# Patient Record
Sex: Female | Born: 1955
Health system: Southern US, Community
[De-identification: ages and names within clinical notes are randomized; demographics above are authoritative.]

## PROBLEM LIST (undated history)

## (undated) DIAGNOSIS — IMO0001 Reserved for inherently not codable concepts without codable children: Secondary | ICD-10-CM

## (undated) DIAGNOSIS — C801 Malignant (primary) neoplasm, unspecified: Secondary | ICD-10-CM

## (undated) DIAGNOSIS — G4700 Insomnia, unspecified: Secondary | ICD-10-CM

## (undated) DIAGNOSIS — E785 Hyperlipidemia, unspecified: Secondary | ICD-10-CM

## (undated) DIAGNOSIS — E039 Hypothyroidism, unspecified: Secondary | ICD-10-CM

## (undated) DIAGNOSIS — I10 Essential (primary) hypertension: Secondary | ICD-10-CM

## (undated) DIAGNOSIS — Z860100 Personal history of colon polyps, unspecified: Secondary | ICD-10-CM

## (undated) DIAGNOSIS — Z923 Personal history of irradiation: Secondary | ICD-10-CM

## (undated) DIAGNOSIS — F329 Major depressive disorder, single episode, unspecified: Secondary | ICD-10-CM

## (undated) DIAGNOSIS — Z531 Procedure and treatment not carried out because of patient's decision for reasons of belief and group pressure: Secondary | ICD-10-CM

## (undated) DIAGNOSIS — M1990 Unspecified osteoarthritis, unspecified site: Secondary | ICD-10-CM

## (undated) DIAGNOSIS — F32A Depression, unspecified: Secondary | ICD-10-CM

## (undated) DIAGNOSIS — F419 Anxiety disorder, unspecified: Secondary | ICD-10-CM

## (undated) HISTORY — PX: PLANTAR FASCIA SURGERY: SHX746

## (undated) HISTORY — PX: TENDON RELEASE: SHX230

## (undated) HISTORY — PX: COLONOSCOPY: SHX174

## (undated) HISTORY — PX: KNEE ARTHROSCOPY: SHX127

## (undated) HISTORY — DX: Hypomagnesemia: E83.42

## (undated) HISTORY — PX: DILATION AND CURETTAGE OF UTERUS: SHX78

## (undated) HISTORY — PX: CHOLECYSTECTOMY: SHX55

---

## 2002-02-02 ENCOUNTER — Other Ambulatory Visit: Admission: RE | Admit: 2002-02-02 | Discharge: 2002-02-02 | Payer: Self-pay | Admitting: Family Medicine

## 2002-02-24 ENCOUNTER — Encounter: Admission: RE | Admit: 2002-02-24 | Discharge: 2002-02-24 | Payer: Self-pay | Admitting: Family Medicine

## 2002-02-24 ENCOUNTER — Encounter: Payer: Self-pay | Admitting: Family Medicine

## 2002-04-27 ENCOUNTER — Encounter (INDEPENDENT_AMBULATORY_CARE_PROVIDER_SITE_OTHER): Payer: Self-pay | Admitting: Specialist

## 2002-04-27 ENCOUNTER — Ambulatory Visit (HOSPITAL_COMMUNITY): Admission: RE | Admit: 2002-04-27 | Discharge: 2002-04-27 | Payer: Self-pay | Admitting: Obstetrics and Gynecology

## 2003-12-08 ENCOUNTER — Ambulatory Visit: Payer: Self-pay | Admitting: Family Medicine

## 2003-12-13 ENCOUNTER — Ambulatory Visit: Payer: Self-pay | Admitting: Family Medicine

## 2003-12-13 ENCOUNTER — Other Ambulatory Visit: Admission: RE | Admit: 2003-12-13 | Discharge: 2003-12-13 | Payer: Self-pay | Admitting: Family Medicine

## 2004-01-21 ENCOUNTER — Ambulatory Visit: Payer: Self-pay | Admitting: Family Medicine

## 2004-06-20 ENCOUNTER — Emergency Department (HOSPITAL_COMMUNITY): Admission: EM | Admit: 2004-06-20 | Discharge: 2004-06-21 | Payer: Self-pay | Admitting: Emergency Medicine

## 2005-09-15 ENCOUNTER — Emergency Department (HOSPITAL_COMMUNITY): Admission: EM | Admit: 2005-09-15 | Discharge: 2005-09-15 | Payer: Self-pay | Admitting: Emergency Medicine

## 2005-09-24 ENCOUNTER — Encounter (INDEPENDENT_AMBULATORY_CARE_PROVIDER_SITE_OTHER): Payer: Self-pay | Admitting: *Deleted

## 2005-09-24 ENCOUNTER — Ambulatory Visit (HOSPITAL_COMMUNITY): Admission: RE | Admit: 2005-09-24 | Discharge: 2005-09-25 | Payer: Self-pay | Admitting: Surgery

## 2005-10-26 ENCOUNTER — Emergency Department (HOSPITAL_COMMUNITY): Admission: EM | Admit: 2005-10-26 | Discharge: 2005-10-26 | Payer: Self-pay | Admitting: Emergency Medicine

## 2006-03-05 ENCOUNTER — Ambulatory Visit: Payer: Self-pay | Admitting: Internal Medicine

## 2006-03-18 ENCOUNTER — Ambulatory Visit: Payer: Self-pay | Admitting: Internal Medicine

## 2006-03-20 ENCOUNTER — Encounter: Admission: RE | Admit: 2006-03-20 | Discharge: 2006-03-20 | Payer: Self-pay | Admitting: Family Medicine

## 2007-11-23 ENCOUNTER — Emergency Department (HOSPITAL_BASED_OUTPATIENT_CLINIC_OR_DEPARTMENT_OTHER): Admission: EM | Admit: 2007-11-23 | Discharge: 2007-11-23 | Payer: Self-pay | Admitting: Emergency Medicine

## 2008-01-12 ENCOUNTER — Emergency Department (HOSPITAL_BASED_OUTPATIENT_CLINIC_OR_DEPARTMENT_OTHER): Admission: EM | Admit: 2008-01-12 | Discharge: 2008-01-12 | Payer: Self-pay | Admitting: Emergency Medicine

## 2008-01-12 ENCOUNTER — Ambulatory Visit: Payer: Self-pay | Admitting: Diagnostic Radiology

## 2008-01-27 ENCOUNTER — Encounter: Admission: RE | Admit: 2008-01-27 | Discharge: 2008-01-27 | Payer: Self-pay | Admitting: Family Medicine

## 2008-08-04 ENCOUNTER — Encounter (INDEPENDENT_AMBULATORY_CARE_PROVIDER_SITE_OTHER): Payer: Self-pay | Admitting: Obstetrics and Gynecology

## 2008-08-04 ENCOUNTER — Ambulatory Visit (HOSPITAL_COMMUNITY): Admission: RE | Admit: 2008-08-04 | Discharge: 2008-08-04 | Payer: Self-pay | Admitting: Obstetrics and Gynecology

## 2009-02-09 ENCOUNTER — Encounter: Admission: RE | Admit: 2009-02-09 | Discharge: 2009-02-09 | Payer: Self-pay | Admitting: Obstetrics and Gynecology

## 2009-08-16 ENCOUNTER — Ambulatory Visit (HOSPITAL_BASED_OUTPATIENT_CLINIC_OR_DEPARTMENT_OTHER): Admission: RE | Admit: 2009-08-16 | Discharge: 2009-08-16 | Payer: Self-pay | Admitting: Orthopedic Surgery

## 2010-04-08 LAB — CBC
MCHC: 34.1 g/dL (ref 30.0–36.0)
Platelets: 195 10*3/uL (ref 150–400)
RBC: 3.73 MIL/uL — ABNORMAL LOW (ref 3.87–5.11)
RDW: 16.1 % — ABNORMAL HIGH (ref 11.5–15.5)

## 2010-04-17 LAB — BASIC METABOLIC PANEL
GFR calc non Af Amer: 47 mL/min — ABNORMAL LOW (ref >60)
Glucose, Bld: 91 mg/dL (ref 70–99)
Potassium: 3.8 mEq/L (ref 3.5–5.1)
Sodium: 136 mEq/L (ref 135–145)

## 2010-04-17 LAB — DIFFERENTIAL
Lymphocytes Relative: 24 % (ref 12–46)
Lymphs Abs: 1.6 10*3/uL (ref 0.7–4.0)
Monocytes Absolute: 0.6 10*3/uL (ref 0.1–1.0)
Monocytes Relative: 8 % (ref 3–12)
Neutro Abs: 4.4 10*3/uL (ref 1.7–7.7)
Neutrophils Relative %: 66 % (ref 43–77)

## 2010-04-17 LAB — CBC
Hemoglobin: 13.2 g/dL (ref 12.0–15.0)
RBC: 4.37 MIL/uL (ref 3.87–5.11)
WBC: 6.7 10*3/uL (ref 4.0–10.5)

## 2010-05-16 NOTE — Op Note (Signed)
NAMESRESHTA, CRESSLER                ACCOUNT NO.:  [id number]   MEDICAL RECORD NO.:  [id number]          PATIENT TYPE:  AMB   LOCATION:  SDC                           FACILITY:  [facility]   PHYSICIAN:  Carrington Clamp, M.D. DATE OF BIRTH:  [birth year]   DATE OF PROCEDURE:  08/04/2008  DATE OF DISCHARGE:                               OPERATIVE REPORT   PREOPERATIVE DIAGNOSIS:  Postmenopausal bleeding.   POSTOPERATIVE DIAGNOSIS:  Postmenopausal bleeding.   PROCEDURES:  Dilation and curettage with hysteroscopy.   SURGEON:  Carrington Clamp, MD   ASSISTANTS:  None.   ANESTHESIA:  General LMA.   FINDINGS:  Intrauterine polyp.   SPECIMENS:  Uterine curettings.   DISPOSITION:  To Pathology.   BLOOD LOSS:  Minimal.   IV FLUIDS:  1500 mL.   URINE OUTPUT:  Not measured.   HYSTEROSCOPY DEFICIT:  Glycine 25 mL.   COMPLICATIONS:  None.   MEDICATIONS:  None.   COUNTS:  Correct x3.   TECHNIQUE:  After adequate LMA anesthesia was achieved, the patient was  prepped and draped in usual sterile fashion in dorsal lithotomy  position.  Bladder was emptied with a red rubber catheter.  Speculum  placed in the vagina.  Single-tooth tenaculum was placed on the cervix,  and the cervix was found to be somewhat stenotic.  Careful entry into  the cervix and dilation was achieved with both soft plastic dilator and  Pratt dilators.  Once the os was opened up to the hysteroscope to pass  through, the hysteroscopy was performed.  The ostia were seen  bilaterally and the above findings noted.   Alternating hysteroscope with sharp with the middle curettes was  performed until the polyp was deemed to be removed.  All instruments  were withdrawn from the vagina, and the patient tolerated the procedure  well and was returned to recovery in stable condition.      Carrington Clamp, M.D.  Electronically Signed     MH/MEDQ  D:  08/04/2008  T:  08/04/2008  Job:  [id number]

## 2010-05-19 NOTE — Op Note (Signed)
NAMESUTTON, PLAKE                ACCOUNT NO.:  [id number]   MEDICAL RECORD NO.:  [id number]          PATIENT TYPE:  OIB   LOCATION:  5705                         FACILITY:  [facility]   PHYSICIAN:  Thornton Park. Daphine Deutscher, MD  DATE OF BIRTH:  1955-12-30   DATE OF PROCEDURE:  09/24/2005  DATE OF DISCHARGE:  09/25/2005                                 OPERATIVE REPORT   PREOPERATIVE DIAGNOSIS:  Cholecystitis.   POSTOPERATIVE DIAGNOSIS:  Cholecystitis.   PROCEDURE:  Laparoscopic cholecystectomy and interoperative cholangiogram.   SURGEON:  Thornton Park. Daphine Deutscher, M.D.   ASSISTANT:  Alfonse Ras, M.D.   ANESTHESIA:  General endotracheal anesthesia.   DRAINS:  None.   FINDINGS:  Greenish chronic cholecystitis with multiple gallstones, normal  interoperative cholangiogram, free intra-abdominal body.   DESCRIPTION OF PROCEDURE:  Ms. Fines was taken to room 16 on Monday evening,  September 24, 2005, and given general anesthesia.  The abdomen was prepped  with chlorhexidine and draped sterilely.  A transverse incision was made  within the umbilicus in an old laparoscopic incision and a longitudinal  incision was made in the fascia which was subsequently closed with a figure-  of-eight at the end of the case but this enabled [place] to get in with the  Hass[place].  The abdomen was inflated. Three trocars were placed in the upper  abdomen.  The gallbladder was grasped and elevated.  It was noted to be  greenish and had a lot of fatty infiltration around it.  I dissected Calot's  triangle free, I took down a lot of fat, I eventually skeletonized the  cystic duct.  I put a clip on the gallbladder, incised the cystic duct, and  did a dynamic cholangiogram which showed intrahepatic filling with free flow  into the duodenum, no evidence of stones, and a reasonably normal anatomy.  The cystic duct was then triply clipped and divided and with that was the  main blood supply to the gallbladder, probably on the  back side.  The  gallbladder was then removed without entering it using hook electrocautery  and just teasing it away.  No bile leaks or bleeding were noted.  The  gallbladder was placed in a bag and brought out with some difficulty through  the umbilicus.  I basically morcellated the gallbladder and got it out with  a bag and without spillage.  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  We went  back and looked at the gallbladder bed and no bleeding or bile leaks were  noted.  I then repaired the umbilical defect with figure-of-eight 0 Vicryl  and then  deflated.  The wounds were closed with 4-0 Vicryl, Benzoin, and Steri-Strips  and were injected with Marcaine.  The patient  seemed to tolerate the  procedure well and was taken to the recovery room in satisfactory addition.  She will be given Tylox for pain and will be followed up in the office in 2-  3 weeks.      Thornton Park Daphine Deutscher, MD  Electronically Signed     MBM/MEDQ  D:  09/24/2005  T:  09/26/2005  Job:  [id number]

## 2010-05-19 NOTE — Op Note (Signed)
   NAME:  Tiffany Yu, Tiffany Yu                          ACCOUNT NO.:  [id number]   MEDICAL RECORD NO.:  [id number]                   PATIENT TYPE:  AMB   LOCATION:  SDC                                  FACILITY:  [facility]   PHYSICIAN:  Carrington Clamp, M.D.              DATE OF BIRTH:  11/23/1955   DATE OF PROCEDURE:  04/27/2002  DATE OF DISCHARGE:                                 OPERATIVE REPORT   PREOPERATIVE DIAGNOSES:  Menorrhagia with anemia.   POSTOPERATIVE DIAGNOSES:  Menorrhagia with anemia.   PROCEDURE:  1. Hysteroscopy.  2. Dilatation and curettage.  3. Cryoablation of endometrium.   ATTENDING:  Carrington Clamp, M.D.   ANESTHESIA:  LMA.   ESTIMATED BLOOD LOSS:  Minimal.   IV FLUIDS:  1200 mL.   URINE OUTPUT:  Not measured.   COMPLICATIONS:  None.   FINDINGS:  Uterus sounded to 12 cm, regular shape.  There were no polyps,  fibroids, or other lesions noted inside the uterus.   MEDICATIONS:  Paracervical block with lidocaine.   PATHOLOGY:  Uterine curettings.   TECHNIQUE:  After adequate LMA anesthesia was achieved patient was prepped  and draped in usual sterile fashion dorsal lithotomy position.  Speculum was  placed in the vaginal vault after the bladder had been emptied and the  cervix grasped with the single tooth tenaculum.  The uterine cervix was  dilated up to allow passage of the hysteroscope which noted the above  findings.  There was minimal fluid deficit during this procedure.  Sharp  curettage was then undertaken with the sharp curette and curettings were  sent to pathology.  The [place] cryo ablation was then proceeded with at  six minutes cryo ablation cycle in each cornu and additional four minute  cryo  ablation procedure performed at 6 cm.  Each cycle was completed and then  heated to allow movement of the probe to the next site.  All instruments  were then withdrawn from the vagina and the patient tolerated the procedure  well.  Returned to  recovery room in stable condition.                                               Carrington Clamp, M.D.    MH/MEDQ  D:  04/27/2002  T:  04/27/2002  Job:  [id number]

## 2010-11-07 ENCOUNTER — Other Ambulatory Visit: Payer: Self-pay | Admitting: Obstetrics and Gynecology

## 2011-10-26 ENCOUNTER — Ambulatory Visit: Payer: Self-pay | Admitting: Licensed Clinical Social Worker

## 2011-10-29 ENCOUNTER — Ambulatory Visit (INDEPENDENT_AMBULATORY_CARE_PROVIDER_SITE_OTHER): Payer: PRIVATE HEALTH INSURANCE | Admitting: Licensed Clinical Social Worker

## 2011-10-29 DIAGNOSIS — F321 Major depressive disorder, single episode, moderate: Secondary | ICD-10-CM

## 2011-11-05 ENCOUNTER — Ambulatory Visit (INDEPENDENT_AMBULATORY_CARE_PROVIDER_SITE_OTHER): Payer: PRIVATE HEALTH INSURANCE | Admitting: Licensed Clinical Social Worker

## 2011-11-05 DIAGNOSIS — F321 Major depressive disorder, single episode, moderate: Secondary | ICD-10-CM

## 2011-11-14 ENCOUNTER — Ambulatory Visit: Payer: Self-pay | Admitting: Licensed Clinical Social Worker

## 2011-11-15 ENCOUNTER — Ambulatory Visit (INDEPENDENT_AMBULATORY_CARE_PROVIDER_SITE_OTHER): Payer: PRIVATE HEALTH INSURANCE | Admitting: Licensed Clinical Social Worker

## 2011-11-15 DIAGNOSIS — F321 Major depressive disorder, single episode, moderate: Secondary | ICD-10-CM

## 2011-11-22 ENCOUNTER — Ambulatory Visit: Payer: PRIVATE HEALTH INSURANCE | Admitting: Licensed Clinical Social Worker

## 2011-11-26 ENCOUNTER — Ambulatory Visit (INDEPENDENT_AMBULATORY_CARE_PROVIDER_SITE_OTHER): Payer: PRIVATE HEALTH INSURANCE | Admitting: Licensed Clinical Social Worker

## 2011-11-26 DIAGNOSIS — F321 Major depressive disorder, single episode, moderate: Secondary | ICD-10-CM

## 2011-12-04 ENCOUNTER — Other Ambulatory Visit: Payer: Self-pay | Admitting: Orthopedic Surgery

## 2011-12-07 ENCOUNTER — Encounter (HOSPITAL_COMMUNITY): Payer: Self-pay | Admitting: Pharmacy Technician

## 2011-12-10 ENCOUNTER — Encounter (HOSPITAL_COMMUNITY)
Admission: RE | Admit: 2011-12-10 | Discharge: 2011-12-10 | Payer: 59 | Source: Ambulatory Visit | Attending: Orthopedic Surgery | Admitting: Orthopedic Surgery

## 2011-12-10 NOTE — Pre-Procedure Instructions (Signed)
20 Tiffany Yu  12/10/2011   Your procedure is scheduled on:  12-14-2011  Report to [facility] at 5:30 AM.  Call this number if you have problems the morning of surgery: [phone]   Remember:   Do not eat food or drink:After Midnight.      Take these medicines the morning of surgery with A SIP OF WATER: cymbalta,lexapro,estradiol,levothyroxine,bystolic      Do not wear jewelry, make-up or nail polish.  Do not wear lotions, powders, or perfumes.  Do not shave 48 hours prior to surgery. .  Do not bring valuables to the hospital.  Contacts, dentures or bridgework may not be worn into surgery.  Leave suitcase in the car. After surgery it may be brought to your room.   For patients admitted to the hospital, checkout time is 11:00 AM the day of discharge.   Patients discharged the day of surgery will not be allowed to drive home.   Special Instructions: Shower using CHG 2 nights before surgery and the night before surgery.  If you shower the day of surgery use CHG.  Use special wash - you have one bottle of CHG for all showers.  You should use approximately 1/3 of the bottle for each shower.   Please read over the following fact sheets that you were given: Pain Booklet, Coughing and Deep Breathing, MRSA Information and Surgical Site Infection Prevention

## 2011-12-11 ENCOUNTER — Ambulatory Visit: Payer: Self-pay | Admitting: Licensed Clinical Social Worker

## 2011-12-13 ENCOUNTER — Encounter (HOSPITAL_COMMUNITY)
Admission: RE | Admit: 2011-12-13 | Discharge: 2011-12-13 | Disposition: A | Discharge status: Home / Self Care | Payer: 59 | Source: Ambulatory Visit | Attending: Orthopedic Surgery | Admitting: Orthopedic Surgery

## 2011-12-13 ENCOUNTER — Encounter (HOSPITAL_COMMUNITY): Payer: Self-pay

## 2011-12-13 HISTORY — DX: Hypothyroidism, unspecified: E03.9

## 2011-12-13 HISTORY — DX: Unspecified osteoarthritis, unspecified site: M19.90

## 2011-12-13 HISTORY — DX: Essential (primary) hypertension: I10

## 2011-12-13 HISTORY — DX: Major depressive disorder, single episode, unspecified: F32.9

## 2011-12-13 HISTORY — DX: Depression, unspecified: F32.A

## 2011-12-13 HISTORY — DX: Anxiety disorder, unspecified: F41.9

## 2011-12-13 LAB — SURGICAL PCR SCREEN
MRSA, PCR: NEGATIVE
Staphylococcus aureus: NEGATIVE

## 2011-12-13 MED ORDER — CEFAZOLIN SODIUM-DEXTROSE 2-3 GM-% IV SOLR
2.0000 g | INTRAVENOUS | Status: AC
  Administered 2011-12-14: 2 g via INTRAVENOUS
  Filled 2011-12-13: qty 50

## 2011-12-13 NOTE — Progress Notes (Signed)
12/13/11 1318  OBSTRUCTIVE SLEEP APNEA  Have you ever been diagnosed with sleep apnea through a sleep study? No  Do you snore loudly (loud enough to be heard through closed doors)?  1  Do you often feel tired, fatigued, or sleepy during the daytime? 1  Has anyone observed you stop breathing during your sleep? 0  Do you have, or are you being treated for high blood pressure? 1  BMI more than 35 kg/m2? 0  Age over 58 years old? 1  Neck circumference greater than 40 cm/18 inches? 0  Gender: 0  Obstructive Sleep Apnea Score 4

## 2011-12-13 NOTE — Progress Notes (Signed)
Pt had labwork at PCP Dr. Everlene Other last week and per pt Darl Pikes at Dr. Luiz Blare' office said everything was done that needed to be done for surgery. Requested labs, last OV and EKG from PCP.

## 2011-12-14 ENCOUNTER — Encounter (HOSPITAL_COMMUNITY): Payer: Self-pay | Admitting: General Practice

## 2011-12-14 ENCOUNTER — Inpatient Hospital Stay (HOSPITAL_COMMUNITY)
Admission: RE | Admit: 2011-12-14 | Discharge: 2011-12-16 | DRG: 470 | Disposition: A | Discharge status: Home / Self Care | Payer: 59 | Source: Ambulatory Visit | Attending: Orthopedic Surgery | Admitting: Orthopedic Surgery

## 2011-12-14 ENCOUNTER — Encounter (HOSPITAL_COMMUNITY): Payer: Self-pay | Admitting: Anesthesiology

## 2011-12-14 ENCOUNTER — Encounter (HOSPITAL_COMMUNITY)
Admission: RE | Disposition: A | Discharge status: Home / Self Care | Payer: Self-pay | Source: Ambulatory Visit | Attending: Orthopedic Surgery

## 2011-12-14 ENCOUNTER — Encounter (HOSPITAL_COMMUNITY): Payer: Self-pay | Admitting: *Deleted

## 2011-12-14 ENCOUNTER — Ambulatory Visit (HOSPITAL_COMMUNITY): Payer: 59 | Admitting: Anesthesiology

## 2011-12-14 DIAGNOSIS — F411 Generalized anxiety disorder: Secondary | ICD-10-CM | POA: Diagnosis present

## 2011-12-14 DIAGNOSIS — M1710 Unilateral primary osteoarthritis, unspecified knee: Principal | ICD-10-CM | POA: Diagnosis present

## 2011-12-14 DIAGNOSIS — F3289 Other specified depressive episodes: Secondary | ICD-10-CM | POA: Diagnosis present

## 2011-12-14 DIAGNOSIS — E039 Hypothyroidism, unspecified: Secondary | ICD-10-CM | POA: Diagnosis present

## 2011-12-14 DIAGNOSIS — I10 Essential (primary) hypertension: Secondary | ICD-10-CM | POA: Diagnosis present

## 2011-12-14 DIAGNOSIS — F329 Major depressive disorder, single episode, unspecified: Secondary | ICD-10-CM | POA: Diagnosis present

## 2011-12-14 DIAGNOSIS — M1711 Unilateral primary osteoarthritis, right knee: Secondary | ICD-10-CM | POA: Diagnosis present

## 2011-12-14 HISTORY — DX: Procedure and treatment not carried out because of patient's decision for reasons of belief and group pressure: Z53.1

## 2011-12-14 HISTORY — PX: TOTAL KNEE ARTHROPLASTY: SHX125

## 2011-12-14 HISTORY — DX: Reserved for inherently not codable concepts without codable children: IMO0001

## 2011-12-14 HISTORY — PX: KNEE ARTHROPLASTY: SHX992

## 2011-12-14 LAB — PROTIME-INR: INR: 1.15 (ref 0.00–1.49)

## 2011-12-14 SURGERY — ARTHROPLASTY, KNEE, TOTAL, USING IMAGELESS COMPUTER-ASSISTED NAVIGATION
Anesthesia: Regional | Site: Knee | Laterality: Right | Wound class: Clean

## 2011-12-14 MED ORDER — MIDAZOLAM HCL 2 MG/2ML IJ SOLN
INTRAMUSCULAR | Status: AC
  Administered 2011-12-14: 2 mg via INTRAVENOUS
  Filled 2011-12-14: qty 2

## 2011-12-14 MED ORDER — FENTANYL CITRATE 0.05 MG/ML IJ SOLN
INTRAMUSCULAR | Status: AC
  Filled 2011-12-14: qty 2

## 2011-12-14 MED ORDER — PHENYLEPHRINE HCL 10 MG/ML IJ SOLN
INTRAMUSCULAR | Status: DC | PRN
  Administered 2011-12-14 (×3): 80 ug via INTRAVENOUS

## 2011-12-14 MED ORDER — POVIDONE-IODINE 7.5 % EX SOLN: Freq: Once | CUTANEOUS | Status: DC

## 2011-12-14 MED ORDER — PROMETHAZINE HCL 25 MG/ML IJ SOLN: 6.2500 mg | INTRAMUSCULAR | Status: DC | PRN

## 2011-12-14 MED ORDER — LACTATED RINGERS IV SOLN
INTRAVENOUS | Status: DC | PRN
  Administered 2011-12-14 (×2): via INTRAVENOUS

## 2011-12-14 MED ORDER — ONDANSETRON HCL 4 MG/2ML IJ SOLN
INTRAMUSCULAR | Status: DC | PRN
  Administered 2011-12-14: 4 mg via INTRAVENOUS

## 2011-12-14 MED ORDER — DOCUSATE SODIUM 100 MG PO CAPS
100.0000 mg | ORAL_CAPSULE | Freq: Two times a day (BID) | ORAL | Status: DC
  Administered 2011-12-14 – 2011-12-16 (×4): 100 mg via ORAL
  Filled 2011-12-14 (×4): qty 1

## 2011-12-14 MED ORDER — NEBIVOLOL HCL 5 MG PO TABS
5.0000 mg | ORAL_TABLET | Freq: Every day | ORAL | Status: DC
  Administered 2011-12-15 – 2011-12-16 (×2): 5 mg via ORAL
  Filled 2011-12-14 (×2): qty 1

## 2011-12-14 MED ORDER — BUPIVACAINE-EPINEPHRINE PF 0.5-1:200000 % IJ SOLN
INTRAMUSCULAR | Status: DC | PRN
  Administered 2011-12-14: 150 mg

## 2011-12-14 MED ORDER — OXYCODONE HCL 5 MG PO TABS
5.0000 mg | ORAL_TABLET | Freq: Once | ORAL | Status: AC | PRN
  Administered 2011-12-14: 5 mg via ORAL

## 2011-12-14 MED ORDER — CEFAZOLIN SODIUM-DEXTROSE 2-3 GM-% IV SOLR
2.0000 g | Freq: Four times a day (QID) | INTRAVENOUS | Status: AC
  Administered 2011-12-14 (×2): 2 g via INTRAVENOUS
  Filled 2011-12-14 (×2): qty 50

## 2011-12-14 MED ORDER — FERROUS SULFATE 325 (65 FE) MG PO TABS
325.0000 mg | ORAL_TABLET | Freq: Two times a day (BID) | ORAL | Status: DC
  Administered 2011-12-14 – 2011-12-16 (×4): 325 mg via ORAL
  Filled 2011-12-14 (×7): qty 1

## 2011-12-14 MED ORDER — ONDANSETRON HCL 4 MG PO TABS: 4.0000 mg | ORAL_TABLET | Freq: Four times a day (QID) | ORAL | Status: DC | PRN

## 2011-12-14 MED ORDER — HYDROMORPHONE HCL PF 1 MG/ML IJ SOLN
INTRAMUSCULAR | Status: AC
  Filled 2011-12-14: qty 1

## 2011-12-14 MED ORDER — ACETAMINOPHEN 10 MG/ML IV SOLN
INTRAVENOUS | Status: AC
  Filled 2011-12-14: qty 100

## 2011-12-14 MED ORDER — LEVOTHYROXINE SODIUM 50 MCG PO TABS
50.0000 ug | ORAL_TABLET | Freq: Every day | ORAL | Status: DC
  Administered 2011-12-15 – 2011-12-16 (×2): 50 ug via ORAL
  Filled 2011-12-14 (×3): qty 1

## 2011-12-14 MED ORDER — OXYCODONE HCL 5 MG PO TABS
5.0000 mg | ORAL_TABLET | ORAL | Status: DC | PRN
  Administered 2011-12-15 – 2011-12-16 (×9): 10 mg via ORAL
  Filled 2011-12-14 (×9): qty 2

## 2011-12-14 MED ORDER — DEXAMETHASONE SODIUM PHOSPHATE 4 MG/ML IJ SOLN
INTRAMUSCULAR | Status: DC | PRN
  Administered 2011-12-14: 8 mg via INTRAVENOUS
  Administered 2011-12-14: 10 mg

## 2011-12-14 MED ORDER — ESTRADIOL 1 MG PO TABS
1.0000 mg | ORAL_TABLET | Freq: Every day | ORAL | Status: DC
  Administered 2011-12-15 – 2011-12-16 (×2): 1 mg via ORAL
  Filled 2011-12-14 (×2): qty 1

## 2011-12-14 MED ORDER — DEXTROSE-NACL 5-0.45 % IV SOLN
INTRAVENOUS | Status: DC
  Administered 2011-12-14 – 2011-12-15 (×2): via INTRAVENOUS

## 2011-12-14 MED ORDER — MIDAZOLAM HCL 2 MG/2ML IJ SOLN: 0.5000 mg | Freq: Once | INTRAMUSCULAR | Status: DC | PRN

## 2011-12-14 MED ORDER — OXYCODONE HCL 5 MG PO TABS
ORAL_TABLET | ORAL | Status: AC
  Filled 2011-12-14: qty 1

## 2011-12-14 MED ORDER — FENTANYL CITRATE 0.05 MG/ML IJ SOLN
INTRAMUSCULAR | Status: DC | PRN
  Administered 2011-12-14 (×2): 100 ug via INTRAVENOUS
  Administered 2011-12-14: 50 ug via INTRAVENOUS

## 2011-12-14 MED ORDER — ACETAMINOPHEN 10 MG/ML IV SOLN
1000.0000 mg | Freq: Once | INTRAVENOUS | Status: AC
  Administered 2011-12-14: 1000 mg via INTRAVENOUS

## 2011-12-14 MED ORDER — DULOXETINE HCL 60 MG PO CPEP
60.0000 mg | ORAL_CAPSULE | Freq: Every day | ORAL | Status: DC
  Administered 2011-12-15 – 2011-12-16 (×2): 60 mg via ORAL
  Filled 2011-12-14 (×2): qty 1

## 2011-12-14 MED ORDER — ZOLPIDEM TARTRATE 5 MG PO TABS
5.0000 mg | ORAL_TABLET | Freq: Every evening | ORAL | Status: DC | PRN
  Administered 2011-12-15: 5 mg via ORAL
  Filled 2011-12-14: qty 1

## 2011-12-14 MED ORDER — WARFARIN - PHARMACIST DOSING INPATIENT: Freq: Every day | Status: DC

## 2011-12-14 MED ORDER — METOCLOPRAMIDE HCL 10 MG PO TABS
5.0000 mg | ORAL_TABLET | Freq: Three times a day (TID) | ORAL | Status: DC | PRN
  Filled 2011-12-14: qty 1

## 2011-12-14 MED ORDER — ONDANSETRON HCL 4 MG/2ML IJ SOLN: 4.0000 mg | Freq: Four times a day (QID) | INTRAMUSCULAR | Status: DC | PRN

## 2011-12-14 MED ORDER — PROPOFOL 10 MG/ML IV BOLUS
INTRAVENOUS | Status: DC | PRN
  Administered 2011-12-14: 120 mg via INTRAVENOUS

## 2011-12-14 MED ORDER — ESCITALOPRAM OXALATE 10 MG PO TABS
10.0000 mg | ORAL_TABLET | Freq: Every day | ORAL | Status: DC
Start: 2011-12-15 — End: 2011-12-16
  Administered 2011-12-15 – 2011-12-16 (×2): 10 mg via ORAL
  Filled 2011-12-14 (×2): qty 1

## 2011-12-14 MED ORDER — WARFARIN SODIUM 7.5 MG PO TABS
7.5000 mg | ORAL_TABLET | Freq: Once | ORAL | Status: AC
  Administered 2011-12-14: 7.5 mg via ORAL
  Filled 2011-12-14: qty 1

## 2011-12-14 MED ORDER — METHOCARBAMOL 100 MG/ML IJ SOLN
500.0000 mg | Freq: Four times a day (QID) | INTRAMUSCULAR | Status: DC | PRN
  Filled 2011-12-14: qty 5

## 2011-12-14 MED ORDER — ACETAMINOPHEN 10 MG/ML IV SOLN
1000.0000 mg | Freq: Four times a day (QID) | INTRAVENOUS | Status: AC
  Administered 2011-12-14 – 2011-12-15 (×4): 1000 mg via INTRAVENOUS
  Filled 2011-12-14 (×4): qty 100

## 2011-12-14 MED ORDER — DIPHENHYDRAMINE HCL 12.5 MG/5ML PO ELIX: 12.5000 mg | ORAL_SOLUTION | ORAL | Status: DC | PRN

## 2011-12-14 MED ORDER — METHOCARBAMOL 500 MG PO TABS
500.0000 mg | ORAL_TABLET | Freq: Four times a day (QID) | ORAL | Status: DC | PRN
  Administered 2011-12-15 – 2011-12-16 (×4): 500 mg via ORAL
  Filled 2011-12-14 (×4): qty 1

## 2011-12-14 MED ORDER — ROCURONIUM BROMIDE 100 MG/10ML IV SOLN
INTRAVENOUS | Status: DC | PRN
  Administered 2011-12-14: 50 mg via INTRAVENOUS

## 2011-12-14 MED ORDER — WARFARIN VIDEO
Freq: Once | Status: AC
  Administered 2011-12-15: 10:00:00

## 2011-12-14 MED ORDER — GLYCOPYRROLATE 0.2 MG/ML IJ SOLN
INTRAMUSCULAR | Status: DC | PRN
  Administered 2011-12-14: 0.4 mg via INTRAVENOUS

## 2011-12-14 MED ORDER — ZOLPIDEM TARTRATE 5 MG PO TABS: 10.0000 mg | ORAL_TABLET | Freq: Every evening | ORAL | Status: DC | PRN

## 2011-12-14 MED ORDER — SODIUM CHLORIDE 0.9 % IR SOLN
Status: DC | PRN
  Administered 2011-12-14: 3000 mL
  Administered 2011-12-14: 1000 mL

## 2011-12-14 MED ORDER — NEOSTIGMINE METHYLSULFATE 1 MG/ML IJ SOLN
INTRAMUSCULAR | Status: DC | PRN
  Administered 2011-12-14: 3 mg via INTRAVENOUS

## 2011-12-14 MED ORDER — OXYCODONE HCL 5 MG/5ML PO SOLN: 5.0000 mg | Freq: Once | ORAL | Status: AC | PRN

## 2011-12-14 MED ORDER — ALUM & MAG HYDROXIDE-SIMETH 200-200-20 MG/5ML PO SUSP: 30.0000 mL | ORAL | Status: DC | PRN

## 2011-12-14 MED ORDER — MEPERIDINE HCL 25 MG/ML IJ SOLN: 6.2500 mg | INTRAMUSCULAR | Status: DC | PRN

## 2011-12-14 MED ORDER — HYDROMORPHONE HCL PF 1 MG/ML IJ SOLN
1.0000 mg | INTRAMUSCULAR | Status: DC | PRN
  Administered 2011-12-14 – 2011-12-15 (×3): 1 mg via INTRAVENOUS
  Filled 2011-12-14 (×2): qty 1

## 2011-12-14 MED ORDER — HYDROMORPHONE HCL PF 1 MG/ML IJ SOLN
0.2500 mg | INTRAMUSCULAR | Status: DC | PRN
  Administered 2011-12-14 (×4): 0.5 mg via INTRAVENOUS

## 2011-12-14 MED ORDER — LIDOCAINE HCL (CARDIAC) 20 MG/ML IV SOLN
INTRAVENOUS | Status: DC | PRN
  Administered 2011-12-14: 30 mg via INTRAVENOUS

## 2011-12-14 MED ORDER — METOCLOPRAMIDE HCL 5 MG/ML IJ SOLN: 5.0000 mg | Freq: Three times a day (TID) | INTRAMUSCULAR | Status: DC | PRN

## 2011-12-14 MED ORDER — CEFUROXIME SODIUM 1.5 G IJ SOLR
INTRAMUSCULAR | Status: DC | PRN
  Administered 2011-12-14: 1.5 g

## 2011-12-14 MED ORDER — CEFUROXIME SODIUM 1.5 G IJ SOLR
INTRAMUSCULAR | Status: AC
  Filled 2011-12-14: qty 1.5

## 2011-12-14 MED ORDER — PATIENT'S GUIDE TO USING COUMADIN BOOK
Freq: Once | Status: AC
  Administered 2011-12-14: 15:00:00
  Filled 2011-12-14: qty 1

## 2011-12-14 SURGICAL SUPPLY — 72 items
BANDAGE ESMARK 6X9 LF (GAUZE/BANDAGES/DRESSINGS) ×1 IMPLANT
BENZOIN TINCTURE PRP APPL 2/3 (GAUZE/BANDAGES/DRESSINGS) IMPLANT
BLADE SAGITTAL 25.0X1.19X90 (BLADE) ×2 IMPLANT
BLADE SAW SAG 90X13X1.27 (BLADE) ×2 IMPLANT
BNDG ESMARK 6X9 LF (GAUZE/BANDAGES/DRESSINGS) ×2
BOWL SMART MIX CTS (DISPOSABLE) ×2 IMPLANT
CEMENT HV SMART SET (Cement) ×4 IMPLANT
CLOTH BEACON ORANGE TIMEOUT ST (SAFETY) ×2 IMPLANT
COVER BACK TABLE 24X17X13 BIG (DRAPES) IMPLANT
COVER SURGICAL LIGHT HANDLE (MISCELLANEOUS) ×2 IMPLANT
CUFF TOURNIQUET SINGLE 34IN LL (TOURNIQUET CUFF) ×2 IMPLANT
CUFF TOURNIQUET SINGLE 44IN (TOURNIQUET CUFF) IMPLANT
DRAPE EXTREMITY T 121X128X90 (DRAPE) ×2 IMPLANT
DRAPE U-SHAPE 47X51 STRL (DRAPES) ×2 IMPLANT
DRSG PAD ABDOMINAL 8X10 ST (GAUZE/BANDAGES/DRESSINGS) ×2 IMPLANT
DURAPREP 26ML APPLICATOR (WOUND CARE) ×2 IMPLANT
ELECT REM PT RETURN 9FT ADLT (ELECTROSURGICAL) ×2
ELECTRODE REM PT RTRN 9FT ADLT (ELECTROSURGICAL) ×1 IMPLANT
EVACUATOR 1/8 PVC DRAIN (DRAIN) ×2 IMPLANT
FACESHIELD LNG OPTICON STERILE (SAFETY) ×4 IMPLANT
GAUZE XEROFORM 5X9 LF (GAUZE/BANDAGES/DRESSINGS) ×2 IMPLANT
GLOVE BIO SURGEON STRL SZ7.5 (GLOVE) ×4 IMPLANT
GLOVE BIO SURGEON STRL SZ8.5 (GLOVE) ×2 IMPLANT
GLOVE BIOGEL PI IND STRL 8 (GLOVE) ×2 IMPLANT
GLOVE BIOGEL PI INDICATOR 8 (GLOVE) ×2
GLOVE BIOGEL PI ORTHO PRO 7.5 (GLOVE) ×1
GLOVE ECLIPSE 7.5 STRL STRAW (GLOVE) ×4 IMPLANT
GLOVE PI ORTHO PRO STRL 7.5 (GLOVE) ×1 IMPLANT
GLOVE SURG SS PI 8.5 STRL IVOR (GLOVE) ×1
GLOVE SURG SS PI 8.5 STRL STRW (GLOVE) ×1 IMPLANT
GOWN PREVENTION PLUS XLARGE (GOWN DISPOSABLE) ×2 IMPLANT
GOWN SRG XL XLNG 56XLVL 4 (GOWN DISPOSABLE) ×2 IMPLANT
GOWN STRL NON-REIN LRG LVL3 (GOWN DISPOSABLE) IMPLANT
GOWN STRL NON-REIN XL XLG LVL4 (GOWN DISPOSABLE) ×2
GOWN STRL REIN 2XL XLG LVL4 (GOWN DISPOSABLE) ×2 IMPLANT
HANDPIECE INTERPULSE COAX TIP (DISPOSABLE) ×1
HOOD PEEL AWAY FACE SHEILD DIS (HOOD) ×4 IMPLANT
IMMOBILIZER KNEE 20 (SOFTGOODS)
IMMOBILIZER KNEE 20 THIGH 36 (SOFTGOODS) IMPLANT
IMMOBILIZER KNEE 22 UNIV (SOFTGOODS) ×2 IMPLANT
IMMOBILIZER KNEE 24 THIGH 36 (MISCELLANEOUS) IMPLANT
IMMOBILIZER KNEE 24 UNIV (MISCELLANEOUS)
KIT BASIN OR (CUSTOM PROCEDURE TRAY) ×2 IMPLANT
KIT ROOM TURNOVER OR (KITS) ×2 IMPLANT
MANIFOLD NEPTUNE II (INSTRUMENTS) ×2 IMPLANT
MARKER SPHERE PSV REFLC THRD 5 (MARKER) ×6 IMPLANT
NEEDLE HYPO 25GX1X1/2 BEV (NEEDLE) IMPLANT
NS IRRIG 1000ML POUR BTL (IV SOLUTION) ×2 IMPLANT
PACK TOTAL JOINT (CUSTOM PROCEDURE TRAY) ×2 IMPLANT
PAD ARMBOARD 7.5X6 YLW CONV (MISCELLANEOUS) ×2 IMPLANT
PAD CAST 4YDX4 CTTN HI CHSV (CAST SUPPLIES) ×1 IMPLANT
PADDING CAST COTTON 4X4 STRL (CAST SUPPLIES) ×1
PADDING CAST COTTON 6X4 STRL (CAST SUPPLIES) ×2 IMPLANT
PIN SCHANZ 4MM 130MM (PIN) ×8 IMPLANT
SET HNDPC FAN SPRY TIP SCT (DISPOSABLE) ×1 IMPLANT
SPONGE GAUZE 4X4 12PLY (GAUZE/BANDAGES/DRESSINGS) ×2 IMPLANT
STAPLER VISISTAT 35W (STAPLE) ×2 IMPLANT
STRIP CLOSURE SKIN 1/2X4 (GAUZE/BANDAGES/DRESSINGS) ×2 IMPLANT
SUCTION FRAZIER TIP 10 FR DISP (SUCTIONS) ×2 IMPLANT
SUT BONE WAX W31G (SUTURE) ×2 IMPLANT
SUT MNCRL AB 3-0 PS2 18 (SUTURE) ×2 IMPLANT
SUT MON AB 3-0 SH 27 (SUTURE)
SUT MON AB 3-0 SH27 (SUTURE) IMPLANT
SUT VIC AB 0 CTB1 27 (SUTURE) ×4 IMPLANT
SUT VIC AB 1 CT1 27 (SUTURE) ×3
SUT VIC AB 1 CT1 27XBRD ANBCTR (SUTURE) ×3 IMPLANT
SUT VIC AB 2-0 CTB1 (SUTURE) ×4 IMPLANT
SYR CONTROL 10ML LL (SYRINGE) IMPLANT
TOWEL OR 17X24 6PK STRL BLUE (TOWEL DISPOSABLE) ×2 IMPLANT
TOWEL OR 17X26 10 PK STRL BLUE (TOWEL DISPOSABLE) ×2 IMPLANT
TRAY FOLEY CATH 14FR (SET/KITS/TRAYS/PACK) ×2 IMPLANT
WATER STERILE IRR 1000ML POUR (IV SOLUTION) ×2 IMPLANT

## 2011-12-14 NOTE — H&P (Signed)
TOTAL KNEE ADMISSION H&P  Patient is being admitted for right total knee arthroplasty.  Subjective:  Chief Complaint:right knee pain.  HPI: Tiffany Yu, 56 y.o. female, has a history of pain and functional disability in the right knee due to arthritis and has failed non-surgical conservative treatments for greater than 12 weeks to includeNSAID's and/or analgesics, corticosteriod injections, viscosupplementation injections and activity modification.  Onset of symptoms was gradual, starting 5 years ago with gradually worsening course since that time. The patient noted prior procedures on the knee to include  arthroscopy and menisectomy on the right knee(s).  Patient currently rates pain in the right knee(s) at 8 out of 10 with activity. Patient has night pain, worsening of pain with activity and weight bearing, pain that interferes with activities of daily living and crepitus.  Patient has evidence of subchondral sclerosis and joint space narrowing by imaging studies. This patient has had conservative care. There is no active infection.  There are no active problems to display for this patient.  Past Medical History  Diagnosis Date  . Hypertension   . Hypothyroidism   . Anxiety   . Depression   . Arthritis     Past Surgical History  Procedure Date  . Cholecystectomy   . Tendon release     right wrist  . Knee arthroscopy     Right  . Plantar fascia surgery     right foot  . Dilation and curettage of uterus     Prescriptions prior to admission  Medication Sig Dispense Refill  . DULoxetine (CYMBALTA) 30 MG capsule Take 60 mg by mouth daily.      Marland Kitchen escitalopram (LEXAPRO) 10 MG tablet Take 10 mg by mouth daily.      Marland Kitchen estradiol (ESTRACE) 1 MG tablet Take 1 mg by mouth daily.      Marland Kitchen levothyroxine (SYNTHROID, LEVOTHROID) 50 MCG tablet Take 50 mcg by mouth daily.      . nebivolol (BYSTOLIC) 5 MG tablet Take 5 mg by mouth daily.      Marland Kitchen zolpidem (AMBIEN) 10 MG tablet Take 10 mg by mouth  at bedtime as needed. For sleep.       No Known Allergies  History  Substance Use Topics  . Smoking status: Never Smoker   . Smokeless tobacco: Never Used  . Alcohol Use: Yes     Comment: very rarely    History reviewed. No pertinent family history.   ROS  Objective:  Physical Exam  Vital signs in last 24 hours: Temp:  [98 F (36.7 C)-98.7 F (37.1 C)] 98 F (36.7 C) (12/13 0618) Pulse Rate:  [74] 74  (12/13 0618) Resp:  [16-20] 16  (12/13 0618) BP: (148-185)/(80-85) 148/85 mmHg (12/13 0618) SpO2:  [97 %-100 %] 100 % (12/13 0618) Weight:  [102.1 kg (225 lb 1.4 oz)] 102.1 kg (225 lb 1.4 oz) (12/12 1305)  Labs:   There is no height or weight on file to calculate BMI.   Imaging Review Plain radiographs demonstrate severe degenerative joint disease of the right knee(s). The overall alignment ismild varus. The bone quality appears to be good for age and reported activity level.  Assessment/Plan:  End stage arthritis, right knee   The patient history, physical examination, clinical judgment of the provider and imaging studies are consistent with end stage degenerative joint disease of the right knee(s) and total knee arthroplasty is deemed medically necessary. The treatment options including medical management, injection therapy arthroscopy and arthroplasty were discussed at  length. The risks and benefits of total knee arthroplasty were presented and reviewed. The risks due to aseptic loosening, infection, stiffness, patella tracking problems, thromboembolic complications and other imponderables were discussed. The patient acknowledged the explanation, agreed to proceed with the plan and consent was signed. Patient is being admitted for inpatient treatment for surgery, pain control, PT, OT, prophylactic antibiotics, VTE prophylaxis, progressive ambulation and ADL's and discharge planning. The patient is planning to be discharged home with home health services

## 2011-12-14 NOTE — Anesthesia Preprocedure Evaluation (Addendum)
Anesthesia Evaluation  Patient identified by MRN, date of birth, ID band Patient awake    Reviewed: Allergy & Precautions, H&P , NPO status , Patient's Chart, lab work & pertinent test results, reviewed documented beta blocker date and time   History of Anesthesia Complications Negative for: history of anesthetic complications  Airway Mallampati: I TM Distance: >3 FB Neck ROM: Full    Dental  (+) Edentulous Upper and Edentulous Lower   Pulmonary  cxr - NAD breath sounds clear to auscultation  Pulmonary exam normal       Cardiovascular hypertension, Pt. on medications and Pt. on home beta blockers Rhythm:Regular Rate:Normal  EKG- NSR , denies any cardio pulmonary symptoms   Neuro/Psych PSYCHIATRIC DISORDERS Anxiety Depression negative neurological ROS     GI/Hepatic negative GI ROS, Neg liver ROS,   Endo/Other  Hypothyroidism Morbid obesity  Renal/GU negative Renal ROS     Musculoskeletal  (+) Arthritis -, Osteoarthritis,    Abdominal (+) + obese,   Peds  Hematology  (+) REFUSES BLOOD PRODUCTS, JEHOVAH'S WITNESS  Anesthesia Other Findings   Reproductive/Obstetrics                         Anesthesia Physical Anesthesia Plan  ASA: III  Anesthesia Plan: General and Regional   Post-op Pain Management: MAC Combined w/ Regional for Post-op pain   Induction: Intravenous  Airway Management Planned: Oral ETT  Additional Equipment:   Intra-op Plan:   Post-operative Plan: Extubation in OR  Informed Consent: I have reviewed the patients History and Physical, chart, labs and discussed the procedure including the risks, benefits and alternatives for the proposed anesthesia with the patient or authorized representative who has indicated his/her understanding and acceptance.     Plan Discussed with: CRNA, Anesthesiologist and Surgeon  Anesthesia Plan Comments: (Pt jehovah's witness no blood  products under any circumstance, will accept albumin Plan routine monitors, GETA with femoral nerve block for post op analgesia)       Anesthesia Quick Evaluation

## 2011-12-14 NOTE — Progress Notes (Signed)
Labs drawn on 12/04/11, EKG are inside the chart....patient refuses blood....refusal in chart....da

## 2011-12-14 NOTE — Evaluation (Signed)
Physical Therapy Evaluation Patient Details Name: Tiffany Yu MRN: [id number] DOB: February 11, 1955 Today's Date: 12/14/2011 Time: 4098-1191 PT Time Calculation (min): 26 min  PT Assessment / Plan / Recommendation Clinical Impression  Pt s/p right TKA. Pt moving well, still limited secondary to decreased sensation and RLE strength. Pt will benefit from skilled PT in the acute care setting in order to maximize functional mobility and strength prior to d/c home    PT Assessment  Patient needs continued PT services    Follow Up Recommendations  Home health PT;Supervision/Assistance - 24 hour    Does the patient have the potential to tolerate intense rehabilitation      Barriers to Discharge        Equipment Recommendations  None recommended by PT    Recommendations for Other Services     Frequency 7X/week    Precautions / Restrictions Precautions Precautions: Knee Required Braces or Orthoses: Knee Immobilizer - Right Knee Immobilizer - Right: On when out of bed or walking Restrictions Weight Bearing Restrictions: Yes RLE Weight Bearing: Weight bearing as tolerated   Pertinent Vitals/Pain Pain 4/10      Mobility  Bed Mobility Bed Mobility: Supine to Sit;Sitting - Scoot to Edge of Bed Supine to Sit: 4: Min assist Sitting - Scoot to [place] of Bed: 4: Min assist Details for Bed Mobility Assistance: Min assist with RLE. Pt using rails to assist with transfer. Cues for sequencing Transfers Transfers: Stand to Sit;Sit to Stand;Stand Pivot Transfers Sit to Stand: 4: Min assist;With upper extremity assist;From bed Stand to Sit: 4: Min assist;With upper extremity assist;To chair/3-in-1 Stand Pivot Transfers: 4: Min assist Details for Transfer Assistance: Min assist for stability and control of RLE. Pt able to take minimal steps from bed to chair. Cues for proper hand placement and sequencing as well as assist of RLE in standing and sitting Ambulation/Gait Ambulation/Gait  Assistance: Not tested (comment)    Shoulder Instructions     Exercises Total Joint Exercises Ankle Circles/Pumps: AROM;Strengthening;Both;10 reps;Supine Quad Sets: AROM;Strengthening;Right;10 reps;Supine   PT Diagnosis: Difficulty walking;Acute pain  PT Problem List: Decreased strength;Decreased range of motion;Decreased activity tolerance;Decreased mobility;Decreased knowledge of use of DME;Decreased safety awareness;Decreased knowledge of precautions;Pain PT Treatment Interventions: DME instruction;Gait training;Stair training;Functional mobility training;Therapeutic activities;Therapeutic exercise;Patient/family education   PT Goals Acute Rehab PT Goals PT Goal Formulation: With patient Time For Goal Achievement: 12/21/11 Potential to Achieve Goals: Good Pt will go Supine/Side to Sit: with modified independence PT Goal: Supine/Side to Sit - Progress: Goal set today Pt will go Sit to Supine/Side: with modified independence PT Goal: Sit to Supine/Side - Progress: Goal set today Pt will go Sit to Stand: with modified independence PT Goal: Sit to Stand - Progress: Goal set today Pt will go Stand to Sit: with modified independence PT Goal: Stand to Sit - Progress: Goal set today Pt will Transfer Bed to Chair/Chair to Bed: with supervision PT Transfer Goal: Bed to Chair/Chair to Bed - Progress: Goal set today Pt will Ambulate: >150 feet;with supervision;with least restrictive assistive device PT Goal: Ambulate - Progress: Goal set today Pt will Go Up / Down Stairs: Flight;with supervision;with rail(s) PT Goal: Up/Down Stairs - Progress: Goal set today Pt will Perform Home Exercise Program: Independently PT Goal: Perform Home Exercise Program - Progress: Goal set today  Visit Information  Last PT Received On: 12/14/11 Assistance Needed: +1    Subjective Data  Patient Stated Goal: to go home   Prior Functioning  Home Living Lives With: Spouse  Available Help at Discharge:  Family;Available 24 hours/day Type of Home: Apartment Home Access: Stairs to enter [employer] of Steps: 18 Entrance Stairs-Rails: Right;Left;Can reach both Home Layout: One level Bathroom Shower/Tub: Forensic scientist: Standard Bathroom Accessibility: Yes How Accessible: Accessible via walker Home Adaptive Equipment: Straight cane;Walker - rolling Prior Function Level of Independence: Independent Able to Take Stairs?: Yes Driving: Yes Vocation: Full time employment Comments: Emergency planning/management officer Communication: No difficulties Dominant Hand: Right    Cognition  Overall Cognitive Status: Appears within functional limits for tasks assessed/performed Arousal/Alertness: Awake/alert Orientation Level: Appears intact for tasks assessed Behavior During Session: [facility] for tasks performed    Extremity/Trunk Assessment Right Lower Extremity Assessment RLE ROM/Strength/Tone: Unable to fully assess;Due to pain;Deficits RLE ROM/Strength/Tone Deficits: Pt able to complete quad set. Hip and Ankle WFL RLE Sensation: Deficits RLE Sensation Deficits: decreased sensation in entire RLE Left Lower Extremity Assessment LLE ROM/Strength/Tone: Within functional levels LLE Sensation: WFL - Light Touch   Balance    End of Session PT - End of Session Equipment Utilized During Treatment: Gait belt;Right knee immobilizer Activity Tolerance: Patient tolerated treatment well Patient left: in chair;with call bell/phone within reach;with family/visitor present Nurse Communication: Mobility status CPM Right Knee CPM Right Knee: Off  GP     Milana Kidney 12/14/2011, 5:53 PM  12/14/2011 Milana Kidney DPT PAGER: [phone] OFFICE: [phone]

## 2011-12-14 NOTE — Brief Op Note (Signed)
12/14/2011  11:39 AM  PATIENT:  Tiffany Yu  56 y.o. female  PRE-OPERATIVE DIAGNOSIS:  DEGENERATIVE JOINT DISEASE, right knee  POST-OPERATIVE DIAGNOSIS:  DEGENERATIVE JOINT DISEASE, right knee  PROCEDURE:  Procedure(s) (LRB) with comments: COMPUTER ASSISTED TOTAL KNEE ARTHROPLASTY (Right)  SURGEON:  Surgeon(s) and Role:    * Harvie Junior, MD - Primary  PHYSICIAN ASSISTANT:   ASSISTANTS: bethune   ANESTHESIA:   general  EBL:  Total I/O In: 1400 [I.V.:1400] Out: 850 [Urine:750; Blood:100]  BLOOD ADMINISTERED:none  DRAINS: (1) Hemovact drain(s) in the r knee with  Suction Open   LOCAL MEDICATIONS USED:  MARCAINE     SPECIMEN:  No Specimen  DISPOSITION OF SPECIMEN:  N/A  COUNTS:  YES  TOURNIQUET:   Total Tourniquet Time Documented: Thigh (Right) - 71 minutes  DICTATION: .Other Dictation: Dictation Number [id number]  PLAN OF CARE: Admit to inpatient   PATIENT DISPOSITION:  PACU - hemodynamically stable.   Delay start of Pharmacological VTE agent (>24hrs) due to surgical blood loss or risk of bleeding: no

## 2011-12-14 NOTE — Anesthesia Procedure Notes (Signed)
Anesthesia Regional Block:  Femoral nerve block  Pre-Anesthetic Checklist: ,, timeout performed, Correct Patient, Correct Site, Correct Laterality, Correct Procedure, Correct Position, site marked, Risks and benefits discussed,  Surgical consent,  Pre-op evaluation,  At surgeon's request and post-op pain management  Laterality: Right  Prep: chloraprep       Needles:  Injection technique: Single-shot  Needle Type: Echogenic Stimulator Needle     Needle Length: 5cm 5 cm Needle Gauge: 22 and 22 G    Additional Needles:  Procedures: ultrasound guided (picture in chart) and nerve stimulator Femoral nerve block  Nerve Stimulator or Paresthesia:  Response: quadraceps contraction, 0.45 mA,   Additional Responses:   Narrative:  Start time: 12/14/2011 7:00 AM End time: 12/14/2011 7:10 AM Injection made incrementally with aspirations every 5 mL.  Performed by: Personally  Anesthesiologist: Halford Decamp, MD  Additional Notes: Functioning IV was confirmed and monitors were applied.  A 50mm 22ga Arrow echogenic stimulator needle was used. Sterile prep and drape,hand hygiene and sterile gloves were used. Ultrasound guidance: relevant anatomy identified, needle position confirmed, local anesthetic spread visualized around nerve(s)., vascular puncture avoided.  Image printed for medical record. Negative aspiration and negative test dose prior to incremental administration of local anesthetic. The patient tolerated the procedure well.    Femoral nerve block

## 2011-12-14 NOTE — Anesthesia Postprocedure Evaluation (Signed)
  Anesthesia Post-op Note  Patient: Tiffany Yu  Procedure(s) Performed: Procedure(s) (LRB) with comments: COMPUTER ASSISTED TOTAL KNEE ARTHROPLASTY (Right)  Patient Location: PACU  Anesthesia Type:GA combined with regional for post-op pain  Level of Consciousness: awake, alert , oriented and patient cooperative  Airway and Oxygen Therapy: Patient Spontanous Breathing and Patient connected to nasal cannula oxygen  Post-op Pain: none  Post-op Assessment: Post-op Vital signs reviewed, Patient's Cardiovascular Status Stable, Respiratory Function Stable, Patent Airway, No signs of Nausea or vomiting and Pain level controlled  Post-op Vital Signs: Reviewed and stable  Complications: No apparent anesthesia complications

## 2011-12-14 NOTE — Progress Notes (Signed)
ANTICOAGULATION CONSULT NOTE - Initial Consult  Pharmacy Consult for coumadin Indication: VTE prophylaxis  No Known Allergies  Patient Measurements:    Vital Signs: Temp: 98 F (36.7 C) (12/13 1445) Temp src: Oral (12/13 0618) BP: 115/71 mmHg (12/13 1445) Pulse Rate: 81  (12/13 1445)  Labs: No results found for this basename: HGB:2,HCT:3,PLT:3,APTT:3,LABPROT:3,INR:3,HEPARINUNFRC:3,CREATININE:3,CKTOTAL:3,CKMB:3,TROPONINI:3 in the last 72 hours  CrCl is unknown because there is no height on file for the current visit.   Medical History: Past Medical History  Diagnosis Date  . Hypertension   . Hypothyroidism   . Anxiety   . Depression   . Arthritis     Medications:  Scheduled:    . [COMPLETED] acetaminophen  1,000 mg Intravenous Once  . acetaminophen  1,000 mg Intravenous Q6H  . [COMPLETED]  ceFAZolin (ANCEF) IV  2 g Intravenous 60 min Pre-Op  .  ceFAZolin (ANCEF) IV  2 g Intravenous Q6H  . docusate sodium  100 mg Oral BID  . DULoxetine  60 mg Oral Daily  . escitalopram  10 mg Oral Daily  . estradiol  1 mg Oral Daily  . ferrous sulfate  325 mg Oral BID WC  . HYDROmorphone      . HYDROmorphone      . HYDROmorphone      . levothyroxine  50 mcg Oral QAC breakfast  . [COMPLETED] midazolam      . nebivolol  5 mg Oral Daily  . oxyCODONE      . [DISCONTINUED] povidone-iodine   Topical Once    Assessment: 56 yo who is s/p TKA. Coumadin ordered for DVT px. MD wants to shoot for INR around 2. No baseline INR so will get one.   Goal of Therapy:  INR ~2 Monitor platelets by anticoagulation protocol: Yes   Plan:  Coumadin 7.5mg  PO x1 Baseline INR then daily Coumadin teaching book/video  Ulyses Southward [place] 12/14/2011,3:07 PM

## 2011-12-14 NOTE — Transfer of Care (Signed)
Immediate Anesthesia Transfer of Care Note  Patient: Tiffany Yu  Procedure(s) Performed: Procedure(s) (LRB) with comments: COMPUTER ASSISTED TOTAL KNEE ARTHROPLASTY (Right)  Patient Location: PACU  Anesthesia Type:GA combined with regional for post-op pain  Level of Consciousness: awake, alert , oriented and patient cooperative  Airway & Oxygen Therapy: Patient Spontanous Breathing and Patient connected to nasal cannula oxygen  Post-op Assessment: Report given to PACU RN, Post -op Vital signs reviewed and stable and Patient moving all extremities X 4  Post vital signs: Reviewed and stable  Complications: No apparent anesthesia complications

## 2011-12-14 NOTE — Op Note (Signed)
Tiffany Yu, Tiffany Yu                ACCOUNT NO.:  [id number]  MEDICAL RECORD NO.:  [id number]  LOCATION:  5N28C                        FACILITY:  [facility]  PHYSICIAN:  Harvie Junior, M.D.   DATE OF BIRTH:  04-May-1955  DATE OF PROCEDURE:  12/14/2011 DATE OF DISCHARGE:                              OPERATIVE REPORT   PREOPERATIVE DIAGNOSIS:  End-stage degenerative joint disease, right knee.  POSTOPERATIVE DIAGNOSIS:  End-stage degenerative joint disease, right knee.  PRINCIPAL PROCEDURE: 1. Total knee replacement with a Sigma system, size 3 femur, size 3     tibia, 10-mm bridging bearing and a 38-mm all polyethylene patella. 2. Computer-assisted right total knee replacement.  SURGEON:  Harvie Junior, M.D.  ASSISTANT:  Marshia Ly, PA  ANESTHESIA:  General.  BRIEF HISTORY:  Ms. Lasure is a 56 year old female with long history of having significant complaints of right knee pain.  She had been treated conservatively for prolonged period of time.  She has had arthroscopy, activity modification, injection therapy, failed all of these.  Because of complaints of pain, and x-ray showing bone-on-bone changes, the patient was taken to the operating room for right total knee replacement.  Because of her young age and need for perfect neutral long alignment, computer assistance was chose to be using preoperatively, and the patient was taken to the operating room for this procedure.  PROCEDURE:  The patient was taken to the operating room and after adequate level of anesthesia was obtained with general anesthetic, the patient was placed supine on the operating table.  The attention was then turned to the right leg, which was prepped and draped in usual sterile fashion.  Following exsanguination of the extremity, the blood pressure tourniquet was inflated to 350 mmHg.  Following this, the leg was prepped and draped in usual sterile fashion and exsanguinated.  A midline incision was made to  subcutaneous tissue down to level of the extensor mechanism and a medial parapatellar arthrotomy was undertaken. Attention was then turned to the legs where the anterior and posterior cruciates were removed, retropatellar fat pad, medial and lateral meniscus and synovium in the anterior aspect of the femur.  Following this, attention was turned to the computer model, two pins were placed in the tibia and two pins in the femur, and the arrays were placed.  The registration process was undertaken, this adds 30 minutes of surgical procedure.  Once this was completed, attention was turned back to the knee where the tibia was cut perpendicular to its long axis.  The femur was cut perpendicular to the anatomic axis and the spacer block put in place.  Once this was done, we achieved perfect neutral and gap balance. At this point, attention was then turned to the femur, was sized to a 3. Anterior and posterior cuts were made, chamfer cuts and box, and the attention was then turned to the tibia, which had been cut perpendicular to the long axis and the tibia was now drilled and keeled.  Once this was done, trials were put in place, size 3 femur, size 3 tibia, 10-mm bridging bearing.  Attention was turned to the patella, cut down to a level of 13  and 38 paddle was chosen and lugs were drilled for the patella and then lugs were drilled on the femur.  Following this, the knee was put through a range of motion, excellent stability, range of motion and the computer assistance was checked at this point, showing perfect neutral, long alignment, and gap balance.  Attention was then turned towards removal of all trial components.  The knee was then copiously and thoroughly lavaged and suctioned dry.  The final components were then cemented into place, size 3 femur, size 3 tibia, a 10-mm bridging bearing, and a 38-mm all poly patella.  The patella was held with a clamp.  All excess bone cement was removed.   Attention was again turned towards computer modules, which showed perfect neutral and long alignment.  At this point, the computer was removed.  The medium Hemovac drain was placed.  The tourniquet was let down.  Once the cement was allowed to harden, all excess bone cement had been removed and the bleeders were controlled with electrocautery at this point.  The knee was copiously and thoroughly lavaged.  The final poly was put in place and knee reduced, put through a range of motion.  Excellent stability was in mid-flexion and also in the deep flexion.  At this point, the arthrotomy was closed with 1 Vicryl running, skin with 0 and 2-0 Vicryl and 3-0 Monocryl subcuticular.  Benzoin and Steri-Strips were applied. Sterile compressive dressing was applied, and the patient was taken to the recovery room and she was noted to be in satisfactory condition. Estimated blood loss for the procedure was less than 50 mL.     Harvie Junior, M.D.     Ranae Plumber  D:  12/14/2011  T:  12/14/2011  Job:  [id number]

## 2011-12-14 NOTE — Plan of Care (Signed)
Problem: Consults Goal: Diagnosis- Total Joint Replacement Primary Total Knee Right     

## 2011-12-14 NOTE — Progress Notes (Signed)
Orthopedic Tech Progress Note Patient Details:  Tiffany Yu [facility] 1955/07/08 [id number] CPM applied to Right knee with appropriate settings. OHF applied to bed. CPM Right Knee CPM Right Knee: On Right Knee Flexion (Degrees): 60  Right Knee Extension (Degrees): 0    Asia R Thompson 12/14/2011, 11:34 AM

## 2011-12-15 LAB — CBC
Hemoglobin: 9.9 g/dL — ABNORMAL LOW (ref 12.0–15.0)
MCH: 28.9 pg (ref 26.0–34.0)
MCHC: 35.7 g/dL (ref 30.0–36.0)
MCV: 81 fL (ref 78.0–100.0)

## 2011-12-15 LAB — BASIC METABOLIC PANEL
Calcium: 8.4 mg/dL (ref 8.4–10.5)
Creatinine, Ser: 1.32 mg/dL — ABNORMAL HIGH (ref 0.50–1.10)
GFR calc non Af Amer: 44 mL/min — ABNORMAL LOW (ref >90)
Glucose, Bld: 129 mg/dL — ABNORMAL HIGH (ref 70–99)
Sodium: 132 mEq/L — ABNORMAL LOW (ref 135–145)

## 2011-12-15 LAB — PROTIME-INR: Prothrombin Time: 15.2 seconds (ref 11.6–15.2)

## 2011-12-15 MED ORDER — WARFARIN SODIUM 7.5 MG PO TABS
7.5000 mg | ORAL_TABLET | Freq: Once | ORAL | Status: AC
  Administered 2011-12-15: 7.5 mg via ORAL
  Filled 2011-12-15: qty 1

## 2011-12-15 NOTE — Progress Notes (Signed)
Physical Therapy Treatment Patient Details Name: CERRIA RANDHAWA MRN: [id number] DOB: [birth year] Today's Date: 12/15/2011 Time: 9147-8295 PT Time Calculation (min): 27 min  PT Assessment / Plan / Recommendation Comments on Treatment Session  Pt able to significantly increase ambulation distance today.      Follow Up Recommendations  Home health PT;Supervision/Assistance - 24 hour     Does the patient have the potential to tolerate intense rehabilitation     Barriers to Discharge        Equipment Recommendations  None recommended by PT    Recommendations for Other Services    Frequency 7X/week   Plan Discharge plan remains appropriate;Frequency remains appropriate    Precautions / Restrictions Precautions Precautions: Knee Required Braces or Orthoses: Knee Immobilizer - Right Knee Immobilizer - Right: On when out of bed or walking Restrictions Weight Bearing Restrictions: Yes RLE Weight Bearing: Weight bearing as tolerated   Pertinent Vitals/Pain Pt premedicated prior to treatment.  Only c/o soreness.  Ice provided after therapy.     Mobility  Bed Mobility Bed Mobility: Not assessed Transfers Transfers: Sit to Stand;Stand to Sit Sit to Stand: 4: Min guard;With upper extremity assist;From bed Stand to Sit: 4: Min guard;With upper extremity assist;With armrests;To chair/3-in-1 Details for Transfer Assistance: Cues for techniqe Ambulation/Gait Ambulation/Gait Assistance: 5: Supervision Ambulation Distance (Feet): 175 Feet Assistive device: Rolling walker Ambulation/Gait Assistance Details: Cues initially to sequence.   Gait Pattern: Step-to pattern Stairs: No Wheelchair Mobility Wheelchair Mobility: No    Exercises Total Joint Exercises Ankle Circles/Pumps: AROM;Strengthening;Both;10 reps;Supine Quad Sets: AROM;Strengthening;Right;10 reps;Supine Hip ABduction/ADduction: AAROM;Right;10 reps;Supine Long Arc Quad: AAROM;Right;10 reps;Seated Knee Flexion:  AAROM;Right;10 reps;Seated      PT Goals Acute Rehab PT Goals PT Goal: Sit to Stand - Progress: Progressing toward goal PT Goal: Stand to Sit - Progress: Progressing toward goal PT Goal: Ambulate - Progress: Progressing toward goal PT Goal: Perform Home Exercise Program - Progress: Progressing toward goal  Visit Information  Last PT Received On: 12/15/11 Assistance Needed: +1          Cognition  Overall Cognitive Status: Appears within functional limits for tasks assessed/performed Arousal/Alertness: Awake/alert Orientation Level: Appears intact for tasks assessed Behavior During Session: [facility] for tasks performed         End of Session PT - End of Session Equipment Utilized During Treatment: Gait belt;Right knee immobilizer Activity Tolerance: Patient tolerated treatment well Patient left: in chair;with call bell/phone within reach Nurse Communication: Mobility status    Newell Coral 12/15/2011, 11:27 AM  Newell Coral, PTA Acute Rehab [phone] (office)

## 2011-12-15 NOTE — Progress Notes (Signed)
ANTICOAGULATION CONSULT NOTE - Initial Consult  Pharmacy Consult for coumadin Indication: VTE prophylaxis  No Known Allergies  Patient Measurements: Height: 5\' 8"  (172.7 cm) Weight: 225 lb (102.059 kg) IBW/kg (Calculated) : 63.9   Vital Signs: Temp: 98.1 F (36.7 C) (12/14 0600) Temp src: Oral (12/14 0600) BP: 116/69 mmHg (12/14 0600) Pulse Rate: 77  (12/14 0600)  Labs:  Basename 12/15/11 0605 12/14/11 1636  HGB 9.9* --  HCT 27.7* --  PLT 211 --  APTT -- --  LABPROT 15.2 14.5  INR 1.22 1.15  HEPARINUNFRC -- --  CREATININE 1.32* --  CKTOTAL -- --  CKMB -- --  TROPONINI -- --    Estimated Creatinine Clearance: 58.1 ml/min (by C-G formula based on Cr of 1.32).   Medical History: Past Medical History  Diagnosis Date  . Hypertension   . Hypothyroidism   . Anxiety   . Depression   . Arthritis   . Refusal of blood transfusions as patient is Jehovah's Witness     Medications:  Scheduled:     . [COMPLETED] acetaminophen  1,000 mg Intravenous Q6H  . [COMPLETED]  ceFAZolin (ANCEF) IV  2 g Intravenous Q6H  . docusate sodium  100 mg Oral BID  . DULoxetine  60 mg Oral Daily  . escitalopram  10 mg Oral Daily  . estradiol  1 mg Oral Daily  . ferrous sulfate  325 mg Oral BID WC  . [EXPIRED] HYDROmorphone      . [EXPIRED] HYDROmorphone      . [EXPIRED] HYDROmorphone      . levothyroxine  50 mcg Oral QAC breakfast  . nebivolol  5 mg Oral Daily  . [EXPIRED] oxyCODONE      . [COMPLETED] patient's guide to using coumadin book   Does not apply Once  . [COMPLETED] warfarin  7.5 mg Oral ONCE-1800  . [COMPLETED] warfarin   Does not apply Once  . Warfarin - Pharmacist Dosing Inpatient   Does not apply q1800  . [DISCONTINUED] povidone-iodine   Topical Once    Assessment: 56 yo who is s/p TKA. Coumadin ordered for DVT px. MD wants to shoot for INR around 2. Baseline was 1.15 now at 1.22 after one dose of 7.5mg .  Goal of Therapy:  INR ~2 Monitor platelets by  anticoagulation protocol: Yes   Plan:  Coumadin 7.5mg  PO x1 Daily INR  Vania Rea. Darin Engels.D. Clinical Pharmacist Pager [phone] Phone [phone] 12/15/2011 1:07 PM

## 2011-12-15 NOTE — Progress Notes (Signed)
Subjective: 1 Day Post-Op Procedure(s) (LRB): COMPUTER ASSISTED TOTAL KNEE ARTHROPLASTY (Right) Patient reports pain as mild. Tolerating PO.  Had a good day overall, up working with PT, ambulating outside room, considering fitness for d/c tomorrow vs. Monday  Objective: Vital signs in last 24 hours: Temp:  [98.1 F (36.7 C)-98.9 F (37.2 C)] 98.9 F (37.2 C) (12/14 1430) Pulse Rate:  [76-77] 76  (12/14 1430) Resp:  [18] 18  (12/14 1430) BP: (108-116)/(54-69) 108/54 mmHg (12/14 1430) SpO2:  [97 %-100 %] 97 % (12/14 1430)  Intake/Output from previous day: 12/13 0701 - 12/14 0700 In: 2315 [P.O.:240; I.V.:1400] Out: 2950 [Urine:2700; Drains:150; Blood:100] Intake/Output this shift: Total I/O In: 1040 [P.O.:1040] Out: 960 [Urine:950; Drains:10]   Basename 12/15/11 0605  HGB 9.9*    Basename 12/15/11 0605  WBC 11.4*  RBC 3.42*  HCT 27.7*  PLT 211    Basename 12/15/11 0605  NA 132*  K 3.8  CL 99  CO2 28  BUN 13  CREATININE 1.32*  GLUCOSE 129*  CALCIUM 8.4    Basename 12/15/11 0605 12/14/11 1636  LABPT -- --  INR 1.22 1.15    Neurovascular intact dressing clean/dry on surface, drain intact  Assessment/Plan: 1 Day Post-Op Procedure(s) (LRB): COMPUTER ASSISTED TOTAL KNEE ARTHROPLASTY (Right) Up with therapy; heplock IVFs Continue PT VTE-coumadin  Joniya Boberg A. 12/15/2011, 6:38 PM

## 2011-12-16 LAB — CBC
HCT: 25.8 % — ABNORMAL LOW (ref 36.0–46.0)
Hemoglobin: 9.3 g/dL — ABNORMAL LOW (ref 12.0–15.0)
MCV: 81.4 fL (ref 78.0–100.0)
Platelets: 201 10*3/uL (ref 150–400)
RBC: 3.17 MIL/uL — ABNORMAL LOW (ref 3.87–5.11)
WBC: 10.8 10*3/uL — ABNORMAL HIGH (ref 4.0–10.5)

## 2011-12-16 LAB — PROTIME-INR: INR: 1.77 — ABNORMAL HIGH (ref 0.00–1.49)

## 2011-12-16 MED ORDER — DSS 100 MG PO CAPS: 100.0000 mg | ORAL_CAPSULE | Freq: Every day | ORAL | Status: DC

## 2011-12-16 MED ORDER — OXYCODONE HCL 5 MG PO TABS: 5.0000 mg | ORAL_TABLET | ORAL | Status: DC | PRN

## 2011-12-16 MED ORDER — WARFARIN - PHARMACIST DOSING INPATIENT: Status: DC

## 2011-12-16 NOTE — Discharge Summary (Signed)
Physician Discharge Summary  Patient ID: Tiffany Yu MRN: [id number] DOB/AGE: 1955-12-11 56 y.o.  Admit date: 12/14/2011 Discharge date: 12/16/2011  Admission Diagnoses:  Osteoarthritis of right knee  Discharge Diagnoses:  Principal Problem:  *Osteoarthritis of right knee   Past Medical History  Diagnosis Date  . Hypertension   . Hypothyroidism   . Anxiety   . Depression   . Arthritis   . Refusal of blood transfusions as patient is Jehovah's Witness     Surgeries: Procedure(s): COMPUTER ASSISTED TOTAL KNEE ARTHROPLASTY on 12/14/2011   Consultants (if any):    Discharged Condition: Improved  Hospital Course: Tiffany Yu is an 56 y.o. female who was admitted 12/14/2011 with a diagnosis of Osteoarthritis of right knee and went to the operating room on 12/14/2011 and underwent the above named procedures.    She was given perioperative antibiotics:  Anti-infectives     Start     Dose/Rate Route Frequency Ordered Stop   12/14/11 1600   ceFAZolin (ANCEF) IVPB 2 g/50 mL premix        2 g 100 mL/hr over 30 Minutes Intravenous Every 6 hours 12/14/11 1456 12/14/11 2249   12/14/11 0830   cefUROXime (ZINACEF) injection  Status:  Discontinued          As needed 12/14/11 0832 12/14/11 1002   12/13/11 1501   ceFAZolin (ANCEF) IVPB 2 g/50 mL premix        2 g 100 mL/hr over 30 Minutes Intravenous 60 min pre-op 12/13/11 1501 12/14/11 0755        .  She was given sequential compression devices, early ambulation, and Lovenox/Coumadin for DVT prophylaxis.  She benefited maximally from the hospital stay and there were no complications.    Recent vital signs:  Filed Vitals:   12/16/11 0634  BP: 129/64  Pulse: 86  Temp: 98.1 F (36.7 C)  Resp: 16    Recent laboratory studies:  Lab Results  Component Value Date   HGB 9.3* 12/16/2011   HGB 9.9* 12/15/2011   HGB 12.8 08/16/2009   Lab Results  Component Value Date   WBC 10.8* 12/16/2011   PLT 201 12/16/2011    Lab Results  Component Value Date   INR 1.77* 12/16/2011   Lab Results  Component Value Date   NA 132* 12/15/2011   K 3.8 12/15/2011   CL 99 12/15/2011   CO2 28 12/15/2011   BUN 13 12/15/2011   CREATININE 1.32* 12/15/2011   GLUCOSE 129* 12/15/2011    Discharge Medications:     Medication List     As of 12/16/2011 11:08 AM    TAKE these medications         DSS 100 MG Caps   Take 100 mg by mouth daily.      DULoxetine 30 MG capsule   Commonly known as: CYMBALTA   Take 60 mg by mouth daily.      escitalopram 10 MG tablet   Commonly known as: LEXAPRO   Take 10 mg by mouth daily.      estradiol 1 MG tablet   Commonly known as: ESTRACE   Take 1 mg by mouth daily.      levothyroxine 50 MCG tablet   Commonly known as: SYNTHROID, LEVOTHROID   Take 50 mcg by mouth daily.      nebivolol 5 MG tablet   Commonly known as: BYSTOLIC   Take 5 mg by mouth daily.      oxyCODONE 5 MG immediate  release tablet   Commonly known as: Oxy IR/ROXICODONE   Take 1-2 tablets (5-10 mg total) by mouth every 4 (four) hours as needed for pain.      Warfarin - Pharmacist Dosing Inpatient Misc   Take as directed, Goal INR 2.0      zolpidem 10 MG tablet   Commonly known as: AMBIEN   Take 10 mg by mouth at bedtime as needed. For sleep.        Diagnostic Studies: Dg Chest 2 View  12/13/2011  *RADIOLOGY REPORT*  Clinical Data: Patient for knee replacement.  CHEST - 2 VIEW  Comparison: Plain film and CT chest 01/12/2008.  Findings: Lungs are clear.  Heart size is upper normal.  No pneumothorax or pleural fluid.  IMPRESSION: No acute disease.   Original Report Authenticated By: Holley Dexter, M.D.     Disposition:         Follow-up Information    Follow up with GRAVES,JOHN L, MD. In 2 weeks.   Contact information:   [address] [place] [address] [phone]           Signed: Janee Morn, Ruven Corradi A. 12/16/2011, 11:08 AM

## 2011-12-16 NOTE — Progress Notes (Signed)
Subjective: Did well overnight, eager to be D/C today.   Objective: Vital signs in last 24 hours: Temp:  [98.1 F (36.7 C)-98.9 F (37.2 C)] 98.1 F (36.7 C) (12/15 0634) Pulse Rate:  [76-86] 86  (12/15 0634) Resp:  [16-18] 16  (12/15 0634) BP: (108-130)/(54-76) 129/64 mmHg (12/15 0634) SpO2:  [96 %-97 %] 96 % (12/15 0634)  Intake/Output from previous day: 12/14 0701 - 12/15 0700 In: 1835 [P.O.:1760] Out: 960 [Urine:950; Drains:10] Intake/Output this shift:     Basename 12/16/11 0452 12/15/11 0605  HGB 9.3* 9.9*    Basename 12/16/11 0452 12/15/11 0605  WBC 10.8* 11.4*  RBC 3.17* 3.42*  HCT 25.8* 27.7*  PLT 201 211    Basename 12/15/11 0605  NA 132*  K 3.8  CL 99  CO2 28  BUN 13  CREATININE 1.32*  GLUCOSE 129*  CALCIUM 8.4    Basename 12/16/11 0452 12/15/11 0605  LABPT -- --  INR 1.77* 1.22    Sensation intact distally Intact pulses distally Dorsiflexion/Plantar flexion intact Incision: benigh  Assessment/Plan: Postop, doing well D/C today, f/u Dr. Luiz Blare 2 weeks VTE-Coumadin thru [facility]   Segundo Makela A. 12/16/2011, 10:29 AM

## 2011-12-16 NOTE — Progress Notes (Signed)
   CARE MANAGEMENT NOTE 12/16/2011  Patient:  CALDONIA, LEAP   Account Number:  [id number]  Date Initiated:  12/16/2011  Documentation initiated by:  [facility]  Subjective/Objective Assessment:     Action/Plan:   Anticipated DC Date:  12/16/2011   Anticipated DC Plan:  HOME W HOME HEALTH SERVICES      DC Planning Services  CM consult      [facility] Choice  HOME HEALTH   Choice offered to / List presented to:  C-1 Patient        HH arranged  HH-1 RN  HH-2 PT      [facility] agency  [facility]   Status of service:  Completed, signed off Medicare Important Message given?   (If response is "NO", the following Medicare IM given date fields will be blank) Date Medicare IM given:   Date Additional Medicare IM given:    Discharge Disposition:  HOME W HOME HEALTH SERVICES  Per UR Regulation:    If discussed at Long Length of Stay Meetings, dates discussed:    Comments:  12/16/2011 1230 NCM spoke to pt and she is requesting [facility] for [facility]. Added [facility] to d/c instructions. Contacted [facility] to make aware of lab draw for INR and HH for scheduled d/c home today. States she has DME at home. Isidoro Donning RN CCM Case Mgmt phone [phone]

## 2011-12-17 ENCOUNTER — Encounter (HOSPITAL_COMMUNITY): Payer: Self-pay | Admitting: Orthopedic Surgery

## 2012-01-08 ENCOUNTER — Ambulatory Visit: Payer: Self-pay | Admitting: Licensed Clinical Social Worker

## 2012-02-02 ENCOUNTER — Emergency Department (HOSPITAL_COMMUNITY)
Admission: EM | Admit: 2012-02-02 | Discharge: 2012-02-03 | Disposition: A | Discharge status: Home / Self Care | Payer: 59 | Attending: Emergency Medicine | Admitting: Emergency Medicine

## 2012-02-02 ENCOUNTER — Other Ambulatory Visit: Payer: Self-pay

## 2012-02-02 DIAGNOSIS — R071 Chest pain on breathing: Secondary | ICD-10-CM | POA: Insufficient documentation

## 2012-02-02 DIAGNOSIS — M79661 Pain in right lower leg: Secondary | ICD-10-CM

## 2012-02-02 DIAGNOSIS — F411 Generalized anxiety disorder: Secondary | ICD-10-CM | POA: Insufficient documentation

## 2012-02-02 DIAGNOSIS — E039 Hypothyroidism, unspecified: Secondary | ICD-10-CM | POA: Insufficient documentation

## 2012-02-02 DIAGNOSIS — M549 Dorsalgia, unspecified: Secondary | ICD-10-CM | POA: Insufficient documentation

## 2012-02-02 DIAGNOSIS — G8918 Other acute postprocedural pain: Secondary | ICD-10-CM | POA: Insufficient documentation

## 2012-02-02 DIAGNOSIS — M25579 Pain in unspecified ankle and joints of unspecified foot: Secondary | ICD-10-CM | POA: Insufficient documentation

## 2012-02-02 DIAGNOSIS — Z96659 Presence of unspecified artificial knee joint: Secondary | ICD-10-CM | POA: Insufficient documentation

## 2012-02-02 DIAGNOSIS — Z8739 Personal history of other diseases of the musculoskeletal system and connective tissue: Secondary | ICD-10-CM | POA: Insufficient documentation

## 2012-02-02 DIAGNOSIS — F3289 Other specified depressive episodes: Secondary | ICD-10-CM | POA: Insufficient documentation

## 2012-02-02 DIAGNOSIS — Z79899 Other long term (current) drug therapy: Secondary | ICD-10-CM | POA: Insufficient documentation

## 2012-02-02 DIAGNOSIS — F329 Major depressive disorder, single episode, unspecified: Secondary | ICD-10-CM | POA: Insufficient documentation

## 2012-02-02 DIAGNOSIS — M25569 Pain in unspecified knee: Secondary | ICD-10-CM | POA: Insufficient documentation

## 2012-02-02 DIAGNOSIS — R0789 Other chest pain: Secondary | ICD-10-CM

## 2012-02-02 DIAGNOSIS — I10 Essential (primary) hypertension: Secondary | ICD-10-CM | POA: Insufficient documentation

## 2012-02-02 NOTE — ED Notes (Signed)
Patient from home, Per EMS, patient complaining of chest pain that radiates to her back and SOB.  Paramedic administered 3 Nitro in route.  Patient reports taking 325 of aspirin and dilaudid prior to EMS getting to her house.

## 2012-02-03 ENCOUNTER — Emergency Department (HOSPITAL_COMMUNITY): Payer: 59

## 2012-02-03 ENCOUNTER — Ambulatory Visit (HOSPITAL_COMMUNITY)
Admission: RE | Admit: 2012-02-03 | Discharge: 2012-02-03 | Disposition: A | Discharge status: Home / Self Care | Payer: 59 | Source: Ambulatory Visit | Attending: Emergency Medicine | Admitting: Emergency Medicine

## 2012-02-03 ENCOUNTER — Encounter (HOSPITAL_COMMUNITY): Payer: Self-pay | Admitting: Emergency Medicine

## 2012-02-03 DIAGNOSIS — M7989 Other specified soft tissue disorders: Secondary | ICD-10-CM

## 2012-02-03 DIAGNOSIS — M79609 Pain in unspecified limb: Secondary | ICD-10-CM

## 2012-02-03 LAB — BASIC METABOLIC PANEL
BUN: 11 mg/dL (ref 6–23)
Chloride: 101 mEq/L (ref 96–112)
Creatinine, Ser: 1.26 mg/dL — ABNORMAL HIGH (ref 0.50–1.10)
GFR calc non Af Amer: 46 mL/min — ABNORMAL LOW (ref >90)
Glucose, Bld: 118 mg/dL — ABNORMAL HIGH (ref 70–99)
Potassium: 3.4 mEq/L — ABNORMAL LOW (ref 3.5–5.1)

## 2012-02-03 LAB — CBC
HCT: 29.9 % — ABNORMAL LOW (ref 36.0–46.0)
Hemoglobin: 10.6 g/dL — ABNORMAL LOW (ref 12.0–15.0)
MCHC: 35.5 g/dL (ref 30.0–36.0)
MCV: 79.9 fL (ref 78.0–100.0)

## 2012-02-03 LAB — POCT I-STAT TROPONIN I: Troponin i, poc: 0 ng/mL (ref 0.00–0.08)

## 2012-02-03 MED ORDER — ENOXAPARIN SODIUM 100 MG/ML ~~LOC~~ SOLN
100.0000 mg | Freq: Once | SUBCUTANEOUS | Status: AC
  Administered 2012-02-03: 100 mg via SUBCUTANEOUS
  Filled 2012-02-03: qty 1

## 2012-02-03 MED ORDER — IOHEXOL 350 MG/ML SOLN
100.0000 mL | Freq: Once | INTRAVENOUS | Status: AC | PRN
  Administered 2012-02-03: 100 mL via INTRAVENOUS

## 2012-02-03 MED ORDER — FENTANYL CITRATE 0.05 MG/ML IJ SOLN
100.0000 ug | Freq: Once | INTRAMUSCULAR | Status: AC
  Administered 2012-02-03: 100 ug via INTRAVENOUS
  Filled 2012-02-03: qty 2

## 2012-02-03 NOTE — Progress Notes (Signed)
VASCULAR LAB PRELIMINARY  PRELIMINARY  PRELIMINARY  PRELIMINARY  Carotid Dopplers completed.    Preliminary report:  There is no DVT or SVT noted in the bilateral lower extremities.  Kalub Morillo, RVT 02/03/2012, 5:24 PM    -

## 2012-02-03 NOTE — ED Notes (Signed)
Patient back from XRAY

## 2012-02-03 NOTE — ED Provider Notes (Signed)
57yo F, c/o "sharp" upper chest pain that began approx 5 hours PTA.  Pt took her own dilaudid without relief.  Pt also c/o persistent "swelling" and "pain" to her right knee s/p TKR in 12/2011 (2 months ago).  States she was told by her Ortho MD last week that "it was ok."  VSS, resps easy, TTP chest wall, right knee edema with TTP right popliteal fossa and proximal calf, no erythema/ecchymosis, NMS intact right foot. Troponin x2 negative, EKG without acute STTW changes; doubt ACS as cause for symptoms given approx 9-10 hours of constant pain.  CT-A chest negative for PE, pneumonia, dissection.  Likely msk pain. Concern re: right knee pain with elevated d-dimer, will dose SQ lovenox and check Vasc US r/o DVT.  Pt does not want to stay in the ED and wait for Tech to come in this morning, would rather go home and come back.  Will d/c home with f/u outpt Vasc US today.   Results for orders placed during the hospital encounter of 02/02/12  CBC      Component Value Range   WBC 7.4  4.0 - 10.5 K/uL   RBC 3.74 (*) 3.87 - 5.11 MIL/uL   Hemoglobin 10.6 (*) 12.0 - 15.0 g/dL   HCT 40.9 (*) 81.1 - 91.4 %   MCV 79.9  78.0 - 100.0 fL   MCH 28.3  26.0 - 34.0 pg   MCHC 35.5  30.0 - 36.0 g/dL   RDW 78.2  95.6 - 21.3 %   Platelets 279  150 - 400 K/uL  BASIC METABOLIC PANEL      Component Value Range   Sodium 137  135 - 145 mEq/L   Potassium 3.4 (*) 3.5 - 5.1 mEq/L   Chloride 101  96 - 112 mEq/L   CO2 29  19 - 32 mEq/L   Glucose, Bld 118 (*) 70 - 99 mg/dL   BUN 11  6 - 23 mg/dL   Creatinine, Ser 0.86 (*) 0.50 - 1.10 mg/dL   Calcium 8.8  8.4 - 57.8 mg/dL   GFR calc non Af Amer 46 (*) >90 mL/min   GFR calc Af Amer 53 (*) >90 mL/min  PROTIME-INR      Component Value Range   Prothrombin Time 14.2  11.6 - 15.2 seconds   INR 1.11  0.00 - 1.49  POCT I-STAT TROPONIN I      Component Value Range   Troponin i, poc 0.00  0.00 - 0.08 ng/mL   Comment 3           D-DIMER, QUANTITATIVE      Component Value Range   D-Dimer, Quant 2.68 (*) 0.00 - 0.48 ug/mL-FEU  TROPONIN I      Component Value Range   Troponin I <0.30  <0.30 ng/mL   Dg Chest 2 View 02/03/2012  *RADIOLOGY REPORT*  Clinical Data: Chest pain.  Near syncope.  Shortness of breath.  CHEST - 2 VIEW  Comparison: 12/13/2011  Findings: The heart size and pulmonary vascularity are normal. The lungs appear clear and expanded without focal air space disease or consolidation. No blunting of the costophrenic angles.  Scattered calcified granulomas in the lungs.  No pneumothorax.  Mediastinal contours appear intact.  Mild degenerative changes in the spine. No significant change since previous study.  IMPRESSION: No evidence of active pulmonary disease.   Original Report Authenticated By: Burman Nieves, M.D.    Ct Angio Chest W/cm &/or Wo Cm 02/03/2012  *RADIOLOGY REPORT*  Clinical Data: Central chest pain radiating to the back.  Knee replacement on 12/16/2011.  Swelling of the left knee.  CT ANGIOGRAPHY CHEST  Technique:  Multidetector CT imaging of the chest using the standard protocol during bolus administration of intravenous contrast. Multiplanar reconstructed images including MIPs were obtained and reviewed to evaluate the vascular anatomy.  Contrast: OMNIPAQUE IOHEXOL 350 MG/ML SOLN  Comparison: 01/12/2008  Findings: Technically adequate study with moderately good opacification of the central and proximal segmental pulmonary arteries.  No central filling defects are demonstrated, suggesting no significant central pulmonary emboli.  The more peripheral pulmonary arteries are not well opacified and smaller peripheral emboli could be obscured.  Mild cardiac enlargement.  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  No evidence of dissection.  No significant lymphadenopathy in the chest.  The esophagus is decompressed.  Surgical absence of the gallbladder.  Visualized portions of the upper abdominal organs are otherwise unremarkable. No  pleural effusions.  Patchy opacities in the lungs are likely due to respiratory motion artifact.  No definite evidence of focal consolidation, edema, or interstitial disease.  Airways appear patent.  No pneumothorax.  Mild degenerative changes in the spine.  IMPRESSION: No evidence of significant pulmonary embolus in the visualized central pulmonary arteries.  Peripheral vessels are not well opacified for evaluation.   Original Report Authenticated By: Burman Nieves, M.D.       Laray Anger, DO 02/03/12 [phone]

## 2012-02-03 NOTE — ED Notes (Signed)
Patient currently sitting up in bed; no respiratory or acute distress noted.  Patient updated on plan of care; informed patient that we are currently waiting on Lovenox to be delivered from pharmacy; denies any needs at this time.  Will continue to monitor.

## 2012-02-03 NOTE — ED Notes (Signed)
Received bedside report from [place], [place].  Patient currently in CT Angio scan; will continue to monitor.

## 2012-02-03 NOTE — ED Provider Notes (Signed)
History     CSN: [id number]  Arrival date & time 02/02/12  2329   First MD Initiated Contact with Patient 02/02/12 2357      Chief Complaint  Patient presents with  . Chest Pain    (Consider location/radiation/quality/duration/timing/severity/associated sxs/prior treatment) Patient is a 57 y.o. female presenting with chest pain. The history is provided by the patient.  Chest Pain The chest pain began 3 - 5 hours ago. Chest pain occurs constantly. The quality of the pain is described as sharp. Pertinent negatives for primary symptoms include no fever, no shortness of breath, no cough, no abdominal pain and no nausea.  Pertinent negatives for associated symptoms include no numbness. Associated symptoms comments: Onset of chest pain while at rest earlier tonight that affects bilateral upper chest, radiating to bilateral upper back. Worse with movement. No dyspnea or painful respirations. NO N, V, fever or cough. She denies any known injury. No history of heart disease. She recently had knee replacement surgery (12/16/11) and complains of persistent pain in the right knee since that time. No calf pain. She does, however, complain of pain in the right ankle with "veins that are swelling" and tender..     Past Medical History  Diagnosis Date  . Hypertension   . Hypothyroidism   . Anxiety   . Depression   . Arthritis   . Refusal of blood transfusions as patient is Jehovah's Witness     Past Surgical History  Procedure Date  . Cholecystectomy   . Tendon release     right wrist  . Knee arthroscopy     Right  . Plantar fascia surgery     right foot  . Dilation and curettage of uterus   . Total knee arthroplasty 12/14/2011    right knee  . Knee arthroplasty 12/14/2011    Procedure: COMPUTER ASSISTED TOTAL KNEE ARTHROPLASTY;  Surgeon: Harvie Junior, MD;  Location: [facility] OR;  Service: Orthopedics;  Laterality: Right;    No family history on file.  History  Substance Use Topics  .  Smoking status: Never Smoker   . Smokeless tobacco: Never Used  . Alcohol Use: Yes     Comment: very rarely    OB History    Grav Para Term Preterm Abortions TAB SAB Ect Mult Living                  Review of Systems  Constitutional: Negative for fever and chills.  Respiratory: Negative.  Negative for cough and shortness of breath.   Cardiovascular: Positive for chest pain and leg swelling.  Gastrointestinal: Negative.  Negative for nausea and abdominal pain.  Musculoskeletal: Positive for back pain.       See HPI.  Skin: Negative.  Negative for color change.  Neurological: Negative.  Negative for numbness.  Hematological: Does not bruise/bleed easily.  Psychiatric/Behavioral: Negative for confusion.    Allergies  Review of patient's allergies indicates no known allergies.  Home Medications   Current Outpatient Rx  Name  Route  Sig  Dispense  Refill  . ESCITALOPRAM OXALATE 10 MG PO TABS   Oral   Take 10 mg by mouth daily.         Marland Kitchen ESTRADIOL 1 MG PO TABS   Oral   Take 1 mg by mouth daily.         Marland Kitchen HYDROMORPHONE HCL 2 MG PO TABS   Oral   Take 2 mg by mouth every 8 (eight) hours as needed. For  pain         . LEVOTHYROXINE SODIUM 50 MCG PO TABS   Oral   Take 50 mcg by mouth daily.         Marland Kitchen METHOCARBAMOL 500 MG PO TABS   Oral   Take 500 mg by mouth 2 (two) times daily as needed. For knee pain         . NEBIVOLOL HCL 5 MG PO TABS   Oral   Take 5 mg by mouth daily.         Marland Kitchen ZOLPIDEM TARTRATE 10 MG PO TABS   Oral   Take 10 mg by mouth at bedtime as needed. For sleep.           BP 117/70  Pulse 71  Temp 98.7 F (37.1 C) (Oral)  Resp 18  SpO2 98%  Physical Exam  Constitutional: She is oriented to person, place, and time. She appears well-developed and well-nourished.  HENT:  Head: Normocephalic.  Neck: Normal range of motion. Neck supple.  Cardiovascular: Normal rate, regular rhythm and intact distal pulses.   No murmur  heard. Pulmonary/Chest: Effort normal and breath sounds normal. She has no wheezes. She has no rales.       Chest wall tender to palpation, right greater than left. No swelling or discoloration.   Abdominal: Soft. Bowel sounds are normal. There is no tenderness. There is no rebound and no guarding.  Musculoskeletal: Normal range of motion.       Right knee moderately swelling with healing midline surgical scar. No redness or wound complications. There is incisional scar tissue growth c/w keloids. No calf tenderness. Ankle tenderness on the anteromedial surface with varicosities present. No redness.   Neurological: She is alert and oriented to person, place, and time.  Skin: Skin is warm and dry. No rash noted.  Psychiatric: She has a normal mood and affect.    ED Course  Procedures (including critical care time)  Labs Reviewed  CBC - Abnormal; Notable for the following:    RBC 3.74 (*)     Hemoglobin 10.6 (*)     HCT 29.9 (*)     All other components within normal limits  BASIC METABOLIC PANEL - Abnormal; Notable for the following:    Potassium 3.4 (*)     Glucose, Bld 118 (*)     Creatinine, Ser 1.26 (*)     GFR calc non Af Amer 46 (*)     GFR calc Af Amer 53 (*)     All other components within normal limits  PROTIME-INR  POCT I-STAT TROPONIN I   Dg Chest 2 View  02/03/2012  *RADIOLOGY REPORT*  Clinical Data: Chest pain.  Near syncope.  Shortness of breath.  CHEST - 2 VIEW  Comparison: 12/13/2011  Findings: The heart size and pulmonary vascularity are normal. The lungs appear clear and expanded without focal air space disease or consolidation. No blunting of the costophrenic angles.  Scattered calcified granulomas in the lungs.  No pneumothorax.  Mediastinal contours appear intact.  Mild degenerative changes in the spine. No significant change since previous study.  IMPRESSION: No evidence of active pulmonary disease.   Original Report Authenticated By: Burman Nieves, M.D.      Date: 02/03/2012  Rate: 74  Rhythm: normal sinus rhythm  QRS Axis: normal  Intervals: normal  ST/T Wave abnormalities: nonspecific T wave changes  Conduction Disutrbances:none  Narrative Interpretation:   Old EKG Reviewed: unchanged    No diagnosis found.  MDM  Given the patient's complaint of chest pain in the setting of taking estrogen daily, will do d-dimer to rule out DVT/PE. Suspect musculoskeletal chest wall pain. Patient care transferred to Dr. Clarene Duke for review and disposition.        Arnoldo Hooker, PA-C 02/03/12 [phone]

## 2012-02-03 NOTE — ED Notes (Signed)
Patient given copy of discharge paperwork; went over discharge instructions with patient.  Patient instructed to follow up with [facility] ED registration in the morning (0800) for doppler study, to follow up with her PCP within two days, and to return to the ED for new, worsening, or concerning symptoms.

## 2012-02-03 NOTE — ED Notes (Signed)
Patient currently resting quietly in bed; no respiratory or acute distress noted.  Patient updated on plan of care; informed patient that she is currently pending for venous doppler.  Patient denies any needs at this time; will continue to monitor.

## 2012-02-06 NOTE — ED Provider Notes (Signed)
Medical screening examination/treatment/procedure(s) were conducted as a shared visit with non-physician practitioner(s) and myself.  I personally evaluated the patient during the encounter.  Please see my previous note.   Laray Anger, DO 02/06/12 0128

## 2012-02-16 ENCOUNTER — Other Ambulatory Visit: Payer: Self-pay

## 2012-02-28 ENCOUNTER — Encounter (HOSPITAL_COMMUNITY): Admission: EM | Disposition: A | Discharge status: Home Health | Payer: Self-pay | Source: Home / Self Care

## 2012-02-28 ENCOUNTER — Inpatient Hospital Stay (HOSPITAL_COMMUNITY)
Admission: EM | Admit: 2012-02-28 | Discharge: 2012-03-01 | DRG: 482 | Disposition: A | Discharge status: Home Health | Payer: 59 | Attending: Emergency Medicine | Admitting: Emergency Medicine

## 2012-02-28 ENCOUNTER — Inpatient Hospital Stay (HOSPITAL_COMMUNITY): Payer: 59 | Admitting: Anesthesiology

## 2012-02-28 ENCOUNTER — Encounter (HOSPITAL_COMMUNITY): Payer: Self-pay | Admitting: Anesthesiology

## 2012-02-28 ENCOUNTER — Encounter (HOSPITAL_COMMUNITY): Payer: Self-pay | Admitting: Family Medicine

## 2012-02-28 ENCOUNTER — Emergency Department (HOSPITAL_COMMUNITY): Payer: 59

## 2012-02-28 ENCOUNTER — Inpatient Hospital Stay (HOSPITAL_COMMUNITY): Payer: 59

## 2012-02-28 ENCOUNTER — Other Ambulatory Visit: Payer: Self-pay | Admitting: Orthopedic Surgery

## 2012-02-28 DIAGNOSIS — S72401A Unspecified fracture of lower end of right femur, initial encounter for closed fracture: Secondary | ICD-10-CM

## 2012-02-28 DIAGNOSIS — F411 Generalized anxiety disorder: Secondary | ICD-10-CM | POA: Diagnosis present

## 2012-02-28 DIAGNOSIS — Z6833 Body mass index (BMI) 33.0-33.9, adult: Secondary | ICD-10-CM

## 2012-02-28 DIAGNOSIS — E039 Hypothyroidism, unspecified: Secondary | ICD-10-CM | POA: Diagnosis present

## 2012-02-28 DIAGNOSIS — F3289 Other specified depressive episodes: Secondary | ICD-10-CM | POA: Diagnosis present

## 2012-02-28 DIAGNOSIS — Z531 Procedure and treatment not carried out because of patient's decision for reasons of belief and group pressure: Secondary | ICD-10-CM

## 2012-02-28 DIAGNOSIS — S72453A Displaced supracondylar fracture without intracondylar extension of lower end of unspecified femur, initial encounter for closed fracture: Principal | ICD-10-CM | POA: Diagnosis present

## 2012-02-28 DIAGNOSIS — Z9089 Acquired absence of other organs: Secondary | ICD-10-CM

## 2012-02-28 DIAGNOSIS — F329 Major depressive disorder, single episode, unspecified: Secondary | ICD-10-CM | POA: Diagnosis present

## 2012-02-28 DIAGNOSIS — M129 Arthropathy, unspecified: Secondary | ICD-10-CM | POA: Diagnosis present

## 2012-02-28 DIAGNOSIS — M1990 Unspecified osteoarthritis, unspecified site: Secondary | ICD-10-CM | POA: Diagnosis present

## 2012-02-28 DIAGNOSIS — S7291XA Unspecified fracture of right femur, initial encounter for closed fracture: Secondary | ICD-10-CM

## 2012-02-28 DIAGNOSIS — I10 Essential (primary) hypertension: Secondary | ICD-10-CM | POA: Diagnosis present

## 2012-02-28 DIAGNOSIS — Y929 Unspecified place or not applicable: Secondary | ICD-10-CM

## 2012-02-28 DIAGNOSIS — D573 Sickle-cell trait: Secondary | ICD-10-CM | POA: Diagnosis present

## 2012-02-28 DIAGNOSIS — Z96659 Presence of unspecified artificial knee joint: Secondary | ICD-10-CM

## 2012-02-28 DIAGNOSIS — W108XXA Fall (on) (from) other stairs and steps, initial encounter: Secondary | ICD-10-CM | POA: Diagnosis present

## 2012-02-28 HISTORY — PX: FEMUR IM NAIL: SHX1597

## 2012-02-28 LAB — BASIC METABOLIC PANEL
BUN: 14 mg/dL (ref 6–23)
CO2: 28 mEq/L (ref 19–32)
Calcium: 9.2 mg/dL (ref 8.4–10.5)
Glucose, Bld: 98 mg/dL (ref 70–99)
Sodium: 135 mEq/L (ref 135–145)

## 2012-02-28 LAB — CBC
HCT: 32.9 % — ABNORMAL LOW (ref 36.0–46.0)
Hemoglobin: 12 g/dL (ref 12.0–15.0)
MCH: 27.9 pg (ref 26.0–34.0)
MCV: 76.5 fL — ABNORMAL LOW (ref 78.0–100.0)
RBC: 4.3 MIL/uL (ref 3.87–5.11)

## 2012-02-28 SURGERY — INSERTION, INTRAMEDULLARY ROD, FEMUR, RETROGRADE
Anesthesia: General | Site: Knee | Laterality: Right | Wound class: Clean

## 2012-02-28 MED ORDER — MIDAZOLAM HCL 5 MG/5ML IJ SOLN
INTRAMUSCULAR | Status: DC | PRN
  Administered 2012-02-28: 2 mg via INTRAVENOUS

## 2012-02-28 MED ORDER — ROCURONIUM BROMIDE 100 MG/10ML IV SOLN
INTRAVENOUS | Status: DC | PRN
  Administered 2012-02-28: 10 mg via INTRAVENOUS
  Administered 2012-02-28: 50 mg via INTRAVENOUS

## 2012-02-28 MED ORDER — METHOCARBAMOL 500 MG PO TABS
500.0000 mg | ORAL_TABLET | Freq: Two times a day (BID) | ORAL | Status: DC | PRN
  Administered 2012-02-28 (×2): 500 mg via ORAL
  Filled 2012-02-28 (×2): qty 1

## 2012-02-28 MED ORDER — GLYCOPYRROLATE 0.2 MG/ML IJ SOLN
INTRAMUSCULAR | Status: DC | PRN
  Administered 2012-02-28: .7 mg via INTRAVENOUS

## 2012-02-28 MED ORDER — 0.9 % SODIUM CHLORIDE (POUR BTL) OPTIME
TOPICAL | Status: DC | PRN
  Administered 2012-02-28: 1000 mL

## 2012-02-28 MED ORDER — ASPIRIN EC 81 MG PO TBEC: 81.0000 mg | DELAYED_RELEASE_TABLET | Freq: Every day | ORAL | Status: DC

## 2012-02-28 MED ORDER — ESCITALOPRAM OXALATE 10 MG PO TABS
10.0000 mg | ORAL_TABLET | Freq: Every day | ORAL | Status: DC
  Administered 2012-02-28 – 2012-03-01 (×3): 10 mg via ORAL
  Filled 2012-02-28 (×3): qty 1

## 2012-02-28 MED ORDER — ACETAMINOPHEN 10 MG/ML IV SOLN
INTRAVENOUS | Status: DC | PRN
  Administered 2012-02-28: 1000 mg via INTRAVENOUS

## 2012-02-28 MED ORDER — ESTRADIOL 1 MG PO TABS
1.0000 mg | ORAL_TABLET | Freq: Every day | ORAL | Status: DC
  Administered 2012-02-28 – 2012-03-01 (×3): 1 mg via ORAL
  Filled 2012-02-28 (×4): qty 1

## 2012-02-28 MED ORDER — NEOSTIGMINE METHYLSULFATE 1 MG/ML IJ SOLN
INTRAMUSCULAR | Status: DC | PRN
  Administered 2012-02-28: 4 mg via INTRAVENOUS

## 2012-02-28 MED ORDER — ONDANSETRON HCL 4 MG/2ML IJ SOLN
4.0000 mg | Freq: Once | INTRAMUSCULAR | Status: AC
  Administered 2012-02-28: 4 mg via INTRAVENOUS
  Filled 2012-02-28: qty 2

## 2012-02-28 MED ORDER — OXYCODONE HCL 5 MG PO TABS
5.0000 mg | ORAL_TABLET | ORAL | Status: DC | PRN
  Administered 2012-02-28 (×2): 5 mg via ORAL
  Filled 2012-02-28 (×2): qty 1

## 2012-02-28 MED ORDER — PROMETHAZINE HCL 25 MG/ML IJ SOLN: 6.2500 mg | INTRAMUSCULAR | Status: DC | PRN

## 2012-02-28 MED ORDER — ENOXAPARIN SODIUM 30 MG/0.3ML ~~LOC~~ SOLN
30.0000 mg | Freq: Two times a day (BID) | SUBCUTANEOUS | Status: DC
  Filled 2012-02-28 (×2): qty 0.3

## 2012-02-28 MED ORDER — LEVOTHYROXINE SODIUM 50 MCG PO TABS
50.0000 ug | ORAL_TABLET | Freq: Every day | ORAL | Status: DC
  Filled 2012-02-28 (×2): qty 1

## 2012-02-28 MED ORDER — NEBIVOLOL HCL 5 MG PO TABS
5.0000 mg | ORAL_TABLET | Freq: Every day | ORAL | Status: DC
  Administered 2012-02-28 – 2012-03-01 (×3): 5 mg via ORAL
  Filled 2012-02-28 (×4): qty 1

## 2012-02-28 MED ORDER — BUPIVACAINE-EPINEPHRINE 0.5% -1:200000 IJ SOLN
INTRAMUSCULAR | Status: DC | PRN
  Administered 2012-02-28: 20 mL

## 2012-02-28 MED ORDER — FLEET ENEMA 7-19 GM/118ML RE ENEM: 1.0000 | ENEMA | Freq: Once | RECTAL | Status: AC | PRN

## 2012-02-28 MED ORDER — ONDANSETRON HCL 4 MG/2ML IJ SOLN: 4.0000 mg | Freq: Four times a day (QID) | INTRAMUSCULAR | Status: DC | PRN

## 2012-02-28 MED ORDER — LACTATED RINGERS IV SOLN
INTRAVENOUS | Status: DC
  Administered 2012-02-28 (×3): via INTRAVENOUS

## 2012-02-28 MED ORDER — ACETAMINOPHEN 10 MG/ML IV SOLN
1000.0000 mg | Freq: Once | INTRAVENOUS | Status: AC
  Administered 2012-02-29: 1000 mg via INTRAVENOUS
  Filled 2012-02-28: qty 100

## 2012-02-28 MED ORDER — DOCUSATE SODIUM 100 MG PO CAPS
100.0000 mg | ORAL_CAPSULE | Freq: Two times a day (BID) | ORAL | Status: DC
  Administered 2012-02-29 – 2012-03-01 (×4): 100 mg via ORAL
  Filled 2012-02-28 (×6): qty 1

## 2012-02-28 MED ORDER — OXYCODONE HCL 5 MG/5ML PO SOLN: 5.0000 mg | Freq: Once | ORAL | Status: AC | PRN

## 2012-02-28 MED ORDER — ONDANSETRON HCL 4 MG PO TABS: 4.0000 mg | ORAL_TABLET | Freq: Four times a day (QID) | ORAL | Status: DC | PRN

## 2012-02-28 MED ORDER — METHOCARBAMOL 100 MG/ML IJ SOLN: 500.0000 mg | Freq: Four times a day (QID) | INTRAVENOUS | Status: DC | PRN

## 2012-02-28 MED ORDER — LACTATED RINGERS IV SOLN
INTRAVENOUS | Status: DC
  Administered 2012-02-28: 18:00:00 via INTRAVENOUS

## 2012-02-28 MED ORDER — HYDROMORPHONE HCL PF 1 MG/ML IJ SOLN: 1.0000 mg | INTRAMUSCULAR | Status: DC | PRN

## 2012-02-28 MED ORDER — FENTANYL CITRATE 0.05 MG/ML IJ SOLN
100.0000 ug | Freq: Once | INTRAMUSCULAR | Status: AC
  Administered 2012-02-28: 100 ug via INTRAVENOUS

## 2012-02-28 MED ORDER — ZOLPIDEM TARTRATE 5 MG PO TABS: 10.0000 mg | ORAL_TABLET | Freq: Every evening | ORAL | Status: DC | PRN

## 2012-02-28 MED ORDER — SODIUM CHLORIDE 0.9 % IV SOLN
INTRAVENOUS | Status: DC
  Administered 2012-02-28: 06:00:00 via INTRAVENOUS

## 2012-02-28 MED ORDER — HYDROMORPHONE HCL PF 1 MG/ML IJ SOLN: 0.2500 mg | INTRAMUSCULAR | Status: DC | PRN

## 2012-02-28 MED ORDER — LEVOTHYROXINE SODIUM 50 MCG PO TABS
50.0000 ug | ORAL_TABLET | Freq: Every day | ORAL | Status: DC
Start: 2012-02-28 — End: 2012-02-28
  Administered 2012-02-28: 50 ug via ORAL
  Filled 2012-02-28 (×2): qty 1

## 2012-02-28 MED ORDER — ONDANSETRON HCL 4 MG/2ML IJ SOLN
INTRAMUSCULAR | Status: DC | PRN
  Administered 2012-02-28: 4 mg via INTRAVENOUS

## 2012-02-28 MED ORDER — METHOCARBAMOL 500 MG PO TABS
500.0000 mg | ORAL_TABLET | Freq: Four times a day (QID) | ORAL | Status: DC | PRN
  Administered 2012-02-29 – 2012-03-01 (×4): 500 mg via ORAL
  Filled 2012-02-28 (×4): qty 1

## 2012-02-28 MED ORDER — MIDAZOLAM HCL 2 MG/2ML IJ SOLN: 0.5000 mg | Freq: Once | INTRAMUSCULAR | Status: AC | PRN

## 2012-02-28 MED ORDER — SODIUM CHLORIDE 0.9 % IV SOLN: INTRAVENOUS | Status: DC

## 2012-02-28 MED ORDER — HYDROMORPHONE HCL PF 1 MG/ML IJ SOLN
1.0000 mg | Freq: Once | INTRAMUSCULAR | Status: AC
  Administered 2012-02-28: 1 mg via INTRAVENOUS
  Filled 2012-02-28: qty 1

## 2012-02-28 MED ORDER — HYDROMORPHONE HCL PF 1 MG/ML IJ SOLN
1.0000 mg | INTRAMUSCULAR | Status: DC | PRN
  Administered 2012-02-29: 2 mg via INTRAVENOUS
  Filled 2012-02-28: qty 2

## 2012-02-28 MED ORDER — DEXTROSE 5 % IV SOLN
INTRAVENOUS | Status: DC | PRN
  Administered 2012-02-28: 20:00:00 via INTRAVENOUS

## 2012-02-28 MED ORDER — CEFAZOLIN SODIUM-DEXTROSE 2-3 GM-% IV SOLR
2.0000 g | Freq: Four times a day (QID) | INTRAVENOUS | Status: AC
  Administered 2012-02-29 (×3): 2 g via INTRAVENOUS
  Filled 2012-02-28 (×3): qty 50

## 2012-02-28 MED ORDER — MEPERIDINE HCL 25 MG/ML IJ SOLN: 6.2500 mg | INTRAMUSCULAR | Status: DC | PRN

## 2012-02-28 MED ORDER — OXYCODONE-ACETAMINOPHEN 5-325 MG PO TABS
1.0000 | ORAL_TABLET | ORAL | Status: DC | PRN
  Administered 2012-02-29 – 2012-03-01 (×6): 2 via ORAL
  Filled 2012-02-28 (×6): qty 2

## 2012-02-28 MED ORDER — LIDOCAINE HCL 1 % IJ SOLN
INTRAMUSCULAR | Status: DC | PRN
  Administered 2012-02-28: 20 mg via INTRADERMAL

## 2012-02-28 MED ORDER — POLYETHYLENE GLYCOL 3350 17 G PO PACK
17.0000 g | PACK | Freq: Every day | ORAL | Status: DC | PRN
  Filled 2012-02-28: qty 1

## 2012-02-28 MED ORDER — SODIUM CHLORIDE 0.9 % IV SOLN
INTRAVENOUS | Status: DC
  Administered 2012-02-29: 01:00:00 via INTRAVENOUS

## 2012-02-28 MED ORDER — DEXAMETHASONE SODIUM PHOSPHATE 4 MG/ML IJ SOLN
INTRAMUSCULAR | Status: DC | PRN
  Administered 2012-02-28: 4 mg via INTRAVENOUS

## 2012-02-28 MED ORDER — OXYCODONE HCL 5 MG PO TABS: 5.0000 mg | ORAL_TABLET | Freq: Once | ORAL | Status: AC | PRN

## 2012-02-28 MED ORDER — MORPHINE SULFATE 2 MG/ML IJ SOLN
2.0000 mg | INTRAMUSCULAR | Status: DC | PRN
  Administered 2012-02-28 (×2): 2 mg via INTRAVENOUS
  Filled 2012-02-28 (×2): qty 1

## 2012-02-28 MED ORDER — PROPOFOL 10 MG/ML IV BOLUS
INTRAVENOUS | Status: DC | PRN
  Administered 2012-02-28: 150 mg via INTRAVENOUS

## 2012-02-28 MED ORDER — FENTANYL CITRATE 0.05 MG/ML IJ SOLN
INTRAMUSCULAR | Status: DC | PRN
  Administered 2012-02-28 (×2): 50 ug via INTRAVENOUS
  Administered 2012-02-28: 200 ug via INTRAVENOUS
  Administered 2012-02-28: 50 ug via INTRAVENOUS

## 2012-02-28 MED ORDER — BISACODYL 5 MG PO TBEC
5.0000 mg | DELAYED_RELEASE_TABLET | Freq: Every day | ORAL | Status: DC | PRN
  Filled 2012-02-28: qty 1

## 2012-02-28 MED ORDER — LIDOCAINE HCL 4 % MT SOLN
OROMUCOSAL | Status: DC | PRN
  Administered 2012-02-28: 4 mL via TOPICAL

## 2012-02-28 MED ORDER — ARTIFICIAL TEARS OP OINT
TOPICAL_OINTMENT | OPHTHALMIC | Status: DC | PRN
  Administered 2012-02-28: 1 via OPHTHALMIC

## 2012-02-28 MED ORDER — DEXTROSE 5 % IV SOLN
3.0000 g | INTRAVENOUS | Status: DC | PRN
  Administered 2012-02-28: 3 g via INTRAVENOUS

## 2012-02-28 SURGICAL SUPPLY — 67 items
3.2MM X 460MM COCR THD TIP WIRE ×2 IMPLANT
BANDAGE ELASTIC 4 VELCRO ST LF (GAUZE/BANDAGES/DRESSINGS) IMPLANT
BANDAGE ELASTIC 6 VELCRO ST LF (GAUZE/BANDAGES/DRESSINGS) IMPLANT
BANDAGE ESMARK 6X9 LF (GAUZE/BANDAGES/DRESSINGS) IMPLANT
BANDAGE GAUZE ELAST BULKY 4 IN (GAUZE/BANDAGES/DRESSINGS) IMPLANT
BIT DRILL CALIBRATED 4.3MMX365 (DRILL) ×1 IMPLANT
BIT DRILL CROWE PNT TWST 4.5MM (DRILL) ×1 IMPLANT
BLADE SURG 15 STRL LF DISP TIS (BLADE) ×1 IMPLANT
BLADE SURG 15 STRL SS (BLADE) ×1
BLADE SURG ROTATE 9660 (MISCELLANEOUS) IMPLANT
BNDG COHESIVE 6X5 TAN STRL LF (GAUZE/BANDAGES/DRESSINGS) IMPLANT
BNDG ESMARK 6X9 LF (GAUZE/BANDAGES/DRESSINGS)
CLOTH BEACON ORANGE TIMEOUT ST (SAFETY) ×2 IMPLANT
CUFF TOURNIQUET SINGLE 34IN LL (TOURNIQUET CUFF) IMPLANT
CUFF TOURNIQUET SINGLE 44IN (TOURNIQUET CUFF) IMPLANT
DRAPE C-ARM 42X72 X-RAY (DRAPES) ×2 IMPLANT
DRAPE ORTHO SPLIT 77X108 STRL (DRAPES) ×3
DRAPE PROXIMA HALF (DRAPES) ×6 IMPLANT
DRAPE SURG ORHT 6 SPLT 77X108 (DRAPES) ×3 IMPLANT
DRAPE U-SHAPE 47X51 STRL (DRAPES) ×2 IMPLANT
DRILL CALIBRATED 4.3MMX365 (DRILL) ×2
DRILL CROWE POINT TWIST 4.5MM (DRILL) ×2
DRSG ADAPTIC 3X8 NADH LF (GAUZE/BANDAGES/DRESSINGS) ×2 IMPLANT
DRSG MEPILEX BORDER 4X4 (GAUZE/BANDAGES/DRESSINGS) ×2 IMPLANT
DRSG PAD ABDOMINAL 8X10 ST (GAUZE/BANDAGES/DRESSINGS) ×4 IMPLANT
DURAPREP 26ML APPLICATOR (WOUND CARE) ×4 IMPLANT
ELECT REM PT RETURN 9FT ADLT (ELECTROSURGICAL) ×2
ELECTRODE REM PT RTRN 9FT ADLT (ELECTROSURGICAL) ×1 IMPLANT
GAUZE XEROFORM 1X8 LF (GAUZE/BANDAGES/DRESSINGS) IMPLANT
GLOVE BIOGEL PI IND STRL 8 (GLOVE) ×2 IMPLANT
GLOVE BIOGEL PI INDICATOR 8 (GLOVE) ×2
GLOVE ECLIPSE 7.5 STRL STRAW (GLOVE) ×4 IMPLANT
GOWN PREVENTION PLUS XLARGE (GOWN DISPOSABLE) ×2 IMPLANT
GOWN SRG XL XLNG 56XLVL 4 (GOWN DISPOSABLE) ×1 IMPLANT
GOWN STRL NON-REIN LRG LVL3 (GOWN DISPOSABLE) ×4 IMPLANT
GOWN STRL NON-REIN XL XLG LVL4 (GOWN DISPOSABLE) ×1
GUIDEWIRE BEAD TIP (WIRE) ×2 IMPLANT
IMMOBILIZER KNEE 24 THIGH 36 (MISCELLANEOUS) ×1 IMPLANT
IMMOBILIZER KNEE 24 UNIV (MISCELLANEOUS) ×2
KIT BASIN OR (CUSTOM PROCEDURE TRAY) ×2 IMPLANT
KIT ROOM TURNOVER OR (KITS) ×2 IMPLANT
MANIFOLD NEPTUNE II (INSTRUMENTS) ×2 IMPLANT
NAIL FEM RETRO 10.5X360 (Nail) ×2 IMPLANT
NEEDLE 22X1 1/2 (OR ONLY) (NEEDLE) IMPLANT
NS IRRIG 1000ML POUR BTL (IV SOLUTION) ×2 IMPLANT
PACK GENERAL/GYN (CUSTOM PROCEDURE TRAY) ×2 IMPLANT
PAD ARMBOARD 7.5X6 YLW CONV (MISCELLANEOUS) ×4 IMPLANT
PAD CAST 4YDX4 CTTN HI CHSV (CAST SUPPLIES) ×1 IMPLANT
PADDING CAST COTTON 4X4 STRL (CAST SUPPLIES) ×1
PADDING CAST COTTON 6X4 STRL (CAST SUPPLIES) ×2 IMPLANT
SCREW CORT TI DBL LEAD 5X36 (Screw) ×4 IMPLANT
SCREW CORT TI DBL LEAD 5X65 (Screw) ×2 IMPLANT
SCREW CORT TI DBL LEAD 5X70 (Screw) ×2 IMPLANT
SCREW CORT TI DBL LEAD 5X80 (Screw) ×4 IMPLANT
SPONGE GAUZE 4X4 12PLY (GAUZE/BANDAGES/DRESSINGS) ×2 IMPLANT
STAPLER VISISTAT 35W (STAPLE) IMPLANT
STOCKINETTE IMPERVIOUS LG (DRAPES) ×2 IMPLANT
SUT ETHIBOND CT1 BRD #0 30IN (SUTURE) ×2 IMPLANT
SUT ETHILON 3 0 PS 1 (SUTURE) ×2 IMPLANT
SUT VIC AB 0 CT1 27 (SUTURE) ×1
SUT VIC AB 0 CT1 27XBRD ANBCTR (SUTURE) ×1 IMPLANT
SUT VIC AB 0 CTB1 27 (SUTURE) IMPLANT
SUT VIC AB 2-0 CTB1 (SUTURE) IMPLANT
SYR CONTROL 10ML LL (SYRINGE) IMPLANT
TOWEL OR 17X24 6PK STRL BLUE (TOWEL DISPOSABLE) ×2 IMPLANT
TOWEL OR 17X26 10 PK STRL BLUE (TOWEL DISPOSABLE) ×2 IMPLANT
WATER STERILE IRR 1000ML POUR (IV SOLUTION) IMPLANT

## 2012-02-28 NOTE — Progress Notes (Signed)
Subjective: Continued severe pain with femur fracture above tkr   Objective: Vital signs in last 24 hours: Temp:  [97.5 F (36.4 C)-97.8 F (36.6 C)] 97.5 F (36.4 C) (02/27 1430) Pulse Rate:  [77-85] 84 (02/27 1822) Resp:  [18] 18 (02/27 1822) BP: (127-147)/(64-75) 134/75 mmHg (02/27 1430) SpO2:  [96 %-100 %] 100 % (02/27 1822) Weight:  [99.791 kg (220 lb)] 99.791 kg (220 lb) (02/27 0900)  Intake/Output from previous day:   Intake/Output this shift:     Recent Labs  02/28/12 0708  HGB 12.0    Recent Labs  02/28/12 0708  WBC 11.4*  RBC 4.30  HCT 32.9*  PLT 240    Recent Labs  02/28/12 0708  NA 135  K 3.6  CL 97  CO2 28  BUN 14  CREATININE 1.10  GLUCOSE 98  CALCIUM 9.2   No results found for this basename: LABPT, INR,  in the last 72 hours  exam Well-developed well-nourished patient in no acute distress. Alert and oriented x3 HEENT:within normal limits Cardiac: Regular rate and rhythm Pulmonary: Lungs clear to auscultation Abdomen: Soft and nontender.  Normal active bowel sounds  Musculoskeletal: (r knee swollen and painful.  Painful ROMw) Assessment/Plan: femaale S/P tkr in December who had a hard fall on r. Knee last night suffering supracondylar femur fracture.// needs orif when room available.  This procedure has been fully reviewed with the patient and written informed consent has been obtained.  She understands risks and benefits of surgery and wishes to proceed.  Lynnox Girten L 02/28/2012, 8:12 PM

## 2012-02-28 NOTE — ED Notes (Signed)
Per EMs pt fell approx 1 hrs ago off of the bottom step of a flight of stairs. After falling pt fell a "pop" in the right knee, pt had right knee replacement in December 2014. Pt also c/o right hip pain, per EMS no external/internal rotation, EMS unable to determine if shortening is present. Per EMS pt walked up a flight of stairs back to her apartment after the fall to call EMS. NKA, pt in obvious pain at this time.

## 2012-02-28 NOTE — Transfer of Care (Signed)
Immediate Anesthesia Transfer of Care Note  Patient: Tiffany Yu  Procedure(s) Performed: Procedure(s): OPEN REDUCTION INTERNAL FIXATION (ORIF) RIGHT KNEE (Right)  Patient Location: PACU  Anesthesia Type:General  Level of Consciousness: awake, alert , oriented and patient cooperative  Airway & Oxygen Therapy: Patient Spontanous Breathing and Patient connected to nasal cannula oxygen  Post-op Assessment: Report given to PACU RN and Post -op Vital signs reviewed and stable  Post vital signs: Reviewed and stable  Complications: No apparent anesthesia complications

## 2012-02-28 NOTE — ED Notes (Signed)
Pt provided ice pack, talking on phone with family, resting.

## 2012-02-28 NOTE — ED Provider Notes (Signed)
History     CSN: [id number]  Arrival date & time 02/28/12  0402   First MD Initiated Contact with Patient 02/28/12 0422      Chief Complaint  Patient presents with  . Fall    (Consider location/radiation/quality/duration/timing/severity/associated sxs/prior treatment) HPI Hx per PT - R knee pain after fall today.  Walking down stairs and missed the last step landing R knee and hip now with severe R knee pain.  She had total knee replacement in Dec 2013 by Dr Luiz Blare.  Some inc swelling.  No numbness/ tingling. Hurts to move, no weakness. No other pain or trauma. No head or neck injury.  Past Medical History  Diagnosis Date  . Hypertension   . Hypothyroidism   . Anxiety   . Depression   . Arthritis   . Refusal of blood transfusions as patient is Jehovah's Witness     Past Surgical History  Procedure Laterality Date  . Cholecystectomy    . Tendon release      right wrist  . Knee arthroscopy      Right  . Plantar fascia surgery      right foot  . Dilation and curettage of uterus    . Total knee arthroplasty  12/14/2011    right knee  . Knee arthroplasty  12/14/2011    Procedure: COMPUTER ASSISTED TOTAL KNEE ARTHROPLASTY;  Surgeon: Harvie Junior, MD;  Location: [facility] OR;  Service: Orthopedics;  Laterality: Right;    History reviewed. No pertinent family history.  History  Substance Use Topics  . Smoking status: Never Smoker   . Smokeless tobacco: Never Used  . Alcohol Use: Yes     Comment: very rarely    OB History   Grav Para Term Preterm Abortions TAB SAB Ect Mult Living                  Review of Systems  Constitutional: Negative for fever and chills.  HENT: Negative for neck pain and neck stiffness.   Eyes: Negative for pain.  Respiratory: Negative for shortness of breath.   Cardiovascular: Negative for chest pain.  Gastrointestinal: Negative for abdominal pain.  Genitourinary: Negative for flank pain.  Musculoskeletal: Negative for back pain.  Skin:  Negative for rash.  Neurological: Negative for headaches.  All other systems reviewed and are negative.    Allergies  Review of patient's allergies indicates no known allergies.  Home Medications   Current Outpatient Rx  Name  Route  Sig  Dispense  Refill  . escitalopram (LEXAPRO) 10 MG tablet   Oral   Take 10 mg by mouth daily.         Marland Kitchen estradiol (ESTRACE) 1 MG tablet   Oral   Take 1 mg by mouth daily.         Marland Kitchen HYDROmorphone (DILAUDID) 2 MG tablet   Oral   Take 2 mg by mouth every 8 (eight) hours as needed. For pain         . levothyroxine (SYNTHROID, LEVOTHROID) 50 MCG tablet   Oral   Take 50 mcg by mouth daily.         . methocarbamol (ROBAXIN) 500 MG tablet   Oral   Take 500 mg by mouth 2 (two) times daily as needed. For knee pain         . nebivolol (BYSTOLIC) 5 MG tablet   Oral   Take 5 mg by mouth daily.         Marland Kitchen  zolpidem (AMBIEN) 10 MG tablet   Oral   Take 10 mg by mouth at bedtime as needed. For sleep.           BP 147/64  Pulse 77  Temp(Src) 97.8 F (36.6 C) (Oral)  Resp 18  SpO2 98%  Physical Exam  Constitutional: She is oriented to person, place, and time. She appears well-developed and well-nourished.  HENT:  Head: Normocephalic and atraumatic.  Eyes: EOM are normal. Pupils are equal, round, and reactive to light.  Neck: Neck supple.  No c spine tenderness  Cardiovascular: Normal rate, regular rhythm and intact distal pulses.   Pulmonary/Chest: Effort normal and breath sounds normal. No respiratory distress.  Musculoskeletal:  RLE: TTP over patella with healing surgical scar, swelling present. Mild TTP over greater trochanter, dec ROM 2/2 pain. Distal N/V intact  Neurological: She is alert and oriented to person, place, and time.  Skin: Skin is warm and dry.    ED Course  Procedures (including critical care time)    Dg Hip Complete Right  02/28/2012  *RADIOLOGY REPORT*  Clinical Data: Pain in the right hip and  difficulty straightening the leg after fall.  RIGHT HIP - COMPLETE 2+ VIEW  Comparison: None.  Findings: Degenerative changes in the right hip with narrowing of the joint space and superior acetabular sclerosis.  Hypertrophic changes on the femoral and acetabular sides.  No displaced fractures are demonstrated.  Degenerative changes in the left hip. The pelvis appears intact.  SI joints, symphysis pubis, and pelvic rim are not displaced.  IMPRESSION: Degenerative changes in the right hip.  No displaced fractures identified.   Original Report Authenticated By: Burman Nieves, M.D.     Dg Knee Complete 4 Views Right  02/28/2012  *RADIOLOGY REPORT*  Clinical Data: Right knee pain after fall.  Decreased range of motion.  RIGHT KNEE - COMPLETE 4+ VIEW  Comparison: None.  Findings: There is a right total knee arthroplasty with patellofemoral component.  There is an acute fracture of the right femoral metaphysis superior to the arthroplasty with posterior displacement and angulation with overriding of the distal fracture fragment.  There is a right knee effusion.  The tibial component of the arthroplasty appears well seated.  Patella is unremarkable.  IMPRESSION: Right distal femoral metaphyseal fracture at the proximal aspect of the arthroplasty component with posterior displacement, overriding, and angulation of the distal fracture fragment.   Original Report Authenticated By: Burman Nieves, M.D.     IV Dilaudid. IVfs. IV zofran.   7:09 AM d/w Dr Ave Filter, ortho, will see in the ED, recs pillow under knee for now and pain control.   MDM  R femur fracture  Xrays, labs  IV narcotics. IVfs. NPO  Ortho dispo       Sunnie Nielsen, MD 02/28/12 [phone]

## 2012-02-28 NOTE — ED Notes (Signed)
Attempt to call report x 1  

## 2012-02-28 NOTE — Preoperative (Signed)
Beta Blockers   Reason not to administer Beta Blockers:bystolic 02/28/12 7829

## 2012-02-28 NOTE — H&P (Signed)
Tiffany Yu is an 57 y.o. female.   Chief Complaint: right knee pain after fall HPI: 57 year old female s/p right total knee arthroplasty by Dr. Luiz Blare Dec 14, 2011. Post operative course without complications. Fell on right knee while walking down steps at 1am this morning. Had immediate pain and swelling. Taken to ED. Now, complains of right knee pain and soreness, more controlled with pain medication and is comfortable. Worse with movement, better with rest in flexed position. No other complaints or concerns of injury.  Past Medical History  Diagnosis Date  . Hypertension   . Hypothyroidism   . Anxiety   . Depression   . Arthritis   . Refusal of blood transfusions as patient is Jehovah's Witness     Past Surgical History  Procedure Laterality Date  . Cholecystectomy    . Tendon release      right wrist  . Knee arthroscopy      Right  . Plantar fascia surgery      right foot  . Dilation and curettage of uterus    . Total knee arthroplasty  12/14/2011    right knee  . Knee arthroplasty  12/14/2011    Procedure: COMPUTER ASSISTED TOTAL KNEE ARTHROPLASTY;  Surgeon: Harvie Junior, MD;  Location: [facility] OR;  Service: Orthopedics;  Laterality: Right;    History reviewed. No pertinent family history. Social History:  reports that she has never smoked. She has never used smokeless tobacco. She reports that  drinks alcohol. She reports that she does not use illicit drugs.  Allergies: No Known Allergies   (Not in a hospital admission)  No results found for this or any previous visit (from the past 48 hour(s)). Dg Hip Complete Right  02/28/2012  *RADIOLOGY REPORT*  Clinical Data: Pain in the right hip and difficulty straightening the leg after fall.  RIGHT HIP - COMPLETE 2+ VIEW  Comparison: None.  Findings: Degenerative changes in the right hip with narrowing of the joint space and superior acetabular sclerosis.  Hypertrophic changes on the femoral and acetabular sides.  No displaced  fractures are demonstrated.  Degenerative changes in the left hip. The pelvis appears intact.  SI joints, symphysis pubis, and pelvic rim are not displaced.  IMPRESSION: Degenerative changes in the right hip.  No displaced fractures identified.   Original Report Authenticated By: Burman Nieves, M.D.    Dg Knee Complete 4 Views Right  02/28/2012  *RADIOLOGY REPORT*  Clinical Data: Right knee pain after fall.  Decreased range of motion.  RIGHT KNEE - COMPLETE 4+ VIEW  Comparison: None.  Findings: There is a right total knee arthroplasty with patellofemoral component.  There is an acute fracture of the right femoral metaphysis superior to the arthroplasty with posterior displacement and angulation with overriding of the distal fracture fragment.  There is a right knee effusion.  The tibial component of the arthroplasty appears well seated.  Patella is unremarkable.  IMPRESSION: Right distal femoral metaphyseal fracture at the proximal aspect of the arthroplasty component with posterior displacement, overriding, and angulation of the distal fracture fragment.   Original Report Authenticated By: Burman Nieves, M.D.     Review of Systems  Musculoskeletal:       Right knee pain, stiffness, tightness  All other systems reviewed and are negative.    Blood pressure 147/64, pulse 77, temperature 97.8 F (36.6 C), temperature source Oral, resp. rate 18, SpO2 98.00%. Physical Exam  Constitutional: She is oriented to person, place, and time.  She appears well-developed and well-nourished.  HENT:  Head: Normocephalic and atraumatic.  Eyes: EOM are normal. Pupils are equal, round, and reactive to light.  Respiratory: Effort normal and breath sounds normal.  Musculoskeletal:  Right knee with swelling, tenderness to palpation medial and lateral joint line. Small abrasions to scar. Prominence of surgical scar but intact and no evidence of infection. Per patient scar is unchanged from todays incident. Wiggles  toes, intact sensation to light touch, palpable DP PT pulses, cap refill <2 secs  Neurological: She is alert and oriented to person, place, and time.  Skin: Skin is warm and dry.  Psychiatric: She has a normal mood and affect. Her behavior is normal. Judgment and thought content normal.     Assessment/Plan Right distal femoral metaphyseal fracture at the proximal aspect of hardware with posterior displacement, overriding and angulation of the distal fracture fragment Will admit to floor today  Dr. Luiz Blare will assume care today Will keep NPO for anticipation of surgery Continue pillow under flexed knee for comfort   Tania Perrott 02/28/2012, 7:26 AM

## 2012-02-28 NOTE — Anesthesia Preprocedure Evaluation (Addendum)
Anesthesia Evaluation  Patient identified by MRN, date of birth, ID band Patient awake    Reviewed: Allergy & Precautions, H&P , NPO status , Patient's Chart, lab work & pertinent test results, reviewed documented beta blocker date and time   Airway Mallampati: II TM Distance: >3 FB Neck ROM: Full    Dental  (+) Edentulous Upper, Edentulous Lower and Dental Advisory Given   Pulmonary neg pulmonary ROS,  breath sounds clear to auscultation  Pulmonary exam normal       Cardiovascular hypertension, Pt. on medications and Pt. on home beta blockers Rhythm:Regular Rate:Normal     Neuro/Psych PSYCHIATRIC DISORDERS Anxiety Depression negative neurological ROS     GI/Hepatic negative GI ROS, Neg liver ROS,   Endo/Other  Hypothyroidism Morbid obesity  Renal/GU negative Renal ROS     Musculoskeletal  (+) Arthritis -, Osteoarthritis,    Abdominal (+) + obese,   Peds  Hematology  (+) Blood dyscrasia, Sickle cell trait , JEHOVAH'S WITNESS (no products, ok with cellsaver, accepts albumin)  Anesthesia Other Findings   Reproductive/Obstetrics negative OB ROS                          Anesthesia Physical Anesthesia Plan  ASA: II  Anesthesia Plan: General   Post-op Pain Management:    Induction: Intravenous  Airway Management Planned: Oral ETT  Additional Equipment:   Intra-op Plan:   Post-operative Plan: Extubation in OR  Informed Consent: I have reviewed the patients History and Physical, chart, labs and discussed the procedure including the risks, benefits and alternatives for the proposed anesthesia with the patient or authorized representative who has indicated his/her understanding and acceptance.     Plan Discussed with: CRNA and Surgeon  Anesthesia Plan Comments: (Plan routine monitors, GETA)        Anesthesia Quick Evaluation

## 2012-02-28 NOTE — Brief Op Note (Signed)
02/28/2012  10:25 PM  PATIENT:  Delecia J Wilcher  57 y.o. female  PRE-OPERATIVE DIAGNOSIS:  Right femur fracture  POST-OPERATIVE DIAGNOSIS:  Right femur fracture  PROCEDURE:  Procedure(s): OPEN REDUCTION INTERNAL FIXATION (ORIF) RIGHT KNEE (Right)  SURGEON:  Surgeon(s) and Role:    * Harvie Junior, MD - Primary  PHYSICIAN ASSISTANT:   ASSISTANTS: bethune   ANESTHESIA:   general  EBL:  Total I/O In: 1350 [I.V.:1350] Out: 1150 [Urine:900; Blood:250]  BLOOD ADMINISTERED:none  DRAINS: none   LOCAL MEDICATIONS USED:  NONE  SPECIMEN:  No Specimen  DISPOSITION OF SPECIMEN:  N/A  COUNTS:  YES  TOURNIQUET:  * No tourniquets in log *  DICTATION: .Other Dictation: Dictation Number [id number]  PLAN OF CARE: Admit to inpatient   PATIENT DISPOSITION:  PACU - hemodynamically stable.   Delay start of Pharmacological VTE agent (>24hrs) due to surgical blood loss or risk of bleeding: no

## 2012-02-28 NOTE — Anesthesia Postprocedure Evaluation (Signed)
  Anesthesia Post-op Note  Patient: Tiffany Yu  Procedure(s) Performed: Procedure(s): OPEN REDUCTION INTERNAL FIXATION (ORIF) RIGHT KNEE (Right)  Patient Location: PACU  Anesthesia Type:General  Level of Consciousness: awake, alert , oriented and patient cooperative  Airway and Oxygen Therapy: Patient Spontanous Breathing and Patient connected to nasal cannula oxygen  Post-op Pain: mild  Post-op Assessment: Post-op Vital signs reviewed, Patient's Cardiovascular Status Stable, Respiratory Function Stable, Patent Airway, No signs of Nausea or vomiting and Pain level controlled  Post-op Vital Signs: Reviewed and stable  Complications: No apparent anesthesia complications

## 2012-02-28 NOTE — ED Notes (Signed)
MD at bedside. 

## 2012-02-28 NOTE — H&P (Signed)
Discussed with Dr. Luiz Blare who will take over care of this injury today.

## 2012-02-28 NOTE — Anesthesia Procedure Notes (Signed)
Procedure Name: Intubation Date/Time: 02/28/2012 8:23 PM Performed by: Julianne Rice Z Pre-anesthesia Checklist: Patient identified, Timeout performed, Emergency Drugs available, Suction available and Patient being monitored Patient Re-evaluated:Patient Re-evaluated prior to inductionOxygen Delivery Method: Circle system utilized Preoxygenation: Pre-oxygenation with 100% oxygen Intubation Type: IV induction Ventilation: Mask ventilation without difficulty Laryngoscope Size: Mac and 3 Grade View: Grade I Tube size: 7.5 mm Number of attempts: 1 Airway Equipment and Method: Stylet and LTA kit utilized Placement Confirmation: ETT inserted through vocal cords under direct vision,  breath sounds checked- equal and bilateral and positive ETCO2 Secured at: 2 cm Tube secured with: Tape Dental Injury: Teeth and Oropharynx as per pre-operative assessment

## 2012-02-29 DIAGNOSIS — S72401A Unspecified fracture of lower end of right femur, initial encounter for closed fracture: Secondary | ICD-10-CM

## 2012-02-29 LAB — COMPREHENSIVE METABOLIC PANEL
Alkaline Phosphatase: 105 U/L (ref 39–117)
BUN: 9 mg/dL (ref 6–23)
CO2: 28 mEq/L (ref 19–32)
GFR calc Af Amer: 74 mL/min — ABNORMAL LOW (ref >90)
GFR calc non Af Amer: 64 mL/min — ABNORMAL LOW (ref >90)
Glucose, Bld: 130 mg/dL — ABNORMAL HIGH (ref 70–99)
Potassium: 4.2 mEq/L (ref 3.5–5.1)
Total Bilirubin: 0.4 mg/dL (ref 0.3–1.2)
Total Protein: 7 g/dL (ref 6.0–8.3)

## 2012-02-29 MED ORDER — WARFARIN VIDEO
Freq: Once | Status: AC
  Administered 2012-02-29: 14:00:00

## 2012-02-29 MED ORDER — OXYCODONE-ACETAMINOPHEN 5-325 MG PO TABS: 1.0000 | ORAL_TABLET | ORAL | Status: DC | PRN

## 2012-02-29 MED ORDER — COUMADIN BOOK
Freq: Once | Status: AC
  Administered 2012-02-29: 06:00:00
  Filled 2012-02-29: qty 1

## 2012-02-29 MED ORDER — METHOCARBAMOL 500 MG PO TABS: 500.0000 mg | ORAL_TABLET | Freq: Four times a day (QID) | ORAL | Status: DC | PRN

## 2012-02-29 MED ORDER — WARFARIN SODIUM 5 MG PO TABS: ORAL_TABLET | ORAL | Status: DC

## 2012-02-29 MED ORDER — WARFARIN SODIUM 7.5 MG PO TABS
7.5000 mg | ORAL_TABLET | Freq: Once | ORAL | Status: AC
  Administered 2012-02-29: 7.5 mg via ORAL
  Filled 2012-02-29: qty 1

## 2012-02-29 MED ORDER — WARFARIN - PHARMACIST DOSING INPATIENT: Freq: Every day | Status: DC

## 2012-02-29 NOTE — Progress Notes (Signed)
Utilization review completed. Rabiah Goeser, RN, BSN. 

## 2012-02-29 NOTE — Progress Notes (Signed)
ANTICOAGULATION CONSULT NOTE - Initial Consult  Pharmacy Consult for Coumadin Indication: VTE prophylaxis  No Known Allergies  Patient Measurements: Height: 5\' 8"  (172.7 cm) Weight: 220 lb (99.791 kg) IBW/kg (Calculated) : 63.9  Vital Signs: Temp: 98.4 F (36.9 C) (02/28 0616) Temp src: Oral (02/28 0616) BP: 121/67 mmHg (02/28 0616) Pulse Rate: 82 (02/28 0616)  Labs:  Recent Labs  02/28/12 0708 02/29/12 0510  HGB 12.0  --   HCT 32.9*  --   PLT 240  --   LABPROT  --  14.7  INR  --  1.17  CREATININE 1.10 0.96    Estimated Creatinine Clearance: 79 ml/min (by C-G formula based on Cr of 0.96).   Medical History: Past Medical History  Diagnosis Date  . Hypertension   . Hypothyroidism   . Anxiety   . Depression   . Arthritis   . Refusal of blood transfusions as patient is Jehovah's Witness     Medications:  Prescriptions prior to admission  Medication Sig Dispense Refill  . escitalopram (LEXAPRO) 10 MG tablet Take 10 mg by mouth daily.      Marland Kitchen estradiol (ESTRACE) 1 MG tablet Take 1 mg by mouth daily.      Marland Kitchen levothyroxine (SYNTHROID, LEVOTHROID) 50 MCG tablet Take 50 mcg by mouth daily.      . nebivolol (BYSTOLIC) 5 MG tablet Take 5 mg by mouth daily.      Marland Kitchen zolpidem (AMBIEN) 10 MG tablet Take 10 mg by mouth at bedtime as needed. For sleep.      . [DISCONTINUED] HYDROmorphone (DILAUDID) 2 MG tablet Take 2 mg by mouth every 8 (eight) hours as needed. For pain      . [DISCONTINUED] methocarbamol (ROBAXIN) 500 MG tablet Take 500 mg by mouth 2 (two) times daily as needed. For knee pain       Scheduled:  . [COMPLETED] acetaminophen  1,000 mg Intravenous Once  .  ceFAZolin (ANCEF) IV  2 g Intravenous Q6H  . [COMPLETED] coumadin book   Does not apply Once  . docusate sodium  100 mg Oral BID  . escitalopram  10 mg Oral Daily  . estradiol  1 mg Oral Daily  . [COMPLETED] fentaNYL  100 mcg Intravenous Once  . nebivolol  5 mg Oral Daily  . warfarin  7.5 mg Oral ONCE-1800   . warfarin   Does not apply Once  . Warfarin - Pharmacist Dosing Inpatient   Does not apply q1800  . [DISCONTINUED] aspirin EC  81 mg Oral Daily  . [DISCONTINUED] enoxaparin (LOVENOX) injection  30 mg Subcutaneous Q12H  . [DISCONTINUED] levothyroxine  50 mcg Oral Daily  . [DISCONTINUED] levothyroxine  50 mcg Oral QAC breakfast    Assessment: 57yo female s/p TKA of right knee in Dec 2013 now c/o fall landing on right knee, found with distal femoral metaphyseal fracture, now s/p ORIF, to begin Coumadin for VTE Px. Baseline INR 1.17. Patient was on coumadin in Dec 2013 and received 7.5mg  x 2 doses before being discharged (INR at that time was 1.77).   Goal of Therapy:  INR 1.5-2 per MD   Plan:  Coumadin 7.5mg  PO x 1 dose today at 1800  F/u daily PT/INR F/u resumption of home meds (synthroid)  Thank you,  Brett Fairy, PharmD, BCPS 02/29/2012 10:12 AM

## 2012-02-29 NOTE — Progress Notes (Signed)
ANTICOAGULATION CONSULT NOTE - Initial Consult  Pharmacy Consult for Coumadin Indication: VTE prophylaxis  No Known Allergies  Patient Measurements: Height: 5\' 8"  (172.7 cm) Weight: 220 lb (99.791 kg) IBW/kg (Calculated) : 63.9  Vital Signs: Temp: 98 F (36.7 C) (02/27 2315) BP: 121/59 mmHg (02/27 2315) Pulse Rate: 95 (02/27 2315)  Labs:  Recent Labs  02/28/12 0708  HGB 12.0  HCT 32.9*  PLT 240  CREATININE 1.10    Estimated Creatinine Clearance: 68.9 ml/min (by C-G formula based on Cr of 1.1).   Medical History: Past Medical History  Diagnosis Date  . Hypertension   . Hypothyroidism   . Anxiety   . Depression   . Arthritis   . Refusal of blood transfusions as patient is Jehovah's Witness     Medications:  Prescriptions prior to admission  Medication Sig Dispense Refill  . escitalopram (LEXAPRO) 10 MG tablet Take 10 mg by mouth daily.      Marland Kitchen estradiol (ESTRACE) 1 MG tablet Take 1 mg by mouth daily.      Marland Kitchen HYDROmorphone (DILAUDID) 2 MG tablet Take 2 mg by mouth every 8 (eight) hours as needed. For pain      . levothyroxine (SYNTHROID, LEVOTHROID) 50 MCG tablet Take 50 mcg by mouth daily.      . methocarbamol (ROBAXIN) 500 MG tablet Take 500 mg by mouth 2 (two) times daily as needed. For knee pain      . nebivolol (BYSTOLIC) 5 MG tablet Take 5 mg by mouth daily.      Marland Kitchen zolpidem (AMBIEN) 10 MG tablet Take 10 mg by mouth at bedtime as needed. For sleep.       Scheduled:  . acetaminophen  1,000 mg Intravenous Once  .  ceFAZolin (ANCEF) IV  2 g Intravenous Q6H  . docusate sodium  100 mg Oral BID  . escitalopram  10 mg Oral Daily  . estradiol  1 mg Oral Daily  . [COMPLETED] fentaNYL  100 mcg Intravenous Once  . [COMPLETED]  HYDROmorphone (DILAUDID) injection  1 mg Intravenous Once  . [COMPLETED]  HYDROmorphone (DILAUDID) injection  1 mg Intravenous Once  . nebivolol  5 mg Oral Daily  . [COMPLETED] ondansetron  4 mg Intravenous Once  . [DISCONTINUED] aspirin EC   81 mg Oral Daily  . [DISCONTINUED] enoxaparin (LOVENOX) injection  30 mg Subcutaneous Q12H  . [DISCONTINUED] levothyroxine  50 mcg Oral Daily  . [DISCONTINUED] levothyroxine  50 mcg Oral QAC breakfast    Assessment: 57yo female s/p TKA of right knee in Dec 2013 now c/o fall landing on right knee, found with distal femoral metaphyseal fracture, now s/p ORIF, to begin Coumadin for VTE Px.  Goal of Therapy:  INR 1.5-2   Plan:  Will obtain baseline INR with am labs and begin dosing this pm.  Begin Coumadin re-education.  Colleen Can PharmD BCPS 02/29/2012,12:06 AM

## 2012-02-29 NOTE — Op Note (Signed)
Tiffany Yu, Tiffany Yu                ACCOUNT NO.:  [id number]  MEDICAL RECORD NO.:  [id number]  LOCATION:  [id number]                        FACILITY:  [facility]  PHYSICIAN:  Harvie Junior, M.D.   DATE OF BIRTH:  1955-10-01  DATE OF PROCEDURE:  02/28/2012 DATE OF DISCHARGE:                              OPERATIVE REPORT   PREOPERATIVE DIAGNOSIS:  Supracondylar femur fracture above a total knee replacement.  POSTOPERATIVE DIAGNOSIS:  Supracondylar femur fracture above a total knee replacement.  PROCEDURE: 1. Open reduction and internal fixation of distal femurfracture.  2. Interpretation of multiple fluoroscopic images.   SURGEON:  Harvie Junior, M.D.  ASSISTANT:  Marshia Ly, PA.  ANESTHESIA:  General.  BRIEF HISTORY:  Tiffany Yu is a 57 year old female with a history of having had a total knee replacement done about 9 weeks ago.  She unfortunately was aroused in the middle of the night and got up and tried to walk down the stairs.  She got to the bottom stair and then suffered a fall.  At that point, she fell right on her knee and she had sudden onset of severe pain right at the level of the implant.  She was brought to the emergency room via ambulance, and at that point, we were consulted for treatment.  Dr. Ave Filter admitted her and we took her to the operating room for open reduction and internal fixation of her distal femur fracture.  DESCRIPTION OF PROCEDURE:  The patient was taken to the operating room and after adequate anesthesia was obtained with general anesthetic, the patient was placed supine on the operating table.  The right leg was then prepped and draped in the usual sterile fashion.  She was placed on a flat Jackson bed after she was taken to the operating room, and put to sleep.  After routine prep and drape, we used fluoro, did a manipulative closed reduction, essentially got an anatomic reduction on both AP and lateral.  At that point, I felt like I might be  able to do a limited exposure, held her on the triangle, made a small incision over the distal area, dissected down to the patellar tendon, sort of felt medial and lateral of the tendon, __________ it looked like we needed to be dead center, so I incised the tendon, and at that point, I could feel the post, I could feel the plastic, we pulled out little plastic tab and then put a guidewire straight up the fracture which was anatomically reduced.  We reamed it to 12 and once that was done, we took a 10-1/2 mm rod and advanced it up under fluoroscopic vision making sure that we could see the 4th hole come by the flat plate of the box, and once we had that, then we felt like we could really get good fixation.  At that point, we stopped the rod.  We looked at it on AP and lateral fluoro, knew that we were not proud of the box and locked the rod in place distally with 4 screws and then put a screw and locked to capture the screws distally, then went up proximally and freehand locked the screws up top, got  excellent purchase here, put it through a range of motion, had a range of motion of 0-115 degrees, irrigated the knee really copiously and thoroughly because of the concerns for elements that have been brought down to the knee.  Once that irrigation had been completed, we then closed the patellar tendon with 1 Ethibond, closed the skin with 0 and 2-0 Vicryl and then 4-0 nylon for the skin.  The little poke holes that had been made for each of the end locks were closed with 4-0 nylon.  The sterile and compressive dressing was applied and a knee immobilizer.  The patient was taken to recovery room and was noted to be in satisfactory condition.  Estimated blood loss for the procedure was probably 150 mL.     Harvie Junior, M.D.     Ranae Plumber  D:  02/28/2012  T:  02/29/2012  Job:  [id number]

## 2012-02-29 NOTE — Evaluation (Signed)
Physical Therapy Evaluation Patient Details Name: Tiffany Yu MRN: [id number] DOB: [birth year] Today's Date: 02/29/2012 Time: 7829-5621 PT Time Calculation (min): 36 min  PT Assessment / Plan / Recommendation Clinical Impression  Pt is a 57 y/o female s/p IM nail to R femur secondary to fall. Pt doing well with mobility and should be appropriate to d/c to home when cleared by MD.      PT Assessment  Patient needs continued PT services    Follow Up Recommendations  Home health PT    Does the patient have the potential to tolerate intense rehabilitation      Barriers to Discharge None      Equipment Recommendations  None recommended by PT    Recommendations for Other Services     Frequency Min 5X/week    Precautions / Restrictions Precautions Precautions: None Restrictions Weight Bearing Restrictions: Yes RLE Weight Bearing: Touchdown weight bearing    Pertinent Vitals/Pain Pt reporting 3-5/10 pain in knee.  Pt was premedicated prior to surgery.  Notified RN of pt's pain.        Mobility  Bed Mobility Bed Mobility: Supine to Sit;Sit to Supine Supine to Sit: 4: Min assist;HOB flat Sit to Supine: 4: Min assist;HOB flat Details for Bed Mobility Assistance: Assist for R LE secondary to pain and weakness.   VCs for technique.  Transfers Transfers: Sit to Stand;Stand to Sit Sit to Stand: 5: Supervision Stand to Sit: 5: Supervision Details for Transfer Assistance: VCs for hand placement and TDWB on RLE.   Ambulation/Gait Ambulation/Gait Assistance: 4: Min guard Ambulation Distance (Feet): 30 Feet Assistive device: Rolling walker Ambulation/Gait Assistance Details: VCs for gait sequencing and TDWB on RLE.   Gait Pattern: Step-to pattern Stairs: No Wheelchair Mobility Wheelchair Mobility: No    Exercises     PT Diagnosis: Difficulty walking;Generalized weakness;Acute pain  PT Problem List: Decreased strength;Decreased range of motion;Decreased activity  tolerance;Decreased mobility;Decreased knowledge of use of DME PT Treatment Interventions: Gait training;Stair training;DME instruction;Therapeutic activities;Therapeutic exercise   PT Goals Acute Rehab PT Goals PT Goal Formulation: With patient Time For Goal Achievement: 03/07/12 Potential to Achieve Goals: Good Pt will go Supine/Side to Sit: with modified independence PT Goal: Supine/Side to Sit - Progress: Goal set today Pt will go Sit to Supine/Side: with modified independence PT Goal: Sit to Supine/Side - Progress: Goal set today Pt will Transfer Bed to Chair/Chair to Bed: with modified independence PT Transfer Goal: Bed to Chair/Chair to Bed - Progress: Goal set today Pt will Ambulate: 16 - 50 feet;with modified independence;with rolling walker PT Goal: Ambulate - Progress: Goal set today  Visit Information  Last PT Received On: 02/29/12 Assistance Needed: +1    Subjective Data  Subjective: agree to PT eval   Prior Functioning  Home Living Lives With: Spouse Available Help at Discharge: Family;Available 24 hours/day Type of Home: Apartment Home Access: Stairs to enter Entrance Stairs-Number of Steps: 12 Entrance Stairs-Rails: Right;Left;Can reach both Home Layout: One level Bathroom Shower/Tub: Forensic scientist: Standard Home Adaptive Equipment: Walker - rolling;Straight cane;Bedside commode/3-in-1 Prior Function Level of Independence: Independent Able to Take Stairs?: Yes Driving: No Vocation: Unemployed Communication Communication: No difficulties    Cognition  Cognition Overall Cognitive Status: Appears within functional limits for tasks assessed/performed Arousal/Alertness: Awake/alert Orientation Level: Appears intact for tasks assessed    Extremity/Trunk Assessment Right Upper Extremity Assessment RUE ROM/Strength/Tone: Within functional levels Left Upper Extremity Assessment LUE ROM/Strength/Tone: Within functional levels Right  Lower Extremity Assessment RLE ROM/Strength/Tone:  Unable to fully assess;Due to pain;Due to precautions Left Lower Extremity Assessment LLE ROM/Strength/Tone: Within functional levels   Balance    End of Session PT - End of Session Equipment Utilized During Treatment: Gait belt Activity Tolerance: Patient tolerated treatment well Patient left: in chair;with call bell/phone within reach Nurse Communication: Mobility status  GP     Buckley Bradly 02/29/2012, 11:40 AM Theron Arista L. Malillany Kazlauskas DPT [phone]

## 2012-03-01 LAB — COMPREHENSIVE METABOLIC PANEL
Albumin: 2.8 g/dL — ABNORMAL LOW (ref 3.5–5.2)
Alkaline Phosphatase: 90 U/L (ref 39–117)
BUN: 12 mg/dL (ref 6–23)
Chloride: 102 mEq/L (ref 96–112)
GFR calc Af Amer: 54 mL/min — ABNORMAL LOW (ref >90)
Glucose, Bld: 110 mg/dL — ABNORMAL HIGH (ref 70–99)
Potassium: 3.5 mEq/L (ref 3.5–5.1)
Total Bilirubin: 0.2 mg/dL — ABNORMAL LOW (ref 0.3–1.2)

## 2012-03-01 NOTE — Progress Notes (Signed)
PATIENT ID: FRANCELIA MCLAREN  MRN: [id number]  DOB/AGE:  09-05-1955 / 57 y.o.  2 Days Post-Op Procedure(s) (LRB): OPEN REDUCTION INTERNAL FIXATION (ORIF) RIGHT KNEE (Right)    PROGRESS NOTE Subjective: Patient is alert, oriented, no Nausea, no Vomiting, yes passing gas, no Bowel Movement. Taking PO well. Denies SOB, Chest or Calf Pain. Using Incentive Spirometer, PAS in place. Ambulate well with PT (TDWB, immobilizer) Patient reports pain as moderate  .    Objective: Vital signs in last 24 hours: Filed Vitals:   02/29/12 0616 02/29/12 1430 02/29/12 2130 03/01/12 0600  BP: 121/67 100/44 104/56 102/51  Pulse: 82 86 84 76  Temp: 98.4 F (36.9 C) 98.1 F (36.7 C) 98.1 F (36.7 C) 97.9 F (36.6 C)  TempSrc: Oral     Resp: 18 18 18 18   Height:      Weight:      SpO2: 97% 100% 97% 100%      Intake/Output from previous day: I/O last 3 completed shifts: In: 2490 [P.O.:640; I.V.:1850] Out: 3725 [Urine:3450; Blood:275]   Intake/Output this shift:     LABORATORY DATA:  Recent Labs  02/28/12 0708 02/29/12 0510  WBC 11.4*  --   HGB 12.0  --   HCT 32.9*  --   PLT 240  --   NA 135 135  K 3.6 4.2  CL 97 101  CO2 28 28  BUN 14 9  CREATININE 1.10 0.96  GLUCOSE 98 130*  INR  --  1.17  CALCIUM 9.2 8.8    Examination: Neurologically intact ABD soft Neurovascular intact Sensation intact distally Intact pulses distally Dorsiflexion/Plantar flexion intact Incision: dressing C/D/I}  Assessment:   2 Days Post-Op Procedure(s) (LRB): OPEN REDUCTION INTERNAL FIXATION (ORIF) RIGHT KNEE (Right) ADDITIONAL DIAGNOSIS:  none  Plan: PT/OT WBAT, CPM 5/hrs day until ROM 0-90 degrees, then D/C CPM DVT Prophylaxis:  SCDx72hrs, ASA 325 mg BID x 2 weeks DISCHARGE PLAN: Home today DISCHARGE NEEDS: HHPT, HHRN, Walker and 3-in-1 comode seat     Johnathyn Viscomi M. 03/01/2012, 8:22 AM

## 2012-03-01 NOTE — Progress Notes (Signed)
NCM spoke to pt and states she had [facility] in the past. Requesting [facility] for [facility]. Contact info added to dc instructions. States no DME is needed at home. Faxed referral to [facility] for [facility] for scheduled dc home today. Isidoro Donning RN CCM Case Mgmt phone [phone]

## 2012-03-01 NOTE — Progress Notes (Signed)
Physical Therapy Treatment Patient Details Name: Tiffany Yu MRN: [id number] DOB: 1955-04-23 Today's Date: 03/01/2012 Time: 4098-1191 PT Time Calculation (min): 25 min  PT Assessment / Plan / Recommendation Comments on Treatment Session  Pt making great progress toward goals.    Follow Up Recommendations  Home health PT           Equipment Recommendations  None recommended by PT       Frequency Min 5X/week   Plan Discharge plan remains appropriate;Frequency remains appropriate    Precautions / Restrictions Precautions Precautions: Fall Required Braces or Orthoses: Knee Immobilizer - Right Knee Immobilizer - Right: On at all times Restrictions Weight Bearing Restrictions: Yes RLE Weight Bearing: Touchdown weight bearing       Mobility  Bed Mobility Supine to Sit: 6: Modified independent (Device/Increase time);HOB flat Sit to Supine: 6: Modified independent (Device/Increase time);HOB flat Details for Bed Mobility Assistance: bed flat and no rails used. No cues or assitance needed. Transfers Sit to Stand: 6: Modified independent (Device/Increase time);From bed;With upper extremity assist Stand to Sit: 6: Modified independent (Device/Increase time);To chair/3-in-1;With upper extremity assist;With armrests Details for Transfer Assistance: no cues or assistance needed with transfers Ambulation/Gait Ambulation/Gait Assistance: 5: Supervision Ambulation Distance (Feet): 30 Feet Assistive device: Rolling walker Ambulation/Gait Assistance Details: vc's for posture and gait sequence Gait Pattern: Step-to pattern;Antalgic    Exercises Total Joint Exercises Ankle Circles/Pumps: AROM;Both;10 reps;Supine Quad Sets: AROM;Strengthening;Right;10 reps;Supine Straight Leg Raises: AAROM;Strengthening;Right;10 reps;Supine    PT Goals Acute Rehab PT Goals PT Goal: Supine/Side to Sit - Progress: Met PT Transfer Goal: Bed to Chair/Chair to Bed - Progress: Met PT Goal: Ambulate -  Progress: Progressing toward goal  Visit Information  Last PT Received On: 03/01/12 Assistance Needed: +1    Subjective Data  Subjective: No new complaints, agreeable to therapy today.   Cognition  Cognition Overall Cognitive Status: Appears within functional limits for tasks assessed/performed Arousal/Alertness: Awake/alert Orientation Level: Appears intact for tasks assessed Behavior During Session: [facility] for tasks performed       End of Session PT - End of Session Equipment Utilized During Treatment: Gait belt Activity Tolerance: Patient tolerated treatment well Patient left: in chair;with call bell/phone within reach;with nursing in room Nurse Communication: Mobility status;Weight bearing status;Patient requests pain meds   GP     Sallyanne Kuster 03/01/2012, 12:51 PM  Sallyanne Kuster, PTA Office- [phone]

## 2012-03-01 NOTE — Progress Notes (Signed)
   CARE MANAGEMENT NOTE 03/01/2012  Patient:  Tiffany Yu, Tiffany Yu   Account Number:  [id number]  Date Initiated:  03/01/2012  Documentation initiated by:  [facility]  Subjective/Objective Assessment:   Fracture of femur, distal, right, closed     Action/Plan:   Anticipated DC Date:  03/01/2012   Anticipated DC Plan:  HOME W HOME HEALTH SERVICES      DC Planning Services  CM consult      [facility] Choice  HOME HEALTH   Choice offered to / List presented to:  C-1 Patient   DME arranged  CPM      DME agency  [employer]        [facility] agency  [facility]   Status of service:  Completed, signed off Medicare Important Message given?   (If response is "NO", the following Medicare IM given date fields will be blank) Date Medicare IM given:   Date Additional Medicare IM given:    Discharge Disposition:  HOME W HOME HEALTH SERVICES  Per UR Regulation:    If discussed at Long Length of Stay Meetings, dates discussed:    Comments:   03/01/2012  1500 NCM contacted [facility] for soc date 24-48 hours post dc. Pt is post op surgery. [employer] will deliver CPM this evening. Isidoro Donning RN CCM Case Mgmt phone [phone]   03/01/2012 2:49 PM  NCM spoke to pt and states she had [facility] in the past. Requesting [facility] for [facility]. Contact info added to dc instructions. States no DME is needed at home. Faxed referral to [facility] for [facility] for scheduled dc home today. Isidoro Donning RN CCM Case Mgmt phone [phone]

## 2012-03-01 NOTE — Discharge Summary (Signed)
Patient ID: Tiffany Yu MRN: [id number] DOB/AGE: 1955/01/30 57 y.o.  Admit date: 02/28/2012 Discharge date: 03/01/2012  Admission Diagnoses:  Principal Problem:   Fracture of femur, distal, right, closed   Discharge Diagnoses:  Same  Past Medical History  Diagnosis Date  . Hypertension   . Hypothyroidism   . Anxiety   . Depression   . Arthritis   . Refusal of blood transfusions as patient is Jehovah's Witness     Surgeries: Procedure(s): OPEN REDUCTION INTERNAL FIXATION (ORIF) RIGHT KNEE on 02/28/2012   Consultants:    Discharged Condition: Improved  Hospital Course: Tiffany Yu is an 57 y.o. female who was admitted 02/28/2012 for operative treatment ofFracture of femur, distal, right, closed. Patient has severe unremitting pain that affects sleep, daily activities, and work/hobbies. After pre-op clearance the patient was taken to the operating room on 02/28/2012 and underwent  Procedure(s): OPEN REDUCTION INTERNAL FIXATION (ORIF) RIGHT KNEE.    Patient was given perioperative antibiotics: Anti-infectives   Start     Dose/Rate Route Frequency Ordered Stop   02/29/12 0200  ceFAZolin (ANCEF) IVPB 2 g/50 mL premix     2 g 100 mL/hr over 30 Minutes Intravenous Every 6 hours 02/28/12 2352 02/29/12 1522       Patient was given sequential compression devices, early ambulation, and chemoprophylaxis to prevent DVT.  Patient benefited maximally from hospital stay and there were no complications.    Recent vital signs: Patient Vitals for the past 24 hrs:  BP Temp Pulse Resp SpO2  03/01/12 0600 102/51 mmHg 97.9 F (36.6 C) 76 18 100 %  02/29/12 2130 104/56 mmHg 98.1 F (36.7 C) 84 18 97 %  02/29/12 1430 100/44 mmHg 98.1 F (36.7 C) 86 18 100 %     Recent laboratory studies:  Recent Labs  02/28/12 0708 02/29/12 0510  WBC 11.4*  --   HGB 12.0  --   HCT 32.9*  --   PLT 240  --   NA 135 135  K 3.6 4.2  CL 97 101  CO2 28 28  BUN 14 9  CREATININE 1.10 0.96   GLUCOSE 98 130*  INR  --  1.17  CALCIUM 9.2 8.8     Discharge Medications:     Medication List    STOP taking these medications       HYDROmorphone 2 MG tablet  Commonly known as:  DILAUDID      TAKE these medications       escitalopram 10 MG tablet  Commonly known as:  LEXAPRO  Take 10 mg by mouth daily.     estradiol 1 MG tablet  Commonly known as:  ESTRACE  Take 1 mg by mouth daily.     levothyroxine 50 MCG tablet  Commonly known as:  SYNTHROID, LEVOTHROID  Take 50 mcg by mouth daily.     methocarbamol 500 MG tablet  Commonly known as:  ROBAXIN  Take 1 tablet (500 mg total) by mouth every 6 (six) hours as needed (spasm.).     nebivolol 5 MG tablet  Commonly known as:  BYSTOLIC  Take 5 mg by mouth daily.     oxyCODONE-acetaminophen 5-325 MG per tablet  Commonly known as:  PERCOCET/ROXICET  Take 1-2 tablets by mouth every 4 (four) hours as needed.     warfarin 5 MG tablet  Commonly known as:  COUMADIN  One daily unless otherwise directed. [facility] for INR of 1.5-2.0.  Treat x 1 month post op.  zolpidem 10 MG tablet  Commonly known as:  AMBIEN  Take 10 mg by mouth at bedtime as needed. For sleep.        Diagnostic Studies: Dg Chest 2 View  02/03/2012  *RADIOLOGY REPORT*  Clinical Data: Chest pain.  Near syncope.  Shortness of breath.  CHEST - 2 VIEW  Comparison: 12/13/2011  Findings: The heart size and pulmonary vascularity are normal. The lungs appear clear and expanded without focal air space disease or consolidation. No blunting of the costophrenic angles.  Scattered calcified granulomas in the lungs.  No pneumothorax.  Mediastinal contours appear intact.  Mild degenerative changes in the spine. No significant change since previous study.  IMPRESSION: No evidence of active pulmonary disease.   Original Report Authenticated By: Burman Nieves, M.D.    Dg Hip Complete Right  02/28/2012  *RADIOLOGY REPORT*  Clinical Data: Pain in the right hip and difficulty  straightening the leg after fall.  RIGHT HIP - COMPLETE 2+ VIEW  Comparison: None.  Findings: Degenerative changes in the right hip with narrowing of the joint space and superior acetabular sclerosis.  Hypertrophic changes on the femoral and acetabular sides.  No displaced fractures are demonstrated.  Degenerative changes in the left hip. The pelvis appears intact.  SI joints, symphysis pubis, and pelvic rim are not displaced.  IMPRESSION: Degenerative changes in the right hip.  No displaced fractures identified.   Original Report Authenticated By: Burman Nieves, M.D.    Dg Femur Right  02/28/2012  *RADIOLOGY REPORT*  Clinical Data: ORIF right knee  RIGHT FEMUR - 2 VIEW,DG C-ARM 61-120 MIN  Comparison: 02/28/2012 at 0553 hours  Findings: Intraoperative spot fluoroscopic images of the right knee are obtained for surgical control purposes.  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  There is interval placement of intramedullary rod with proximal and distal locking screws and anterior screw fixation across a transverse oblique fracture of the distal femoral metaphysis.  There is near anatomic alignment position of the fracture fragments postreduction. Hardware components appear well seated.  IMPRESSION: Intraoperative fluoroscopic images of the right knee are obtained for surgical control purposes, demonstrating internal reduction and intramedullary rod and screw fixation of a fracture of the distal right femoral metaphysis.   Original Report Authenticated By: Burman Nieves, M.D.    Ct Angio Chest W/cm &/or Wo Cm  02/03/2012  *RADIOLOGY REPORT*  Clinical Data: Central chest pain radiating to the back.  Knee replacement on 12/16/2011.  Swelling of the left knee.  CT ANGIOGRAPHY CHEST  Technique:  Multidetector CT imaging of the chest using the standard protocol during bolus administration of intravenous contrast. Multiplanar reconstructed images including MIPs were obtained and reviewed to  evaluate the vascular anatomy.  Contrast: OMNIPAQUE IOHEXOL 350 MG/ML SOLN  Comparison: 01/12/2008  Findings: Technically adequate study with moderately good opacification of the central and proximal segmental pulmonary arteries.  No central filling defects are demonstrated, suggesting no significant central pulmonary emboli.  The more peripheral pulmonary arteries are not well opacified and smaller peripheral emboli could be obscured.  Mild cardiac enlargement.  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  No evidence of dissection.  No significant lymphadenopathy in the chest.  The esophagus is decompressed.  Surgical absence of the gallbladder.  Visualized portions of the upper abdominal organs are otherwise unremarkable. No pleural effusions.  Patchy opacities in the lungs are likely due to respiratory motion artifact.  No definite evidence of focal consolidation, edema, or interstitial  disease.  Airways appear patent.  No pneumothorax.  Mild degenerative changes in the spine.  IMPRESSION: No evidence of significant pulmonary embolus in the visualized central pulmonary arteries.  Peripheral vessels are not well opacified for evaluation.   Original Report Authenticated By: Burman Nieves, M.D.    Dg Knee Complete 4 Views Right  02/28/2012  *RADIOLOGY REPORT*  Clinical Data: Right knee pain after fall.  Decreased range of motion.  RIGHT KNEE - COMPLETE 4+ VIEW  Comparison: None.  Findings: There is a right total knee arthroplasty with patellofemoral component.  There is an acute fracture of the right femoral metaphysis superior to the arthroplasty with posterior displacement and angulation with overriding of the distal fracture fragment.  There is a right knee effusion.  The tibial component of the arthroplasty appears well seated.  Patella is unremarkable.  IMPRESSION: Right distal femoral metaphyseal fracture at the proximal aspect of the arthroplasty component with posterior  displacement, overriding, and angulation of the distal fracture fragment.   Original Report Authenticated By: Burman Nieves, M.D.    Dg C-arm [phone] Min  02/28/2012  *RADIOLOGY REPORT*  Clinical Data: ORIF right knee  RIGHT FEMUR - 2 VIEW,DG C-ARM 61-120 MIN  Comparison: 02/28/2012 at 0553 hours  Findings: Intraoperative spot fluoroscopic images of the right knee are obtained for surgical control purposes.  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  There is interval placement of intramedullary rod with proximal and distal locking screws and anterior screw fixation across a transverse oblique fracture of the distal femoral metaphysis.  There is near anatomic alignment position of the fracture fragments postreduction. Hardware components appear well seated.  IMPRESSION: Intraoperative fluoroscopic images of the right knee are obtained for surgical control purposes, demonstrating internal reduction and intramedullary rod and screw fixation of a fracture of the distal right femoral metaphysis.   Original Report Authenticated By: Burman Nieves, M.D.     Disposition: 01-Home or Self Care      Discharge Orders   Future Orders Complete By Expires     CPM  As directed     Scheduling Instructions:      0-45 degrees . Use 6-8 hrs per day.    Call MD for:  redness, tenderness, or signs of infection (pain, swelling, redness, odor or green/yellow discharge around incision site)  As directed     Call MD for:  severe uncontrolled pain  As directed     Call MD for:  temperature >100.4  As directed     Change dressing (specify)  As directed     Comments:      Dressing change as needed.    Discharge instructions  As directed     Comments:      F/U with Dr. Luiz Blare POD #14.  Touch-down weightbearing with immobilizer.    Driving Restrictions  As directed     Comments:      No driving for 2 weeks.    Increase activity slowly  As directed     May shower / Bathe  As directed     Walker    As directed        Follow-up Information   Follow up with GRAVES,JOHN L, MD. Schedule an appointment as soon as possible for a visit in 2 weeks.   Contact information:   [address] [place] [address] [phone]        Signed: Hazle Nordmann. 03/01/2012, 8:25 AM

## 2012-03-02 ENCOUNTER — Encounter (HOSPITAL_COMMUNITY): Payer: Self-pay | Admitting: Orthopedic Surgery

## 2012-04-25 ENCOUNTER — Other Ambulatory Visit: Payer: Self-pay | Admitting: Obstetrics and Gynecology

## 2012-11-06 ENCOUNTER — Other Ambulatory Visit: Payer: Self-pay

## 2012-11-18 ENCOUNTER — Emergency Department (HOSPITAL_BASED_OUTPATIENT_CLINIC_OR_DEPARTMENT_OTHER)
Admission: EM | Admit: 2012-11-18 | Discharge: 2012-11-18 | Disposition: A | Discharge status: Home / Self Care | Payer: 59 | Attending: Emergency Medicine | Admitting: Emergency Medicine

## 2012-11-18 ENCOUNTER — Encounter (HOSPITAL_BASED_OUTPATIENT_CLINIC_OR_DEPARTMENT_OTHER): Payer: Self-pay | Admitting: Emergency Medicine

## 2012-11-18 DIAGNOSIS — F411 Generalized anxiety disorder: Secondary | ICD-10-CM | POA: Insufficient documentation

## 2012-11-18 DIAGNOSIS — M25519 Pain in unspecified shoulder: Secondary | ICD-10-CM | POA: Insufficient documentation

## 2012-11-18 DIAGNOSIS — I10 Essential (primary) hypertension: Secondary | ICD-10-CM | POA: Insufficient documentation

## 2012-11-18 DIAGNOSIS — M129 Arthropathy, unspecified: Secondary | ICD-10-CM | POA: Insufficient documentation

## 2012-11-18 DIAGNOSIS — E039 Hypothyroidism, unspecified: Secondary | ICD-10-CM | POA: Insufficient documentation

## 2012-11-18 DIAGNOSIS — F3289 Other specified depressive episodes: Secondary | ICD-10-CM | POA: Insufficient documentation

## 2012-11-18 DIAGNOSIS — M62838 Other muscle spasm: Secondary | ICD-10-CM | POA: Insufficient documentation

## 2012-11-18 DIAGNOSIS — F329 Major depressive disorder, single episode, unspecified: Secondary | ICD-10-CM | POA: Insufficient documentation

## 2012-11-18 DIAGNOSIS — Z79899 Other long term (current) drug therapy: Secondary | ICD-10-CM | POA: Insufficient documentation

## 2012-11-18 DIAGNOSIS — Z7901 Long term (current) use of anticoagulants: Secondary | ICD-10-CM | POA: Insufficient documentation

## 2012-11-18 DIAGNOSIS — G44209 Tension-type headache, unspecified, not intractable: Secondary | ICD-10-CM | POA: Insufficient documentation

## 2012-11-18 MED ORDER — SODIUM CHLORIDE 0.9 % IV BOLUS (SEPSIS)
1000.0000 mL | Freq: Once | INTRAVENOUS | Status: AC
  Administered 2012-11-18: 1000 mL via INTRAVENOUS

## 2012-11-18 MED ORDER — KETOROLAC TROMETHAMINE 30 MG/ML IJ SOLN
30.0000 mg | Freq: Once | INTRAMUSCULAR | Status: AC
  Administered 2012-11-18: 30 mg via INTRAVENOUS
  Filled 2012-11-18: qty 1

## 2012-11-18 MED ORDER — METOCLOPRAMIDE HCL 5 MG/ML IJ SOLN
10.0000 mg | Freq: Once | INTRAMUSCULAR | Status: AC
  Administered 2012-11-18: 10 mg via INTRAVENOUS
  Filled 2012-11-18: qty 2

## 2012-11-18 NOTE — ED Notes (Signed)
Pt amb to room 12 with quick steady gait in nad. Pt reports neck pain "I think I slept on it wrong..." pain radiates from neck to right shoulder and arm. Pt reports headache, "around my eyes" x 2 weeks also. Pt denies any change in sx today, states "I'm just tired of dealing with this!"

## 2012-11-18 NOTE — ED Provider Notes (Signed)
CSN: [id number]     Arrival date & time 11/18/12  8295 History   First MD Initiated Contact with Patient 11/18/12 1008     Chief Complaint  Patient presents with  . Shoulder Pain  . Headache   (Consider location/radiation/quality/duration/timing/severity/associated sxs/prior Treatment) Patient is a 57 y.o. female presenting with shoulder pain and headaches.  Shoulder Pain Associated symptoms include headaches.  Headache  Pt reports about 2-3 months of intermittent throbbing right sided headache, worse with bright lights. Worse in the last several days. She also reports R sided neck/shoulder soreness for 2 weeks, she attributes to sleeping on it wrong. Pain does not radiating down her arm. No numbness or weakness. No blurry vision, facial droop or slurred speech. She states symptoms have been worse since starting a new blood pressure medication a few months ago. Has discussed this with PCP who gave her some pain medications which have not helped much although she doesn't take them often.   Past Medical History  Diagnosis Date  . Hypertension   . Hypothyroidism   . Anxiety   . Depression   . Arthritis   . Refusal of blood transfusions as patient is Jehovah's Witness    Past Surgical History  Procedure Laterality Date  . Cholecystectomy    . Tendon release      right wrist  . Knee arthroscopy      Right  . Plantar fascia surgery      right foot  . Dilation and curettage of uterus    . Total knee arthroplasty  12/14/2011    right knee  . Knee arthroplasty  12/14/2011    Procedure: COMPUTER ASSISTED TOTAL KNEE ARTHROPLASTY;  Surgeon: Harvie Junior, MD;  Location: [facility] OR;  Service: Orthopedics;  Laterality: Right;  . Femur im nail Right 02/28/2012    Procedure: OPEN REDUCTION INTERNAL FIXATION (ORIF) RIGHT KNEE;  Surgeon: Harvie Junior, MD;  Location: [facility] OR;  Service: Orthopedics;  Laterality: Right;   History reviewed. No pertinent family history. History  Substance Use Topics   . Smoking status: Never Smoker   . Smokeless tobacco: Never Used  . Alcohol Use: Yes     Comment: very rarely   OB History   Grav Para Term Preterm Abortions TAB SAB Ect Mult Living                 Review of Systems  Neurological: Positive for headaches.   All other systems reviewed and are negative except as noted in HPI.   Allergies  Review of patient's allergies indicates no known allergies.  Home Medications   Current Outpatient Rx  Name  Route  Sig  Dispense  Refill  . ALPRAZolam (XANAX) 0.5 MG tablet   Oral   Take 0.5 mg by mouth at bedtime as needed for anxiety.         Marland Kitchen buPROPion (ZYBAN) 150 MG 12 hr tablet   Oral   Take 150 mg by mouth daily.         . cyclobenzaprine (FLEXERIL) 10 MG tablet   Oral   Take 10 mg by mouth 3 (three) times daily as needed for muscle spasms.         Marland Kitchen HYDROcodone-acetaminophen (NORCO/VICODIN) 5-325 MG per tablet   Oral   Take 1 tablet by mouth every 6 (six) hours as needed for moderate pain.         . hydrOXYzine (ATARAX/VISTARIL) 25 MG tablet   Oral   Take 25 mg  by mouth 3 (three) times daily as needed.         Marland Kitchen losartan (COZAAR) 25 MG tablet   Oral   Take 25 mg by mouth daily.         . temazepam (RESTORIL) 15 MG capsule   Oral   Take 15 mg by mouth at bedtime as needed for sleep.         Marland Kitchen escitalopram (LEXAPRO) 10 MG tablet   Oral   Take 10 mg by mouth daily.         Marland Kitchen estradiol (ESTRACE) 1 MG tablet   Oral   Take 1 mg by mouth daily.         Marland Kitchen levothyroxine (SYNTHROID, LEVOTHROID) 50 MCG tablet   Oral   Take 50 mcg by mouth daily.         . methocarbamol (ROBAXIN) 500 MG tablet   Oral   Take 1 tablet (500 mg total) by mouth every 6 (six) hours as needed (spasm.).   60 tablet   0   . nebivolol (BYSTOLIC) 5 MG tablet   Oral   Take 5 mg by mouth daily.         Marland Kitchen oxyCODONE-acetaminophen (PERCOCET/ROXICET) 5-325 MG per tablet   Oral   Take 1-2 tablets by mouth every 4 (four) hours  as needed.   60 tablet   0   . warfarin (COUMADIN) 5 MG tablet      One daily unless otherwise directed. [facility]oot for INR of 1.5-2.0.  Treat x 1 month post op.   30 tablet   0   . zolpidem (AMBIEN) 10 MG tablet   Oral   Take 10 mg by mouth at bedtime as needed. For sleep.          BP 163/91  Pulse 78  Temp(Src) 98.2 F (36.8 C) (Oral)  Resp 18  Ht 5\' 8"  (1.727 m)  Wt 208 lb (94.348 kg)  BMI 31.63 kg/m2  SpO2 99% Physical Exam  Nursing note and vitals reviewed. Constitutional: She is oriented to person, place, and time. She appears well-developed and well-nourished.  HENT:  Head: Normocephalic and atraumatic.  Eyes: EOM are normal. Pupils are equal, round, and reactive to light.  Neck: Normal range of motion. Neck supple.  Cardiovascular: Normal rate, normal heart sounds and intact distal pulses.   Pulmonary/Chest: Effort normal and breath sounds normal.  Abdominal: Bowel sounds are normal. She exhibits no distension. There is no tenderness.  Musculoskeletal: Normal range of motion. She exhibits tenderness (R cervical parespinal and trapezius muscle soreness). She exhibits no edema.  Neurological: She is alert and oriented to person, place, and time. She has normal strength. No cranial nerve deficit or sensory deficit.  Skin: Skin is warm and dry. No rash noted.  Psychiatric: She has a normal mood and affect.    ED Course  Procedures (including critical care time) Labs Review Labs Reviewed - No data to display Imaging Review No results found.  EKG Interpretation   None       MDM   1. Tension headache   2. Muscle spasm     Pt feeling much better after meds and ready to go home. Has hydrocodone and flexeril at home. Advised to take these as needed and follow up with PCP.     Charles B. Bernette Mayers, MD 11/18/12 1147

## 2012-11-18 NOTE — ED Notes (Signed)
Helped Pt ambulate  to the restroom

## 2012-12-09 ENCOUNTER — Ambulatory Visit (INDEPENDENT_AMBULATORY_CARE_PROVIDER_SITE_OTHER): Payer: PRIVATE HEALTH INSURANCE | Admitting: Licensed Clinical Social Worker

## 2012-12-09 DIAGNOSIS — F1920 Other psychoactive substance dependence, uncomplicated: Secondary | ICD-10-CM

## 2012-12-09 DIAGNOSIS — F331 Major depressive disorder, recurrent, moderate: Secondary | ICD-10-CM

## 2013-02-17 ENCOUNTER — Ambulatory Visit (HOSPITAL_COMMUNITY): Payer: Self-pay | Admitting: Psychiatry

## 2013-02-27 ENCOUNTER — Encounter: Payer: Self-pay | Admitting: Neurology

## 2013-03-02 ENCOUNTER — Institutional Professional Consult (permissible substitution): Payer: Self-pay | Admitting: Neurology

## 2013-03-30 ENCOUNTER — Institutional Professional Consult (permissible substitution): Payer: Self-pay | Admitting: Neurology

## 2013-04-16 ENCOUNTER — Ambulatory Visit (HOSPITAL_COMMUNITY): Payer: Self-pay | Admitting: Psychiatry

## 2013-04-23 ENCOUNTER — Ambulatory Visit (HOSPITAL_COMMUNITY): Payer: Self-pay | Admitting: Psychiatry

## 2013-05-06 ENCOUNTER — Ambulatory Visit (INDEPENDENT_AMBULATORY_CARE_PROVIDER_SITE_OTHER): Payer: 59 | Admitting: Neurology

## 2013-05-06 ENCOUNTER — Encounter: Payer: Self-pay | Admitting: Neurology

## 2013-05-06 VITALS — BP 122/90 | HR 84 | Resp 18 | Ht 69.0 in | Wt 232.0 lb

## 2013-05-06 DIAGNOSIS — G4730 Sleep apnea, unspecified: Secondary | ICD-10-CM

## 2013-05-06 DIAGNOSIS — R0683 Snoring: Secondary | ICD-10-CM | POA: Insufficient documentation

## 2013-05-06 DIAGNOSIS — J309 Allergic rhinitis, unspecified: Secondary | ICD-10-CM

## 2013-05-06 DIAGNOSIS — R0609 Other forms of dyspnea: Secondary | ICD-10-CM

## 2013-05-06 DIAGNOSIS — R0989 Other specified symptoms and signs involving the circulatory and respiratory systems: Secondary | ICD-10-CM

## 2013-05-06 DIAGNOSIS — G4710 Hypersomnia, unspecified: Secondary | ICD-10-CM

## 2013-05-06 NOTE — Progress Notes (Signed)
[facility]  Provider:  Larey Seat, M D  Referring Provider: Phineas Inches, MD Primary Care Physician:  Phineas Inches, MD  Chief Complaint  Patient presents with  . New Evaluation    Room 10  . Sleep consult    HPI:  Tiffany Yu is a 58 y.o.  afro american right handed , married  female . She is seen here as a referral  from Dr. Coletta Memos and Eldridge Abrahams , NP for a sleep consultation. Tiffany Yu reported he is tired and sleepy throughout the day but she does not feel that she can fall asleep at all the mean problem seems to be a high level of fatigue. She also gained some significant weight over the last couple of years, about 35 pounds in 2 years . In addition, her  chronic rhinitis forces her breathing through her mouth. She has been witnessed to snort loudly at night in her husband's observations have cost her to seek another opinion in regards to possible sleep apnea being present. Her husband seems to r think she has pauses in her breathing.  She also suffers from insomnia. She states and she has been using Xanax at times but didn't initiate sleep after taking Ambien for along time . The degree of fatigue impacts her social activities. She also acknowledges a level of depression and marital stressors, some anxieties.  Recently , she had a long meeting about wellness and dietary changes and to implement some exercise, currently she is not exercising. 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  Her bedtime is on average at 11 Pm , sometimes it's later . She needs now a sleep aid daily  to fall asleep - xanax works within 15 minutes , she still wakes up 2-3 times each night, having trouble to go back to sleep. She just wakes up- no nocturia, sometimes with headaches not from headaches. Her husband of 18 years left the bedroom, being bothered by her snoring. She reports  vivid dreams, not nightmares, waking her up 2-3 times at night. She has been acting out, fighting  and talking in her sleep. Her husband confirmed this , being kicked. She has been sleep eating under Ambien.  Her bedroom is cool, quiet and dark, she watches no TV in the bedroom. She wakes up at 7 AM and often she is already awake by 5 AM. She only has an alarm as a back up.   She tosses and turns when laying awake in early morning. Never feeling refreshed- she cannot recall when she last felt restored and ready to go.  She takes breakfast at home, cereal, and drinks no coffee but Soda in AM. She drives  12 miles to work and feels tired. She has a window at her workplace but not close to her desk . Her work is computer bound and she feels its exhausting without physically challenging her . She drifts off - daydreaming. She feels it helps to leave at lunchtime and walk outside , but since last years fracture and fall she has been not able to walk.   She was not a sleep walker and has no history of night terrors.   Family history of snoring in all living generations.   Review of Systems: Out of a complete 14 system review, the patient complains of only the following symptoms, and all other  reviewed systems are negative.  Tiffany Yu endorsed the Epworth Sleepiness Scale at 3 points, but the FSS at 50  Points. She endorsed snoring, fatigue and restless legs,  depression and desinterest.   History   Social History  . Marital Status: Married    Spouse Name: Ebony Hail    Number of Children: 2  . Years of Education: College   Occupational History  . CUSTOMER SERVICE REP  [employer]   Social History Main Topics  . Smoking status: Never Smoker   . Smokeless tobacco: Never Used  . Alcohol Use: Yes     Comment: very rarely  . Drug Use: No  . Sexual Activity:    Other Topics Concern  . Not on file   Social History Narrative   Patient is married Ebony Hail) and lives at home with her husband.    Patient has a college education.   Patient drinks two sodas per day.   Patient has two children.   Patient is right-handed.          Family History  Problem Relation Age of Onset  . Anemia Mother   . Hypertension Mother   . Heart attack Father     Past Medical History  Diagnosis Date  . Hypertension   . Hypothyroidism   . Anxiety   . Depression   . Arthritis   . Refusal of blood transfusions as patient is Jehovah's Witness   . Unspecified vitamin D deficiency   . Snoring 05/06/2013  . Chronic allergic rhinitis 05/06/2013    Past Surgical History  Procedure Laterality Date  . Cholecystectomy    . Tendon release      right wrist  . Knee arthroscopy      Right  . Plantar fascia surgery      right foot  . Dilation and curettage of uterus    . Total knee arthroplasty  12/14/2011    right knee  . Knee arthroplasty  12/14/2011    Procedure: COMPUTER ASSISTED TOTAL KNEE ARTHROPLASTY;  Surgeon: Alta Corning, MD;  Location: [place];  Service: Orthopedics;  Laterality: Right;  . Femur im nail Right 02/28/2012    Procedure: OPEN REDUCTION INTERNAL FIXATION (ORIF) RIGHT KNEE;  Surgeon: Alta Corning, MD;  Location: [place];  Service: Orthopedics;  Laterality: Right;    Current Outpatient Prescriptions  Medication Sig Dispense Refill  . ALPRAZolam (XANAX) 0.5 MG tablet Take 0.5 mg by mouth at bedtime as needed for anxiety.      Marland Kitchen amitriptyline (ELAVIL) 100 MG tablet Take 100 mg by mouth at bedtime.      Marland Kitchen buPROPion (ZYBAN) 150 MG 12 hr tablet Take 150 mg by mouth daily.      . cyclobenzaprine (FLEXERIL) 10 MG tablet Take 10 mg by mouth 3 (three) times daily as needed for muscle spasms.      Marland Kitchen escitalopram (LEXAPRO) 10 MG tablet Take 10 mg by mouth daily.      Marland Kitchen estradiol (ESTRACE) 1 MG tablet Take 1 mg by mouth daily.      . hydrOXYzine (ATARAX/VISTARIL) 25 MG tablet Take 25 mg by mouth 3 (three) times daily as needed.      Marland Kitchen levothyroxine (SYNTHROID, LEVOTHROID) 50 MCG tablet Take  50 mcg by mouth daily.      Marland Kitchen losartan (COZAAR) 25 MG tablet Take 25 mg by mouth daily.      . meclizine (ANTIVERT) 25 MG tablet Take 25 mg by mouth 3 (three) times  daily as needed for dizziness.      . nebivolol (BYSTOLIC) 5 MG tablet Take 5 mg by mouth daily.      . sertraline (ZOLOFT) 50 MG tablet Take 50 mg by mouth daily.      . temazepam (RESTORIL) 15 MG capsule Take 15 mg by mouth at bedtime as needed for sleep.      Marland Kitchen zolpidem (AMBIEN) 10 MG tablet Take 10 mg by mouth at bedtime as needed. For sleep.       No current facility-administered medications for this visit.    Allergies as of 05/06/2013  . (No Known Allergies)    Vitals: BP 122/90  Pulse 84  Resp 18  Ht 5\' 9"  (1.753 m)  Wt 232 lb (105.235 kg)  BMI 34.24 kg/m2 Last Weight:  Wt Readings from Last 1 Encounters:  05/06/13 232 lb (105.235 kg)   Last Height:   Ht Readings from Last 1 Encounters:  05/06/13 5\' 9"  (1.753 m)    Physical exam:  General: The patient is awake, alert and appears not in acute distress. The patient is well groomed. Head: Normocephalic, atraumatic. Neck is supple. Mallampati 4 , large tongue - the uvula is not visible. , neck circumference: 16.5, full set of dentures , no TMJ but prominent jaw.  very flat bridge of the nose and nasal speech, restricted nasal airflow.  Cardiovascular:  Regular rate and rhythm  without  murmurs or carotid bruit, and without distended neck veins. Respiratory: Lungs are clear to auscultation. Skin:  Without evidence of edema, or rash Trunk: BMI is elevated and patient  has normal posture.  Neurologic exam : The patient is awake and alert, oriented to place and time.  Memory subjective  described as intact.  There is a normal attention span & concentration ability. Speech is fluent without  dysarthria, dysphonia or aphasia. Mood and affect are depressed .  Cranial nerves: Pupils are equal and briskly reactive to light. Funduscopic exam without  evidence of  pallor or edema. Extraocular movements  in vertical and horizontal planes intact and without nystagmus. Visual fields by finger perimetry are intact. Hearing to finger rub intact.  Facial sensation intact to fine touch. Facial motor strength is symmetric and tongue and uvula move midline.  Motor exam:  Normal tone and normal muscle bulk and symmetric normal strength in all extremities.  Sensory:  Fine touch, pinprick and vibration were tested in all extremities. Proprioception is  normal.  Coordination: Rapid alternating movements in the fingers/hands is tested and normal. Finger-to-nose maneuver  without evidence of ataxia, dysmetria or tremor.  Gait and station: Patient walks without assistive device .  Deep tendon reflexes: in the  upper and lower extremities are symmetric and intact. Babinski maneuver response is  Downgoing.     Assessment:  After physical and neurologic examination, review of laboratory studies, imaging, neurophysiology testing and pre-existing records, assessment is  1) High risk for OSA based on BMI , very narrow upper airway and nasal  Airflow restriction. I recommend to use a nasal spray at night ,  Her dental implants are not dentures in the classic sense, she has no history of bruxism. Daily Claritin, nightly benadryl.  Her sleep eating, talking and acting out was noted while under the influence of Ambien.   Plan:  Treatment plan and additional workup : Split study ,  4% score and split at AHI 20.[employer]. Co2 .

## 2013-05-06 NOTE — Patient Instructions (Signed)
Polysomnography (Sleep Studies) Polysomnography (PSG) is a series of tests used for detecting (diagnosing) obstructive sleep apnea and other sleep disorders. The tests measure how some parts of your body are working while you are sleeping. The tests are extensive and expensive. They are done in a sleep lab or hospital, and vary from center to center. Your caregiver may perform other more simple sleep studies and questionnaires before doing more complete and involved testing. Testing may not be covered by insurance. Some of these tests are:  An EEG (Electroencephalogram). This tests your brain waves and stages of sleep.  An EOG (Electrooculogram). This measures the movements of your eyes. It detects periods of REM (rapid eye movement) sleep, which is your dream sleep.  An EKG (Electrocardiogram). This measures your heart rhythm.  EMG (Electromyography). This is a measurement of how the muscles are working in your upper airway and your legs while sleeping.  An oximetry measurement. It measures how much oxygen (air) you are getting while sleeping.  Breathing efforts may be measured. The same test can be interpreted (understood) differently by different caregivers and centers that study sleep.  Studies may be given an apnea/hypopnea index (AHI). This is a number which is found by counting the times of no breathing or under breathing during the night, and relating those numbers to the amount of time spent in bed. When the AHI is greater than 15, the patient is likely to complain of daytime sleepiness. When the AHI is greater than 30, the patient is at increased risk for heart problems and must be followed more closely. Following the AHI also allows you to know how treatment is working. Simple oximetry (tracking the amount of oxygen that is taken in) can be used for screening patients who:  Do not have symptoms (problems) of OSA.  Have a normal Epworth Sleepiness Scale Score.  Have a low pre-test  probability of having OSA.  Have none of the upper airway problems likely to cause apnea.  Oximetry is also used to determine if treatment is effective in patients who showed significant desaturations (not getting enough oxygen) on their home sleep study. One extra measure of safety is to perform additional studies for the person who only snores. This is because no one can predict with absolute certainty who will have OSA. Those who show significant desaturations (not getting enough oxygen) are recommended to have a more detailed sleep study. Document Released: 06/24/2002 Document Revised: 03/12/2011 Document Reviewed: 12/18/2004 [employer] Patient Information 2014 [employer].  

## 2013-06-12 ENCOUNTER — Ambulatory Visit: Payer: 59 | Admitting: Neurology

## 2013-06-12 DIAGNOSIS — G4733 Obstructive sleep apnea (adult) (pediatric): Secondary | ICD-10-CM

## 2013-06-25 ENCOUNTER — Other Ambulatory Visit: Payer: Self-pay | Admitting: *Deleted

## 2013-06-25 ENCOUNTER — Telehealth: Payer: Self-pay | Admitting: Neurology

## 2013-06-25 DIAGNOSIS — J309 Allergic rhinitis, unspecified: Secondary | ICD-10-CM

## 2013-06-25 DIAGNOSIS — G4730 Sleep apnea, unspecified: Secondary | ICD-10-CM

## 2013-06-25 DIAGNOSIS — G4710 Hypersomnia, unspecified: Secondary | ICD-10-CM

## 2013-06-25 DIAGNOSIS — G4733 Obstructive sleep apnea (adult) (pediatric): Secondary | ICD-10-CM

## 2013-06-25 DIAGNOSIS — R0683 Snoring: Secondary | ICD-10-CM

## 2013-06-25 NOTE — Telephone Encounter (Signed)
I called and left a message for the patient about her recent sleep study results. I informed the patient that the study revealed the diagnosis of obstructive sleep apnea and that Dr. Brett Fairy recommend treatment in the form of CPAP. This will require repeat sleep study for the proper titration and mask fitting. I will send a copy of the report to Eldridge Abrahams, NP office and mail a copy to the patient.

## 2013-06-26 ENCOUNTER — Encounter: Payer: Self-pay | Admitting: *Deleted

## 2013-07-01 ENCOUNTER — Other Ambulatory Visit: Payer: Self-pay | Admitting: Obstetrics and Gynecology

## 2013-07-02 LAB — CYTOLOGY - PAP

## 2013-09-30 ENCOUNTER — Ambulatory Visit (INDEPENDENT_AMBULATORY_CARE_PROVIDER_SITE_OTHER): Payer: 59 | Admitting: Neurology

## 2013-09-30 VITALS — BP 144/82

## 2013-09-30 DIAGNOSIS — G4761 Periodic limb movement disorder: Secondary | ICD-10-CM

## 2013-09-30 DIAGNOSIS — G4733 Obstructive sleep apnea (adult) (pediatric): Secondary | ICD-10-CM

## 2013-10-07 ENCOUNTER — Other Ambulatory Visit: Payer: Self-pay | Admitting: Obstetrics and Gynecology

## 2013-10-12 ENCOUNTER — Encounter: Payer: Self-pay | Admitting: *Deleted

## 2013-10-12 ENCOUNTER — Other Ambulatory Visit: Payer: Self-pay | Admitting: Neurology

## 2013-10-12 ENCOUNTER — Telehealth: Payer: Self-pay | Admitting: *Deleted

## 2013-10-12 DIAGNOSIS — G4733 Obstructive sleep apnea (adult) (pediatric): Secondary | ICD-10-CM

## 2013-10-12 NOTE — Telephone Encounter (Signed)
Patient returned my phone call and was provided the results to her most recent CPAP titration study.  Patient informed that CPAP was effective in treating her sleep apnea and that Dr. Brett Fairy had prescribed CPAP therapy for home use.  Patient was in agreement and the patient was referred to [facility].  Patient was mailed a copy of her report and a copy was faxed to Dr. Bernerd Limbo.  Patient informed to call and schedule follow up visit 6-8 weeks after start of CPAP therapy.

## 2013-11-17 ENCOUNTER — Encounter: Payer: Self-pay | Admitting: Neurology

## 2014-01-14 ENCOUNTER — Ambulatory Visit (INDEPENDENT_AMBULATORY_CARE_PROVIDER_SITE_OTHER): Payer: 59 | Admitting: Neurology

## 2014-01-14 ENCOUNTER — Encounter: Payer: Self-pay | Admitting: Neurology

## 2014-01-14 VITALS — BP 131/83 | HR 104 | Resp 20 | Ht 69.0 in | Wt 245.5 lb

## 2014-01-14 DIAGNOSIS — G4733 Obstructive sleep apnea (adult) (pediatric): Secondary | ICD-10-CM | POA: Insufficient documentation

## 2014-01-14 DIAGNOSIS — F409 Phobic anxiety disorder, unspecified: Secondary | ICD-10-CM

## 2014-01-14 DIAGNOSIS — Z9989 Dependence on other enabling machines and devices: Secondary | ICD-10-CM

## 2014-01-14 DIAGNOSIS — F5105 Insomnia due to other mental disorder: Secondary | ICD-10-CM

## 2014-01-14 MED ORDER — SUVOREXANT 10 MG PO TABS: 10.0000 mg | ORAL_TABLET | Freq: Every evening | ORAL | Status: DC

## 2014-01-14 NOTE — Progress Notes (Signed)
[facility]  Provider:  Larey Yu, M D  Referring Provider: Phineas Inches, MD Primary Care Physician:  Tiffany Inches, MD  Chief Complaint  Patient presents with  . RV sleep cpap    Rm 10, alone    HPI:  Tiffany Yu is a 59 y.o.  afro american right handed , married  female . She is seen here as a referral  from Dr. Coletta Yu and Tiffany Yu , NP for a sleep consultation. Tiffany Yu reported he is tired and sleepy throughout the day but she does not feel that she can fall asleep at all the mean problem seems to be a high level of fatigue. She also gained some significant weight over the last couple of years, about 35 pounds in 2 years . In addition, her  chronic rhinitis forces her breathing through her mouth. She has been witnessed to snort loudly at night in her husband's observations have cost her to seek another opinion in regards to possible sleep apnea being present. Her husband seems to r think she has pauses in her breathing.  She also suffers from insomnia. She states and she has been using Xanax at times but didn't initiate sleep after taking Ambien for along time . The degree of fatigue impacts her social activities. She also acknowledges a level of depression and marital stressors, some anxieties.  Recently , she had a long meeting about wellness and dietary changes and to implement some exercise, currently she is not exercising. 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  Her bed time is on average at 11 PM , sometimes it's later. She needs now a sleep aid daily to fall asleep - xanax works within 15 minutes , she still wakes up 2-3 times each night, having trouble to go back to sleep. She just wakes up- no nocturia, sometimes with headaches not from headaches. Her husband of 18 years left the bedroom, being bothered by her snoring. She reports vivid dreams, not  nightmares, waking her up 2-3 times at night. She has been acting out, fighting  and talking in her sleep. Her husband confirmed this , being kicked. She has been sleep eating under Ambien.  Her bedroom is cool, quiet and dark, she watches no TV in the bedroom. She wakes up at 7 AM and often she is already awake by 5 AM. She only has an alarm as a back up.   She tosses and turns when laying awake in early morning. Never feeling refreshed- she cannot recall when she last felt restored and ready to go.  She takes breakfast at home,and drinks not coffee but Soda in AM. She drives 12 miles to work and feels tired. She has a window at her workplace but not close to her desk .  Her work is computer bound and she feels its exhausting without physically challenging her . She drifts off - daydreaming. She feels it helps to leave at lunchtime and walk outside , but since last years fracture and fall she has been not able to walk. She was not a sleep walker and has no history of night terrors.    Family history of snoring in all living generations.   01-15-14 Interval history the patient reports that with her New Year's resolutions she has not taken caffeine for while she is trying to reduce the intake of  sweets and carbs and is trying to implement a exercise regimen. She had further gained weight since her last visit. Since the she had been diagnosed with obstructive sleep apnea she has been using CPAP and I was able to interrogate her machine today. A download for 30 days dated 01-13-14 shows 100% compliance she has used the machine every day of 30 days but only 23 days longer than 4 hours. However are average daily usage is still 4 hours and 58 minutes the set pressure is 7 cm water was 1 cm EPR her residual AHI is 4.8.  The patient has been using a nasal mask or nasal pillow for the last 3 months and she states that she feels the head gear has been not as height as it needs to be. Her Epworth sleepiness score was  endorsed at 8 points and her fatigue severity score at 36 points which is borderline. Her sleep habits have not changed since she started on CPAP she still goes to bed between 10 and 11 PM. She has to arise at the same time she used to last year. Her overnight sleep time is about 5-6 hours. She reports fragmented sleep and spontaneously waking up is unsure why. It is not bathroom breaks that wake her. Her husband stated she kicks and talks in her sleep. She takes Ambien every night and still wakes up.   Review of Systems: Out of a complete 14 system review, the patient complains of only the following symptoms, and all other reviewed systems are negative.  In 2015 this patient endorsed the Epworth Sleepiness Scale at 3 points, but the FSS at 50  Points. She endorsed snoring, fatigue and restless legs,  depression and desinterest.  This year Epworth 8 and FSS 36,   History   Social History  . Marital Status: Married    Spouse Name: Tiffany Yu    Number of Children: 2  . Years of Education: College   Occupational History  . CUSTOMER SERVICE REP  [employer]   Social History Main Topics  . Smoking status: Never Smoker   . Smokeless tobacco: Never Used  . Alcohol Use: Yes     Comment: very rarely  . Drug Use: No  . Sexual Activity: Not on file   Other Topics Concern  . Not on file   Social History Narrative   Patient is married Tiffany Yu) and lives at home with her husband.   Patient has a college education.   Patient drinks two sodas per day.   Patient has two children.   Patient is right-handed.          Family History  Problem Relation Age of Onset  . Anemia Mother   . Hypertension Mother   . Heart attack Father     Past Medical History  Diagnosis Date  . Hypertension   . Hypothyroidism   . Anxiety   . Depression   . Arthritis   . Refusal of blood transfusions as patient is Jehovah's Witness   . Unspecified vitamin D deficiency   . Snoring 05/06/2013  . Chronic allergic  rhinitis 05/06/2013    Past Surgical History  Procedure Laterality Date  . Cholecystectomy    . Tendon release      right wrist  . Knee arthroscopy      Right  . Plantar fascia surgery      right foot  . Dilation and curettage of uterus    . Total knee arthroplasty  12/14/2011  right knee  . Knee arthroplasty  12/14/2011    Procedure: COMPUTER ASSISTED TOTAL KNEE ARTHROPLASTY;  Surgeon: Alta Corning, MD;  Location: [facility];  Service: Orthopedics;  Laterality: Right;  . Femur im nail Right 02/28/2012    Procedure: OPEN REDUCTION INTERNAL FIXATION (ORIF) RIGHT KNEE;  Surgeon: Alta Corning, MD;  Location: [facility];  Service: Orthopedics;  Laterality: Right;    Current Outpatient Prescriptions  Medication Sig Dispense Refill  . ALPRAZolam (XANAX) 0.5 MG tablet Take 0.5 mg by mouth at bedtime as needed for anxiety.    Marland Kitchen amitriptyline (ELAVIL) 50 MG tablet     . buPROPion (ZYBAN) 150 MG 12 hr tablet Take 150 mg by mouth daily.    . DULoxetine (CYMBALTA) 30 MG capsule Take 30 mg by mouth.    . hydrochlorothiazide (MICROZIDE) 12.5 MG capsule Take 12.5 mg by mouth.    . levothyroxine (SYNTHROID, LEVOTHROID) 75 MCG tablet Take by mouth.    . losartan (COZAAR) 25 MG tablet Take 25 mg by mouth daily.    . Vitamin D, Ergocalciferol, (DRISDOL) 50000 UNITS CAPS capsule TAKE ONE CAPSULE BY MOUTH ONCE A WEEK    . zolpidem (AMBIEN) 10 MG tablet Take 10 mg by mouth at bedtime as needed. For sleep.    Marland Kitchen JINTELI 1-5 MG-MCG TABS See admin instructions.  4   No current facility-administered medications for this visit.    Allergies as of 01/14/2014  . (No Known Allergies)    Vitals: BP 131/83 mmHg  Pulse 104  Resp 20  Ht 5\' 9"  (1.753 m)  Wt 245 lb 8 oz (111.358 kg)  BMI 36.24 kg/m2 Last Weight:  Wt Readings from Last 1 Encounters:  01/14/14 245 lb 8 oz (111.358 kg)   Last Height:   Ht Readings from Last 1 Encounters:  01/14/14 5\' 9"  (1.753 m)    Physical exam:  General: The patient is  awake, alert and appears not in acute distress. The patient is well groomed. Head: Normocephalic, atraumatic. Neck is supple. Mallampati 4 , large tongue - the uvula is not visible.,  neck circumference: 16.5, full set of dentures , no TMJ but prominent jaw.  very flat bridge of the nose and nasal speech, restricted nasal airflow.  Cardiovascular:  Regular rate and rhythm without carotid bruit, and without distended neck veins. Respiratory: Lungs are clear .Marland Kitchen Skin:  Without evidence of edema, or rash. Trunk: BMI is elevated .  Neurologic exam : The patient is awake and alert, oriented to place and time.  Memory subjective  described as intact.  There is a normal attention span & concentration ability. Speech is fluent without  dysarthria, dysphonia or aphasia. Mood and affect are depressed .  Cranial nerves: Pupils are equal and briskly reactive to light. Funduscopic exam without  evidence of pallor or edema.  Extraocular movements  in vertical and horizontal planes intact and without nystagmus. Visual fields by finger perimetry are intact. Hearing to finger rub intact.  Facial sensation intact to fine touch.  Facial motor strength is symmetric and tongue and uvula move midline. Shoulder shrug is equal. tongue protrusion is equal.   Motor exam:  Normal tone and normal muscle bulk and symmetricstrength in all extremities.  She had a knee replacement in the right leg, she has pain when its cold- pain is localized in front and at the side above the knee cap.  Sensory:  Fine touch, pinprick and vibration  Normal. Right hand tingles, sometimes "locks up".  Tingling affects the thumb, index and part of the middle finger .   Coordination: Finger-to-nose maneuver without evidence of ataxia, dysmetria or tremor.  Gait and station: Patient walks without assistive device . Stand is wider based.   Deep tendon reflexes: in the  upper and lower extremities are symmetric and intact. Babinski maneuver  response is down..     Assessment:  After physical and neurologic examination, review of laboratory studies, imaging, neurophysiology testing and pre-existing records, assessment is  1)  OSA  Controlled on CPAP. Review sleep study for details.  Risk based on retrognathia and based on BMI , very narrow upper airway and nasal  Airflow restriction.  I recommend to use a nasal spray at night ,  Her dental implants are not dentures in the classic sense, she has no history of bruxism. Daily Claritin, nightly benadryl.  2) Her sleep eating, talking and acting out was noted while under the influence of Ambien. I will offer Belsomra .  Plan:  Treatment plan and additional workup : Rv in 6 month  with NP.   Review sleep diary, implement insomnia rules. Keep bedroom cool , quiet and dark.  belsomra 10 mg  as samples given , documented in medication list.

## 2014-01-14 NOTE — Addendum Note (Signed)
Addended by: Larey Seat on: 01/14/2014 03:38 PM   Modules accepted: Orders

## 2014-02-11 ENCOUNTER — Emergency Department (HOSPITAL_BASED_OUTPATIENT_CLINIC_OR_DEPARTMENT_OTHER)
Admission: EM | Admit: 2014-02-11 | Discharge: 2014-02-11 | Disposition: A | Discharge status: Home / Self Care | Payer: 59 | Attending: Emergency Medicine | Admitting: Emergency Medicine

## 2014-02-11 ENCOUNTER — Encounter (HOSPITAL_BASED_OUTPATIENT_CLINIC_OR_DEPARTMENT_OTHER): Payer: Self-pay | Admitting: *Deleted

## 2014-02-11 ENCOUNTER — Emergency Department (HOSPITAL_BASED_OUTPATIENT_CLINIC_OR_DEPARTMENT_OTHER): Payer: 59

## 2014-02-11 DIAGNOSIS — E039 Hypothyroidism, unspecified: Secondary | ICD-10-CM | POA: Insufficient documentation

## 2014-02-11 DIAGNOSIS — F329 Major depressive disorder, single episode, unspecified: Secondary | ICD-10-CM | POA: Insufficient documentation

## 2014-02-11 DIAGNOSIS — F419 Anxiety disorder, unspecified: Secondary | ICD-10-CM | POA: Insufficient documentation

## 2014-02-11 DIAGNOSIS — E559 Vitamin D deficiency, unspecified: Secondary | ICD-10-CM | POA: Diagnosis not present

## 2014-02-11 DIAGNOSIS — M25511 Pain in right shoulder: Secondary | ICD-10-CM | POA: Insufficient documentation

## 2014-02-11 DIAGNOSIS — M542 Cervicalgia: Secondary | ICD-10-CM | POA: Insufficient documentation

## 2014-02-11 DIAGNOSIS — Z79899 Other long term (current) drug therapy: Secondary | ICD-10-CM | POA: Insufficient documentation

## 2014-02-11 DIAGNOSIS — R000 Tachycardia, unspecified: Secondary | ICD-10-CM | POA: Diagnosis not present

## 2014-02-11 DIAGNOSIS — I10 Essential (primary) hypertension: Secondary | ICD-10-CM | POA: Insufficient documentation

## 2014-02-11 DIAGNOSIS — R0602 Shortness of breath: Secondary | ICD-10-CM | POA: Diagnosis not present

## 2014-02-11 DIAGNOSIS — R002 Palpitations: Secondary | ICD-10-CM

## 2014-02-11 LAB — CBC WITH DIFFERENTIAL/PLATELET
BASOS ABS: 0 10*3/uL (ref 0.0–0.1)
BASOS PCT: 1 % (ref 0–1)
EOS ABS: 0.3 10*3/uL (ref 0.0–0.7)
Eosinophils Relative: 4 % (ref 0–5)
HEMATOCRIT: 35.7 % — AB (ref 36.0–46.0)
HEMOGLOBIN: 12.7 g/dL (ref 12.0–15.0)
Lymphocytes Relative: 30 % (ref 12–46)
Lymphs Abs: 2.4 10*3/uL (ref 0.7–4.0)
MCH: 28.4 pg (ref 26.0–34.0)
MCHC: 35.6 g/dL (ref 30.0–36.0)
MCV: 79.9 fL (ref 78.0–100.0)
Monocytes Absolute: 0.6 10*3/uL (ref 0.1–1.0)
Monocytes Relative: 8 % (ref 3–12)
NEUTROS PCT: 57 % (ref 43–77)
Neutro Abs: 4.7 10*3/uL (ref 1.7–7.7)
Platelets: 293 10*3/uL (ref 150–400)
RBC: 4.47 MIL/uL (ref 3.87–5.11)
RDW: 15 % (ref 11.5–15.5)
WBC: 8.1 10*3/uL (ref 4.0–10.5)

## 2014-02-11 LAB — TROPONIN I: Troponin I: <0.03 ng/mL (ref <0.031)

## 2014-02-11 LAB — COMPREHENSIVE METABOLIC PANEL
ALK PHOS: 105 U/L (ref 39–117)
ALT: 13 U/L (ref 0–35)
AST: 24 U/L (ref 0–37)
Albumin: 3.9 g/dL (ref 3.5–5.2)
Anion gap: 7 (ref 5–15)
BUN: 20 mg/dL (ref 6–23)
CO2: 24 mmol/L (ref 19–32)
Calcium: 8.9 mg/dL (ref 8.4–10.5)
Chloride: 106 mmol/L (ref 96–112)
Creatinine, Ser: 1.43 mg/dL — ABNORMAL HIGH (ref 0.50–1.10)
GFR, EST AFRICAN AMERICAN: 45 mL/min — AB (ref >90)
GFR, EST NON AFRICAN AMERICAN: 39 mL/min — AB (ref >90)
Glucose, Bld: 99 mg/dL (ref 70–99)
POTASSIUM: 3.6 mmol/L (ref 3.5–5.1)
SODIUM: 137 mmol/L (ref 135–145)
Total Bilirubin: 0.4 mg/dL (ref 0.3–1.2)
Total Protein: 8.2 g/dL (ref 6.0–8.3)

## 2014-02-11 LAB — D-DIMER, QUANTITATIVE: D-Dimer, Quant: 0.89 ug/mL-FEU — ABNORMAL HIGH (ref 0.00–0.48)

## 2014-02-11 MED ORDER — SODIUM CHLORIDE 0.9 % IV BOLUS (SEPSIS)
500.0000 mL | Freq: Once | INTRAVENOUS | Status: AC
  Administered 2014-02-11: 500 mL via INTRAVENOUS

## 2014-02-11 MED ORDER — IOHEXOL 350 MG/ML SOLN
80.0000 mL | Freq: Once | INTRAVENOUS | Status: AC | PRN
  Administered 2014-02-11: 80 mL via INTRAVENOUS

## 2014-02-11 NOTE — ED Notes (Signed)
Pt placed on cardiac monitor 

## 2014-02-11 NOTE — ED Notes (Signed)
Tachycardia since January. Her MD did an EKG and started her on Losartan and plans to refer her to a cardiologist. Today she just didn't have energy, right shoulder pain and sob.

## 2014-02-11 NOTE — ED Provider Notes (Signed)
CSN: [id number]     Arrival date & time 02/11/14  1106 History   First MD Initiated Contact with Patient 02/11/14 1134     Chief Complaint  Patient presents with  . Tachycardia     (Consider location/radiation/quality/duration/timing/severity/associated sxs/prior Treatment) Patient is a 59 y.o. female presenting with shortness of breath. The history is provided by the patient.  Shortness of Breath Severity:  Mild Onset quality:  Gradual Duration:  2 days Timing:  Constant Progression:  Unchanged Chronicity:  New Context comment:  At rest Relieved by:  Nothing Worsened by:  Nothing tried Ineffective treatments:  None tried Associated symptoms: neck pain   Associated symptoms: no abdominal pain, no chest pain, no cough, no fever, no headaches and no vomiting     Past Medical History  Diagnosis Date  . Hypertension   . Hypothyroidism   . Anxiety   . Depression   . Arthritis   . Refusal of blood transfusions as patient is Jehovah's Witness   . Unspecified vitamin D deficiency   . Snoring 05/06/2013  . Chronic allergic rhinitis 05/06/2013   Past Surgical History  Procedure Laterality Date  . Cholecystectomy    . Tendon release      right wrist  . Knee arthroscopy      Right  . Plantar fascia surgery      right foot  . Dilation and curettage of uterus    . Total knee arthroplasty  12/14/2011    right knee  . Knee arthroplasty  12/14/2011    Procedure: COMPUTER ASSISTED TOTAL KNEE ARTHROPLASTY;  Surgeon: Alta Corning, MD;  Location: [facility];  Service: Orthopedics;  Laterality: Right;  . Femur im nail Right 02/28/2012    Procedure: OPEN REDUCTION INTERNAL FIXATION (ORIF) RIGHT KNEE;  Surgeon: Alta Corning, MD;  Location: [facility];  Service: Orthopedics;  Laterality: Right;   Family History  Problem Relation Age of Onset  . Anemia Mother   . Hypertension Mother   . Heart attack Father    History  Substance Use Topics  . Smoking status: Never Smoker   . Smokeless  tobacco: Never Used  . Alcohol Use: Yes     Comment: very rarely   OB History    No data available     Review of Systems  Constitutional: Negative for fever and fatigue.  HENT: Negative for congestion and drooling.   Eyes: Negative for pain.  Respiratory: Positive for shortness of breath. Negative for cough.   Cardiovascular: Negative for chest pain.  Gastrointestinal: Negative for nausea, vomiting, abdominal pain and diarrhea.  Genitourinary: Negative for dysuria and hematuria.  Musculoskeletal: Positive for neck pain. Negative for back pain and gait problem.  Skin: Negative for color change.  Neurological: Negative for dizziness and headaches.  Hematological: Negative for adenopathy.  Psychiatric/Behavioral: Negative for behavioral problems.  All other systems reviewed and are negative.     Allergies  Review of patient's allergies indicates no known allergies.  Home Medications   Prior to Admission medications   Medication Sig Start Date End Date Taking? Authorizing Provider  METOPROLOL TARTRATE PO Take by mouth.   Yes Historical Provider, MD  ALPRAZolam Duanne Moron) 0.5 MG tablet Take 0.5 mg by mouth at bedtime as needed for anxiety.    Historical Provider, MD  amitriptyline (ELAVIL) 50 MG tablet  01/13/14   Historical Provider, MD  buPROPion (ZYBAN) 150 MG 12 hr tablet Take 150 mg by mouth daily.    Historical Provider, MD  DULoxetine (CYMBALTA) 30 MG capsule Take 30 mg by mouth. 12/02/13   Historical Provider, MD  hydrochlorothiazide (MICROZIDE) 12.5 MG capsule Take 12.5 mg by mouth. 12/02/13 12/02/14  Historical Provider, MD  JINTELI 1-5 MG-MCG TABS See admin instructions. 11/29/13   Historical Provider, MD  levothyroxine (SYNTHROID, LEVOTHROID) 75 MCG tablet Take by mouth. 11/11/13   Historical Provider, MD  losartan (COZAAR) 25 MG tablet Take 25 mg by mouth daily.    Historical Provider, MD  Suvorexant (BELSOMRA) 10 MG TABS Take 10 mg by mouth Nightly. 01/14/14   Asencion Partridge  Dohmeier, MD  Vitamin D, Ergocalciferol, (DRISDOL) 50000 UNITS CAPS capsule TAKE ONE CAPSULE BY MOUTH ONCE A WEEK 10/14/13   Historical Provider, MD   BP 117/71 mmHg  Pulse 112  Temp(Src) 98.5 F (36.9 C) (Oral)  Resp 18  Ht 5\' 9"  (1.753 m)  Wt 248 lb (112.492 kg)  BMI 36.61 kg/m2  SpO2 100% Physical Exam  Constitutional: She is oriented to person, place, and time. She appears well-developed and well-nourished.  HENT:  Head: Normocephalic.  Mouth/Throat: Oropharynx is clear and moist. No oropharyngeal exudate.  Eyes: Conjunctivae and EOM are normal. Pupils are equal, round, and reactive to light.  Neck: Normal range of motion. Neck supple.  Cardiovascular: Regular rhythm, normal heart sounds and intact distal pulses.  Exam reveals no gallop and no friction rub.   No murmur heard. HR 108  Pulmonary/Chest: Effort normal and breath sounds normal. No respiratory distress. She has no wheezes.  Abdominal: Soft. Bowel sounds are normal. There is no tenderness. There is no rebound and no guarding.  Musculoskeletal: Normal range of motion. She exhibits no edema or tenderness.  Neurological: She is alert and oriented to person, place, and time.  Skin: Skin is warm and dry.  Psychiatric: She has a normal mood and affect. Her behavior is normal.  Nursing note and vitals reviewed.   ED Course  Procedures (including critical care time) Labs Review Labs Reviewed  CBC WITH DIFFERENTIAL/PLATELET - Abnormal; Notable for the following:    HCT 35.7 (*)    All other components within normal limits  COMPREHENSIVE METABOLIC PANEL - Abnormal; Notable for the following:    Creatinine, Ser 1.43 (*)    GFR calc non Af Amer 39 (*)    GFR calc Af Amer 45 (*)    All other components within normal limits  D-DIMER, QUANTITATIVE - Abnormal; Notable for the following:    D-Dimer, Quant 0.89 (*)    All other components within normal limits  TROPONIN I  TROPONIN I    Imaging Review Dg Chest 2  View  02/11/2014   CLINICAL DATA:  59 year old female with tachycardia and shortness of Breath. Initial encounter.  EXAM: CHEST  2 VIEW  COMPARISON:  Chest CTA 02/03/2012 and earlier.  FINDINGS: Lower lung volumes today. Stable cardiac size and mediastinal contours. Visualized tracheal air column is within normal limits. No pneumothorax, pulmonary edema, pleural effusion or confluent pulmonary opacity. Stable cholecystectomy clips. No acute osseous abnormality identified.  IMPRESSION: Lower lung volumes.  No acute cardiopulmonary abnormality.   Electronically Signed   By: Genevie Ann M.D.   On: 02/11/2014 12:48   Ct Angio Chest Pe W/cm &/or Wo Cm  02/11/2014   CLINICAL DATA:  One month history of chest pain and shortness of breath  EXAM: CT ANGIOGRAPHY CHEST WITH CONTRAST  TECHNIQUE: Multidetector CT imaging of the chest was performed using the standard protocol during bolus administration of intravenous contrast. Multiplanar  CT image reconstructions and MIPs were obtained to evaluate the vascular anatomy.  CONTRAST:  80mL OMNIPAQUE IOHEXOL 350 MG/ML SOLN  COMPARISON:  Chest radiograph February 11, 2014 and chest CT February 03, 2012  FINDINGS: There is no demonstrable pulmonary embolus. There is slight prominence of the ascending aorta with a maximum transverse diameter of 3.7 cm. 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  There is a stable 4 mm nodular opacity in the lateral segment of the left lower lobe. There is slight dependent atelectasis in both lung bases. There is no airspace consolidation or edema.  There is no appreciable thoracic adenopathy. The pericardium is not thickened. Thyroid appears normal. There is left ventricular hypertrophy.  There is stable elevation of the right hemidiaphragm.  In the visualized upper abdomen, gallbladder is absent.  There are no blastic or lytic bone lesions.  Review of the MIP images confirms the above findings.  IMPRESSION: No demonstrable pulmonary  embolus. No edema or consolidation. Small nodular opacity in the left base is stable consistent with benign etiology. There is left ventricular hypertrophy.  Mild prominence in the ascending thoracic aorta with a maximum diameter of 3.7 cm. Recommend annual imaging followup by CTA or MRA. This recommendation follows 2010 ACCF/AHA/AATS/ACR/ASA/SCA/SCAI/SIR/STS/SVM Guidelines for the Diagnosis and Management of Patients with Thoracic Aortic Disease. Circulation. 2010; 121: M754-G920   Electronically Signed   By: Lowella Grip III M.D.   On: 02/11/2014 13:56     EKG Interpretation   Date/Time:  Thursday February 11 2014 11:11:20 EST Ventricular Rate:  124 PR Interval:  170 QRS Duration: 96 QT Interval:  304 QTC Calculation: 436 R Axis:   69 Text Interpretation:  Sinus tachycardia Low voltage QRS Borderline ECG  Otherwise no significant change Confirmed by Meylin Stenzel  MD, Hamsa Laurich ([id number])  on 02/11/2014 11:25:26 AM      MDM   Final diagnoses:  SOB (shortness of breath)  Palpitations    11:57 AM 59 y.o. female w hx of HTN, hypothyroidism who presents with right shoulder and right neck pain as well as palpitations. She states that she has had palpitations  For the last month and was started on metoprolol by her PCP. She notes that she had about 30 minutes a right shoulder and right neck cramping this morning around 8 AM which resolved. She also notes new onset mild shortness of breath which began yesterday and has been persistent. She denies any chest pain. She is afebrile and mildly tachycardic here. Vital signs otherwise unremarkable.we'll get screening labs including d-dimer.  3:16 PM: I interpreted/reviewed the labs and/or imaging which were non-contributory.  Delta trop neg. Pt continues to appear well on exam. HR dec w/ IVF. Pt advised about CT findings and rec for yearly CT.   I have discussed the diagnosis/risks/treatment options with the patient and believe the pt to be eligible for  discharge home to follow-up with cardiology. We also discussed returning to the ED immediately if new or worsening sx occur. We discussed the sx which are most concerning (e.g., worsening pain/sob, fever) that necessitate immediate return. Medications administered to the patient during their visit and any new prescriptions provided to the patient are listed below.  Medications given during this visit Medications  sodium chloride 0.9 % bolus 500 mL (0 mLs Intravenous Stopped 02/11/14 1302)  sodium chloride 0.9 % bolus 500 mL (500 mLs Intravenous New Bag/Given 02/11/14 1303)  iohexol (OMNIPAQUE) 350 MG/ML injection 80 mL (80 mLs Intravenous Contrast Given 02/11/14 1327)  New Prescriptions   No medications on file      Pamella Pert, MD 02/11/14 [phone]

## 2014-03-05 ENCOUNTER — Encounter: Payer: Self-pay | Admitting: Cardiovascular Disease

## 2014-09-03 ENCOUNTER — Telehealth: Payer: Self-pay | Admitting: Neurology

## 2014-09-03 NOTE — Telephone Encounter (Signed)
Tiffany Yu with [facility] inquiring if note for CPAP supplies could be signed, dated and faxed back. The fax number is [phone]. She is aware Dr Brett Fairy is out of office until Tuesday.

## 2014-09-09 NOTE — Telephone Encounter (Signed)
I faxed referral order for cpap supplies.  [phone] ([phone]).

## 2014-09-13 ENCOUNTER — Telehealth: Payer: Self-pay

## 2014-09-13 NOTE — Telephone Encounter (Signed)
Pt is trying to get cpap supplies. Received notice from [facility] that her supplies will not be covered unless she comes in for an office visit to prove cpap compliance (pt has not been compliant recently). I called and advised pt and made with her an appt for 10/13 at 8:30. Pt verbalized understanding to arrive 15 minutes early and bring cpap machine.

## 2014-10-14 ENCOUNTER — Telehealth: Payer: Self-pay

## 2014-10-14 ENCOUNTER — Ambulatory Visit: Payer: Self-pay | Admitting: Neurology

## 2014-10-14 NOTE — Telephone Encounter (Signed)
Pt did not show for their appt with Dr. Dohmeier today.  

## 2014-10-15 ENCOUNTER — Encounter: Payer: Self-pay | Admitting: Neurology

## 2015-01-17 ENCOUNTER — Ambulatory Visit: Payer: 59 | Admitting: Adult Health

## 2015-01-26 ENCOUNTER — Ambulatory Visit: Payer: 59 | Admitting: Adult Health

## 2015-02-17 ENCOUNTER — Emergency Department (INDEPENDENT_AMBULATORY_CARE_PROVIDER_SITE_OTHER): Payer: 59

## 2015-02-17 ENCOUNTER — Emergency Department (INDEPENDENT_AMBULATORY_CARE_PROVIDER_SITE_OTHER)
Admission: EM | Admit: 2015-02-17 | Discharge: 2015-02-17 | Disposition: A | Discharge status: Home / Self Care | Payer: 59 | Source: Home / Self Care | Attending: Family Medicine | Admitting: Family Medicine

## 2015-02-17 ENCOUNTER — Encounter: Payer: Self-pay | Admitting: *Deleted

## 2015-02-17 DIAGNOSIS — W1839XA Other fall on same level, initial encounter: Secondary | ICD-10-CM | POA: Diagnosis not present

## 2015-02-17 DIAGNOSIS — M549 Dorsalgia, unspecified: Secondary | ICD-10-CM

## 2015-02-17 DIAGNOSIS — S2232XA Fracture of one rib, left side, initial encounter for closed fracture: Secondary | ICD-10-CM | POA: Diagnosis not present

## 2015-02-17 DIAGNOSIS — X58XXXA Exposure to other specified factors, initial encounter: Secondary | ICD-10-CM | POA: Diagnosis not present

## 2015-02-17 DIAGNOSIS — W010XXA Fall on same level from slipping, tripping and stumbling without subsequent striking against object, initial encounter: Secondary | ICD-10-CM

## 2015-02-17 MED ORDER — CYCLOBENZAPRINE HCL 10 MG PO TABS: 10.0000 mg | ORAL_TABLET | Freq: Two times a day (BID) | ORAL | Status: DC | PRN

## 2015-02-17 MED ORDER — TRAMADOL HCL 50 MG PO TABS: 50.0000 mg | ORAL_TABLET | Freq: Four times a day (QID) | ORAL | Status: DC | PRN

## 2015-02-17 NOTE — ED Provider Notes (Signed)
CSN: [id number]     Arrival date & time 02/17/15  0941 History   First MD Initiated Contact with Patient 02/17/15 0945     Chief Complaint  Patient presents with  . Back Pain   (Consider location/radiation/quality/duration/timing/severity/associated sxs/prior Treatment) HPI The pt is a 60yo female presenting to [facility] with c/o Left sided mid back and flank pain that started yesterday after she slipped and fell in the shower, hitting a towel rack and something else. She does not believe she hit her head during the fall.  Denies headache, nausea, vomiting or change in vision.  Denies LOC.  Back pain is aching and sharp, worse with certain movements, [id number], difficult to get into a comfortable position. Pt notes her back muscles seem to be getting more stiff, worsening the pain. She took a picture of a red mark on the Left side of her back from where she hit the towel rack yesterday. She took Aleve yesterday but no relief. No pain medication today PTA. Denies hx of back pain or surgeries. No other injuries.   Past Medical History  Diagnosis Date  . Hypertension   . Hypothyroidism   . Anxiety   . Depression   . Arthritis   . Refusal of blood transfusions as patient is Jehovah's Witness   . Unspecified vitamin D deficiency   . Snoring 05/06/2013  . Chronic allergic rhinitis 05/06/2013   Past Surgical History  Procedure Laterality Date  . Cholecystectomy    . Tendon release      right wrist  . Knee arthroscopy      Right  . Plantar fascia surgery      right foot  . Dilation and curettage of uterus    . Total knee arthroplasty  12/14/2011    right knee  . Knee arthroplasty  12/14/2011    Procedure: COMPUTER ASSISTED TOTAL KNEE ARTHROPLASTY;  Surgeon: Alta Corning, MD;  Location: [place];  Service: Orthopedics;  Laterality: Right;  . Femur im nail Right 02/28/2012    Procedure: OPEN REDUCTION INTERNAL FIXATION (ORIF) RIGHT KNEE;  Surgeon: Alta Corning, MD;  Location: [place];  Service:  Orthopedics;  Laterality: Right;   Family History  Problem Relation Age of Onset  . Anemia Mother   . Hypertension Mother   . Heart attack Father    Social History  Substance Use Topics  . Smoking status: Never Smoker   . Smokeless tobacco: Never Used  . Alcohol Use: Yes     Comment: very rarely   OB History    No data available     Review of Systems  Respiratory: Negative for cough and shortness of breath.   Cardiovascular: Positive for chest pain (Left side). Negative for palpitations and leg swelling.  Gastrointestinal: Negative for nausea, vomiting, abdominal pain and diarrhea.  Genitourinary: Positive for flank pain (Left). Negative for dysuria, frequency and hematuria.  Musculoskeletal: Positive for myalgias and back pain (Left mid back). Negative for joint swelling, arthralgias, neck pain and neck stiffness.  Skin: Positive for color change. Negative for wound.  Neurological: Negative for dizziness, syncope, light-headedness and headaches.    Allergies  Review of patient's allergies indicates no known allergies.  Home Medications   Prior to Admission medications   Medication Sig Start Date End Date Taking? Authorizing Provider  atorvastatin (LIPITOR) 10 MG tablet Take 10 mg by mouth daily.   Yes Historical Provider, MD  buPROPion (ZYBAN) 150 MG 12 hr tablet Take 150 mg by mouth daily.  Yes Historical Provider, MD  hydrochlorothiazide (MICROZIDE) 12.5 MG capsule Take 12.5 mg by mouth. 12/02/13 02/17/15 Yes Historical Provider, MD  levothyroxine (SYNTHROID, LEVOTHROID) 75 MCG tablet Take by mouth. 11/11/13  Yes Historical Provider, MD  METOPROLOL TARTRATE PO Take by mouth.   Yes Historical Provider, MD  zolpidem (AMBIEN) 10 MG tablet Take 10 mg by mouth at bedtime as needed for sleep.   Yes Historical Provider, MD  ALPRAZolam Duanne Moron) 0.5 MG tablet Take 0.5 mg by mouth at bedtime as needed for anxiety.    Historical Provider, MD  cyclobenzaprine (FLEXERIL) 10 MG tablet  Take 1 tablet (10 mg total) by mouth 2 (two) times daily as needed for muscle spasms. 02/17/15   Noland Fordyce, PA-C  JINTELI 1-5 MG-MCG TABS See admin instructions. 11/29/13   Historical Provider, MD  Suvorexant (BELSOMRA) 10 MG TABS Take 10 mg by mouth Nightly. 01/14/14   Asencion Partridge Dohmeier, MD  traMADol (ULTRAM) 50 MG tablet Take 1 tablet (50 mg total) by mouth every 6 (six) hours as needed. 02/17/15   Noland Fordyce, PA-C   Meds Ordered and Administered this Visit  Medications - No data to display  BP 125/78 mmHg  Pulse 93  Resp 16  Ht 5\' 8"  (1.727 m)  Wt 242 lb (109.77 kg)  BMI 36.80 kg/m2  SpO2 98% No data found.   Physical Exam  Constitutional: She is oriented to person, place, and time. She appears well-developed and well-nourished. No distress.  HENT:  Head: Normocephalic and atraumatic.  Eyes: Conjunctivae are normal. No scleral icterus.  Neck: Normal range of motion.  Cardiovascular: Normal rate, regular rhythm and normal heart sounds.   Pulmonary/Chest: Effort normal and breath sounds normal. No respiratory distress. She has no wheezes. She has no rales.   She exhibits tenderness.  Left posterior chest wall: skin in tact, erythema and mild ecchymosis, mild swelling with tenderness over posterior ribs. No crepitus.  Lungs: CTAB no respiratory distress.  Abdominal: Soft. She exhibits no distension. There is no tenderness.  Musculoskeletal: Normal range of motion.  Neurological: She is alert and oriented to person, place, and time. No cranial nerve deficit.  Skin: Skin is warm and dry. She is not diaphoretic.  Nursing note and vitals reviewed.   ED Course  Procedures (including critical care time)  Labs Review Labs Reviewed - No data to display  Imaging Review Dg Ribs Unilateral W/chest Left  02/17/2015  CLINICAL DATA:  Slipped and fell in the shower yesterday, pain and bruising left flank EXAM: LEFT RIBS AND CHEST - 3+ VIEW COMPARISON:  None. FINDINGS: Five views left  ribs submitted. No infiltrate or pulmonary edema. No pneumothorax. There is minimal displaced fracture of the left tenth rib. IMPRESSION: Minimal displaced fracture of the left tenth rib. No pneumothorax. No acute infiltrate or pulmonary edema. Electronically Signed   By: Lahoma Crocker M.D.   On: 02/17/2015 10:40      MDM   1. Left rib fracture, closed, initial encounter   2. Mid back pain on left side   3. Fall from slip, trip, or stumble, initial encounter    Pt c/o Left mid back pain after slip and fall in shower yesterday.  Mild erythema with ecchymosis and edema on Left mid-back/posterior ribs. No respiratory distress.  O2 Sat 98% on RA  Plain films: minimal displaced fracture of Left tenth rib. No pneumothorax  Rx: Flexeril and Tramadol Pt does have active prescriptions for bupropion, Xanax and Ambien.   Discussed risks of  drowsiness and respiratory depression if medications taken at the same time or more often than prescribed. Advised not to drink alcohol, drive or operate heavy machinery while taking medications.  Encouraged acetaminophen and ibuprofen for pain as well as using the Rib Belt provided and ice packs. Pt given incentive spirometer with home care instructions. F/u with PCP in 1-2 weeks if not improving. Sooner if worsening. Discussed symptoms that warrant emergent care in the ED. Patient verbalized understanding and agreement with treatment plan.     Noland Fordyce, PA-C 02/17/15 1117

## 2015-02-17 NOTE — ED Notes (Signed)
Pt reports falling getting out of the shower yesterday, hit her back on the rack and "something else". Taken Aleve yesterday, declined any medication while in office.

## 2015-02-17 NOTE — Discharge Instructions (Signed)
Tramadol is strong pain medication. While taking, do not drink alcohol, drive, or perform any other activities that requires focus while taking these medications.   Flexeril is a muscle relaxer and may cause drowsiness. Do not drink alcohol, drive, or operate heavy machinery while taking.  You may also alternate acetaminophen (Tylenol) 500mg  every 4-6 hours and ibuprofen 400-600mg  every 6-8 hours for pain.

## 2015-11-10 ENCOUNTER — Other Ambulatory Visit: Payer: Self-pay | Admitting: Obstetrics and Gynecology

## 2015-11-10 DIAGNOSIS — Z1231 Encounter for screening mammogram for malignant neoplasm of breast: Secondary | ICD-10-CM

## 2015-12-12 ENCOUNTER — Ambulatory Visit: Payer: Self-pay

## 2016-01-03 ENCOUNTER — Other Ambulatory Visit: Payer: Self-pay | Admitting: Physician Assistant

## 2016-01-03 DIAGNOSIS — K148 Other diseases of tongue: Secondary | ICD-10-CM

## 2016-01-05 ENCOUNTER — Ambulatory Visit
Admission: RE | Admit: 2016-01-05 | Discharge: 2016-01-05 | Disposition: A | Discharge status: Home / Self Care | Payer: 59 | Source: Ambulatory Visit | Attending: Physician Assistant | Admitting: Physician Assistant

## 2016-01-05 DIAGNOSIS — K148 Other diseases of tongue: Secondary | ICD-10-CM

## 2016-01-05 MED ORDER — IOPAMIDOL (ISOVUE-300) INJECTION 61%
75.0000 mL | Freq: Once | INTRAVENOUS | Status: AC | PRN
Start: 2016-01-05 — End: 2016-01-05
  Administered 2016-01-05: 75 mL via INTRAVENOUS

## 2016-02-06 NOTE — Progress Notes (Signed)
Left at message for Dr.Shoemaker that orders are needed for PAT appointment in the morning.

## 2016-02-06 NOTE — Pre-Procedure Instructions (Signed)
Leveta J Furry  02/06/2016      [id number] [address], [place] [address] [place] [address] Phone: [phone] Fax: [phone]    Your procedure is scheduled on Wed, Feb 7 @ 12:50 PM  Report to [facility] Admitting at 10:50 AM  Call this number if you have problems the morning of surgery:  [phone]   Remember:  Do not eat food or drink liquids after midnight.  Take these medicines the morning of surgery with A SIP OF WATER Bupropion(Zyban),Synthyroid(Levothroxine),and Tramadol(Ultram-if needed)             Stop taking Naproxen along with any Vitamins or Herbal Medications. No Goody's,BC's,Advil,Motrin,Ibuprofen,or Fish Oil.    Do not wear jewelry, make-up or nail polish.  Do not wear lotions, powders,perfumes, or deoderant.  Do not shave 48 hours prior to surgery.    Do not bring valuables to the hospital.  [facility] is not responsible for any belongings or valuables.  Contacts, dentures or bridgework may not be worn into surgery.  Leave your suitcase in the car.  After surgery it may be brought to your room.  For patients admitted to the hospital, discharge time will be determined by your treatment team.  Patients discharged the day of surgery will not be allowed to drive home.    [facility]  Before surgery, you can play an important role.  Because skin is not sterile, your skin needs to be as free of germs as possible.  You can reduce the number of germs on you skin by washing with CHG (chlorahexidine gluconate) soap before surgery.  CHG is an antiseptic cleaner which kills germs and bonds with the skin to continue killing germs even after washing.  Please DO NOT use if you have an allergy to CHG or antibacterial soaps.  If your skin becomes reddened/irritated stop using the CHG and inform your nurse when you arrive at Short Stay.  Do not shave (including legs and  underarms) for at least 48 hours prior to the first CHG shower.  You may shave your face.  Please follow these instructions carefully:   1.  Shower with CHG Soap the night before surgery and the                                morning of Surgery.  2.  If you choose to wash your hair, wash your hair first as usual with your       normal shampoo.  3.  After you shampoo, rinse your hair and body thoroughly to remove the                      Shampoo.  4.  Use CHG as you would any other liquid soap.  You can apply chg directly       to the skin and wash gently with scrungie or a clean washcloth.  5.  Apply the CHG Soap to your body ONLY FROM THE NECK DOWN.        Do not use on open wounds or open sores.  Avoid contact with your eyes,       ears, mouth and genitals (private parts).  Wash genitals (private parts)       with your normal soap.  6.  Wash thoroughly, paying special attention to the area where your  surgery        will be performed.  7.  Thoroughly rinse your body with warm water from the neck down.  8.  DO NOT shower/wash with your normal soap after using and rinsing off       the CHG Soap.  9.  Pat yourself dry with a clean towel.            10.  Wear clean pajamas.            11.  Place clean sheets on your bed the night of your first shower and do not        sleep with pets.  Day of Surgery  Do not apply any lotions/deoderants the morning of surgery.  Please wear clean clothes to the hospital/surgery center.   Please read over the following fact sheets that you were given. Pain Booklet, Coughing and Deep Breathing and Surgical Site Infection Prevention

## 2016-02-07 ENCOUNTER — Encounter (HOSPITAL_COMMUNITY): Payer: Self-pay

## 2016-02-07 ENCOUNTER — Encounter (HOSPITAL_COMMUNITY)
Admission: RE | Admit: 2016-02-07 | Discharge: 2016-02-07 | Disposition: A | Discharge status: Home / Self Care | Payer: 59 | Source: Ambulatory Visit | Attending: Otolaryngology | Admitting: Otolaryngology

## 2016-02-07 DIAGNOSIS — C029 Malignant neoplasm of tongue, unspecified: Secondary | ICD-10-CM | POA: Diagnosis not present

## 2016-02-07 DIAGNOSIS — Z01812 Encounter for preprocedural laboratory examination: Secondary | ICD-10-CM | POA: Diagnosis not present

## 2016-02-07 DIAGNOSIS — Z01810 Encounter for preprocedural cardiovascular examination: Secondary | ICD-10-CM | POA: Insufficient documentation

## 2016-02-07 HISTORY — DX: Hyperlipidemia, unspecified: E78.5

## 2016-02-07 HISTORY — DX: Insomnia, unspecified: G47.00

## 2016-02-07 HISTORY — DX: Personal history of colon polyps, unspecified: Z86.0100

## 2016-02-07 HISTORY — DX: Personal history of colonic polyps: Z86.010

## 2016-02-07 LAB — CBC
HCT: 38.7 % (ref 36.0–46.0)
Hemoglobin: 13.7 g/dL (ref 12.0–15.0)
MCH: 28.7 pg (ref 26.0–34.0)
MCHC: 35.4 g/dL (ref 30.0–36.0)
MCV: 81.1 fL (ref 78.0–100.0)
PLATELETS: 230 10*3/uL (ref 150–400)
RBC: 4.77 MIL/uL (ref 3.87–5.11)
RDW: 14.2 % (ref 11.5–15.5)
WBC: 8.4 10*3/uL (ref 4.0–10.5)

## 2016-02-07 LAB — BASIC METABOLIC PANEL
Anion gap: 11 (ref 5–15)
BUN: 9 mg/dL (ref 6–20)
CALCIUM: 9.3 mg/dL (ref 8.9–10.3)
CHLORIDE: 99 mmol/L — AB (ref 101–111)
CO2: 29 mmol/L (ref 22–32)
CREATININE: 1.29 mg/dL — AB (ref 0.44–1.00)
GFR, EST AFRICAN AMERICAN: 50 mL/min — AB (ref >60)
GFR, EST NON AFRICAN AMERICAN: 43 mL/min — AB (ref >60)
Glucose, Bld: 106 mg/dL — ABNORMAL HIGH (ref 65–99)
Potassium: 3 mmol/L — ABNORMAL LOW (ref 3.5–5.1)
SODIUM: 139 mmol/L (ref 135–145)

## 2016-02-07 LAB — NO BLOOD PRODUCTS

## 2016-02-07 NOTE — Progress Notes (Addendum)
Cardiologist denies  Medical Md is Dr.David Bouska  Echo 4+ yrs ago done as baseline  Stress test 4+ yrs ago done as baseline  Heart cath denies  EKG denies in past yr  CXR denies in past yr  Sleep study in epic from 2015

## 2016-02-08 ENCOUNTER — Inpatient Hospital Stay (HOSPITAL_COMMUNITY)
Admission: RE | Admit: 2016-02-08 | Discharge: 2016-02-11 | DRG: 129 | Disposition: A | Discharge status: Home / Self Care | Payer: 59 | Source: Ambulatory Visit | Attending: Otolaryngology | Admitting: Otolaryngology

## 2016-02-08 ENCOUNTER — Encounter (HOSPITAL_COMMUNITY): Payer: Self-pay | Admitting: Anesthesiology

## 2016-02-08 ENCOUNTER — Encounter (HOSPITAL_COMMUNITY)
Admission: RE | Disposition: A | Discharge status: Home / Self Care | Payer: Self-pay | Source: Ambulatory Visit | Attending: Otolaryngology

## 2016-02-08 ENCOUNTER — Inpatient Hospital Stay (HOSPITAL_COMMUNITY): Payer: 59 | Admitting: Anesthesiology

## 2016-02-08 ENCOUNTER — Inpatient Hospital Stay (HOSPITAL_COMMUNITY): Payer: 59 | Admitting: Emergency Medicine

## 2016-02-08 DIAGNOSIS — E039 Hypothyroidism, unspecified: Secondary | ICD-10-CM | POA: Diagnosis present

## 2016-02-08 DIAGNOSIS — R590 Localized enlarged lymph nodes: Secondary | ICD-10-CM | POA: Diagnosis present

## 2016-02-08 DIAGNOSIS — F329 Major depressive disorder, single episode, unspecified: Secondary | ICD-10-CM | POA: Diagnosis present

## 2016-02-08 DIAGNOSIS — Z86010 Personal history of colonic polyps: Secondary | ICD-10-CM | POA: Diagnosis not present

## 2016-02-08 DIAGNOSIS — C770 Secondary and unspecified malignant neoplasm of lymph nodes of head, face and neck: Secondary | ICD-10-CM | POA: Diagnosis present

## 2016-02-08 DIAGNOSIS — C023 Malignant neoplasm of anterior two-thirds of tongue, part unspecified: Secondary | ICD-10-CM | POA: Diagnosis present

## 2016-02-08 DIAGNOSIS — C029 Malignant neoplasm of tongue, unspecified: Secondary | ICD-10-CM | POA: Diagnosis present

## 2016-02-08 DIAGNOSIS — Z96651 Presence of right artificial knee joint: Secondary | ICD-10-CM | POA: Diagnosis present

## 2016-02-08 DIAGNOSIS — I10 Essential (primary) hypertension: Secondary | ICD-10-CM | POA: Diagnosis present

## 2016-02-08 DIAGNOSIS — Z8249 Family history of ischemic heart disease and other diseases of the circulatory system: Secondary | ICD-10-CM

## 2016-02-08 DIAGNOSIS — E785 Hyperlipidemia, unspecified: Secondary | ICD-10-CM | POA: Diagnosis present

## 2016-02-08 HISTORY — PX: RADICAL NECK DISSECTION: SHX2284

## 2016-02-08 HISTORY — PX: EXCISION OF TONGUE LESION: SHX6434

## 2016-02-08 LAB — CBC
HCT: 34 % — ABNORMAL LOW (ref 36.0–46.0)
Hemoglobin: 12.1 g/dL (ref 12.0–15.0)
MCH: 28.8 pg (ref 26.0–34.0)
MCHC: 35.6 g/dL (ref 30.0–36.0)
MCV: 81 fL (ref 78.0–100.0)
PLATELETS: 203 10*3/uL (ref 150–400)
RBC: 4.2 MIL/uL (ref 3.87–5.11)
RDW: 14.2 % (ref 11.5–15.5)
WBC: 9.4 10*3/uL (ref 4.0–10.5)

## 2016-02-08 LAB — CREATININE, SERUM
Creatinine, Ser: 1.25 mg/dL — ABNORMAL HIGH (ref 0.44–1.00)
GFR, EST AFRICAN AMERICAN: 52 mL/min — AB (ref >60)
GFR, EST NON AFRICAN AMERICAN: 45 mL/min — AB (ref >60)

## 2016-02-08 SURGERY — EXCISION, LESION, TONGUE
Anesthesia: General | Site: Neck | Laterality: Left

## 2016-02-08 MED ORDER — DEXTROSE 5 % IV SOLN
INTRAVENOUS | Status: DC | PRN
  Administered 2016-02-08: 50 ug/min via INTRAVENOUS

## 2016-02-08 MED ORDER — ZOLPIDEM TARTRATE 5 MG PO TABS: 10.0000 mg | ORAL_TABLET | Freq: Every day | ORAL | Status: DC

## 2016-02-08 MED ORDER — FENTANYL CITRATE (PF) 100 MCG/2ML IJ SOLN: 25.0000 ug | INTRAMUSCULAR | Status: DC | PRN

## 2016-02-08 MED ORDER — ONDANSETRON HCL 4 MG/2ML IJ SOLN: INTRAMUSCULAR | Status: DC | PRN

## 2016-02-08 MED ORDER — FENTANYL CITRATE (PF) 100 MCG/2ML IJ SOLN
INTRAMUSCULAR | Status: AC
  Filled 2016-02-08: qty 2

## 2016-02-08 MED ORDER — MIDAZOLAM HCL 2 MG/2ML IJ SOLN
INTRAMUSCULAR | Status: AC
  Filled 2016-02-08: qty 2

## 2016-02-08 MED ORDER — HEPARIN SODIUM (PORCINE) 5000 UNIT/ML IJ SOLN
5000.0000 [IU] | Freq: Three times a day (TID) | INTRAMUSCULAR | Status: DC
  Administered 2016-02-08 – 2016-02-11 (×8): 5000 [IU] via SUBCUTANEOUS
  Filled 2016-02-08 (×7): qty 1

## 2016-02-08 MED ORDER — ONDANSETRON HCL 4 MG/2ML IJ SOLN: 4.0000 mg | INTRAMUSCULAR | Status: DC | PRN

## 2016-02-08 MED ORDER — ALPRAZOLAM 0.5 MG PO TABS: 0.5000 mg | ORAL_TABLET | Freq: Every evening | ORAL | Status: DC | PRN

## 2016-02-08 MED ORDER — SUGAMMADEX SODIUM 200 MG/2ML IV SOLN
INTRAVENOUS | Status: AC
  Filled 2016-02-08: qty 2

## 2016-02-08 MED ORDER — ARTIFICIAL TEARS OP OINT
TOPICAL_OINTMENT | OPHTHALMIC | Status: DC | PRN
  Administered 2016-02-08: 1 via OPHTHALMIC

## 2016-02-08 MED ORDER — OXYCODONE HCL 5 MG/5ML PO SOLN
5.0000 mg | Freq: Once | ORAL | Status: AC | PRN
  Administered 2016-02-08: 5 mg via ORAL

## 2016-02-08 MED ORDER — CEFAZOLIN SODIUM 1 G IJ SOLR
INTRAMUSCULAR | Status: AC
  Filled 2016-02-08: qty 20

## 2016-02-08 MED ORDER — BACITRACIN ZINC 500 UNIT/GM EX OINT
1.0000 "application " | TOPICAL_OINTMENT | Freq: Three times a day (TID) | CUTANEOUS | Status: DC
  Administered 2016-02-08 – 2016-02-11 (×8): 1 via TOPICAL

## 2016-02-08 MED ORDER — DEXTROSE IN LACTATED RINGERS 5 % IV SOLN
INTRAVENOUS | Status: DC
  Administered 2016-02-08 – 2016-02-11 (×5): via INTRAVENOUS

## 2016-02-08 MED ORDER — ONDANSETRON HCL 4 MG/2ML IJ SOLN
4.0000 mg | Freq: Once | INTRAMUSCULAR | Status: DC | PRN
Start: 2016-02-08 — End: 2016-02-08

## 2016-02-08 MED ORDER — HYDROCODONE-ACETAMINOPHEN 7.5-325 MG/15ML PO SOLN: 10.0000 mL | ORAL | 0 refills | Status: DC | PRN

## 2016-02-08 MED ORDER — SUCCINYLCHOLINE CHLORIDE 20 MG/ML IJ SOLN
INTRAMUSCULAR | Status: DC | PRN
  Administered 2016-02-08: 100 mg via INTRAVENOUS

## 2016-02-08 MED ORDER — BACITRACIN ZINC 500 UNIT/GM EX OINT
TOPICAL_OINTMENT | CUTANEOUS | Status: DC | PRN
  Administered 2016-02-08: 1 via TOPICAL

## 2016-02-08 MED ORDER — 0.9 % SODIUM CHLORIDE (POUR BTL) OPTIME
TOPICAL | Status: DC | PRN
  Administered 2016-02-08: 1000 mL

## 2016-02-08 MED ORDER — PHENYLEPHRINE 40 MCG/ML (10ML) SYRINGE FOR IV PUSH (FOR BLOOD PRESSURE SUPPORT)
PREFILLED_SYRINGE | INTRAVENOUS | Status: AC
  Filled 2016-02-08: qty 10

## 2016-02-08 MED ORDER — OXYCODONE HCL 5 MG/5ML PO SOLN
ORAL | Status: AC
Start: 2016-02-08 — End: 2016-02-09
  Filled 2016-02-08: qty 5

## 2016-02-08 MED ORDER — STERILE WATER FOR IRRIGATION IR SOLN
Status: DC | PRN
  Administered 2016-02-08: 1000 mL

## 2016-02-08 MED ORDER — CEFAZOLIN SODIUM-DEXTROSE 2-4 GM/100ML-% IV SOLN
2.0000 g | Freq: Three times a day (TID) | INTRAVENOUS | Status: AC
  Administered 2016-02-08 – 2016-02-09 (×3): 2 g via INTRAVENOUS
  Filled 2016-02-08 (×3): qty 100

## 2016-02-08 MED ORDER — SUCCINYLCHOLINE CHLORIDE 200 MG/10ML IV SOSY
PREFILLED_SYRINGE | INTRAVENOUS | Status: AC
  Filled 2016-02-08: qty 10

## 2016-02-08 MED ORDER — ROCURONIUM BROMIDE 100 MG/10ML IV SOLN
INTRAVENOUS | Status: DC | PRN
  Administered 2016-02-08: 50 mg via INTRAVENOUS

## 2016-02-08 MED ORDER — OXYCODONE HCL 5 MG PO TABS: 5.0000 mg | ORAL_TABLET | Freq: Once | ORAL | Status: AC | PRN

## 2016-02-08 MED ORDER — LIDOCAINE-EPINEPHRINE (PF) 1 %-1:200000 IJ SOLN
INTRAMUSCULAR | Status: DC | PRN
  Administered 2016-02-08: 4 mL

## 2016-02-08 MED ORDER — ONDANSETRON HCL 4 MG PO TABS: 4.0000 mg | ORAL_TABLET | ORAL | Status: DC | PRN

## 2016-02-08 MED ORDER — HYDROMORPHONE HCL 1 MG/ML IJ SOLN
INTRAMUSCULAR | Status: AC
  Administered 2016-02-08: 0.5 mg
  Filled 2016-02-08: qty 2

## 2016-02-08 MED ORDER — MORPHINE SULFATE (PF) 2 MG/ML IV SOLN
2.0000 mg | INTRAVENOUS | Status: DC | PRN
  Administered 2016-02-08 – 2016-02-10 (×3): 2 mg via INTRAVENOUS
  Filled 2016-02-08: qty 1
  Filled 2016-02-08: qty 2
  Filled 2016-02-08: qty 1

## 2016-02-08 MED ORDER — ATORVASTATIN CALCIUM 10 MG PO TABS
10.0000 mg | ORAL_TABLET | Freq: Every day | ORAL | Status: DC
  Administered 2016-02-09 – 2016-02-10 (×2): 10 mg via ORAL
  Filled 2016-02-08 (×2): qty 1

## 2016-02-08 MED ORDER — ZOLPIDEM TARTRATE 5 MG PO TABS
5.0000 mg | ORAL_TABLET | Freq: Every day | ORAL | Status: DC
  Administered 2016-02-08 – 2016-02-10 (×3): 5 mg via ORAL
  Filled 2016-02-08 (×3): qty 1

## 2016-02-08 MED ORDER — MIDAZOLAM HCL 5 MG/5ML IJ SOLN
INTRAMUSCULAR | Status: DC | PRN
  Administered 2016-02-08: 2 mg via INTRAVENOUS

## 2016-02-08 MED ORDER — FENTANYL CITRATE (PF) 100 MCG/2ML IJ SOLN
INTRAMUSCULAR | Status: AC
  Filled 2016-02-08: qty 4

## 2016-02-08 MED ORDER — LEVOTHYROXINE SODIUM 75 MCG PO TABS
75.0000 ug | ORAL_TABLET | Freq: Every day | ORAL | Status: DC
  Administered 2016-02-09 – 2016-02-11 (×3): 75 ug via ORAL
  Filled 2016-02-08 (×3): qty 1

## 2016-02-08 MED ORDER — CEFAZOLIN SODIUM 1 G IJ SOLR
INTRAMUSCULAR | Status: DC | PRN
  Administered 2016-02-08: 2000 mg via INTRAMUSCULAR

## 2016-02-08 MED ORDER — ONDANSETRON HCL 4 MG/2ML IJ SOLN
INTRAMUSCULAR | Status: AC
  Filled 2016-02-08: qty 4

## 2016-02-08 MED ORDER — LIDOCAINE HCL (CARDIAC) 20 MG/ML IV SOLN
INTRAVENOUS | Status: DC | PRN
  Administered 2016-02-08: 60 mg via INTRAVENOUS

## 2016-02-08 MED ORDER — LACTATED RINGERS IV SOLN
INTRAVENOUS | Status: DC
  Administered 2016-02-08: 50 mL/h via INTRAVENOUS

## 2016-02-08 MED ORDER — PROPOFOL 10 MG/ML IV BOLUS
INTRAVENOUS | Status: AC
  Filled 2016-02-08: qty 20

## 2016-02-08 MED ORDER — PROPOFOL 10 MG/ML IV BOLUS
INTRAVENOUS | Status: DC | PRN
  Administered 2016-02-08: 150 mg via INTRAVENOUS
  Administered 2016-02-08: 25 mg via INTRAVENOUS

## 2016-02-08 MED ORDER — BACITRACIN ZINC 500 UNIT/GM EX OINT
TOPICAL_OINTMENT | CUTANEOUS | Status: AC
  Filled 2016-02-08: qty 28.35

## 2016-02-08 MED ORDER — ONDANSETRON HCL 4 MG/2ML IJ SOLN
INTRAMUSCULAR | Status: DC | PRN
  Administered 2016-02-08: 4 mg via INTRAVENOUS

## 2016-02-08 MED ORDER — LIDOCAINE 2% (20 MG/ML) 5 ML SYRINGE
INTRAMUSCULAR | Status: AC
  Filled 2016-02-08: qty 10

## 2016-02-08 MED ORDER — HYDROCODONE-ACETAMINOPHEN 7.5-325 MG/15ML PO SOLN
10.0000 mL | ORAL | Status: DC | PRN
  Administered 2016-02-09 – 2016-02-11 (×8): 15 mL via ORAL
  Filled 2016-02-08 (×8): qty 15

## 2016-02-08 MED ORDER — LACTATED RINGERS IV SOLN: INTRAVENOUS | Status: DC | PRN

## 2016-02-08 MED ORDER — LACTATED RINGERS IV SOLN
INTRAVENOUS | Status: DC | PRN
  Administered 2016-02-08 (×2): via INTRAVENOUS

## 2016-02-08 MED ORDER — FENTANYL CITRATE (PF) 100 MCG/2ML IJ SOLN
INTRAMUSCULAR | Status: DC | PRN
  Administered 2016-02-08 (×3): 50 ug via INTRAVENOUS
  Administered 2016-02-08: 100 ug via INTRAVENOUS
  Administered 2016-02-08 (×3): 50 ug via INTRAVENOUS

## 2016-02-08 MED ORDER — BUPROPION HCL ER (SR) 150 MG PO TB12
150.0000 mg | ORAL_TABLET | Freq: Every day | ORAL | Status: DC
  Administered 2016-02-09 – 2016-02-11 (×3): 150 mg via ORAL
  Filled 2016-02-08 (×4): qty 1

## 2016-02-08 SURGICAL SUPPLY — 57 items
ATTRACTOMAT 16X20 MAGNETIC DRP (DRAPES) IMPLANT
BLADE SURG 15 STRL LF DISP TIS (BLADE) ×6 IMPLANT
BLADE SURG 15 STRL SS (BLADE) ×2
BLADE SURG ROTATE 9660 (MISCELLANEOUS) ×4 IMPLANT
BNDG GAUZE ELAST 4 BULKY (GAUZE/BANDAGES/DRESSINGS) IMPLANT
CANISTER SUCTION 2500CC (MISCELLANEOUS) ×4 IMPLANT
CLEANER TIP ELECTROSURG 2X2 (MISCELLANEOUS) ×4 IMPLANT
CONT SPEC 4OZ CLIKSEAL STRL BL (MISCELLANEOUS) ×20 IMPLANT
CORDS BIPOLAR (ELECTRODE) ×4 IMPLANT
COVER SURGICAL LIGHT HANDLE (MISCELLANEOUS) ×4 IMPLANT
DRAIN SNY 7 FPER (WOUND CARE) ×4 IMPLANT
DRAPE SURG 17X23 STRL (DRAPES) ×8 IMPLANT
ELECT COATED BLADE 2.86 ST (ELECTRODE) ×8 IMPLANT
ELECT REM PT RETURN 9FT ADLT (ELECTROSURGICAL) ×4
ELECTRODE REM PT RTRN 9FT ADLT (ELECTROSURGICAL) ×3 IMPLANT
EVACUATOR SILICONE 100CC (DRAIN) ×4 IMPLANT
FORCEPS BIPOLAR SPETZLER 8 1.0 (NEUROSURGERY SUPPLIES) ×4 IMPLANT
GAUZE SPONGE 4X4 16PLY XRAY LF (GAUZE/BANDAGES/DRESSINGS) ×20 IMPLANT
GLOVE BIO SURGEON STRL SZ 6.5 (GLOVE) ×4 IMPLANT
GLOVE BIOGEL M 7.0 STRL (GLOVE) ×12 IMPLANT
GLOVE BIOGEL PI IND STRL 7.0 (GLOVE) ×3 IMPLANT
GLOVE BIOGEL PI INDICATOR 7.0 (GLOVE) ×1
GLOVE ECLIPSE 6.5 STRL STRAW (GLOVE) ×4 IMPLANT
GOWN STRL REUS W/ TWL LRG LVL3 (GOWN DISPOSABLE) ×9 IMPLANT
GOWN STRL REUS W/TWL LRG LVL3 (GOWN DISPOSABLE) ×3
KIT BASIN OR (CUSTOM PROCEDURE TRAY) ×4 IMPLANT
KIT ROOM TURNOVER OR (KITS) ×4 IMPLANT
LOCATOR NERVE 3 VOLT (DISPOSABLE) ×4 IMPLANT
NEEDLE HYPO 25GX1X1/2 BEV (NEEDLE) ×8 IMPLANT
NS IRRIG 1000ML POUR BTL (IV SOLUTION) ×4 IMPLANT
PAD ARMBOARD 7.5X6 YLW CONV (MISCELLANEOUS) ×4 IMPLANT
PENCIL BUTTON HOLSTER BLD 10FT (ELECTRODE) ×4 IMPLANT
SPECIMEN JAR MEDIUM (MISCELLANEOUS) ×4 IMPLANT
SPONGE INTESTINAL PEANUT (DISPOSABLE) ×4 IMPLANT
SPONGE LAP 18X18 X RAY DECT (DISPOSABLE) IMPLANT
STAPLER SKIN 35 WIDE (STAPLE) ×4 IMPLANT
STAPLER VISISTAT 35W (STAPLE) ×4 IMPLANT
SUT CHROMIC 3 0 SH 27 (SUTURE) IMPLANT
SUT CHROMIC 5 0 P 3 (SUTURE) IMPLANT
SUT ETHILON 2 0 FS 18 (SUTURE) ×4 IMPLANT
SUT ETHILON 4 0 P 3 18 (SUTURE) ×4 IMPLANT
SUT SILK 2 0 (SUTURE)
SUT SILK 2 0 FS (SUTURE) IMPLANT
SUT SILK 2 0 SH CR/8 (SUTURE) ×4 IMPLANT
SUT SILK 2-0 18XBRD TIE 12 (SUTURE) IMPLANT
SUT SILK 3 0 REEL (SUTURE) ×8 IMPLANT
SUT SILK 4 0 (SUTURE) ×1
SUT SILK 4-0 18XBRD TIE 12 (SUTURE) ×3 IMPLANT
SUT VIC AB 3-0 FS2 27 (SUTURE) IMPLANT
SUT VIC AB 3-0 SH 18 (SUTURE) ×4 IMPLANT
SUT VIC AB 4-0 SH 27 (SUTURE)
SUT VIC AB 4-0 SH 27XBRD (SUTURE) IMPLANT
SYR BULB 3OZ (MISCELLANEOUS) ×4 IMPLANT
TOWEL OR 17X24 6PK STRL BLUE (TOWEL DISPOSABLE) ×4 IMPLANT
TRAY ENT MC OR (CUSTOM PROCEDURE TRAY) ×4 IMPLANT
TUBE CONNECTING 12X1/4 (SUCTIONS) ×4 IMPLANT
WATER STERILE IRR 1000ML POUR (IV SOLUTION) ×4 IMPLANT

## 2016-02-08 NOTE — Transfer of Care (Signed)
Immediate Anesthesia Transfer of Care Note  Patient: Tiffany Yu  Procedure(s) Performed: Procedure(s): WIDE LOCAL EXCISION OF LEFT LATERAL TONGUE (Left) LEFT SELECTIVE RADICAL NECK DISSECTION (Left)  Patient Location: PACU  Anesthesia Type:General  Level of Consciousness: lethargic and responds to stimulation  Airway & Oxygen Therapy: Patient Spontanous Breathing and Patient connected to nasal cannula oxygen  Post-op Assessment: Report given to RN  Post vital signs: Reviewed and stable  Last Vitals:  Vitals:   02/08/16 1032  BP: (!) 141/77  Pulse: (!) 111  Temp: 37.2 C    Last Pain:  Vitals:   02/08/16 1032  TempSrc: Oral         Complications: No apparent anesthesia complications

## 2016-02-08 NOTE — Anesthesia Preprocedure Evaluation (Signed)
Anesthesia Evaluation  Patient identified by MRN, date of birth, ID band Patient awake    Reviewed: Allergy & Precautions, NPO status , Patient's Chart, lab work & pertinent test results  Airway Mallampati: II  TM Distance: >3 FB     Dental  (+) Edentulous Upper, Edentulous Lower   Pulmonary    breath sounds clear to auscultation       Cardiovascular hypertension,  Rhythm:Regular Rate:Normal     Neuro/Psych    GI/Hepatic   Endo/Other    Renal/GU      Musculoskeletal   Abdominal   Peds  Hematology   Anesthesia Other Findings   Reproductive/Obstetrics                             Anesthesia Physical Anesthesia Plan  ASA: III  Anesthesia Plan: General   Post-op Pain Management:    Induction: Intravenous  Airway Management Planned: Oral ETT  Additional Equipment:   Intra-op Plan:   Post-operative Plan: Extubation in OR  Informed Consent: I have reviewed the patients History and Physical, chart, labs and discussed the procedure including the risks, benefits and alternatives for the proposed anesthesia with the patient or authorized representative who has indicated his/her understanding and acceptance.     Plan Discussed with: CRNA and Anesthesiologist  Anesthesia Plan Comments:         Anesthesia Quick Evaluation

## 2016-02-08 NOTE — Anesthesia Procedure Notes (Signed)
Procedure Name: Intubation Date/Time: 02/08/2016 12:56 PM Performed by: Neldon Newport Pre-anesthesia Checklist: Timeout performed, Patient being monitored, Suction available, Emergency Drugs available and Patient identified Patient Re-evaluated:Patient Re-evaluated prior to inductionOxygen Delivery Method: Circle system utilized Preoxygenation: Pre-oxygenation with 100% oxygen Intubation Type: IV induction and Rapid sequence Ventilation: Mask ventilation without difficulty Laryngoscope Size: Mac and 3 Grade View: Grade I Tube type: Oral Tube size: 7.0 mm Number of attempts: 1 Placement Confirmation: breath sounds checked- equal and bilateral,  positive ETCO2 and ETT inserted through vocal cords under direct vision Secured at: 22 cm Tube secured with: Tape Dental Injury: Teeth and Oropharynx as per pre-operative assessment

## 2016-02-08 NOTE — H&P (Signed)
Tiffany Yu is an 61 y.o. female.   Chief Complaint: Left tongue cancer HPI: Pt with chronic left tongue ulceration  Past Medical History:  Diagnosis Date  . Anxiety   . Arthritis   . Depression    takes ZYban daily  . History of colon polyps    benign  . Hyperlipidemia    takes Atorvastatin daily  . Hypertension    takes HCTZ daily  . Hypothyroidism    takes Synthroid daily  . Insomnia    takes Ambien nightly and Xanax as needed  . Refusal of blood transfusions as patient is Jehovah's Witness     Past Surgical History:  Procedure Laterality Date  . CHOLECYSTECTOMY    . COLONOSCOPY    . DILATION AND CURETTAGE OF UTERUS    . FEMUR IM NAIL Right 02/28/2012   Procedure: OPEN REDUCTION INTERNAL FIXATION (ORIF) RIGHT KNEE;  Surgeon: Alta Corning, MD;  Location: [facility];  Service: Orthopedics;  Laterality: Right;  . KNEE ARTHROPLASTY  12/14/2011   Procedure: COMPUTER ASSISTED TOTAL KNEE ARTHROPLASTY;  Surgeon: Alta Corning, MD;  Location: [facility];  Service: Orthopedics;  Laterality: Right;  . KNEE ARTHROSCOPY     Right  . PLANTAR FASCIA SURGERY     right foot  . TENDON RELEASE     right wrist  . TOTAL KNEE ARTHROPLASTY  12/14/2011   right knee    Family History  Problem Relation Age of Onset  . Anemia Mother   . Hypertension Mother   . Heart attack Father    Social History:  reports that she has never smoked. She has never used smokeless tobacco. She reports that she drinks alcohol. She reports that she does not use drugs.  Allergies: No Known Allergies  Medications Prior to Admission  Medication Sig Dispense Refill  . ALPRAZolam (XANAX) 0.5 MG tablet Take 0.5 mg by mouth at bedtime as needed for sleep.     Marland Kitchen atorvastatin (LIPITOR) 10 MG tablet Take 10 mg by mouth daily at 6 PM.     . buPROPion (ZYBAN) 150 MG 12 hr tablet Take 150 mg by mouth daily.    . hydrochlorothiazide (MICROZIDE) 12.5 MG capsule Take 12.5 mg by mouth daily.     Marland Kitchen levothyroxine (SYNTHROID,  LEVOTHROID) 75 MCG tablet Take 75 mcg by mouth daily before breakfast.     . naproxen sodium (ANAPROX) 220 MG tablet Take 220 mg by mouth 2 (two) times daily as needed (pain).    Marland Kitchen OVER THE COUNTER MEDICATION Take 1 tablet by mouth daily. Teptiva Probiotic Supplement    . zolpidem (AMBIEN) 10 MG tablet Take 10 mg by mouth at bedtime.     . cyclobenzaprine (FLEXERIL) 10 MG tablet Take 1 tablet (10 mg total) by mouth 2 (two) times daily as needed for muscle spasms. (Patient not taking: Reported on 02/02/2016) 20 tablet 0  . Suvorexant (BELSOMRA) 10 MG TABS Take 10 mg by mouth Nightly. (Patient not taking: Reported on 02/02/2016) 10 tablet 0  . traMADol (ULTRAM) 50 MG tablet Take 1 tablet (50 mg total) by mouth every 6 (six) hours as needed. (Patient not taking: Reported on 02/02/2016) 15 tablet 0    Results for orders placed or performed during the hospital encounter of 02/07/16 (from the past 48 hour(s))  Basic metabolic panel     Status: Abnormal   Collection Time: 02/07/16  8:36 AM  Result Value Ref Range   Sodium 139 135 - 145 mmol/L  Potassium 3.0 (L) 3.5 - 5.1 mmol/L   Chloride 99 (L) 101 - 111 mmol/L   CO2 29 22 - 32 mmol/L   Glucose, Bld 106 (H) 65 - 99 mg/dL   BUN 9 6 - 20 mg/dL   Creatinine, Ser 1.29 (H) 0.44 - 1.00 mg/dL   Calcium 9.3 8.9 - 10.3 mg/dL   GFR calc non Af Amer 43 (L) >60 mL/min   GFR calc Af Amer 50 (L) >60 mL/min    Comment: (NOTE) The eGFR has been calculated using the CKD EPI equation. This calculation has not been validated in all clinical situations. eGFR's persistently <60 mL/min signify possible Chronic Kidney Disease.    Anion gap 11 5 - 15  CBC     Status: None   Collection Time: 02/07/16  8:36 AM  Result Value Ref Range   WBC 8.4 4.0 - 10.5 K/uL   RBC 4.77 3.87 - 5.11 MIL/uL   Hemoglobin 13.7 12.0 - 15.0 g/dL   HCT 38.7 36.0 - 46.0 %   MCV 81.1 78.0 - 100.0 fL   MCH 28.7 26.0 - 34.0 pg   MCHC 35.4 30.0 - 36.0 g/dL   RDW 14.2 11.5 - 15.5 %    Platelets 230 150 - 400 K/uL  No blood products     Status: None   Collection Time: 02/07/16  8:40 AM  Result Value Ref Range   Transfuse no blood products      TRANSFUSE NO BLOOD PRODUCTS, VERIFIED BY KRISTI MOORE,RN   No results found.  Review of Systems  Constitutional: Negative.   HENT: Negative.        Chronic tongue pain and ulcer  Respiratory: Negative.   Cardiovascular: Negative.     Blood pressure (!) 141/77, pulse (!) 111, temperature 98.9 F (37.2 C), temperature source Oral, weight 101.2 kg (223 lb), SpO2 97 %. Physical Exam  Constitutional: She appears well-developed and well-nourished.  HENT:  3cm shallow ulcer Left lateral tongue  Cardiovascular: Normal rate.   Respiratory: Effort normal.  GI: Soft.  Lymphadenopathy:    She has cervical adenopathy.     Assessment/Plan Adm for Left selective neck dissection and excision of left tongue lesion.  Orestes Geiman, MD 02/08/2016, 12:03 PM

## 2016-02-08 NOTE — Brief Op Note (Signed)
02/08/2016  5:27 PM  PATIENT:  Tiffany Yu  61 y.o. female  PRE-OPERATIVE DIAGNOSIS:  SQUAMOUS CELL CARCIMONA OF LATERAL TONGUE  POST-OPERATIVE DIAGNOSIS:  squamous cell CA of lateral tongue  PROCEDURE:  Procedure(s): WIDE LOCAL EXCISION OF LEFT LATERAL TONGUE (Left) LEFT SELECTIVE RADICAL NECK DISSECTION (Left)  SURGEON:  Surgeon(s) and Role:    * Jerrell Belfast, MD - Primary  PHYSICIAN ASSISTANT: Jolene Provost, PA  ASSISTANTS: None   ANESTHESIA:   general  EBL:  Total I/O In: 1600 [I.V.:1600] Out: 200 [Blood:200]  BLOOD ADMINISTERED:none  DRAINS: (92mm) Jackson-Pratt drain(s) with closed bulb suction in the Left neck   LOCAL MEDICATIONS USED:  LIDOCAINE  and Amount: 4 ml  SPECIMEN:  Source of Specimen:  Left Tongue and Left Neck  DISPOSITION OF SPECIMEN:  PATHOLOGY  COUNTS:  YES  TOURNIQUET:  * No tourniquets in log *  DICTATION: .Other Dictation: Dictation Number [id number]  PLAN OF CARE: Admit to inpatient   PATIENT DISPOSITION:  PACU - hemodynamically stable.   Delay start of Pharmacological VTE agent (>24hrs) due to surgical blood loss or risk of bleeding: no

## 2016-02-09 ENCOUNTER — Encounter (HOSPITAL_COMMUNITY): Payer: Self-pay | Admitting: Otolaryngology

## 2016-02-09 NOTE — Progress Notes (Signed)
JP drain maintained suction to bulb, left neck incision with staples dry and intact.

## 2016-02-09 NOTE — Op Note (Signed)
NAMEMADINE, WOODRUM                ACCOUNT NO.:  [id number]  MEDICAL RECORD NO.:  [id number]  LOCATION:                                 FACILITY:  PHYSICIAN:  Early Chars. Wilburn Cornelia, M.D.DATE OF BIRTH:  Jul 17, 1955  DATE OF PROCEDURE:  02/08/2016 DATE OF DISCHARGE:                              OPERATIVE REPORT   LOCATION:  [facility] Main OR.  PREOPERATIVE DIAGNOSES: 1. Left lateral tongue squamous cell carcinoma. 2. Left superior neck lymph node mass.  POSTOPERATIVE DIAGNOSES: 1. Left lateral tongue squamous cell carcinoma. 2. Left superior neck lymph node mass.  INDICATIONS FOR SURGERY: 1. Left lateral tongue squamous cell carcinoma. 2. Left superior neck lymph node mass.  SURGICAL PROCEDURES: 1. Left hemiglossectomy with primary closure. 2. Left selective neck dissection (zones I-III).  SURGEON:  Early Chars. Wilburn Cornelia, M.D.  ASSISTANT:  Jolene Provost, PA.  ANESTHESIA:  General endotracheal.  COMPLICATIONS:  No complications.  ESTIMATED BLOOD LOSS:  200 mL.  The patient transferred from the operating room to the recovery room in stable condition.  BRIEF HISTORY:  The patient is an otherwise healthy 61 year old black female, who was referred to our office for evaluation of a painful lesion involving the left lateral tongue.  Examination in the office revealed a 2 x 4-cm ulcerated lesion involving the left lateral tongue. The patient has also had a palpable lymph node in the left superior neck.  A CT scan was obtained with contrast, which showed enhancing lymph nodes in the left superior neck consistent with possible metastatic disease.  The patient underwent local biopsy of her tongue lesion, which was positive for squamous cell carcinoma.  The patient was a nonsmoker and no other risk factors.  Given her history and findings, we discussed treatment options, I recommend the above surgical procedures.  The risks and benefits were discussed in detail with  the patient and her family and they understood and agreed with our plan for surgery, which was scheduled on elective basis at [facility].  DESCRIPTION OF PROCEDURE:  The patient was brought to the operating room on February 08, 2016, placed in supine position on the operating table. General endotracheal anesthesia was established without difficulty. When the patient was adequately anesthetized, she was positioned, and prepped and draped.  Surgical time-out was then performed to correct identification of the patient and the surgical procedure.  Surgery was begun with the left neck dissection.  The patient's skin was injected with a total of 4 mL of 1% lidocaine with 1:100,000 dilution epinephrine, which injected in subcutaneous fashion in the proposed skin incision on the left lateral neck.  She was then prepped, draped and prepared for surgery.  A curvilinear incision was then made in the left lateral neck extending from the tip of the mastoid inferiorly and anteriorly along the lateral aspect of the neck approximately 3 cm below the margin of the mandible in a pre-existing skin crease.  This was carried through the skin and underlying subcutaneous tissue using a #15 scalpel.  Bovie electrocautery was then used to divide fatty soft tissue to the level of the platysma muscle.  The platysma was divided and subplatysmal  flaps were elevated superiorly and inferiorly to expose the entire left lateral neck.  Dissection was then begun along the superior aspect of the margin of resection, dissecting along the inferior component of the parotid gland and then extending anteriorly along the left inferior mandibular border.  Care was taken at the perimandibular vascular region.  There were no abnormal lymph nodes in this area, but the soft tissue and lymph node-bearing tissue were carefully dissected. Dissection was then carried out in the left submandibular space. Submandibular  gland was identified and reflected inferiorly.  The arteries and veins in this area were divided and suture ligated.  The lingual nerve was identified and preserved, its contribution of the gland was transected.  Dissection was then carried anteriorly into the anterior component of zone I.  The submandibular duct was divided and suture ligated.  The mylohyoid muscle was elevated anteriorly and the underlying fatty tissue was resected with the specimen.  Dissection was then carried anteriorly to the level of the hyoid bone and then following the omohyoid muscle inferiorly as the inferior margin of the resection.  Soft tissue was elevated to the level of the strap muscles. Dissection was then begun posteriorly along the superficial border of the left sternocleidomastoid muscle elevating the fascia from the muscle and then extended our dissection medial and deep to the muscle into the lymph node-bearing tissues.  In the superior aspect of zone II, the spinal accessory nerve was identified.  This was preserved and traced throughout its course.  The deep underlying fatty tissue was then resected in zone IIb.  There were no abnormal lymph nodes, but this tissue was fully resected.  Dissection was then carried deep to the sternocleidomastoid muscle and inferiorly to the level of the omohyoid. Soft tissue was then reflected anteriorly and the jugular vein, carotid artery and vagus nerve were identified.  These were preserved throughout their course.  Dissection was then carried along the jugular vein with both blunt and sharp dissection elevating the lymph node-bearing tissue, there were several abnormal-appearing lymph nodes in zone II and zone III of the neck and these were carefully dissected with this specimen and sent for pathologic evaluation.  Careful dissection along the jugular vein was carried out and the contributions of the jugular vein were divided and suture ligated.  Soft tissue was  then dissected off the carotid artery, which appeared free of any disease and the entire left neck dissection specimen was removed, demarcated for pathology and sent for permanent section.  The area was then thoroughly irrigated with sterile saline.  There was no active bleeding.  A 7-mm Jackson-Pratt drain was then placed at the depth of the incision, carried out through a separate stab wound in the posterior neck and sutured with 3-0 Ethilon suture.  The entire incision was then closed in layers beginning with reapproximation of the platysma with interrupted 3-0 Vicryl sutures. Final skin closure achieved with surgical staples and bacitracin ointment was applied to the entire incision.  Attention was then turned to the intraoral lesion.  Careful examination of the oral cavity and oropharynx was performed.  The patient had a large invasive tumor along the entire left lateral tongue.  A 1-2 cm margin of normal tissue was then demarcated along the entire lateral tongue and dissection was carried out through the tongue mucosa and muscular layer along the anterior midaspect of our dissection, the tumor was extending into the deep margin of the tongue, and we extended our dissection and created  a left hemiglossectomy specimen.  It was marked and sent to Pathology for gross microscopic evaluation.  Deep margins were obtained.  These were reviewed and frozen section and found to have residual tumor.  New margins were then taken along the deep aspect of the dissection, completing the hemiglossectomy and this tissue was found to be negative for any residual tumor.  The tongue defect was then irrigated with saline.  Hemostasis was maintained with bipolar cautery and suture ligature.  The entire hemiglossectomy was then closed with interrupted 3-0 Vicryl sutures in horizontal mattressing fashion to close the entire tongue defect.  The patient's oral cavity was then irrigated and suctioned.  An  orogastric tube was passed.  The stomach contents were aspirated.  The patient was then awakened from anesthetic. She was extubated and transferred from the operating room to the recovery room in stable condition.  There were no complications and estimated blood loss was 200 mL.    ______________________________ Early Chars. Wilburn Cornelia, M.D.   ______________________________ Early Chars. Wilburn Cornelia, M.D.    DLS/MEDQ  D:  [id number]  T:  02/08/2016  Job:  [id number]

## 2016-02-09 NOTE — Anesthesia Postprocedure Evaluation (Signed)
Anesthesia Post Note  Patient: Tiffany Yu  Procedure(s) Performed: Procedure(s) (LRB): WIDE LOCAL EXCISION OF LEFT LATERAL TONGUE (Left) LEFT SELECTIVE RADICAL NECK DISSECTION (Left)  Patient location during evaluation: PACU Anesthesia Type: General Level of consciousness: awake and alert and patient cooperative Pain management: pain level controlled Vital Signs Assessment: post-procedure vital signs reviewed and stable Respiratory status: spontaneous breathing and respiratory function stable Cardiovascular status: stable Anesthetic complications: no       Last Vitals:  Vitals:   02/09/16 0245 02/09/16 0628  BP: 116/69 102/67  Pulse: 99 94  Resp: 16 16  Temp: 37.2 C 37 C    Last Pain:  Vitals:   02/09/16 0628  TempSrc: Oral  PainSc:                  Martinez S

## 2016-02-09 NOTE — Progress Notes (Signed)
   ENT Progress Note: POD #1 s/p Procedure(s): WIDE LOCAL EXCISION OF LEFT LATERAL TONGUE LEFT SELECTIVE RADICAL NECK DISSECTION   Subjective: Moderate pain  Objective: Vital signs in last 24 hours: Temp:  [98 F (36.7 C)-99.1 F (37.3 C)] 98.6 F (37 C) (02/08 0628) Pulse Rate:  [88-99] 94 (02/08 0628) Resp:  [11-16] 16 (02/08 0628) BP: (102-122)/(58-70) 102/67 (02/08 0628) SpO2:  [94 %-100 %] 96 % (02/08 0628) Weight:  [101.2 kg (223 lb)] 101.2 kg (223 lb) (02/08 0245) Weight change:  Last BM Date: 02/08/16  Intake/Output from previous day: 02/07 0701 - 02/08 0700 In: 2420 [P.O.:120; I.V.:2300] Out: 295 [Drains:95; Blood:200] Intake/Output this shift: No intake/output data recorded.  Labs:  Recent Labs  02/07/16 0836 02/08/16 1859  WBC 8.4 9.4  HGB 13.7 12.1  HCT 38.7 34.0*  PLT 230 203    Recent Labs  02/07/16 0836  NA 139  K 3.0*  CL 99*  CO2 29  GLUCOSE 106*  BUN 9  CALCIUM 9.3    Studies/Results: No results found.   PHYSICAL EXAM: Tongue inc intact, no swelling or bleeding Neck stable, no swelling JP 50cc o/n Weakness of marginal mand. nerve   Assessment/Plan: Stable  Cont po liquids as tolerated Monitor JP Pain meds as needed    Effa Yarrow 02/09/2016, 12:28 PM

## 2016-02-10 NOTE — Care Management Note (Signed)
Case Management Note  Patient Details  Name: DOROTHA SCHIFFNER MRN: [id number] Date of Birth: 10-19-1955  Subjective/Objective:                    Action/Plan:   Expected Discharge Date:                  Expected Discharge Plan:  Home/Self Care  In-House Referral:     Discharge planning Services     Post Acute Care Choice:    Choice offered to:     DME Arranged:    DME Agency:     HH Arranged:    [facility] Agency:     Status of Service:  Completed, signed off  If discussed at [employer] of Stay Meetings, dates discussed:    Additional Comments:  Marilu Favre, RN 02/10/2016, 1:57 PM

## 2016-02-10 NOTE — Progress Notes (Signed)
   ENT Progress Note: POD #2 s/p Procedure(s): WIDE LOCAL EXCISION OF LEFT LATERAL TONGUE LEFT SELECTIVE RADICAL NECK DISSECTION   Subjective: Pain stable  Objective: Vital signs in last 24 hours: Temp:  [98 F (36.7 C)-99 F (37.2 C)] 98.9 F (37.2 C) (02/09 0544) Pulse Rate:  [96-101] 96 (02/09 0544) Resp:  [16-18] 18 (02/09 0544) BP: (111-125)/(64-72) 119/72 (02/09 0544) SpO2:  [92 %-97 %] 97 % (02/09 0544) Weight change:  Last BM Date: 02/07/16  Intake/Output from previous day: 02/08 0701 - 02/09 0700 In: 1053.8 [P.O.:410; I.V.:643.8] Out: 230 [Urine:200; Drains:30] Intake/Output this shift: No intake/output data recorded.  Labs:  Recent Labs  02/08/16 1859  WBC 9.4  HGB 12.1  HCT 34.0*  PLT 203   No results for input(s): NA, K, CL, CO2, GLUCOSE, BUN, CALCIUM in the last 72 hours.  Invalid input(s): CREATININR  Studies/Results: No results found.   PHYSICAL EXAM: Inc intact, no swelling or erythema JP 30 cc Left marginal weakness OP - inc stable, no bleeding   Assessment/Plan: Stable Increase po as tol Monitor JP Plan d/c 2/10 or 2/11    Tiffany Yu 02/10/2016, 12:36 PM

## 2016-02-11 NOTE — Discharge Summary (Signed)
Physician Discharge Summary  Patient ID: Tiffany Yu MRN: [id number] DOB/AGE: [birth year] 61 y.o.  Admit date: 02/08/2016 Discharge date: 02/11/2016  Admission Diagnoses: Tongue cancer  Discharge Diagnoses:  Principal Problem:   Cancer of anterior two-thirds of tongue [facility]) Active Problems:   Tongue cancer [facility])   Discharged Condition: good  Hospital Course: 61 year old female with left anterior tongue cancer presented for surgical removal.  See operative note.  She was observed after surgery with drain in place.  She has been able to tolerate oral fluid intake well using a syringe.  Pain has been well-controlled.  On POD 3, drain output had decreased and she feels confident that her oral intake is going well.  The drain was removed and she was felt stable for discharge.  Consults: None  Significant Diagnostic Studies: None  Treatments: surgery: Left hemiglossectomy and selective neck dissection  Discharge Exam: Blood pressure 121/73, pulse 90, temperature 98.4 F (36.9 C), temperature source Axillary, resp. rate 17, height 5' 7.5" (1.715 m), weight 223 lb (101.2 kg), SpO2 96 %. General appearance: alert, cooperative and no distress Throat: Left lower lip weak.  Left anterior tongue resection site looks good with sutures in place.  Expected dysarthria. Neck: Incision clean and intact, no fluid collection.  Drain removed.  Disposition: 01-Home or Self Care  Discharge Instructions    Diet - low sodium heart healthy    Complete by:  As directed    Diet - low sodium heart healthy    Complete by:  As directed    Discharge instructions    Complete by:  As directed    Sinus Instructions: 1. Limited activity 2. Liquid and soft diet 3. May bathe and shower 4. Warm saline mouth rinse twice daily 5. Elevate Head of Bed 6. Bacitracin Ointment - apply to staples/incision twice daily   Discharge instructions    Complete by:  As directed    Apply antibiotic ointment to neck incision  twice daily.  Use half strength peroxide on gauze to dab at scabbing, if needed.  OK to allow incision to get wet, gently pat dry.  Rinse mouth with salt water (1 tsp salt in 8 oz water) after meals.  Drink plenty of fluids and add in Boost or Ensure shakes when able.   Increase activity slowly    Complete by:  As directed    Increase activity slowly    Complete by:  As directed      Allergies as of 02/11/2016   No Known Allergies     Medication List    TAKE these medications   ALPRAZolam 0.5 MG tablet Commonly known as:  XANAX Take 0.5 mg by mouth at bedtime as needed for sleep.   atorvastatin 10 MG tablet Commonly known as:  LIPITOR Take 10 mg by mouth daily at 6 PM.   buPROPion 150 MG 12 hr tablet Commonly known as:  ZYBAN Take 150 mg by mouth daily.   cyclobenzaprine 10 MG tablet Commonly known as:  FLEXERIL Take 1 tablet (10 mg total) by mouth 2 (two) times daily as needed for muscle spasms.   hydrochlorothiazide 12.5 MG capsule Commonly known as:  MICROZIDE Take 12.5 mg by mouth daily.   HYDROcodone-acetaminophen 7.5-325 mg/15 ml solution Commonly known as:  HYCET Take 10-15 mLs by mouth every 4 (four) hours as needed for moderate pain.   levothyroxine 75 MCG tablet Commonly known as:  SYNTHROID, LEVOTHROID Take 75 mcg by mouth daily before breakfast.  naproxen sodium 220 MG tablet Commonly known as:  ANAPROX Take 220 mg by mouth 2 (two) times daily as needed (pain).   OVER THE COUNTER MEDICATION Take 1 tablet by mouth daily. Teptiva Probiotic Supplement   Suvorexant 10 MG Tabs Commonly known as:  BELSOMRA Take 10 mg by mouth Nightly.   traMADol 50 MG tablet Commonly known as:  ULTRAM Take 1 tablet (50 mg total) by mouth every 6 (six) hours as needed.   zolpidem 10 MG tablet Commonly known as:  AMBIEN Take 10 mg by mouth at bedtime.      Follow-up Information    SHOEMAKER, DAVID, MD Follow up in 2 week(s).   Specialty:  Otolaryngology Contact  information: [address] [place] [address] [phone]           Signed: Melida Quitter 02/11/2016, 8:43 AM

## 2016-02-16 ENCOUNTER — Encounter: Payer: Self-pay | Admitting: Internal Medicine

## 2016-02-24 ENCOUNTER — Encounter: Payer: Self-pay | Admitting: Radiation Oncology

## 2016-02-25 ENCOUNTER — Other Ambulatory Visit: Payer: Self-pay | Admitting: *Deleted

## 2016-02-25 DIAGNOSIS — C021 Malignant neoplasm of border of tongue: Secondary | ICD-10-CM

## 2016-02-27 ENCOUNTER — Telehealth: Payer: Self-pay | Admitting: Hematology and Oncology

## 2016-02-27 ENCOUNTER — Encounter: Payer: Self-pay | Admitting: Hematology and Oncology

## 2016-02-27 ENCOUNTER — Other Ambulatory Visit: Payer: Self-pay | Admitting: *Deleted

## 2016-02-27 ENCOUNTER — Telehealth: Payer: Self-pay | Admitting: *Deleted

## 2016-02-27 DIAGNOSIS — C021 Malignant neoplasm of border of tongue: Secondary | ICD-10-CM

## 2016-02-27 NOTE — Telephone Encounter (Signed)
Pt returned my call. Appt has been scheduled for her to see Dr. Alvy Bimler on 3/7 at 12pm. Aware to arrive 30 minutes early. Voiced understanding. Letter mailed.

## 2016-02-27 NOTE — Telephone Encounter (Signed)
Oncology Nurse Navigator Documentation  Placed introductory call to new referral patient Tiffany Yu.  Introduced myself as the H&N oncology nurse navigator that works with Dr. Isidore Moos to whom she has been referred by Dr. Wilburn Cornelia.  She confirmed her understanding of referral and appt date/time of 3/2 [place] and 1300 Dr. Isidore Moos.  I briefly explained my role as her navigator, indicated that I would be joining her during her appt.  I confirmed her understanding of [place] location, explained arrival and RadOnc registration process for appt.  I confirmed her understanding of tomorrow morning's appt [facility], explained purpose of dental evaluation prior to starting RT.  I explained she will be contacted to arrange CT Neck and Chest to be completed prior to Friday's appt with Dr. Isidore Moos.  I provided my contact information, encouraged her to call with questions/concerns.    She verbalized understanding of information provided, expressed appreciation for my call.  [name], RN, BSN, [place] Oncology Nurse [place] at [place] [phone]

## 2016-02-27 NOTE — Telephone Encounter (Signed)
lft vm to schedule an appt w/Dr. Alvy Bimler on 3/7 at 12pm. Will mail the pt a letter.

## 2016-02-28 ENCOUNTER — Encounter (HOSPITAL_COMMUNITY): Payer: Self-pay | Admitting: Dentistry

## 2016-02-28 ENCOUNTER — Ambulatory Visit (HOSPITAL_COMMUNITY): Payer: Self-pay | Admitting: Dentistry

## 2016-02-28 VITALS — BP 133/74 | HR 98 | Temp 98.4°F

## 2016-02-28 DIAGNOSIS — K08109 Complete loss of teeth, unspecified cause, unspecified class: Secondary | ICD-10-CM

## 2016-02-28 DIAGNOSIS — C021 Malignant neoplasm of border of tongue: Secondary | ICD-10-CM

## 2016-02-28 DIAGNOSIS — K0820 Unspecified atrophy of edentulous alveolar ridge: Secondary | ICD-10-CM | POA: Diagnosis not present

## 2016-02-28 DIAGNOSIS — K068 Other specified disorders of gingiva and edentulous alveolar ridge: Secondary | ICD-10-CM | POA: Diagnosis not present

## 2016-02-28 DIAGNOSIS — Z972 Presence of dental prosthetic device (complete) (partial): Secondary | ICD-10-CM

## 2016-02-28 DIAGNOSIS — Z01818 Encounter for other preprocedural examination: Secondary | ICD-10-CM

## 2016-02-28 MED ORDER — CHLORHEXIDINE GLUCONATE 0.12 % MT SOLN: OROMUCOSAL | 99 refills | Status: AC

## 2016-02-28 NOTE — Patient Instructions (Signed)

## 2016-02-28 NOTE — Progress Notes (Signed)
DENTAL CONSULTATION  Date of Consultation:  02/28/2016 Patient Name:   Tiffany Yu Date of Birth:   Nov 28, 1955 Medical Record Number: [id number]  VITALS: BP 133/74 (BP Location: Left Arm)   Pulse 98   Temp 98.4 F (36.9 C) (Oral)   CHIEF COMPLAINT: Patient referred by Dr. Isidore Moos for a dental consultation.   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 Patient is status post left partial glossectomy with neck dissection by Dr. Wilburn Cornelia on 02/08/2016. The patient with anticipated radiation therapy and possible chemotherapy. Patient is now seen as part of a pre-chemoradiation therapy dental protocol examination.  The patient denies any toothaches, swellings, or abscesses. Patient is completely edentulous. Patient had 4 maxillary and 4 mandibular implants originally placed by Dr. Frederik Schmidt, oral surgeon, in approximately 2000-2001 by patient report. Patient then had the upper and lower complete implant retained dentures fabricated by Dr. Kennon Rounds in [place], [place] approximately 2 years later.  The patient has been seen for periodic recalls of the implant therapy and dentures since that time. Dr. Kennon Rounds, however, passed away and the patient has not had follow-up since approximately 2006-2007.  Patient also indicates that she lost two upper implants on the left side and one lower implant on the right side.  Patient indicates that the upper and lower complete dentures "fit okay".  Patient does use significant denture adhesive to help keep the dentures in, however. Patient denies having dental phobia.  PROBLEM LIST: Patient Active Problem List   Diagnosis Date Noted  . 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[place]) 02/08/2016  . Tongue cancer ([place]) 02/08/2016  . 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 OSA on CPAP 01/14/2014  . Snoring 05/06/2013  . Chronic allergic rhinitis 05/06/2013  . Morbid obesity ([place])  05/06/2013  . Fracture of femur, distal, right, closed ([place]) 02/29/2012  . Osteoarthritis of right knee 12/14/2011    PMH: Past Medical History:  Diagnosis Date  . Anxiety   . Arthritis   . Depression    takes ZYban daily  . History of colon polyps    benign  . Hyperlipidemia    takes Atorvastatin daily  . Hypertension    takes HCTZ daily  . Hypothyroidism    takes Synthroid daily  . Insomnia    takes Ambien nightly and Xanax as needed  . Refusal of blood transfusions as patient is Jehovah's Witness     PSH: Past Surgical History:  Procedure Laterality Date  . CHOLECYSTECTOMY    . COLONOSCOPY    . DILATION AND CURETTAGE OF UTERUS    . EXCISION OF TONGUE LESION Left 02/08/2016   Procedure: WIDE LOCAL EXCISION OF LEFT LATERAL TONGUE;  Surgeon: Jerrell Belfast, MD;  Location: [place];  Service: ENT;  Laterality: Left;  . FEMUR IM NAIL Right 02/28/2012   Procedure: OPEN REDUCTION INTERNAL FIXATION (ORIF) RIGHT KNEE;  Surgeon: Alta Corning, MD;  Location: [place];  Service: Orthopedics;  Laterality: Right;  . KNEE ARTHROPLASTY  12/14/2011   Procedure: COMPUTER ASSISTED TOTAL KNEE ARTHROPLASTY;  Surgeon: Alta Corning, MD;  Location: [place];  Service: Orthopedics;  Laterality: Right;  . KNEE ARTHROSCOPY     Right  . PLANTAR FASCIA SURGERY     right foot  . RADICAL NECK DISSECTION Left 02/08/2016   Procedure: LEFT SELECTIVE RADICAL NECK DISSECTION;  Surgeon: Jerrell Belfast, MD;  Location: [place];  Service:  ENT;  Laterality: Left;  . TENDON RELEASE     right wrist  . TOTAL KNEE ARTHROPLASTY  12/14/2011   right knee    ALLERGIES: No Known Allergies  MEDICATIONS: Current Outpatient Prescriptions  Medication Sig Dispense Refill  . ALPRAZolam (XANAX) 0.5 MG tablet Take 0.5 mg by mouth at bedtime as needed for sleep.     Marland Kitchen atorvastatin (LIPITOR) 10 MG tablet Take 10 mg by mouth daily at 6 PM.     . buPROPion (ZYBAN) 150 MG 12 hr tablet Take 150 mg by mouth daily.    Marland Kitchen  HYDROcodone-acetaminophen (HYCET) 7.5-325 mg/15 ml solution Take 10-15 mLs by mouth every 4 (four) hours as needed for moderate pain. 473 mL 0  . levothyroxine (SYNTHROID, LEVOTHROID) 75 MCG tablet Take 75 mcg by mouth daily before breakfast.     . OVER THE COUNTER MEDICATION Take 1 tablet by mouth daily. Teptiva Probiotic Supplement    . zolpidem (AMBIEN) 10 MG tablet Take 10 mg by mouth at bedtime.     . cyclobenzaprine (FLEXERIL) 10 MG tablet Take 1 tablet (10 mg total) by mouth 2 (two) times daily as needed for muscle spasms. (Patient not taking: Reported on 02/02/2016) 20 tablet 0  . hydrochlorothiazide (MICROZIDE) 12.5 MG capsule Take 12.5 mg by mouth daily.     . naproxen sodium (ANAPROX) 220 MG tablet Take 220 mg by mouth 2 (two) times daily as needed (pain).    . traMADol (ULTRAM) 50 MG tablet Take 1 tablet (50 mg total) by mouth every 6 (six) hours as needed. (Patient not taking: Reported on 02/02/2016) 15 tablet 0   No current facility-administered medications for this visit.     LABS: Lab Results  Component Value Date   WBC 9.4 02/08/2016   HGB 12.1 02/08/2016   HCT 34.0 (L) 02/08/2016   MCV 81.0 02/08/2016   PLT 203 02/08/2016      Component Value Date/Time   NA 139 02/07/2016 0836   K 3.0 (L) 02/07/2016 0836   CL 99 (L) 02/07/2016 0836   CO2 29 02/07/2016 0836   GLUCOSE 106 (H) 02/07/2016 0836   BUN 9 02/07/2016 0836   CREATININE 1.25 (H) 02/08/2016 1859   CALCIUM 9.3 02/07/2016 0836   GFRNONAA 45 (L) 02/08/2016 1859   GFRAA 52 (L) 02/08/2016 1859   Lab Results  Component Value Date   INR 1.40 03/01/2012   INR 1.17 02/29/2012   INR 1.11 02/02/2012   No results found for: PTT  SOCIAL HISTORY: Social History   Social History  . Marital status: Married    Spouse name: Ebony Hail  . Number of children: 2  . Years of education: College   Occupational History  . CUSTOMER SERVICE REP  At [employer]   Social History Main Topics  . Smoking status: Never Smoker  .  Smokeless tobacco: Never Used  . Alcohol use Yes     Comment: very rarely  . Drug use: No  . Sexual activity: Not on file   Other Topics Concern  . Not on file   Social History Narrative   Patient is married Ebony Hail) and lives at home with her husband.   Patient has a college education.   Patient drinks two sodas per day.   Patient has two children.   Patient is right-handed.          FAMILY HISTORY: Family History  Problem Relation Age of Onset  . Anemia Mother   . Hypertension Mother   .  Heart attack Father     REVIEW OF SYSTEMS: Reviewed with the patient as per history of present illness.  Psych: The patient denies having dental phobia.  DENTAL HISTORY: CHIEF COMPLAINT: Patient referred by Dr. Isidore Moos for a dental consultation.   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 Patient is status post left partial glossectomy with neck dissection by Dr. Wilburn Cornelia on 02/08/2016. The patient with anticipated radiation therapy and possible chemotherapy. Patient is now seen as part of a pre-chemoradiation therapy dental protocol examination.  The patient denies any toothaches, swellings, or abscesses. Patient is completely edentulous. Patient had 4 maxillary and 4 mandibular implants originally placed by Dr. Frederik Schmidt, oral surgeon, in approximately 2000-2001 by patient report. Patient then had the upper and lower complete implant retained dentures fabricated by Dr. Kennon Rounds in [place], [place] approximately 2 years later.  The patient has been seen for periodic recalls of the implant therapy and dentures since that time. Dr. Kennon Rounds, however, passed away and the patient has not had follow-up since approximately 2006-2007.  Patient also indicates that she lost two upper implants on the left side and one lower implant on the right side.  Patient indicates that the upper and lower complete dentures "fit  okay".  Patient does use significant denture adhesive to help keep the dentures in, however. Patient denies having dental phobia.   DENTAL EXAMINATION: GENERAL:The patient is a well-developed, well-nourished female in no acute distress. HEAD AND NECK:  The left neck is consistent with previous neck dissection. There is no right neck lymphadenopathy. The patient deviates from left-to-right on maximum opening.  Patient indicates that she has left facial numbness and muscle weakness since the surgery.  INTRAORAL EXAM: Patient has normal saliva. The left lateral tongue is consistent with partial glossectomy. The patient has and epulis fissuratum involving the maxillary anterior facial vestibule.  Patient also has flabby tissues associated with the maxilla and left alveolar ridge area #12 through 14.  DENTITION:  Patient is edentulous. The patient has implants in the area of tooth numbers 2, 6, 18, 22, and 27. Patient has lost implants in the area of tooth numbers 11, 15, and 31. PERIODONTAL:  Patient has poor oral hygiene around remaining implants. No periodontal charting was performed secondary to questionable need for antibiotic premedication due to previous total knee replacement. There does not appear to be any mobility of the remaining implants although bone loss is noted around the implants. PROSTHODONTIC:  Patient has upper complete and lower complete implant retained dentures. She has not been wearing lower complete implant retained denture since her tongue surgery. I am unable to assess the clinical acceptability of these dentures at this time due.  OCCLUSION: I am unable to assess the occlusion of the dentures at this time.   RADIOGRAPHIC INTERPRETATION: An orthopantogram was taken today. Patient is completely edentulous. There is atrophy of the edentulous alveolar ridges. There are implants in the area of tooth numbers 2, 6, 18, 22, and 27. Various degrees of bone loss are noted around the  implants. No periapical radiolucencies are noted around the implants.   ASSESSMENTS: 1. 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 Status post left partial glossectomy and neck dissection 3. Pre-chemotherapy ration therapy dental protocol examination 4. Patient is completely edentulous 5. There is atrophy of the edentulous alveolar ridges 6. There are implants in the area of tooth numbers 2, 6,  18, 22, and 27 7. There is bone loss around these implants 8. 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 Flabby tissues associated with the premaxilla and maxillary left alveolar ridge area #12 through 14 10. Poor oral hygiene and accretions noted around remaining implants  11. Questionable need for antibiotic premedication prior to invasive dental procedures due to previous total knee replacement.  PLAN/RECOMMENDATIONS: 1. I discussed the risks, benefits, and complications of various treatment options with the patient in relationship to her medical and dental conditions, anticipated radiation therapy and possible chemotherapy, and side effects to include xerostomia, trismus, mucositis, taste changes, gum and jawbone changes, and risk for infection and osteoradionecrosis. We discussed various treatment options to include no treatment, referral to Dr. Benson Norway for evaluation of the implants status, need for future evaluation of upper and lower complete implant retained dentures ideally by a prosthodontist, periodontal therapy, and future implant  and prosthodontic therapy. The patient currently wishes to defer all dental treatment at this time but does agree to proceed with use of chlorhexidine gluconate rinses 3 times daily in a swish and spit manner. The patient will also maintain better oral hygiene around the implants still present in her mouth.  2. Discussion of findings with medical team and coordination of future medical and dental care as needed.  I spent in excess of  90 minutes during the conduct of this consultation and >50% of this time involved direct face-to-face encounter for counseling and/or coordination of the patient's care.    Lenn Cal, DDS

## 2016-02-28 NOTE — Telephone Encounter (Signed)
New patient appointment time on 3/7 adjusted to 1pm. Spoke with patient she is aware and will arrive by 12:30pm.

## 2016-02-29 ENCOUNTER — Other Ambulatory Visit: Payer: Self-pay | Admitting: Hematology and Oncology

## 2016-02-29 NOTE — Progress Notes (Signed)
START ON PATHWAY REGIMEN - Head and Neck     Administer weekly:     Cisplatin        Dose Mod: None  **Always confirm dose/schedule in your pharmacy ordering system**    Patient Characteristics: Oral Cavity, Stage III, IVA; Resectable, Surgery Followed by Radiation/Postoperative Therapy, ENE or Positive Margins Disease Classification: Oral Cavity AJCC T Category: T2 AJCC M Category: M0 AJCC N Category: pN2 AJCC 8 Stage Grouping: IVA Current Disease Status: No Distant Mets or Local Recurrence  Intent of Therapy: Curative Intent, Discussed with Patient

## 2016-03-01 ENCOUNTER — Telehealth: Payer: Self-pay | Admitting: *Deleted

## 2016-03-01 NOTE — Telephone Encounter (Signed)
Oncology Nurse Navigator Documentation  Spoke with Tiffany Yu, informed her of new appts:  3/6 0730 Nurse Eval, 0800 Dr. Isidore Moos, 0900 CT SIM.  She voiced understanding.  Tiffany Orem, RN, BSN, [place] [facility] Oncology Nurse [place] at [place] [phone]

## 2016-03-01 NOTE — Progress Notes (Signed)
Head and Neck Cancer Location of Tumor / Histology:  Left Lateral Tongue and Level 3 Lymph node positive for Squamous Cell Carcinoma  Patient presented with symptoms of:  A one-month history of increasing ulcerated lesion along the left lateral tongue with associated tongue pain referred pain to the neck and left ear to Dr. Wilburn Cornelia on 12/30/15.    Biopsies of  revealed:  02/08/16 Diagnosis 1. 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 Tongue, biopsy, Anterior deep margin left SQUAMOUS CELL CARCINOMA 3. Tongue, biopsy, Posterior deep margin left NEGATIVE FOR CARCINOMA 4. Tongue, biopsy, Posterior margin left SQUAMOUS CELL CARCINOMA 5. Tongue, excisional biopsy, New anterior deep margin NEGATIVE FOR CARCINOMA 6. Tongue, excisional biopsy, New posterior deep margin SMALL FOCUS OF SQUAMOUS CELL CARCINOMA (0.1 CM, ONLY PRESENT AT THE DEEPER PERMINENT SECTION) 7. Tongue, resection for tumor, Left lateral INFILTRATIVE KERATINIZED SQUAMOUS CELL CARCINOMA (3.2 CM) THE TUMOR INVADES UNDERNEATH MUSCLE OF THE TONGUE (2.0 CM, PT2) LYMPHOVASCULAR AND PERINEURAL INVASION IDENTIFIED SQUAMOUS CELL CARCINOMA PRESENTED AT (FINAL MARGINS REFER TO PART 2-6) LATERAL INKED MARGIN (1.0 CM) MEDIAL INKED MARGIN (0.6 CM)  Nutrition Status Yes No Comments  Weight changes? [x]  []  She reports losing 20+ lbs since November  Swallowing concerns? [x]  []  She is eating softer foods. She is not able to chew any foods. She is drinking nutritional supplements.   PEG? []  [x]     Referrals Yes No Comments  Social Work? []  [x]    Dentistry? [x]  []  02/28/16 Dr. Enrique Sack-- The patient currently wishes to defer all dental treatment at this time but does agree to proceed with use of chlorhexidine  gluconate rinses 3 times daily in a swish and spit manner. The patient will also maintain better oral hygiene around the implants still present in her mouth.  Swallowing therapy? []  [x]    Nutrition? []  [x]    Med/Onc? [x]  []  Dr. Alvy Bimler 03/07/16   Safety Issues Yes No Comments  Prior radiation? []  [x]    Pacemaker/ICD? []  [x]    Possible current pregnancy? []  [x]    Is the patient on methotrexate? []  [x]     Tobacco/Marijuana/Snuff/ETOH use: Never a smoker or smokeless tobacco use. Rare alcohol use.   Past/Anticipated interventions by otolaryngology, if any:  02/08/16 PROCEDURE:  Procedure(s): WIDE LOCAL EXCISION OF LEFT LATERAL TONGUE (Left) LEFT SELECTIVE RADICAL NECK DISSECTION (Left)  SURGEON:  Surgeon(s) and Role:    * Jerrell Belfast, MD - Primary  Past/Anticipated interventions by medical oncology, if any: Dr. Alvy Bimler 03/07/16   Current Complaints / other details:   CT soft tissue of neck 01/05/16 IMPRESSION: Left lateral tongue mass consistent with neoplasm. 2 pathologic nodes in the left neck compatible with metastatic disease.  Ct Chest/ Soft Tissue neck 03/02/16  BP (!) 140/93   Pulse (!) 101   Temp 98.3 F (36.8 C)   Ht 5' 7.5" (1.715 m)   Wt 219 lb 9.6 oz (99.6 kg)   SpO2 97% Comment: room air  BMI 33.89 kg/m    Wt Readings from Last 3 Encounters:  03/06/16 219 lb 9.6 oz (99.6 kg)  02/09/16 223 lb (101.2 kg)  02/07/16 223 lb 3.2 oz (101.2 kg)

## 2016-03-02 ENCOUNTER — Ambulatory Visit
Admission: RE | Admit: 2016-03-02 | Discharge: 2016-03-02 | Disposition: A | Discharge status: Home / Self Care | Payer: 59 | Source: Ambulatory Visit | Attending: Radiation Oncology | Admitting: Radiation Oncology

## 2016-03-02 ENCOUNTER — Ambulatory Visit (HOSPITAL_COMMUNITY)
Admission: RE | Admit: 2016-03-02 | Discharge: 2016-03-02 | Disposition: A | Discharge status: Home / Self Care | Payer: 59 | Source: Ambulatory Visit | Attending: Radiation Oncology | Admitting: Radiation Oncology

## 2016-03-02 ENCOUNTER — Ambulatory Visit: Payer: 59

## 2016-03-02 DIAGNOSIS — E785 Hyperlipidemia, unspecified: Secondary | ICD-10-CM | POA: Insufficient documentation

## 2016-03-02 DIAGNOSIS — Z86010 Personal history of colonic polyps: Secondary | ICD-10-CM | POA: Insufficient documentation

## 2016-03-02 DIAGNOSIS — Z510 Encounter for antineoplastic radiation therapy: Secondary | ICD-10-CM | POA: Insufficient documentation

## 2016-03-02 DIAGNOSIS — C023 Malignant neoplasm of anterior two-thirds of tongue, part unspecified: Secondary | ICD-10-CM | POA: Insufficient documentation

## 2016-03-02 DIAGNOSIS — Z9889 Other specified postprocedural states: Secondary | ICD-10-CM | POA: Insufficient documentation

## 2016-03-02 DIAGNOSIS — G4700 Insomnia, unspecified: Secondary | ICD-10-CM | POA: Insufficient documentation

## 2016-03-02 DIAGNOSIS — C021 Malignant neoplasm of border of tongue: Secondary | ICD-10-CM | POA: Diagnosis not present

## 2016-03-02 DIAGNOSIS — I10 Essential (primary) hypertension: Secondary | ICD-10-CM | POA: Insufficient documentation

## 2016-03-02 DIAGNOSIS — Z79899 Other long term (current) drug therapy: Secondary | ICD-10-CM | POA: Insufficient documentation

## 2016-03-02 DIAGNOSIS — I700 Atherosclerosis of aorta: Secondary | ICD-10-CM | POA: Insufficient documentation

## 2016-03-02 DIAGNOSIS — R590 Localized enlarged lymph nodes: Secondary | ICD-10-CM | POA: Diagnosis not present

## 2016-03-02 DIAGNOSIS — R1310 Dysphagia, unspecified: Secondary | ICD-10-CM | POA: Insufficient documentation

## 2016-03-02 DIAGNOSIS — E039 Hypothyroidism, unspecified: Secondary | ICD-10-CM | POA: Insufficient documentation

## 2016-03-02 DIAGNOSIS — F329 Major depressive disorder, single episode, unspecified: Secondary | ICD-10-CM | POA: Insufficient documentation

## 2016-03-02 DIAGNOSIS — F419 Anxiety disorder, unspecified: Secondary | ICD-10-CM | POA: Insufficient documentation

## 2016-03-02 LAB — COMPREHENSIVE METABOLIC PANEL
ALK PHOS: 109 U/L (ref 40–150)
ALT: 12 U/L (ref 0–55)
ANION GAP: 9 meq/L (ref 3–11)
AST: 23 U/L (ref 5–34)
Albumin: 3.7 g/dL (ref 3.5–5.0)
BUN: 14.2 mg/dL (ref 7.0–26.0)
CALCIUM: 9.8 mg/dL (ref 8.4–10.4)
CHLORIDE: 105 meq/L (ref 98–109)
CO2: 26 mEq/L (ref 22–29)
CREATININE: 1 mg/dL (ref 0.6–1.1)
EGFR: 69 mL/min/{1.73_m2} — ABNORMAL LOW (ref >90)
Glucose: 96 mg/dl (ref 70–140)
Potassium: 4.4 mEq/L (ref 3.5–5.1)
Sodium: 140 mEq/L (ref 136–145)
Total Bilirubin: 0.82 mg/dL (ref 0.20–1.20)
Total Protein: 7.8 g/dL (ref 6.4–8.3)

## 2016-03-02 LAB — CBC WITH DIFFERENTIAL/PLATELET
BASO%: 1 % (ref 0.0–2.0)
Basophils Absolute: 0.1 10*3/uL (ref 0.0–0.1)
EOS ABS: 0.6 10*3/uL — AB (ref 0.0–0.5)
EOS%: 7.6 % — AB (ref 0.0–7.0)
HCT: 37.9 % (ref 34.8–46.6)
HEMOGLOBIN: 13.2 g/dL (ref 11.6–15.9)
LYMPH%: 21.9 % (ref 14.0–49.7)
MCH: 28.9 pg (ref 25.1–34.0)
MCHC: 34.9 g/dL (ref 31.5–36.0)
MCV: 82.8 fL (ref 79.5–101.0)
MONO#: 0.6 10*3/uL (ref 0.1–0.9)
MONO%: 7.4 % (ref 0.0–14.0)
NEUT%: 62.1 % (ref 38.4–76.8)
NEUTROS ABS: 5.3 10*3/uL (ref 1.5–6.5)
Platelets: 271 10*3/uL (ref 145–400)
RBC: 4.58 10*6/uL (ref 3.70–5.45)
RDW: 15.3 % — AB (ref 11.2–14.5)
WBC: 8.5 10*3/uL (ref 3.9–10.3)
lymph#: 1.9 10*3/uL (ref 0.9–3.3)

## 2016-03-02 MED ORDER — IOPAMIDOL (ISOVUE-300) INJECTION 61%
INTRAVENOUS | Status: AC
  Filled 2016-03-02: qty 75

## 2016-03-02 MED ORDER — IOPAMIDOL (ISOVUE-300) INJECTION 61%
100.0000 mL | Freq: Once | INTRAVENOUS | Status: AC | PRN
  Administered 2016-03-02: 75 mL via INTRAVENOUS

## 2016-03-05 ENCOUNTER — Other Ambulatory Visit: Payer: Self-pay | Admitting: Radiation Oncology

## 2016-03-05 ENCOUNTER — Other Ambulatory Visit: Payer: Self-pay | Admitting: *Deleted

## 2016-03-05 DIAGNOSIS — E038 Other specified hypothyroidism: Secondary | ICD-10-CM

## 2016-03-05 DIAGNOSIS — C023 Malignant neoplasm of anterior two-thirds of tongue, part unspecified: Secondary | ICD-10-CM

## 2016-03-05 LAB — TSH: TSH: 6.595 m[IU]/L — AB (ref 0.308–3.960)

## 2016-03-05 NOTE — Progress Notes (Signed)
Has armband been applied?  No  Does patient have an allergy to IV contrast dye?: NKA   Has patient ever received premedication for IV contrast dye?:  unknown  Does patient take metformin?: no  If patient does take metformin when was the last dose: na Date of lab work: 03/02/16 BUN: 14.2 CR: 1.0  IV site: Left Hand  Has IV site been added to flowsheet?  Yes

## 2016-03-06 ENCOUNTER — Ambulatory Visit
Admission: RE | Admit: 2016-03-06 | Discharge: 2016-03-06 | Disposition: A | Discharge status: Home / Self Care | Payer: 59 | Source: Ambulatory Visit | Attending: Radiation Oncology | Admitting: Radiation Oncology

## 2016-03-06 ENCOUNTER — Encounter: Payer: Self-pay | Admitting: Radiation Oncology

## 2016-03-06 ENCOUNTER — Encounter: Payer: Self-pay | Admitting: *Deleted

## 2016-03-06 VITALS — BP 140/93 | HR 101 | Temp 98.3°F | Ht 67.5 in | Wt 219.6 lb

## 2016-03-06 DIAGNOSIS — C021 Malignant neoplasm of border of tongue: Secondary | ICD-10-CM

## 2016-03-06 DIAGNOSIS — I10 Essential (primary) hypertension: Secondary | ICD-10-CM | POA: Diagnosis not present

## 2016-03-06 DIAGNOSIS — C023 Malignant neoplasm of anterior two-thirds of tongue, part unspecified: Secondary | ICD-10-CM

## 2016-03-06 DIAGNOSIS — G4700 Insomnia, unspecified: Secondary | ICD-10-CM | POA: Diagnosis not present

## 2016-03-06 DIAGNOSIS — Z79899 Other long term (current) drug therapy: Secondary | ICD-10-CM | POA: Diagnosis not present

## 2016-03-06 DIAGNOSIS — F329 Major depressive disorder, single episode, unspecified: Secondary | ICD-10-CM | POA: Diagnosis not present

## 2016-03-06 DIAGNOSIS — E785 Hyperlipidemia, unspecified: Secondary | ICD-10-CM | POA: Diagnosis not present

## 2016-03-06 DIAGNOSIS — F419 Anxiety disorder, unspecified: Secondary | ICD-10-CM | POA: Diagnosis not present

## 2016-03-06 DIAGNOSIS — Z510 Encounter for antineoplastic radiation therapy: Secondary | ICD-10-CM | POA: Diagnosis present

## 2016-03-06 DIAGNOSIS — E039 Hypothyroidism, unspecified: Secondary | ICD-10-CM | POA: Diagnosis not present

## 2016-03-06 DIAGNOSIS — R1310 Dysphagia, unspecified: Secondary | ICD-10-CM | POA: Diagnosis not present

## 2016-03-06 DIAGNOSIS — Z86010 Personal history of colonic polyps: Secondary | ICD-10-CM | POA: Diagnosis not present

## 2016-03-06 LAB — T4, FREE: FREE T4: 0.82 ng/dL (ref 0.82–1.77)

## 2016-03-06 MED ORDER — SODIUM CHLORIDE 0.9 % IJ SOLN
10.0000 mL | Freq: Once | INTRAMUSCULAR | Status: AC
  Administered 2016-03-06: 10 mL via INTRAVENOUS

## 2016-03-06 NOTE — Progress Notes (Signed)
Head and Neck Cancer Simulation, IMRT treatment planning, and Special treatment procedure note   Outpatient  Diagnosis:    ICD-9-CM ICD-10-CM   1. 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HCC) 141.4 C02.3     The patient was taken to the CT simulator and laid in the supine position on the table. An Aquaplast head and shoulder mask was custom fitted to the patient's anatomy. High-resolution CT axial imaging was obtained of the head and neck with contrast. I verified that the quality of the imaging is good for treatment planning. 1 Medically Necessary Treatment Device was fabricated and supervised by me: Aquaplast mask.   Treatment planning note I plan to treat the patient with IMRT. I plan to treat the patient's tumor and bilateral neck nodes. I plan to treat to a total dose of 66 Gray in 33  fractions. Dose calculation was ordered from dosimetry.  IMRT planning Note  IMRT is medically necessary and an important modality to deliver adequate dose to the patient's at risk tissues while sparing the patient's normal structures, including the: esophagus, parotid tissue, mandible, brain stem, spinal cord, oral cavity, brachial plexus.  This justifies the use of IMRT in the patient's treatment.   Special Treatment Procedure Note:  The patient will be receiving chemotherapy concurrently. Chemotherapy heightens the risk of side effects. I have considered this during the patient's treatment planning process and will monitor the patient accordingly for side effects on a weekly basis. Concurrent chemotherapy increases the complexity of this patient's treatment and therefore this constitutes a special treatment procedure.  -----------------------------------  Eppie Gibson, MD

## 2016-03-06 NOTE — Progress Notes (Addendum)
Oncology Nurse Navigator Documentation  Met with Tiffany Yu during initial consult with Dr. Isidore Moos.  She was accompanied by her husband.    1. Further introduced myself as her Navigator, explained my role as a member of the Care Team.   2. Provided New Patient Information packet, discussed contents:  Contact information for physician(s), myself, other members of the Care Team.  Advance Directive information ([place] blue pamphlet with LCSW contact info)  Fall Prevention Patient Safety Plan  Appointment [place] sheet  [facility] [place] [facility] map with highlight of [place] 3. Provided introductory explanation of radiation treatment including SIM planning and purpose of Aquaplast head and shoulder mask, showed them example.   4. Provided and discussed education handout for PEG. 5. Of Note:  She is going to see Dr. Constance Holster following CT SIM to address Dr. Pearlie Oyster concerns of lateral tongue nodules.  During this visit, she will schedule baseline audiology.   She was encouraged to take pills with pudding/applesauce to facilitate swallowing.  We discussed her attendance at next Tuesday morning's [place], she was given an 0800 arrival. 6. Escorted her to [facility] after which provided a tour of treatment area, explained treatment and arrival procedures. 7. I encouraged them to contact me with questions/concerns as treatments/procedures begin.  They verbalized understanding of information provided.    [name], RN, BSN, [place] [facility] Nurse [place] at [place] [phone]

## 2016-03-07 ENCOUNTER — Telehealth: Payer: Self-pay | Admitting: Hematology and Oncology

## 2016-03-07 ENCOUNTER — Ambulatory Visit: Payer: 59

## 2016-03-07 ENCOUNTER — Ambulatory Visit: Payer: 59 | Admitting: Radiation Oncology

## 2016-03-07 ENCOUNTER — Encounter: Payer: Self-pay | Admitting: *Deleted

## 2016-03-07 ENCOUNTER — Ambulatory Visit (HOSPITAL_BASED_OUTPATIENT_CLINIC_OR_DEPARTMENT_OTHER): Payer: 59 | Admitting: Hematology and Oncology

## 2016-03-07 ENCOUNTER — Encounter: Payer: Self-pay | Admitting: Hematology and Oncology

## 2016-03-07 VITALS — BP 145/85 | HR 111 | Temp 98.1°F | Resp 18 | Ht 67.5 in | Wt 221.1 lb

## 2016-03-07 DIAGNOSIS — G893 Neoplasm related pain (acute) (chronic): Secondary | ICD-10-CM | POA: Diagnosis not present

## 2016-03-07 DIAGNOSIS — R471 Dysarthria and anarthria: Secondary | ICD-10-CM

## 2016-03-07 DIAGNOSIS — C770 Secondary and unspecified malignant neoplasm of lymph nodes of head, face and neck: Secondary | ICD-10-CM | POA: Diagnosis not present

## 2016-03-07 DIAGNOSIS — C023 Malignant neoplasm of anterior two-thirds of tongue, part unspecified: Secondary | ICD-10-CM | POA: Diagnosis not present

## 2016-03-07 DIAGNOSIS — R634 Abnormal weight loss: Secondary | ICD-10-CM

## 2016-03-07 DIAGNOSIS — Z7189 Other specified counseling: Secondary | ICD-10-CM

## 2016-03-07 MED ORDER — PROCHLORPERAZINE MALEATE 10 MG PO TABS: 10.0000 mg | ORAL_TABLET | Freq: Four times a day (QID) | ORAL | 1 refills | Status: DC | PRN

## 2016-03-07 MED ORDER — FENTANYL 25 MCG/HR TD PT72: 25.0000 ug | MEDICATED_PATCH | TRANSDERMAL | 0 refills | Status: DC

## 2016-03-07 MED ORDER — LIDOCAINE-PRILOCAINE 2.5-2.5 % EX CREA: TOPICAL_CREAM | CUTANEOUS | 3 refills | Status: DC

## 2016-03-07 MED ORDER — MORPHINE SULFATE (CONCENTRATE) 10 MG /0.5 ML PO SOLN: 10.0000 mg | ORAL | 0 refills | Status: DC | PRN

## 2016-03-07 MED ORDER — ONDANSETRON HCL 8 MG PO TABS: 8.0000 mg | ORAL_TABLET | Freq: Two times a day (BID) | ORAL | 1 refills | Status: DC | PRN

## 2016-03-07 MED FILL — ONDANSETRON HCL 8 MG TABLET: 8 | 15 days supply | Qty: 30 | Fill #0

## 2016-03-07 MED FILL — LIDOCAINE-PRILOCAINE CREAM: 2.5-2.5 | 10 days supply | Qty: 30 | Fill #0

## 2016-03-07 MED FILL — PROCHLORPERAZINE 10 MG TAB: 10 | 7 days supply | Qty: 30 | Fill #0

## 2016-03-07 NOTE — Progress Notes (Signed)
Oncology Nurse Navigator Documentation  Met with Ms. Risko during initial consult with Dr. Alvy Bimler.  She was accompanied by her husband, dtr and niece. 1. Provided and discussed educational handout for PAC.  They understand PAC placement will be coordinated with PEG placement. 2. They voiced understanding of Dr. Calton Dach discussion of chemotherapy options, her recommendation for weekly low dose Cisplatin. 3. Assisted with post-consult appt scheduling. 4. They verbalized understanding of information provided. I encouraged them to call with questions/concerns, they verbalized understanding.  [name], RN, BSN, [place] [facility] Nurse [place] [facility] [place] [phone]

## 2016-03-07 NOTE — Telephone Encounter (Signed)
Gave patient avs report and appointments for March and April, including port placement.

## 2016-03-07 NOTE — Progress Notes (Signed)
Radiation Oncology         [phone] ________________________________  Initial outpatient Consultation  Name: Tiffany Yu MRN: [id number]  Date: 03/06/2016  DOB: 1955-05-25  JK:KXFGHW,EXHBZ E, MD  Jerrell Belfast, MD   REFERRING PHYSICIAN: Jerrell Belfast, MD  DIAGNOSIS:    ICD-9-CM ICD-10-CM   1. 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 female who presented with a 1 month history of a worsening ulcerated left tongue lesion and pain. She saw Dr. Wilburn Cornelia at the end of December 2017. She utimately underwent a left neck dissection and resection of tumor of the left lateral tongue on 02/08/16. This revealed pathology as described above in the diagnosis. Pathology and imaging were discussed this morning in tumor board. CT of the neck with contrast on 01/05/16 showed a 3.8 x 1.4 cm left lateral tongue lesion and 2 pathologic neck nodes. Prior to consultation on 03/02/16, she underwent post-op CT of the chest and neck with contrast. These revealed no evidence of metastatic disease in her chest, the neck was notable for a newly enlarged 10 mm right level IB lymph node. There is soft tissue thickening along the left lateral tongue resection margin.  She reports feeling new nodules along the left lateral tongue for the past few  weeks. She has lost 20+ lbs since November. She reports dysphagia, but is still eating softer foods. She has been cleared by Dr. Enrique Sack of dentistry. She sees med/onc tomorrow. She denies significant history of alcohol use, smoking, or tobacco use.  PREVIOUS RADIATION THERAPY: No  PAST MEDICAL HISTORY:  has a past medical history of Anxiety; Arthritis; Depression; History of colon polyps; Hyperlipidemia; Hypertension; Hypothyroidism; Insomnia; and Refusal of blood transfusions as patient is Jehovah's Witness.    PAST SURGICAL HISTORY: Past Surgical History:  Procedure Laterality Date  . CHOLECYSTECTOMY    . COLONOSCOPY    . DILATION AND CURETTAGE OF UTERUS    . EXCISION OF TONGUE LESION Left 02/08/2016   Procedure: WIDE LOCAL EXCISION OF LEFT LATERAL TONGUE;  Surgeon: Jerrell Belfast, MD;  Location: [facility];  Service: ENT;  Laterality: Left;  . FEMUR IM NAIL Right 02/28/2012   Procedure: OPEN REDUCTION INTERNAL FIXATION (ORIF) RIGHT KNEE;  Surgeon: Alta Corning, MD;  Location: [facility];  Service: Orthopedics;  Laterality: Right;  . KNEE ARTHROPLASTY  12/14/2011   Procedure: COMPUTER ASSISTED TOTAL KNEE ARTHROPLASTY;  Surgeon: Alta Corning, MD;  Location: [facility];  Service: Orthopedics;  Laterality: Right;  . KNEE ARTHROSCOPY     Right  . PLANTAR FASCIA SURGERY     right foot  . RADICAL NECK DISSECTION Left 02/08/2016   Procedure: LEFT SELECTIVE RADICAL NECK DISSECTION;  Surgeon: Jerrell Belfast, MD;  Location: [facility];  Service: ENT;  Laterality: Left;  . TENDON RELEASE     right wrist  . TOTAL KNEE ARTHROPLASTY  12/14/2011   right knee    FAMILY HISTORY: family history includes Anemia in her mother; Heart attack in her father; Hypertension in her mother.  SOCIAL HISTORY:  reports that she has never smoked. She has never used smokeless tobacco. She reports that she drinks alcohol. She reports that she does not use drugs.  ALLERGIES: Patient has no known allergies.  MEDICATIONS:  Current  Outpatient Prescriptions  Medication Sig Dispense Refill  . ALPRAZolam (XANAX) 0.5 MG tablet Take 0.5 mg by mouth at bedtime as needed for sleep.     Marland Kitchen atorvastatin (LIPITOR) 10 MG tablet Take 10 mg by mouth daily at 6 PM.     . buPROPion (ZYBAN) 150 MG 12 hr tablet Take 150 mg by mouth daily.    . chlorhexidine (PERIDEX) 0.12 % solution Rinse with 15 mls three times daily for 30 seconds. Use after breakfast, dinner, and at bedtime. Spit out excess. Do not swallow. 480 mL prn  . HYDROcodone-acetaminophen (HYCET) 7.5-325 mg/15 ml solution Take 10-15 mLs by mouth every 4 (four) hours as needed for moderate pain. 473 mL 0  . levothyroxine (SYNTHROID, LEVOTHROID) 75 MCG tablet Take 75 mcg by mouth daily before breakfast.     . triamcinolone (KENALOG) 0.1 % paste APPLY TO AFFECTED AREA    . zolpidem (AMBIEN) 10 MG tablet Take 10 mg by mouth at bedtime.     . cyclobenzaprine (FLEXERIL) 10 MG tablet Take 1 tablet (10 mg total) by mouth 2 (two) times daily as needed for muscle spasms. (Patient not taking: Reported on 02/02/2016) 20 tablet 0  . hydrochlorothiazide (MICROZIDE) 12.5 MG capsule Take 12.5 mg by mouth daily.     . naproxen sodium (ANAPROX) 220 MG tablet Take 220 mg by mouth 2 (two) times daily as needed (pain).    Marland Kitchen OVER THE COUNTER MEDICATION Take 1 tablet by mouth daily. Teptiva Probiotic Supplement    . traMADol (ULTRAM) 50 MG tablet Take 1 tablet (50 mg total) by mouth every 6 (six) hours as needed. (Patient not taking: Reported on 02/02/2016) 15 tablet 0   No current facility-administered medications for this encounter.     REVIEW OF SYSTEMS:  Notable for that above.   PHYSICAL EXAM:  height is 5' 7.5" (1.715 m) and weight is 219 lb 9.6 oz (99.6 kg). Her temperature is 98.3 F (36.8 C). Her blood pressure is 140/93 (abnormal) and her pulse is 101 (abnormal). Her oxygen saturation is 97%.   General: Alert and oriented, in no acute distress HEENT: Head is normocephalic. Extraocular  movements are intact. Oropharynx is notable for no lesions, however, she is status post surgical resection of the left lateral tongue. There are at least 4 nodules along the left lateral tongue that may be stitch granulomas, but I will have otolaryngology take a look later today. Neck: Neck is notable for  for post operative swelling extending to the left face. Surgical scars have healed well. No palpable masses. Heart: Regular in rate and rhythm with no murmurs, rubs, or gallops. Chest: Clear to auscultation bilaterally, with no rhonchi, wheezes, or rales. Abdomen: Soft, nontender, nondistended, with no rigidity or guarding. Extremities: No upper extremity edema. Lymphatics: see Neck Exam Skin: No concerning lesions. Musculoskeletal: symmetric strength and muscle tone throughout. Neurologic: Cranial nerves II through XII are grossly intact. No obvious focalities. Speech is fluent.  Coordination is intact. Psychiatric: Judgment and insight are intact. Affect is appropriate.  ECOG = 1  0 - Asymptomatic (Fully active, able to carry on all predisease activities without restriction)  1 - Symptomatic but completely ambulatory (Restricted in physically strenuous activity but ambulatory and able to carry out work of a light or sedentary nature. For example, light housework, office work)  2 - Symptomatic, <50% in bed during the day (Ambulatory and capable of all self care but unable to carry out any work activities. Up and about more than 50% of waking hours)  3 - Symptomatic, >50% in bed, but not bedbound (Capable of only limited self-care, confined to bed or chair 50% or more of waking hours)  4 - Bedbound (Completely disabled. Cannot carry on any self-care. Totally confined to bed or chair)  5 - Death   Eustace Pen MM, Creech RH, Tormey DC, et al. [phone]). "Toxicity and response criteria of the [facility] Group". [place] Oncol. 5 (6): 649-55   LABORATORY DATA:  Lab Results    Component Value Date   WBC 8.5 03/02/2016   HGB 13.2 03/02/2016   HCT 37.9 03/02/2016   MCV 82.8 03/02/2016   PLT 271 03/02/2016   CMP     Component Value Date/Time   NA 140 03/02/2016 1433   K 4.4 03/02/2016 1433   CL 99 (L) 02/07/2016 0836   CO2 26 03/02/2016 1433   GLUCOSE 96 03/02/2016 1433   BUN 14.2 03/02/2016 1433   CREATININE 1.0 03/02/2016 1433   CALCIUM 9.8 03/02/2016 1433   PROT 7.8 03/02/2016 1433   ALBUMIN 3.7 03/02/2016 1433   AST 23 03/02/2016 1433   ALT 12 03/02/2016 1433   ALKPHOS 109 03/02/2016 1433   BILITOT 0.82 03/02/2016 1433   GFRNONAA 45 (L) 02/08/2016 1859   GFRAA 52 (L) 02/08/2016 1859      Lab Results  Component Value Date   TSH 6.595 (H) 03/02/2016   Lab Results  Component Value Date   TSH 6.595 (H) 03/02/2016      RADIOGRAPHY: Ct Soft Tissue Neck W Contrast  Result Date: 03/02/2016 CLINICAL DATA:  61 year old female with squamous cell carcinoma along the entire left lateral tongue. Status post surgical resection, left hemiglossectomy and selective neck dissection (zones 1 through 3). Subsequent encounter. EXAM: CT NECK WITH CONTRAST TECHNIQUE: Multidetector CT imaging of the neck was performed using the standard protocol following the bolus administration of intravenous contrast. CONTRAST:  67mL ISOVUE-300 IOPAMIDOL (ISOVUE-300) INJECTION 61% in conjunction with contrast enhanced imaging of the chest reported separately. COMPARISON:  Pretreatment neck CT 01/05/2016 FINDINGS: Pharynx and larynx: Motion artifact at the subglottic larynx. Laryngeal soft tissue contours appear within normal limits. Pharyngeal soft tissue contours are within normal limits. Parapharyngeal and retropharyngeal spaces appear normal. Salivary glands: The left submandibular gland appears to be surgically absent, with an indistinct area of generalized soft tissue thickening which extends from the posterior left sublingual space/anterior submandibular space posteriorly to the  left carotid space. See series 5, image 41. This was the same region of the of the left level 2/3 malignant 17 mm lymph node on the pretreatment study. The postoperative soft tissue thickening extends cephalad to the inferior parotid space and caudally to the level of the thyroid cartilage. The soft tissue changes are inseparable from the anterior residual left sternocleidomastoid muscle. There is mild soft tissue thickening and asymmetry along the residual left posterolateral oral tongue near the left retromolar trigone as seen on series  5, image 30. This is near the area of the tongue mass delineated on the pretreatment CT. The anterior sublingual space, the right submandibular space and the right submandibular gland are stable and within normal limits. See right level 1 lymph node findings below. Aside from the postoperative changes to the inferior left parotid space the parotid glands are stable and within normal limits. Thyroid: Stable, negative. Lymph nodes: Abnormal right level 1B lymph node is rounded and measures 9-10 mm short axis now (series 5, image 45), versus 4 mm or smaller in January. Small level 1a lymph nodes remain normal on series 5, image 51. The left level 1B, level 2, and level 3a nodal stations are obscured by the confluent soft tissue described above (series 5, image 41). Small level IIIb lymph nodes remain normal (i.e. Series 5, image 44). Right level 2, right level 3, bilateral level 4 and bilateral level 5 nodes are stable and within normal limits. Vascular: Major vascular structures in the neck and at the skullbase remain patent, including the left internal jugular vein which is diminutive. The postoperative confluent soft tissue on the left does involve the left carotid space, but there is no mass effect on the left carotid. Limited intracranial: Negative. Visualized orbits: Negative. Mastoids and visualized paranasal sinuses: Visualized paranasal sinuses and mastoids are stable and  well pneumatized. Skeleton: No acute osseous abnormality identified. Mandible is intact. Upper chest: Chest CT today reported separately. IMPRESSION: 1. Newly enlarged and round 10 mm right level 1b lymph node (series 5, image 45). The level 1A nodes remain normal. This right 1 B node is nonspecific but suspicious for progressive nodal metastatic disease. It might be amenable to Ultrasound-guided FNA. 2. Interval left hemiglossectomy and selective left neck dissection with confluent indeterminate soft tissue from the anterior left submandibular space through the left carotid space and obscuring the left level 1B, level 2, and level IIIa nodal stations. This study will serve as a new postoperative baseline of this area. Attention directed on close interval follow-up. 3. Mild soft tissue thickening also along the left lateral tongue resection margin. Attention directed on followup. 4. Chest CT today reported separately. Electronically Signed   By: Genevie Ann M.D.   On: 03/02/2016 16:05   Ct Chest W Contrast  Result Date: 03/02/2016 CLINICAL DATA:  Left lateral tongue squamous cell carcinoma status post partial left glossectomy with neck dissection 02/08/2016. 3 of 20 regional lymph nodes in the neck positive for metastatic disease. Chest staging. EXAM: CT CHEST WITH CONTRAST TECHNIQUE: Multidetector CT imaging of the chest was performed during intravenous contrast administration. CONTRAST:  59mL ISOVUE-300 IOPAMIDOL (ISOVUE-300) INJECTION 61% COMPARISON:  05/24/2015 chest CT angiogram. FINDINGS: Cardiovascular: Normal heart size. No significant pericardial fluid/thickening. Atherosclerotic nonaneurysmal thoracic aorta (maximum diameter of the ascending thoracic aorta 3.7 cm, stable). Normal caliber pulmonary arteries. No central pulmonary emboli. Mediastinum/Nodes: Stable hypodense 0.8 cm left thyroid lobe nodule. Unremarkable esophagus. No pathologically enlarged axillary, mediastinal or hilar lymph nodes.  Lungs/Pleura: No pneumothorax. No pleural effusion. Subpleural superior segment right lower lobe 3 mm solid pulmonary nodule (series 8/ image 54), subpleural 3 mm anterior right middle lobe pulmonary nodule associated with the minor fissure (series 8/ image 74), subpleural anterior right lower lobe 3 mm solid pulmonary nodule (series 8/ image 88) and subpleural 3 mm basilar left lower lobe solid pulmonary nodule (series 8/ image 117) are all stable back to at least 02/03/2012 and considered benign. No acute consolidative airspace disease, lung masses or additional significant  pulmonary nodules. Upper abdomen: Cholecystectomy. Hypodense subcentimeter anterior upper right renal cortical lesion is too small to characterize and not appreciably changed back to the 02/03/2012, most consistent with a benign renal cyst. Musculoskeletal: No aggressive appearing focal osseous lesions. Mild thoracic spondylosis. IMPRESSION: 1. No evidence of metastatic disease in the chest. 2. Aortic atherosclerosis. Electronically Signed   By: Ilona Sorrel M.D.   On: 03/02/2016 16:54      IMPRESSION/PLAN: Post operative oral tongue cancer, high risk.  This is a delightful patient with head and neck cancer. I do recommend radiotherapy for this patient.  We discussed the potential risks, benefits, and side effects of radiotherapy. We talked in detail about acute and late effects. We discussed that some of the most bothersome acute effects may be mucositis, dysgeusia, salivary changes, skin irritation, hair loss, dehydration, weight loss and fatigue. We talked about late effects which include but are not necessarily limited to dysphagia, hypothyroidism, nerve injury, spinal cord injury, xerostomia, trismus, and neck edema. No guarantees of treatment were given. A consent form was signed and placed in the patient's medical record. The patient is enthusiastic about proceeding with treatment. I look forward to participating in the patient's  care.    Simulation (treatment planning) will take place this morning. After sim she will see otolaryngology to rule out clinical local recurrence of her tongue at this time.  We also discussed that the treatment of head and neck cancer is a multidisciplinary process to maximize treatment outcomes and quality of life. For this reasons the following referrals have been or will be made:  Medical oncology to discuss chemotherapy tomorrow.  Dentistry for dental evaluation, possible extractions in the radiation fields, and /or advice on reducing risk of cavities, osteoradionecrosis, or other oral issues: The patient has been cleared by dentistry.  Nutritionist for nutrition support during and after treatment. I also ordered a PEG tube to support her nutrition during therapy.  Speech language pathology for swallowing and/or speech therapy.  Social work for social support.   Physical therapy due to risk of lymphedema in neck and deconditioning.  Baseline labs including TSH. Of note, her TSH was elevated and her T3 was borderline low. She acknowledges not taking her thyroid supplements due to dysphagia from trying to swallow the tablets. Therefore, I recommended she discuss this with her pharmacist to see if she could have this switched to a liquid.  Dysphagia: I ordered an MBSS.  At least 45 minutes was spent face to face with patient, over 50% on counseling and care coordination. __________________________________________   Eppie Gibson, MD  This document serves as a record of services personally performed by Eppie Gibson, MD. It was created on her behalf by Darcus Austin, a trained medical scribe. The creation of this record is based on the scribe's personal observations and the provider's statements to them. This document has been checked and approved by the attending provider.

## 2016-03-08 DIAGNOSIS — R634 Abnormal weight loss: Secondary | ICD-10-CM | POA: Insufficient documentation

## 2016-03-08 DIAGNOSIS — Z7189 Other specified counseling: Secondary | ICD-10-CM | POA: Insufficient documentation

## 2016-03-08 DIAGNOSIS — R471 Dysarthria and anarthria: Secondary | ICD-10-CM | POA: Insufficient documentation

## 2016-03-08 NOTE — Assessment & Plan Note (Signed)
Her case was reviewed at the ENT tumor board. We discussed about the role of adjuvant chemotherapy. She has high risk disease due to lymph node involvement, positive margin, extra capsular extension, etc. The patient is recommended to undergo concurrent chemoradiation therapy. I am concerned about prescribing high-dose cisplatin for her given the fact that she has recent weight loss. The patient is a Jehovah witness and will  decline blood products I recommend we pursue treatment with weekly cisplatin instead of high-dose cisplatin. We discussed the role of chemotherapy. The intent is of curative intent.  We discussed some of the risks, benefits, side-effects of cisplatin  Some of the short term side-effects included, though not limited to, including weight loss, life threatening infections, risk of allergic reactions, need for transfusions of blood products, nausea, vomiting, change in bowel habits, loss of hair, admission to hospital for various reasons, and risks of death.   Long term side-effects are also discussed including risks of infertility, permanent damage to nerve function, hearing loss, chronic fatigue, kidney damage with possibility needing hemodialysis, and rare secondary malignancy including bone marrow disorders.  The patient is aware that the response rates discussed earlier is not guaranteed.  After a long discussion, patient made an informed decision to proceed with the prescribed plan of care.   Patient education material was dispensed. I recommend port placement and she agreed to proceed. I recommend chemo education class and I will schedule that. The patient will be seen on a weekly basis after chemotherapy is started. I reinforced importance of adequate hydration

## 2016-03-08 NOTE — Progress Notes (Signed)
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 female is here because of newly diagnosed squamous cell carcinoma of the tongue. Multiple family members are here.  Her husband of 20 years is present.  According to the patient, the first initial presentation was due to sensation of soreness at the tip of her tongue, causing difficulties with eating and chewing.  Because of this, she has lost at least 24 pounds over several months prior to surgery. she denies any hearing deficit, swallowing difficulties, painful swallowing, changes in the quality of her voice or hemoptysis. Her symptoms dated back to November 2017.  Since the patient is not getting any better, she subsequently underwent further evaluation.   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[place])   02/08/2016 Pathology Results    Diagnosis 1. 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 Tongue, biopsy, Anterior deep margin left SQUAMOUS CELL CARCINOMA 3. Tongue, biopsy, Posterior deep margin left NEGATIVE FOR CARCINOMA 4. Tongue, biopsy, Posterior margin left SQUAMOUS CELL CARCINOMA 5. Tongue, excisional biopsy, New anterior deep margin NEGATIVE FOR CARCINOMA 6. Tongue, excisional biopsy, New  posterior deep margin SMALL FOCUS OF SQUAMOUS CELL CARCINOMA (0.1 CM, ONLY PRESENT AT THE DEEPER PERMINENT SECTION) 7. Tongue, resection for tumor, Left lateral INFILTRATIVE KERATINIZED SQUAMOUS CELL CARCINOMA (3.2 CM) THE TUMOR INVADES UNDERNEATH MUSCLE OF THE TONGUE (2.0 CM, PT2) LYMPHOVASCULAR AND PERINEURAL INVASION IDENTIFIED SQUAMOUS CELL CARCINOMA PRESENTED AT (FINAL MARGINS REFER TO PART 2-6) LATERAL INKED MARGIN (1.0 CM) MEDIAL INKED MARGIN (0.6 CM)      02/08/2016 Surgery    SURGICAL PROCEDURES: 1. Left hemiglossectomy with primary closure. 2. Left selective neck dissection (zones I-III).      03/02/2016 Imaging    CT chest with contrast: No evidence of metastatic disease in the chest. 2. Aortic atherosclerosis.       03/02/2016 Imaging    CT neck with contrast showed : Newly enlarged and round 10 mm right level 1b lymph node (series 5, image 45). The level 1A nodes remain normal. This right 1 B node is nonspecific but suspicious for progressive nodal metastatic disease. It might be amenable to Ultrasound-guided FNA. 2. Interval left hemiglossectomy and selective left neck dissection with confluent indeterminate soft tissue from the anterior left submandibular space through the left carotid space and obscuring the left level 1B, level 2, and level IIIa nodal stations. This study will serve as a new postoperative baseline of this area. Attention directed on close interval follow-up. 3. Mild soft tissue thickening also along the left lateral tongue resection margin. Attention directed on followup. 4. Chest CT today reported separately      Since surgery, she continues to have pain at the incision site. She had mild hoarseness.  Her weight loss has stabilized.  MEDICAL HISTORY:  Past Medical History:  Diagnosis Date  . Anxiety   .  Arthritis   . Depression    takes ZYban daily  . History of colon polyps    benign  . Hyperlipidemia    takes Atorvastatin daily  . Hypertension     takes HCTZ daily  . Hypothyroidism    takes Synthroid daily  . Insomnia    takes Ambien nightly and Xanax as needed  . Refusal of blood transfusions as patient is Jehovah's Witness     SURGICAL HISTORY: Past Surgical History:  Procedure Laterality Date  . CHOLECYSTECTOMY    . COLONOSCOPY    . DILATION AND CURETTAGE OF UTERUS    . EXCISION OF TONGUE LESION Left 02/08/2016   Procedure: WIDE LOCAL EXCISION OF LEFT LATERAL TONGUE;  Surgeon: Jerrell Belfast, MD;  Location: [facility];  Service: ENT;  Laterality: Left;  . FEMUR IM NAIL Right 02/28/2012   Procedure: OPEN REDUCTION INTERNAL FIXATION (ORIF) RIGHT KNEE;  Surgeon: Alta Corning, MD;  Location: [facility];  Service: Orthopedics;  Laterality: Right;  . KNEE ARTHROPLASTY  12/14/2011   Procedure: COMPUTER ASSISTED TOTAL KNEE ARTHROPLASTY;  Surgeon: Alta Corning, MD;  Location: [facility];  Service: Orthopedics;  Laterality: Right;  . KNEE ARTHROSCOPY     Right  . PLANTAR FASCIA SURGERY     right foot  . RADICAL NECK DISSECTION Left 02/08/2016   Procedure: LEFT SELECTIVE RADICAL NECK DISSECTION;  Surgeon: Jerrell Belfast, MD;  Location: [facility];  Service: ENT;  Laterality: Left;  . TENDON RELEASE     right wrist  . TOTAL KNEE ARTHROPLASTY  12/14/2011   right knee    SOCIAL HISTORY: Social History   Social History  . Marital status: Married    Spouse name: Ebony Hail  . Number of children: 2  . Years of education: College   Occupational History  . CUSTOMER SERVICE REP  [employer]   Social History Main Topics  . Smoking status: Never Smoker  . Smokeless tobacco: Never Used  . Alcohol use Yes     Comment: very rarely  . Drug use: No  . Sexual activity: Not on file   Other Topics Concern  . Not on file   Social History Narrative   Patient is married Ebony Hail) and lives at home with her husband.   Patient has a college education.   Patient drinks two sodas per day.   Patient has two children.   Patient is right-handed.           FAMILY HISTORY: Family History  Problem Relation Age of Onset  . Anemia Mother   . Hypertension Mother   . Heart attack Father     ALLERGIES:  has No Known Allergies.  MEDICATIONS:  Current Outpatient Prescriptions  Medication Sig Dispense Refill  . atorvastatin (LIPITOR) 10 MG tablet Take 10 mg by mouth daily at 6 PM.     . buPROPion (ZYBAN) 150 MG 12 hr tablet Take 150 mg by mouth daily.    . chlorhexidine (PERIDEX) 0.12 % solution Rinse with 15 mls three times daily for 30 seconds. Use after breakfast, dinner, and at bedtime. Spit out excess. Do not swallow. 480 mL prn  . HYDROcodone-acetaminophen (HYCET) 7.5-325 mg/15 ml solution Take 10-15 mLs by mouth every 4 (four) hours as needed for moderate pain. 473 mL 0  . levothyroxine (SYNTHROID, LEVOTHROID) 75 MCG tablet Take 75 mcg by mouth daily before breakfast.     . zolpidem (AMBIEN) 10 MG tablet Take 10 mg by mouth at bedtime.     Marland Kitchen  ALPRAZolam (XANAX) 0.5 MG tablet Take 0.5 mg by mouth at bedtime as needed for sleep.     . fentaNYL (DURAGESIC - DOSED MCG/HR) 25 MCG/HR patch Place 1 patch (25 mcg total) onto the skin every 3 (three) days. 5 patch 0  . hydrochlorothiazide (MICROZIDE) 12.5 MG capsule Take 12.5 mg by mouth daily.     Marland Kitchen lidocaine-prilocaine (EMLA) cream Apply to affected area once 30 g 3  . Morphine Sulfate (MORPHINE CONCENTRATE) 10 mg / 0.5 ml concentrated solution Take 0.5 mLs (10 mg total) by mouth every 2 (two) hours as needed for severe pain. 120 mL 0  . naproxen sodium (ANAPROX) 220 MG tablet Take 220 mg by mouth 2 (two) times daily as needed (pain).    . ondansetron (ZOFRAN) 8 MG tablet Take 1 tablet (8 mg total) by mouth 2 (two) times daily as needed. Start on the third day after chemotherapy. 30 tablet 1  . prochlorperazine (COMPAZINE) 10 MG tablet Take 1 tablet (10 mg total) by mouth every 6 (six) hours as needed (Nausea or vomiting). 30 tablet 1  . triamcinolone (KENALOG) 0.1 % paste APPLY TO AFFECTED AREA      No current facility-administered medications for this visit.     REVIEW OF SYSTEMS:   Constitutional: Denies fevers, chills or abnormal night sweats Eyes: Denies blurriness of vision, double vision or watery eyes Ears, nose, mouth, throat, and face: Denies mucositis or sore throat Respiratory: Denies cough, dyspnea or wheezes Cardiovascular: Denies palpitation, chest discomfort or lower extremity swelling Gastrointestinal:  Denies nausea, heartburn or change in bowel habits Skin: Denies abnormal skin rashes Lymphatics: Denies new lymphadenopathy or easy bruising Neurological:Denies numbness, tingling or new weaknesses Behavioral/Psych: Mood is stable, no new changes  All other systems were reviewed with the patient and are negative.  PHYSICAL EXAMINATION: ECOG PERFORMANCE STATUS: 1 - Symptomatic but completely ambulatory  Vitals:   03/07/16 1253  BP: (!) 145/85  Pulse: (!) 111  Resp: 18  Temp: 98.1 F (36.7 C)   Filed Weights   03/07/16 1253  Weight: 221 lb 1.6 oz (100.3 kg)    GENERAL:alert, no distress and comfortable SKIN: skin color, texture, turgor are normal, no rashes or significant lesions EYES: normal, conjunctiva are pink and non-injected, sclera clear OROPHARYNX: Noted irregularity at the tip of the tongue at the incision site NECK: Well-healed surgical scar on the left side of her neck LYMPH:  no palpable lymphadenopathy in the cervical, axillary or inguinal LUNGS: clear to auscultation and percussion with normal breathing effort HEART: regular rate & rhythm and no murmurs and no lower extremity edema ABDOMEN:abdomen soft, non-tender and normal bowel sounds Musculoskeletal:no cyanosis of digits and no clubbing  PSYCH: alert & oriented x 3 with mild dysarthria due to recent surgery and pain NEURO: no focal motor/sensory deficits  LABORATORY DATA:  I have reviewed the data as listed Lab Results  Component Value Date   WBC 8.5 03/02/2016   HGB 13.2  03/02/2016   HCT 37.9 03/02/2016   MCV 82.8 03/02/2016   PLT 271 03/02/2016   Lab Results  Component Value Date   NA 140 03/02/2016   K 4.4 03/02/2016   CL 99 (L) 02/07/2016   CO2 26 03/02/2016    RADIOGRAPHIC STUDIES: I have personally reviewed the radiological images as listed and agreed with the findings in the report. Ct Soft Tissue Neck W Contrast  Result Date: 03/02/2016 CLINICAL DATA:  61 year old female with squamous cell carcinoma along the entire left  lateral tongue. Status post surgical resection, left hemiglossectomy and selective neck dissection (zones 1 through 3). Subsequent encounter. EXAM: CT NECK WITH CONTRAST TECHNIQUE: Multidetector CT imaging of the neck was performed using the standard protocol following the bolus administration of intravenous contrast. CONTRAST:  22mL ISOVUE-300 IOPAMIDOL (ISOVUE-300) INJECTION 61% in conjunction with contrast enhanced imaging of the chest reported separately. COMPARISON:  Pretreatment neck CT 01/05/2016 FINDINGS: Pharynx and larynx: Motion artifact at the subglottic larynx. Laryngeal soft tissue contours appear within normal limits. Pharyngeal soft tissue contours are within normal limits. Parapharyngeal and retropharyngeal spaces appear normal. Salivary glands: The left submandibular gland appears to be surgically absent, with an indistinct area of generalized soft tissue thickening which extends from the posterior left sublingual space/anterior submandibular space posteriorly to the left carotid space. See series 5, image 41. This was the same region of the of the left level 2/3 malignant 17 mm lymph node on the pretreatment study. The postoperative soft tissue thickening extends cephalad to the inferior parotid space and caudally to the level of the thyroid cartilage. The soft tissue changes are inseparable from the anterior residual left sternocleidomastoid muscle. There is mild soft tissue thickening and asymmetry along the residual left  posterolateral oral tongue near the left retromolar trigone as seen on series 5, image 30. This is near the area of the tongue mass delineated on the pretreatment CT. The anterior sublingual space, the right submandibular space and the right submandibular gland are stable and within normal limits. See right level 1 lymph node findings below. Aside from the postoperative changes to the inferior left parotid space the parotid glands are stable and within normal limits. Thyroid: Stable, negative. Lymph nodes: Abnormal right level 1B lymph node is rounded and measures 9-10 mm short axis now (series 5, image 45), versus 4 mm or smaller in January. Small level 1a lymph nodes remain normal on series 5, image 51. The left level 1B, level 2, and level 3a nodal stations are obscured by the confluent soft tissue described above (series 5, image 41). Small level IIIb lymph nodes remain normal (i.e. Series 5, image 44). Right level 2, right level 3, bilateral level 4 and bilateral level 5 nodes are stable and within normal limits. Vascular: Major vascular structures in the neck and at the skullbase remain patent, including the left internal jugular vein which is diminutive. The postoperative confluent soft tissue on the left does involve the left carotid space, but there is no mass effect on the left carotid. Limited intracranial: Negative. Visualized orbits: Negative. Mastoids and visualized paranasal sinuses: Visualized paranasal sinuses and mastoids are stable and well pneumatized. Skeleton: No acute osseous abnormality identified. Mandible is intact. Upper chest: Chest CT today reported separately. IMPRESSION: 1. Newly enlarged and round 10 mm right level 1b lymph node (series 5, image 45). The level 1A nodes remain normal. This right 1 B node is nonspecific but suspicious for progressive nodal metastatic disease. It might be amenable to Ultrasound-guided FNA. 2. Interval left hemiglossectomy and selective left neck  dissection with confluent indeterminate soft tissue from the anterior left submandibular space through the left carotid space and obscuring the left level 1B, level 2, and level IIIa nodal stations. This study will serve as a new postoperative baseline of this area. Attention directed on close interval follow-up. 3. Mild soft tissue thickening also along the left lateral tongue resection margin. Attention directed on followup. 4. Chest CT today reported separately. Electronically Signed   By: Herminio Heads.D.  On: 03/02/2016 16:05   Ct Chest W Contrast  Result Date: 03/02/2016 CLINICAL DATA:  Left lateral tongue squamous cell carcinoma status post partial left glossectomy with neck dissection 02/08/2016. 3 of 20 regional lymph nodes in the neck positive for metastatic disease. Chest staging. EXAM: CT CHEST WITH CONTRAST TECHNIQUE: Multidetector CT imaging of the chest was performed during intravenous contrast administration. CONTRAST:  22mL ISOVUE-300 IOPAMIDOL (ISOVUE-300) INJECTION 61% COMPARISON:  05/24/2015 chest CT angiogram. FINDINGS: Cardiovascular: Normal heart size. No significant pericardial fluid/thickening. Atherosclerotic nonaneurysmal thoracic aorta (maximum diameter of the ascending thoracic aorta 3.7 cm, stable). Normal caliber pulmonary arteries. No central pulmonary emboli. Mediastinum/Nodes: Stable hypodense 0.8 cm left thyroid lobe nodule. Unremarkable esophagus. No pathologically enlarged axillary, mediastinal or hilar lymph nodes. Lungs/Pleura: No pneumothorax. No pleural effusion. Subpleural superior segment right lower lobe 3 mm solid pulmonary nodule (series 8/ image 54), subpleural 3 mm anterior right middle lobe pulmonary nodule associated with the minor fissure (series 8/ image 74), subpleural anterior right lower lobe 3 mm solid pulmonary nodule (series 8/ image 88) and subpleural 3 mm basilar left lower lobe solid pulmonary nodule (series 8/ image 117) are all stable back to at least  02/03/2012 and considered benign. No acute consolidative airspace disease, lung masses or additional significant pulmonary nodules. Upper abdomen: Cholecystectomy. Hypodense subcentimeter anterior upper right renal cortical lesion is too small to characterize and not appreciably changed back to the 02/03/2012, most consistent with a benign renal cyst. Musculoskeletal: No aggressive appearing focal osseous lesions. Mild thoracic spondylosis. IMPRESSION: 1. No evidence of metastatic disease in the chest. 2. Aortic atherosclerosis. Electronically Signed   By: Ilona Sorrel M.D.   On: 03/02/2016 16:54    ASSESSMENT:  Newly diagnosed squamous cell carcinoma of the Head & Neck, HPV N/A  PLAN:  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[facility]) Her case was reviewed at the ENT tumor board. We discussed about the role of adjuvant chemotherapy. She has high risk disease due to lymph node involvement, positive margin, extra capsular extension, etc. The patient is recommended to undergo concurrent chemoradiation therapy. I am concerned about prescribing high-dose cisplatin for her given the fact that she has recent weight loss. The patient is a Jehovah witness and will  decline blood products I recommend we pursue treatment with weekly cisplatin instead of high-dose cisplatin. We discussed the role of chemotherapy. The intent is of curative intent.  We discussed some of the risks, benefits, side-effects of cisplatin  Some of the short term side-effects included, though not limited to, including weight loss, life threatening infections, risk of allergic reactions, need for transfusions of blood products, nausea, vomiting, change in bowel habits, loss of hair, admission to hospital for various reasons, and risks of death.   Long term side-effects are also discussed including risks of infertility, permanent damage to nerve function, hearing loss, chronic fatigue, kidney damage with possibility needing hemodialysis,  and rare secondary malignancy including bone marrow disorders.  The patient is aware that the response rates discussed earlier is not guaranteed.  After a long discussion, patient made an informed decision to proceed with the prescribed plan of care.   Patient education material was dispensed. I recommend port placement and she agreed to proceed. I recommend chemo education class and I will schedule that. The patient will be seen on a weekly basis after chemotherapy is started. I reinforced importance of adequate hydration   Cancer associated pain She has poorly controlled pain. I recommend fentanyl patch and liquid morphine  sulfate as needed and will assess pain control and her next visit I warned her about risk of constipation, sedation and nausea. We discussed narcotic refill policy  Weight loss She has abnormal weight loss prior to diagnosis. She will get counseling and nutrition consult next week. We discussed the importance of feeding tube placement and she agreed to proceed  Dysarthria She has dysarthria due to pain and recent surgery. We will get referral to see speech and language therapist    Orders Placed This Encounter  Procedures  . IR FLUORO GUIDE PORT INSERTION RIGHT    Standing Status:   Future    Standing Expiration Date:   05/07/2017    Order Specific Question:   Reason for Exam (SYMPTOM  OR DIAGNOSIS REQUIRED)    Answer:   need port for chemo; please coordinate PEG and port to be done same day    Order Specific Question:   Preferred Imaging Location?    Answer:   [facility]  . Comprehensive metabolic panel    Standing Status:   Standing    Number of Occurrences:   22    Standing Expiration Date:   03/07/2017  . Magnesium    Standing Status:   Standing    Number of Occurrences:   22    Standing Expiration Date:   03/07/2017  . CBC with Differential    Standing Status:   Standing    Number of Occurrences:   20    Standing Expiration Date:    03/08/2017    All questions were answered. The patient knows to call the clinic with any problems, questions or concerns. I spent 60 minutes counseling the patient face to face. The total time spent in the appointment was 95 minutes and more than 50% was on counseling.     [name], MD 03/08/16 2:30 PM

## 2016-03-08 NOTE — Assessment & Plan Note (Signed)
She has abnormal weight loss prior to diagnosis. She will get counseling and nutrition consult next week. We discussed the importance of feeding tube placement and she agreed to proceed

## 2016-03-08 NOTE — Assessment & Plan Note (Signed)
She has poorly controlled pain. I recommend fentanyl patch and liquid morphine sulfate as needed and will assess pain control and her next visit I warned her about risk of constipation, sedation and nausea. We discussed narcotic refill policy

## 2016-03-08 NOTE — Assessment & Plan Note (Signed)
She has dysarthria due to pain and recent surgery. We will get referral to see speech and language therapist

## 2016-03-09 ENCOUNTER — Ambulatory Visit: Payer: 59

## 2016-03-09 ENCOUNTER — Other Ambulatory Visit: Payer: Self-pay | Admitting: Radiation Oncology

## 2016-03-09 DIAGNOSIS — C023 Malignant neoplasm of anterior two-thirds of tongue, part unspecified: Secondary | ICD-10-CM

## 2016-03-12 ENCOUNTER — Other Ambulatory Visit (HOSPITAL_COMMUNITY): Payer: Self-pay | Admitting: Radiation Oncology

## 2016-03-12 ENCOUNTER — Encounter: Payer: Self-pay | Admitting: *Deleted

## 2016-03-12 ENCOUNTER — Encounter: Payer: Self-pay | Admitting: Radiation Oncology

## 2016-03-12 DIAGNOSIS — R1319 Other dysphagia: Secondary | ICD-10-CM

## 2016-03-12 NOTE — Progress Notes (Signed)
Oncology Nurse Navigator Documentation  Delivered 'Standard Authorization for Release and Use of Medical Information' and 'Attending Physician Statement' forms rec'd 03/07/16 to [facility], RadOnc Administration.  [name], RN, BSN, [place] [facility] Oncology Nurse [facility] [phone] -

## 2016-03-12 NOTE — Progress Notes (Signed)
Paperwork ([employer], disability) received 03/12/16, given to nursing 03/13/16

## 2016-03-13 ENCOUNTER — Other Ambulatory Visit: Payer: 59

## 2016-03-13 ENCOUNTER — Encounter: Payer: Self-pay | Admitting: *Deleted

## 2016-03-13 ENCOUNTER — Ambulatory Visit: Payer: 59

## 2016-03-13 ENCOUNTER — Encounter: Payer: Self-pay | Admitting: Radiation Oncology

## 2016-03-13 ENCOUNTER — Ambulatory Visit (HOSPITAL_COMMUNITY)
Admission: RE | Admit: 2016-03-13 | Discharge: 2016-03-13 | Disposition: A | Discharge status: Home / Self Care | Payer: 59 | Source: Ambulatory Visit | Attending: Radiation Oncology | Admitting: Radiation Oncology

## 2016-03-13 ENCOUNTER — Ambulatory Visit: Payer: 59 | Admitting: Nutrition

## 2016-03-13 ENCOUNTER — Ambulatory Visit: Payer: 59 | Admitting: Physical Therapy

## 2016-03-13 ENCOUNTER — Ambulatory Visit
Admission: RE | Admit: 2016-03-13 | Discharge: 2016-03-13 | Disposition: A | Discharge status: Home / Self Care | Payer: 59 | Source: Ambulatory Visit | Attending: Radiation Oncology | Admitting: Radiation Oncology

## 2016-03-13 ENCOUNTER — Ambulatory Visit: Payer: 59 | Admitting: Radiation Oncology

## 2016-03-13 ENCOUNTER — Ambulatory Visit: Payer: 59 | Attending: Radiation Oncology

## 2016-03-13 VITALS — BP 144/91 | HR 115 | Temp 98.1°F | Resp 18 | Wt 218.4 lb

## 2016-03-13 DIAGNOSIS — M25612 Stiffness of left shoulder, not elsewhere classified: Secondary | ICD-10-CM | POA: Diagnosis present

## 2016-03-13 DIAGNOSIS — R471 Dysarthria and anarthria: Secondary | ICD-10-CM | POA: Diagnosis present

## 2016-03-13 DIAGNOSIS — R293 Abnormal posture: Secondary | ICD-10-CM | POA: Diagnosis present

## 2016-03-13 DIAGNOSIS — R1319 Other dysphagia: Secondary | ICD-10-CM

## 2016-03-13 DIAGNOSIS — C021 Malignant neoplasm of border of tongue: Secondary | ICD-10-CM | POA: Insufficient documentation

## 2016-03-13 DIAGNOSIS — Z510 Encounter for antineoplastic radiation therapy: Secondary | ICD-10-CM | POA: Diagnosis not present

## 2016-03-13 DIAGNOSIS — R29898 Other symptoms and signs involving the musculoskeletal system: Secondary | ICD-10-CM | POA: Insufficient documentation

## 2016-03-13 DIAGNOSIS — R1310 Dysphagia, unspecified: Secondary | ICD-10-CM | POA: Diagnosis present

## 2016-03-13 MED FILL — fentaNYL 25 MCG/HR PT72: 25 | 15 days supply | Qty: 5 | Fill #0

## 2016-03-13 MED FILL — MORPHINE SULF 100 MG/5 ML S: 100 | 20 days supply | Qty: 120 | Fill #0

## 2016-03-13 NOTE — Patient Instructions (Signed)
SWALLOWING EXERCISES Do these until 6 months after your last day of radiation, then 2 times per week afterwards  1. Effortful Swallows - Press your tongue against the roof of your mouth for 3 seconds, then squeeze the muscles in your neck while you swallow your saliva or a sip of water - Repeat 20 times, 2-3 times a day, and use whenever you eat or drink  2. Masako Swallow - swallow with your tongue sticking out - Stick tongue out past your teeth and gently bite tongue with your teeth - Swallow, while holding your tongue with your teeth - Repeat 20 times, 2-3 times a day *use a wet spoon if your mouth gets dry*  3. Shaker Exercise - head lift - Lie flat on your back in your bed or on a couch without pillows - Raise your head and look at your feet - KEEP YOUR SHOULDERS DOWN - HOLD FOR 45-60 SECONDS, then lower your head back down - Repeat 3 times, 2-3 times a day  4. Mendelsohn Maneuver - "half swallow" exercise - Start to swallow, and keep your Adam's apple up by squeezing hard with the muscles of the throat - Hold the squeeze for 5-7 seconds and then relax - Repeat 20 times, 2-3 times a day *use a wet spoon if your mouth gets dry*  5. Breath Hold - Say "HUH!" loudly, then hold your breath for 3 seconds at your voice box - Repeat 20 times, 2-3 times a day  6. Chin pushback - Open your mouth  - Place your fist UNDER your chin near your neck, and push back with your fist for 5 seconds - Repeat 10 times, 2-3 times a day  7. Tongue Stretch/Teeth Clean - Move your tongue around the pocket between your gums and teeth, clockwise and then counter-clockwise - Repeat on the back side, if desired, clockwise and then counter-clockwise - Repeat 10-15 times, 2-3 times a day

## 2016-03-13 NOTE — Therapy (Signed)
[place], [place], [address] Phone: [phone]   Fax:  [phone]  Physical Therapy Evaluation  Patient Details  Name: Tiffany Yu MRN: [id number] Date of Birth: 11-Aug-1955 Referring Provider: Dr. Eppie Gibson  Encounter Date: 03/13/2016      PT End of Session - 03/13/16 1117    Visit Number 1   Number of Visits 1   PT Start Time 4008   PT Stop Time 1047   PT Time Calculation (min) 32 min   Activity Tolerance Patient tolerated treatment well   Behavior During Therapy [facility] for tasks assessed/performed      Past Medical History:  Diagnosis Date  . Anxiety   . Arthritis   . Depression    takes ZYban daily  . History of colon polyps    benign  . Hyperlipidemia    takes Atorvastatin daily  . Hypertension    takes HCTZ daily  . Hypothyroidism    takes Synthroid daily  . Insomnia    takes Ambien nightly and Xanax as needed  . Refusal of blood transfusions as patient is Jehovah's Witness     Past Surgical History:  Procedure Laterality Date  . CHOLECYSTECTOMY    . COLONOSCOPY    . DILATION AND CURETTAGE OF UTERUS    . EXCISION OF TONGUE LESION Left 02/08/2016   Procedure: WIDE LOCAL EXCISION OF LEFT LATERAL TONGUE;  Surgeon: Jerrell Belfast, MD;  Location: [facility];  Service: ENT;  Laterality: Left;  . FEMUR IM NAIL Right 02/28/2012   Procedure: OPEN REDUCTION INTERNAL FIXATION (ORIF) RIGHT KNEE;  Surgeon: Alta Corning, MD;  Location: [facility];  Service: Orthopedics;  Laterality: Right;  . KNEE ARTHROPLASTY  12/14/2011   Procedure: COMPUTER ASSISTED TOTAL KNEE ARTHROPLASTY;  Surgeon: Alta Corning, MD;  Location: [place] [facility];  Service: Orthopedics;  Laterality: Right;  . KNEE ARTHROSCOPY     Right  . PLANTAR FASCIA SURGERY     right foot  . RADICAL NECK DISSECTION Left 02/08/2016   Procedure: LEFT SELECTIVE RADICAL NECK DISSECTION;  Surgeon: Jerrell Belfast, MD;  Location: [facility];  Service: ENT;   Laterality: Left;  . TENDON RELEASE     right wrist  . TOTAL KNEE ARTHROPLASTY  12/14/2011   right knee    There were no vitals filed for this visit.       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 Chemo-RT is to start 03/15/16.  HTN; h/o right TKA, which patient fell on about 2 months after it was done in 12/2011 so had femur rod placed 02/2012.  Left knee arthroscopic procedure in September 2017.   Patient Stated Goals get info from all head & neck clinic providers   Currently in Pain? 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 Eppie Gibson   Onset Date/Surgical Date 02/08/16   Prior Therapy none     Precautions   Precautions Other (comment)   Precaution Comments cancer precautions     Restrictions   Weight Bearing Restrictions No     Balance Screen   Has the patient fallen in the past 6 months No   Has the patient had a decrease in activity level because of a fear of falling?  No  but is very cautious; has fallen in the past   Is the patient reluctant to leave their home because of a fear of falling?  No     Home Ecologist residence   Living Arrangements Spouse/significant other   Type of [place] to enter  one flight   Entrance  Stairs-Rails Right;Left     Prior Function   Level of Independence Independent   Leisure no regular exercise, little activity     Cognition   Overall Cognitive Status Within Functional Limits for tasks assessed     Observation/Other Assessments   Observations appears puffy in both cheeks, left > right; she says she is normally somewhat like this     Functional Tests   Functional tests Sit to Stand     Sit to Stand   Comments 9 repetitions in 30 seconds, below average for age  Pt. leans onto right LE for this; avoids recently scoped Lt.     Posture/Postural Control   Posture/Postural Control Postural limitations   Postural Limitations Forward head;Rounded Shoulders  significant forward head     ROM / Strength   AROM / PROM / Strength AROM     AROM   Overall AROM Comments Lt. shoulder flexion approx. 90 degrees, abduction approx. 60 degrees and painful, rotation okay; left shrug limited; right shoulder WFL throughout   AROM Assessment Site Cervical   Cervical Flexion WFL   Cervical Extension 50% loss   Cervical - Right Side Bend 25% loss   Cervical - Left Side Bend 25% loss   Cervical - Right Rotation 25% loss   Cervical - Left Rotation WFL     Palpation   Palpation comment indurated at left neck around incision site     Ambulation/Gait   Ambulation/Gait Yes   Ambulation/Gait Assistance 7: Independent           LYMPHEDEMA/ONCOLOGY QUESTIONNAIRE - 03/13/16 1115      Type   Cancer Type left tongue     Surgeries   Other Surgery Date 02/08/16     Treatment   Active Chemotherapy Treatment No  to start 03/16/16   Active Radiation Treatment No  to start 03/15/16     Lymphedema Assessments   Lymphedema Assessments Head and Neck     Head and Neck   4 cm superior to sternal notch around neck 40.7 cm   6 cm superior to sternal notch around neck 40.3 cm   8 cm superior to sternal notch around neck 41 cm   Other 43  at 10 cm. superior to sternal notch                         PT Education - 03/13/16 1117    Education provided Yes   Education Details posture, neck AROM, breathing, walking, Cure article  on staying active, lymphedema and PT info; educated about spinal accessory nerve injury and long recovery time, as well as to do AA flexion with hands clasped to maintain ROM   Person(s) Educated Patient   Methods Explanation;Handout   Comprehension Verbalized understanding                 Head and Neck Clinic Goals - 03/13/16 1125      Patient will be able to verbalize understanding of a home exercise program for cervical range of motion, posture, and walking.    Status Achieved     Patient will be able to verbalize understanding of proper sitting and standing posture.    Status Achieved     Patient will be able to verbalize understanding of lymphedema risk and availability of treatment for this condition.    Status Achieved           Plan - 03/13/16 1118    Clinical Impression Statement This is a pleasant 61 year-old woman five weeks s/p left tongue resection and neck dissection for squamous cell carcinoma.  She is to start chemoradiation later this week.  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 She has some limitation in neck AROM as well, with swelling and induration from surgical incision.  She had below average 30 second sit to stand.  Posture is forward head.  Eval is moderate complexity due to comorbidities of HTN and with h/o right TKA complicated by a fracture and femoral rod placement, and left knee arthroscopic debridement in Sept.; status is evolving as she recovers from surgery and starts chemoradiation.   Rehab Potential Good   PT Frequency One time visit   PT Treatment/Interventions Patient/family education   PT Next Visit Plan None at this time; patient may need therapy if left shoulder remains  limited and/or if lymphedema develops   PT Home Exercise Plan neck ROM, hands clasped bilateral shoulder flexion, posture, breathing, walking   Consulted and Agree with Plan of Care Patient      Patient will benefit from skilled therapeutic intervention in order to improve the following deficits and impairments:  Decreased range of motion, Decreased strength, Impaired UE functional use, Postural dysfunction, Pain  Visit Diagnosis: Abnormal posture - Plan: PT plan of care cert/re-cert  Stiffness of left shoulder, not elsewhere classified - Plan: PT plan of care cert/re-cert  Other symptoms and signs involving the musculoskeletal system - Plan: PT plan of care cert/re-cert     Problem List Patient Active Problem List   Diagnosis Date Noted  . Weight loss 03/08/2016  . Dysarthria 03/08/2016  . Goals of care, counseling/discussion 03/08/2016  . Cancer associated pain 03/07/2016  . 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[place]) 02/08/2016  . Tongue cancer ([place]) 02/08/2016  . 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 OSA on CPAP 01/14/2014  . Snoring 05/06/2013  . Chronic allergic rhinitis 05/06/2013  . Morbid obesity ([place]) 05/06/2013  . Fracture of femur, distal, right, closed ([place]) 02/29/2012  . Osteoarthritis of right knee 12/14/2011    SALISBURY,DONNA 03/13/2016, 11:28 AM  [place], [place], [address] Phone: [phone]   Fax:  [phone]  Name: Tiffany Yu MRN: [id number] Date of Birth: April 17, 1955  Serafina Royals, PT 03/13/16 11:28 AM

## 2016-03-13 NOTE — Progress Notes (Signed)
Patient was seen during Head and Neck Clinic.  61 year old female diagnosed with Tongue Cancer.  Past medical history includes anxiety, depression, hyperlipidemia, and hypertension.  Medications include Xanax, Synthroid, Zofran, and Compazine.  Labs include Albumin of 3.7.  Height: 67.5 inches. Weight: 218.4 pounds UBW: 242 pounds in Feb 2017. BMI: 33.7  Patient noted to have 10% weight loss in less than 2 months  Agrees to PEG on March 15. Patient will be receiving concurrent chemoradiation therapy. Patient reports she only tolerates liquids and soft foods. Patient reports she tolerates milk and other dairy products.  Nutrition diagnosis:  Unintended weight loss related to tongue cancer as evidenced by 10% weight loss in less than 2 months.  Patient meets criteria for severe malnutrition in acute illness secondary to greater than 7.5% weight loss over 3 months and less than or equal to 50% of energy intake for greater than or equal to 5 days.  Intervention: Educated patient to increase calories and protein in small frequent meals and snacks daily. Recommended patient drink 3 oral nutrition supplements daily between meals. Provided samples and coupons. Recommended patient try blenderizing foods to her taste and tolerance. Discouraged constipation, and educated patient on strategies for bowel regimen.  Will review feeding tube regimen after tube placement on March 15. Fact sheets were provided.  Questions were answered.  Teach back method used and contact information given.  Monitoring, evaluation, goals:  Patient will tolerate adequate calories and protein to minimize weight loss and reduce treatment breaks.  Next visit: Friday, March 23, during infusion.  **Disclaimer: This note was dictated with voice recognition software. Similar sounding words can inadvertently be transcribed and this note may contain transcription errors which may not have been corrected upon publication  of note.**

## 2016-03-13 NOTE — Therapy (Signed)
[facility] [address] [place], [place], [address] Phone: [phone]   Fax:  [phone]  Speech Language Pathology Evaluation  Patient Details  Name: Tiffany Yu MRN: [id number] Date of Birth: 10-03-1955 Referring Provider: Eppie Gibson MD  Encounter Date: 03/13/2016      End of Session - 03/13/16 1142    Visit Number 1   Number of Visits 7   Date for SLP Re-Evaluation 09/14/16   SLP Start Time 3267   SLP Stop Time  0930   SLP Time Calculation (min) 52 min      Past Medical History:  Diagnosis Date  . Anxiety   . Arthritis   . Depression    takes ZYban daily  . History of colon polyps    benign  . Hyperlipidemia    takes Atorvastatin daily  . Hypertension    takes HCTZ daily  . Hypothyroidism    takes Synthroid daily  . Insomnia    takes Ambien nightly and Xanax as needed  . Refusal of blood transfusions as patient is Jehovah's Witness     Past Surgical History:  Procedure Laterality Date  . CHOLECYSTECTOMY    . COLONOSCOPY    . DILATION AND CURETTAGE OF UTERUS    . EXCISION OF TONGUE LESION Left 02/08/2016   Procedure: WIDE LOCAL EXCISION OF LEFT LATERAL TONGUE;  Surgeon: Jerrell Belfast, MD;  Location: [facility];  Service: ENT;  Laterality: Left;  . FEMUR IM NAIL Right 02/28/2012   Procedure: OPEN REDUCTION INTERNAL FIXATION (ORIF) RIGHT KNEE;  Surgeon: Alta Corning, MD;  Location: [facility];  Service: Orthopedics;  Laterality: Right;  . KNEE ARTHROPLASTY  12/14/2011   Procedure: COMPUTER ASSISTED TOTAL KNEE ARTHROPLASTY;  Surgeon: Alta Corning, MD;  Location: [facility];  Service: Orthopedics;  Laterality: Right;  . KNEE ARTHROSCOPY     Right  . PLANTAR FASCIA SURGERY     right foot  . RADICAL NECK DISSECTION Left 02/08/2016   Procedure: LEFT SELECTIVE RADICAL NECK DISSECTION;  Surgeon: Jerrell Belfast, MD;  Location: [facility];  Service: ENT;  Laterality: Left;  . TENDON RELEASE     right wrist  . TOTAL  KNEE ARTHROPLASTY  12/14/2011   right knee    There were no vitals filed for this visit.      Subjective Assessment - 03/13/16 0848    Subjective Pt has modified her diet to include pureed food, and liquids "Food that I don't have to chew." Pt also uses gravies with other solid foods.   Currently in Pain? No/denies            SLP Evaluation [facility] - 03/13/16 0848      SLP Visit Information   SLP Received On 03/13/16   Referring Provider Eppie Gibson MD   Onset Date diagnosis January-February 2018   Medical Diagnosis anterior lt tongue SCCA, resection 02-08-16     General Information   HPI Pt is 64 yof with dx of anterior lt lateral tongue SCCA S/P lt neck dissection and lateral tongue resection on 02-08-16. Rad tx begins 03-15-16, chemo begins 03-16-16. Pt with PEG placement Thursday 03-15-16 with modified barium swallow today.      Prior Functional Status   Cognitive/Linguistic Baseline Within functional limits     Cognition   Overall Cognitive Status Within Functional Limits for tasks assessed     Auditory Comprehension   Overall Auditory Comprehension Appears within functional limits for tasks assessed  Verbal Expression   Overall Verbal Expression Appears within functional limits for tasks assessed     Oral Motor/Sensory Function   Overall Oral Motor/Sensory Function Impaired   Labial ROM Reduced left   Labial Symmetry Abnormal symmetry left   Labial Strength Reduced Left   Labial Sensation Reduced Left   Labial Coordination Reduced   Lingual ROM Reduced left   Lingual Symmetry Abnormal symmetry left   Lingual Strength Reduced Left   Lingual Sensation Within Functional Limits   Lingual Coordination Reduced   Overall Oral Motor/Sensory Function Pt with questionable/probable CN involvement, with reduced buccal/labial/facial musculature movement noted. Pt states strength/feeling is slowly improving daily.      Motor Speech   Overall Motor Speech Impaired    Resonance Within functional limits   Articulation Impaired   Level of Impairment Word   Intelligibility Intelligible   Motor Planning Witnin functional limits   Effective Techniques Slow rate;Over-articulate      Pt currently tolerates dys I-II diet with thin liquids, protein drinks.  She has lost weight in the last 6 months. Pt has dentures, however today is wearing only top plate. Spontaneous cough/throat clear is WNL. No overt s/s aspiration are reported with food textures pt is currently consuming. She has modified her diet (no meats, tough, or dry items) due to changes in her oral musculature over the last approx 2 months. She has incorporated gravies into her diet with more particulate items such as rice and states she wants to try stew beef "cooked for a long long time, with some gravy." Difficulty with meds has been mostly alleviated with use of applesauce to assist with oral transit. Pt has modified barium swallow eval today and SLP educated pt on the basics of that exam.  POs: Pt ate applesauce and drank water without overt s/s aspiration. Thyroid elevation appeared adequate, and swallows appeared timely. Oral residue noted as minimal. Pt's swallow deemed WFL at this time.   Because data states the risk for dysphagia during and after radiation treatment is high due to undergoing radiation tx, SLP taught pt about the possibility of reduced/limited ability for PO intake during rad tx. SLP encouraged pt to continue swallowing POs as far into rad tx as possible, even ingesting POs and/or completing HEP shortly after administration of pain meds.   SLP educated pt re: changes to swallowing musculature after rad tx, and why adherence to dysphagia HEP provided today and PO consumption was necessary to inhibit muscular disuse atrophy and to reduce muscle fibrosis following rad tx. Pt demonstrated understanding of these things to SLP.    SLP then developed a HEP for pt and pt was instructed how to  perform exercises involving lingual, vocal, and pharyngeal strengthening. SLP performed each exercise and pt return demonstrated each exercise. SLP ensured pt performance was correct prior to moving on to next exercise. Pt was instructed to complete this program 2-3 times a day, until 6 months after his or her last rad tx, then x2 a week after that.                    SLP Education - 03/13/16 1142    Education provided Yes   Education Details HEP, late effects head/neck radiation, nature of modified barium swallow exam   Person(s) Educated Patient   Methods Explanation;Demonstration;Handout;Tactile cues   Comprehension Verbalized understanding;Returned demonstration;Verbal cues required;Need further instruction          SLP Short Term Goals - 03/13/16 1146  SLP SHORT TERM GOAL #1   Title pt will demo HEP with rare min A over two sessions   Time 3   Period --  visits   Status New     SLP SHORT TERM GOAL #2   Title pt will tell SLP why she is completing HEP   Time 2   Period --  vists   Status New     SLP SHORT TERM GOAL #3   Title pt will tell SLP how long she has to complete HEP x2-3/day (until 6 months after last rad day)   Time 2   Period --  visits   Status New          SLP Long Term Goals - 03/13/16 1203      SLP LONG TERM GOAL #1   Title pt will complete HEP with rare min A over two sessions   Time 6   Period --  visits   Status New     SLP LONG TERM GOAL #2   Title pt will tell how a food journal can assist pt in returning to more regular texture foods   Time 6   Period --  visits   Status New     SLP LONG TERM GOAL #3   Title pt will not exhibit overt signs/symptoms of oropharyngeal dysphagia over two sessions following completion of rad tx   Time 6   Period --  visits   Status New          Plan - 03/13/16 1143    Clinical Impression Statement Pt presents with swallowing WFL, with noted lt labial leakage rarely with  liquids. Mild dysartrhria is noted, however speech intelligibility remains WFL/WNL. Pt with weakened lt oral musculature but with pureed and thin pt was without overt s/s of aspiration today. Pt has self-regulated POs to pureed foods and/or liquids (high protein drinks). No overt s/s aspiration PNA today however pt has lost weight in last 6 months.    Speech Therapy Frequency --  once approx every four weeks   Duration --  6 visits   Treatment/Interventions Aspiration precaution training;Pharyngeal strengthening exercises;Diet toleration management by SLP;Compensatory techniques;Internal/external aids;SLP instruction and feedback;Patient/family education;Compensatory strategies;Trials of upgraded texture/liquids;Cueing hierarchy  any or all may be used   Potential to Achieve Goals Good      Patient will benefit from skilled therapeutic intervention in order to improve the following deficits and impairments:   Dysphagia, unspecified type  Dysarthria and Anarthria    Problem List Patient Active Problem List   Diagnosis Date Noted  . Weight loss 03/08/2016  . Dysarthria 03/08/2016  . Goals of care, counseling/discussion 03/08/2016  . Cancer associated pain 03/07/2016  . 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[place]) 02/08/2016  . Tongue cancer ([place]) 02/08/2016  . 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 OSA on CPAP 01/14/2014  . Snoring 05/06/2013  . Chronic allergic rhinitis 05/06/2013  . Morbid obesity ([place]) 05/06/2013  . Fracture of femur, distal, right, closed ([place]) 02/29/2012  . Osteoarthritis of right knee 12/14/2011    [facility] ,[place], CCC-SLP  03/13/2016, 12:06 PM  [facility] [address] [place], [place], [address] Phone: [phone]   Fax:  [phone]  Name: ELLYSE ROTOLO MRN: [id number] Date of Birth: 1955-11-16

## 2016-03-13 NOTE — Progress Notes (Signed)
Financial Counseling--Spoke with patient today--gave her information about the [employer], and answered questions regarding her insurance--gave her my card to call me if she has any more concerns

## 2016-03-14 ENCOUNTER — Other Ambulatory Visit: Payer: Self-pay | Admitting: Radiology

## 2016-03-15 ENCOUNTER — Ambulatory Visit (HOSPITAL_COMMUNITY)
Admission: RE | Admit: 2016-03-15 | Discharge: 2016-03-15 | Disposition: A | Discharge status: Home / Self Care | Payer: 59 | Source: Ambulatory Visit | Attending: Hematology and Oncology | Admitting: Hematology and Oncology

## 2016-03-15 ENCOUNTER — Other Ambulatory Visit: Payer: Self-pay | Admitting: Hematology and Oncology

## 2016-03-15 ENCOUNTER — Encounter (HOSPITAL_COMMUNITY): Payer: Self-pay

## 2016-03-15 ENCOUNTER — Ambulatory Visit
Admission: RE | Admit: 2016-03-15 | Discharge: 2016-03-15 | Disposition: A | Discharge status: Home / Self Care | Payer: 59 | Source: Ambulatory Visit | Attending: Radiation Oncology | Admitting: Radiation Oncology

## 2016-03-15 ENCOUNTER — Encounter: Payer: Self-pay | Admitting: *Deleted

## 2016-03-15 ENCOUNTER — Ambulatory Visit (HOSPITAL_COMMUNITY)
Admission: RE | Admit: 2016-03-15 | Discharge: 2016-03-15 | Disposition: A | Discharge status: Home / Self Care | Payer: 59 | Source: Ambulatory Visit | Attending: Radiation Oncology | Admitting: Radiation Oncology

## 2016-03-15 DIAGNOSIS — F419 Anxiety disorder, unspecified: Secondary | ICD-10-CM | POA: Insufficient documentation

## 2016-03-15 DIAGNOSIS — M1990 Unspecified osteoarthritis, unspecified site: Secondary | ICD-10-CM | POA: Diagnosis not present

## 2016-03-15 DIAGNOSIS — Z510 Encounter for antineoplastic radiation therapy: Secondary | ICD-10-CM | POA: Diagnosis not present

## 2016-03-15 DIAGNOSIS — F329 Major depressive disorder, single episode, unspecified: Secondary | ICD-10-CM | POA: Insufficient documentation

## 2016-03-15 DIAGNOSIS — C023 Malignant neoplasm of anterior two-thirds of tongue, part unspecified: Secondary | ICD-10-CM

## 2016-03-15 DIAGNOSIS — G4700 Insomnia, unspecified: Secondary | ICD-10-CM | POA: Insufficient documentation

## 2016-03-15 DIAGNOSIS — E785 Hyperlipidemia, unspecified: Secondary | ICD-10-CM | POA: Diagnosis not present

## 2016-03-15 DIAGNOSIS — E039 Hypothyroidism, unspecified: Secondary | ICD-10-CM | POA: Diagnosis not present

## 2016-03-15 DIAGNOSIS — C021 Malignant neoplasm of border of tongue: Secondary | ICD-10-CM

## 2016-03-15 DIAGNOSIS — C029 Malignant neoplasm of tongue, unspecified: Secondary | ICD-10-CM | POA: Insufficient documentation

## 2016-03-15 DIAGNOSIS — Z8249 Family history of ischemic heart disease and other diseases of the circulatory system: Secondary | ICD-10-CM | POA: Diagnosis not present

## 2016-03-15 DIAGNOSIS — I10 Essential (primary) hypertension: Secondary | ICD-10-CM | POA: Diagnosis not present

## 2016-03-15 HISTORY — PX: IR GENERIC HISTORICAL: IMG1180011

## 2016-03-15 LAB — CBC
HCT: 39.7 % (ref 36.0–46.0)
HEMOGLOBIN: 14.4 g/dL (ref 12.0–15.0)
MCH: 28.7 pg (ref 26.0–34.0)
MCHC: 36.3 g/dL — AB (ref 30.0–36.0)
MCV: 79.2 fL (ref 78.0–100.0)
Platelets: 268 10*3/uL (ref 150–400)
RBC: 5.01 MIL/uL (ref 3.87–5.11)
RDW: 15.5 % (ref 11.5–15.5)
WBC: 9.5 10*3/uL (ref 4.0–10.5)

## 2016-03-15 LAB — APTT: APTT: 34 s (ref 24–36)

## 2016-03-15 LAB — DIFFERENTIAL
BASOS ABS: 0.1 10*3/uL (ref 0.0–0.1)
BASOS PCT: 1 %
Eosinophils Absolute: 0.3 10*3/uL (ref 0.0–0.7)
Eosinophils Relative: 3 %
LYMPHS ABS: 2.2 10*3/uL (ref 0.7–4.0)
LYMPHS PCT: 24 %
MONO ABS: 0.8 10*3/uL (ref 0.1–1.0)
MONOS PCT: 9 %
NEUTROS ABS: 6 10*3/uL (ref 1.7–7.7)
Neutrophils Relative %: 63 %

## 2016-03-15 LAB — PROTIME-INR
INR: 1.04
PROTHROMBIN TIME: 13.6 s (ref 11.4–15.2)

## 2016-03-15 MED ORDER — OXYCODONE-ACETAMINOPHEN 5-325 MG PO TABS
2.0000 | ORAL_TABLET | ORAL | Status: AC
  Administered 2016-03-15: 2 via ORAL
  Filled 2016-03-15: qty 2

## 2016-03-15 MED ORDER — GLUCAGON HCL RDNA (DIAGNOSTIC) 1 MG IJ SOLR
INTRAMUSCULAR | Status: AC | PRN
  Administered 2016-03-15: 1 mg via INTRAVENOUS

## 2016-03-15 MED ORDER — CEFAZOLIN SODIUM-DEXTROSE 2-4 GM/100ML-% IV SOLN
INTRAVENOUS | Status: AC
  Administered 2016-03-15: 2 g via INTRAVENOUS
  Filled 2016-03-15: qty 100

## 2016-03-15 MED ORDER — HEPARIN SOD (PORK) LOCK FLUSH 100 UNIT/ML IV SOLN
INTRAVENOUS | Status: AC
  Filled 2016-03-15: qty 5

## 2016-03-15 MED ORDER — MIDAZOLAM HCL 2 MG/2ML IJ SOLN
INTRAMUSCULAR | Status: AC
  Filled 2016-03-15: qty 6

## 2016-03-15 MED ORDER — CEFAZOLIN (ANCEF) 1 G IV SOLR
INTRAVENOUS | Status: AC | PRN
  Administered 2016-03-15: 2 g

## 2016-03-15 MED ORDER — MIDAZOLAM HCL 2 MG/2ML IJ SOLN
INTRAMUSCULAR | Status: AC
  Filled 2016-03-15: qty 4

## 2016-03-15 MED ORDER — IOPAMIDOL (ISOVUE-300) INJECTION 61%
INTRAVENOUS | Status: AC
  Filled 2016-03-15: qty 50

## 2016-03-15 MED ORDER — LIDOCAINE-EPINEPHRINE (PF) 2 %-1:200000 IJ SOLN
INTRAMUSCULAR | Status: AC | PRN
  Administered 2016-03-15: 20 mL

## 2016-03-15 MED ORDER — LIDOCAINE-EPINEPHRINE (PF) 2 %-1:200000 IJ SOLN
INTRAMUSCULAR | Status: AC
  Filled 2016-03-15: qty 20

## 2016-03-15 MED ORDER — MIDAZOLAM HCL 2 MG/2ML IJ SOLN
INTRAMUSCULAR | Status: AC | PRN
  Administered 2016-03-15 (×7): 1 mg via INTRAVENOUS

## 2016-03-15 MED ORDER — HEPARIN SOD (PORK) LOCK FLUSH 100 UNIT/ML IV SOLN
INTRAVENOUS | Status: AC | PRN
  Administered 2016-03-15: 500 [IU]

## 2016-03-15 MED ORDER — CEFAZOLIN SODIUM-DEXTROSE 2-4 GM/100ML-% IV SOLN
2.0000 g | Freq: Once | INTRAVENOUS | Status: AC
  Administered 2016-03-15: 2 g via INTRAVENOUS

## 2016-03-15 MED ORDER — FENTANYL CITRATE (PF) 100 MCG/2ML IJ SOLN
INTRAMUSCULAR | Status: AC
  Filled 2016-03-15: qty 6

## 2016-03-15 MED ORDER — FENTANYL CITRATE (PF) 100 MCG/2ML IJ SOLN
INTRAMUSCULAR | Status: AC | PRN
  Administered 2016-03-15 (×3): 50 ug via INTRAVENOUS
  Administered 2016-03-15 (×3): 25 ug via INTRAVENOUS

## 2016-03-15 MED ORDER — LIDOCAINE HCL 1 % IJ SOLN
INTRAMUSCULAR | Status: AC
  Filled 2016-03-15: qty 20

## 2016-03-15 MED ORDER — HYDROMORPHONE HCL 1 MG/ML IJ SOLN: INTRAMUSCULAR | Status: AC | PRN

## 2016-03-15 MED ORDER — GLUCAGON HCL RDNA (DIAGNOSTIC) 1 MG IJ SOLR
INTRAMUSCULAR | Status: AC
  Filled 2016-03-15: qty 1

## 2016-03-15 MED ORDER — SODIUM CHLORIDE 0.9 % IV SOLN
INTRAVENOUS | Status: DC
  Administered 2016-03-15: 13:00:00 via INTRAVENOUS

## 2016-03-15 NOTE — Discharge Instructions (Signed)
Implanted Port Home Guide °An implanted port is a type of central line that is placed under the skin. Central lines are used to provide IV access when treatment or nutrition needs to be given through a person’s veins. Implanted ports are used for long-term IV access. An implanted port may be placed because: °· You need IV medicine that would be irritating to the small veins in your hands or arms. °· You need long-term IV medicines, such as antibiotics. °· You need IV nutrition for a long period. °· You need frequent blood draws for lab tests. °· You need dialysis. °Implanted ports are usually placed in the chest area, but they can also be placed in the upper arm, the abdomen, or the leg. An implanted port has two main parts: °· Reservoir. The reservoir is round and will appear as a small, raised area under your skin. The reservoir is the part where a needle is inserted to give medicines or draw blood. °· Catheter. The catheter is a thin, flexible tube that extends from the reservoir. The catheter is placed into a large vein. Medicine that is inserted into the reservoir goes into the catheter and then into the vein. °How will I care for my incision site? °Do not get the incision site wet. Bathe or shower as directed by your health care provider. °How is my port accessed? °Special steps must be taken to access the port: °· Before the port is accessed, a numbing cream can be placed on the skin. This helps numb the skin over the port site. °· Your health care provider uses a sterile technique to access the port. °¨ Your health care provider must put on a mask and sterile gloves. °¨ The skin over your port is cleaned carefully with an antiseptic and allowed to dry. °¨ The port is gently pinched between sterile gloves, and a needle is inserted into the port. °· Only "non-coring" port needles should be used to access the port. Once the port is accessed, a blood return should be checked. This helps ensure that the port is  in the vein and is not clogged. °· If your port needs to remain accessed for a constant infusion, a clear (transparent) bandage will be placed over the needle site. The bandage and needle will need to be changed every week, or as directed by your health care provider. °· Keep the bandage covering the needle clean and dry. Do not get it wet. Follow your health care provider’s instructions on how to take a shower or bath while the port is accessed. °· If your port does not need to stay accessed, no bandage is needed over the port. °What is flushing? °Flushing helps keep the port from getting clogged. Follow your health care provider’s instructions on how and when to flush the port. Ports are usually flushed with saline solution or a medicine called heparin. The need for flushing will depend on how the port is used. °· If the port is used for intermittent medicines or blood draws, the port will need to be flushed: °¨ After medicines have been given. °¨ After blood has been drawn. °¨ As part of routine maintenance. °· If a constant infusion is running, the port may not need to be flushed. °How long will my port stay implanted? °The port can stay in for as long as your health care provider thinks it is needed. When it is time for the port to come out, surgery will be done to remove it.   The procedure is similar to the one performed when the port was put in. When should I seek immediate medical care? When you have an implanted port, you should seek immediate medical care if:  You notice a bad smell coming from the incision site.  You have swelling, redness, or drainage at the incision site.  You have more swelling or pain at the port site or the surrounding area.  You have a fever that is not controlled with medicine. This information is not intended to replace advice given to you by your health care provider. Make sure you discuss any questions you have with your health care provider. Document Released:  12/18/2004 Document Revised: 05/26/2015 Document Reviewed: 08/25/2012 Elsevier Interactive Patient Education  2017 [place].  Gastrostomy Tube Replacement, Care After Refer to this sheet in the next few weeks. These instructions provide you with information on caring for yourself after your procedure. Your health care provider may also give you more specific instructions. Your treatment has been planned according to current medical practices, but problems sometimes occur. Call your health care provider if you have any problems or questions after your procedure. What can I expect after the procedure? After your procedure, it is typical to have the following:  Mild abdominal pain.  A small amount of blood-tinged fluid leaking from the replacement site. Follow these instructions at home:  You may resume your normal level of activity.  You may resume your normal feedings.  Care for your gastrostomy tube as you did before, or as directed by your health care provider. Contact a health care provider if:  You have a fever or chills.  You have redness or irritation near the insertion site.  You continue to have abdominal pain or leaking around your gastrostomy tube. Get help right away if:  You develop bleeding or significant discharge around the tube.  You have severe abdominal pain.  Your new tube does not seem to be working properly.  You are unable to get feedings into the tube.  Your tube comes out for any reason. This information is not intended to replace advice given to you by your health care provider. Make sure you discuss any questions you have with your health care provider. Document Released: 07/15/2013 Document Revised: 05/26/2015 Document Reviewed: 04/29/2013 Elsevier Interactive Patient Education  2017 [place].  Moderate Conscious Sedation, Adult, Care After These instructions provide you with information about caring for yourself after your procedure. Your  health care provider may also give you more specific instructions. Your treatment has been planned according to current medical practices, but problems sometimes occur. Call your health care provider if you have any problems or questions after your procedure. What can I expect after the procedure? After your procedure, it is common:  To feel sleepy for several hours.  To feel clumsy and have poor balance for several hours.  To have poor judgment for several hours.  To vomit if you eat too soon. Follow these instructions at home: For at least 24 hours after the procedure:    Do not:  Participate in activities where you could fall or become injured.  Drive.  Use heavy machinery.  Drink alcohol.  Take sleeping pills or medicines that cause drowsiness.  Make important decisions or sign legal documents.  Take care of children on your own.  Rest. Eating and drinking   Follow the diet recommended by your health care provider.  If you vomit:  Drink water, juice, or soup when you  can drink without vomiting.  Make sure you have little or no nausea before eating solid foods. General instructions   Have a responsible adult stay with you until you are awake and alert.  Take over-the-counter and prescription medicines only as told by your health care provider.  If you smoke, do not smoke without supervision.  Keep all follow-up visits as told by your health care provider. This is important. Contact a health care provider if:  You keep feeling nauseous or you keep vomiting.  You feel light-headed.  You develop a rash.  You have a fever. Get help right away if:  You have trouble breathing. This information is not intended to replace advice given to you by your health care provider. Make sure you discuss any questions you have with your health care provider. Document Released: 10/08/2012 Document Revised: 05/23/2015 Document Reviewed: 04/09/2015 Elsevier Interactive  Patient Education  2017 [employer].

## 2016-03-15 NOTE — Sedation Documentation (Signed)
Patient is resting comfortably. 

## 2016-03-15 NOTE — Consult Note (Signed)
Chief Complaint: Patient was seen in consultation today for Port-A-Cath and percutaneous gastrostomy tube placements  Referring Physician(s): Gorsuch,Ni  Supervising Physician: Sandi Mariscal  Patient Status: [facility] - Out-pt  History of Present Illness: Tiffany Yu is a 61 y.o. 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  Past Medical History:  Diagnosis Date  . Anxiety   . Arthritis   . Depression    takes ZYban daily  . History of colon polyps    benign  . Hyperlipidemia    takes Atorvastatin daily  . Hypertension    takes HCTZ daily  . Hypothyroidism    takes Synthroid daily  . Insomnia    takes Ambien nightly and Xanax as needed  . Refusal of blood transfusions as patient is Jehovah's Witness     Past Surgical History:  Procedure Laterality Date  . CHOLECYSTECTOMY    . COLONOSCOPY    . DILATION AND CURETTAGE OF UTERUS    . EXCISION OF TONGUE LESION Left 02/08/2016   Procedure: WIDE LOCAL EXCISION OF LEFT LATERAL TONGUE;  Surgeon: Jerrell Belfast, MD;  Location: [place];  Service: ENT;  Laterality: Left;  . FEMUR IM NAIL Right 02/28/2012   Procedure: OPEN REDUCTION INTERNAL FIXATION (ORIF) RIGHT KNEE;  Surgeon: Alta Corning, MD;  Location: [place];  Service: Orthopedics;  Laterality: Right;  . KNEE ARTHROPLASTY  12/14/2011   Procedure: COMPUTER ASSISTED TOTAL KNEE ARTHROPLASTY;  Surgeon: Alta Corning, MD;  Location: [place];  Service: Orthopedics;  Laterality: Right;  . KNEE ARTHROSCOPY     Right  . PLANTAR FASCIA SURGERY     right foot  . RADICAL NECK DISSECTION Left 02/08/2016   Procedure: LEFT SELECTIVE RADICAL NECK DISSECTION;  Surgeon: Jerrell Belfast, MD;  Location: [place];  Service: ENT;  Laterality: Left;  . TENDON RELEASE     right wrist  . TOTAL KNEE ARTHROPLASTY   12/14/2011   right knee    Allergies: Patient has no known allergies.  Medications: Prior to Admission medications   Medication Sig Start Date End Date Taking? Authorizing Provider  ALPRAZolam Duanne Moron) 0.5 MG tablet Take 0.5 mg by mouth at bedtime as needed for sleep.    Yes Historical Provider, MD  atorvastatin (LIPITOR) 10 MG tablet Take 10 mg by mouth daily at 6 PM.    Yes Historical Provider, MD  buPROPion (ZYBAN) 150 MG 12 hr tablet Take 150 mg by mouth daily.    Yes Historical Provider, MD  chlorhexidine (PERIDEX) 0.12 % solution Rinse with 15 mls three times daily for 30 seconds. Use after breakfast, dinner, and at bedtime. Spit out excess. Do not swallow. 02/28/16  Yes Lenn Cal, DDS  hydrochlorothiazide (MICROZIDE) 12.5 MG capsule Take 12.5 mg by mouth daily.  12/02/13 03/15/16 Yes Historical Provider, MD  HYDROcodone-acetaminophen (HYCET) 7.5-325 mg/15 ml solution Take 10-15 mLs by mouth every 4 (four) hours as needed for moderate pain. 02/08/16  Yes Jerrell Belfast, MD  levothyroxine (SYNTHROID, LEVOTHROID) 75 MCG tablet Take 75 mcg by mouth daily before breakfast.  11/11/13  Yes Historical Provider, MD  zolpidem (AMBIEN) 10 MG tablet Take 10 mg by mouth at bedtime.    Yes Historical Provider, MD  fentaNYL (DURAGESIC - DOSED MCG/HR) 25 MCG/HR patch Place 1 patch (25 mcg total) onto the skin every 3 (three) days. 03/07/16  [name], MD  lidocaine-prilocaine (EMLA) cream Apply to affected area once 03/07/16   [name], MD  Morphine Sulfate (MORPHINE CONCENTRATE) 10 mg / 0.5 ml concentrated solution Take 0.5 mLs (10 mg total) by mouth every 2 (two) hours as needed for severe pain. 03/07/16   [name], MD  naproxen sodium (ANAPROX) 220 MG tablet Take 220 mg by mouth 2 (two) times daily as needed (pain).    Historical Provider, MD  ondansetron (ZOFRAN) 8 MG tablet Take 1 tablet (8 mg total) by mouth 2 (two) times daily as needed. Start on the third day after chemotherapy. 03/07/16   [name], MD  prochlorperazine (COMPAZINE) 10 MG tablet Take 1 tablet (10 mg total) by mouth every 6 (six) hours as needed (Nausea or vomiting). 03/07/16   [name], MD  triamcinolone (KENALOG) 0.1 % paste APPLY TO AFFECTED AREA 01/24/16   Historical Provider, MD     Family History  Problem Relation Age of Onset  . Anemia Mother   . Hypertension Mother   . Heart attack Father     Social History   Social History  . Marital status: Married    Spouse name: Ebony Hail  . Number of children: 2  . Years of education: College   Occupational History  . CUSTOMER SERVICE REP  At [employer]   Social History Main Topics  . Smoking status: Never Smoker  . Smokeless tobacco: Never Used  . Alcohol use Yes     Comment: very rarely  . Drug use: No  . Sexual activity: Not Asked   Other Topics Concern  . None   Social History Narrative   Patient is married Warden/ranger) and lives at home with her husband.   Patient has a college education.   Patient drinks two sodas per day.   Patient has two children.   Patient is right-handed.           Review of Systems denies fever, headache, chest pain, dyspnea, cough, abdominal/back pain, nausea, vomiting or abnormal bleeding. She has had weight loss.   Vital Signs: Blood pressure 149/106, heart rate 108, temperature 98, respirations 18, O2 sat 100% room air    Physical Exam  Awake/alert; Chest clear to auscultation bilaterally. Heart with regular rate and rhythm. Abdomen obese, soft, positive bowel sounds, nontender. Lower extremities -no edema; dysarthria noted secondary to recent hemiglossectomy.  Mallampati Score:     Imaging: Ct Soft Tissue Neck W Contrast  Result Date: 03/02/2016 CLINICAL DATA:  61 year old female with squamous cell carcinoma along the entire left lateral tongue. Status post surgical resection, left hemiglossectomy and selective neck dissection (zones 1 through 3). Subsequent encounter. EXAM: CT NECK WITH CONTRAST TECHNIQUE:  Multidetector CT imaging of the neck was performed using the standard protocol following the bolus administration of intravenous contrast. CONTRAST:  26mL ISOVUE-300 IOPAMIDOL (ISOVUE-300) INJECTION 61% in conjunction with contrast enhanced imaging of the chest reported separately. COMPARISON:  Pretreatment neck CT 01/05/2016 FINDINGS: Pharynx and larynx: Motion artifact at the subglottic larynx. Laryngeal soft tissue contours appear within normal limits. Pharyngeal soft tissue contours are within normal limits. Parapharyngeal and retropharyngeal spaces appear normal. Salivary glands: The left submandibular gland appears to be surgically absent, with an indistinct area of generalized soft tissue thickening which extends from the posterior left sublingual space/anterior submandibular space posteriorly to the left carotid space. See series 5, image 41. This was the same region of the of the left level 2/3 malignant 17 mm lymph node on  the pretreatment study. The postoperative soft tissue thickening extends cephalad to the inferior parotid space and caudally to the level of the thyroid cartilage. The soft tissue changes are inseparable from the anterior residual left sternocleidomastoid muscle. There is mild soft tissue thickening and asymmetry along the residual left posterolateral oral tongue near the left retromolar trigone as seen on series 5, image 30. This is near the area of the tongue mass delineated on the pretreatment CT. The anterior sublingual space, the right submandibular space and the right submandibular gland are stable and within normal limits. See right level 1 lymph node findings below. Aside from the postoperative changes to the inferior left parotid space the parotid glands are stable and within normal limits. Thyroid: Stable, negative. Lymph nodes: Abnormal right level 1B lymph node is rounded and measures 9-10 mm short axis now (series 5, image 45), versus 4 mm or smaller in January. Small level  1a lymph nodes remain normal on series 5, image 51. The left level 1B, level 2, and level 3a nodal stations are obscured by the confluent soft tissue described above (series 5, image 41). Small level IIIb lymph nodes remain normal (i.e. Series 5, image 44). Right level 2, right level 3, bilateral level 4 and bilateral level 5 nodes are stable and within normal limits. Vascular: Major vascular structures in the neck and at the skullbase remain patent, including the left internal jugular vein which is diminutive. The postoperative confluent soft tissue on the left does involve the left carotid space, but there is no mass effect on the left carotid. Limited intracranial: Negative. Visualized orbits: Negative. Mastoids and visualized paranasal sinuses: Visualized paranasal sinuses and mastoids are stable and well pneumatized. Skeleton: No acute osseous abnormality identified. Mandible is intact. Upper chest: Chest CT today reported separately. IMPRESSION: 1. Newly enlarged and round 10 mm right level 1b lymph node (series 5, image 45). The level 1A nodes remain normal. This right 1 B node is nonspecific but suspicious for progressive nodal metastatic disease. It might be amenable to Ultrasound-guided FNA. 2. Interval left hemiglossectomy and selective left neck dissection with confluent indeterminate soft tissue from the anterior left submandibular space through the left carotid space and obscuring the left level 1B, level 2, and level IIIa nodal stations. This study will serve as a new postoperative baseline of this area. Attention directed on close interval follow-up. 3. Mild soft tissue thickening also along the left lateral tongue resection margin. Attention directed on followup. 4. Chest CT today reported separately. Electronically Signed   By: Genevie Ann M.D.   On: 03/02/2016 16:05   Ct Chest W Contrast  Result Date: 03/02/2016 CLINICAL DATA:  Left lateral tongue squamous cell carcinoma status post partial left  glossectomy with neck dissection 02/08/2016. 3 of 20 regional lymph nodes in the neck positive for metastatic disease. Chest staging. EXAM: CT CHEST WITH CONTRAST TECHNIQUE: Multidetector CT imaging of the chest was performed during intravenous contrast administration. CONTRAST:  5mL ISOVUE-300 IOPAMIDOL (ISOVUE-300) INJECTION 61% COMPARISON:  05/24/2015 chest CT angiogram. FINDINGS: Cardiovascular: Normal heart size. No significant pericardial fluid/thickening. Atherosclerotic nonaneurysmal thoracic aorta (maximum diameter of the ascending thoracic aorta 3.7 cm, stable). Normal caliber pulmonary arteries. No central pulmonary emboli. Mediastinum/Nodes: Stable hypodense 0.8 cm left thyroid lobe nodule. Unremarkable esophagus. No pathologically enlarged axillary, mediastinal or hilar lymph nodes. Lungs/Pleura: No pneumothorax. No pleural effusion. Subpleural superior segment right lower lobe 3 mm solid pulmonary nodule (series 8/ image 54), subpleural 3 mm anterior right middle lobe  pulmonary nodule associated with the minor fissure (series 8/ image 74), subpleural anterior right lower lobe 3 mm solid pulmonary nodule (series 8/ image 88) and subpleural 3 mm basilar left lower lobe solid pulmonary nodule (series 8/ image 117) are all stable back to at least 02/03/2012 and considered benign. No acute consolidative airspace disease, lung masses or additional significant pulmonary nodules. Upper abdomen: Cholecystectomy. Hypodense subcentimeter anterior upper right renal cortical lesion is too small to characterize and not appreciably changed back to the 02/03/2012, most consistent with a benign renal cyst. Musculoskeletal: No aggressive appearing focal osseous lesions. Mild thoracic spondylosis. IMPRESSION: 1. No evidence of metastatic disease in the chest. 2. Aortic atherosclerosis. Electronically Signed   By: Ilona Sorrel M.D.   On: 03/02/2016 16:54   Dg Swallowing Func-speech Pathology  Result Date:  03/13/2016 Objective Swallowing Evaluation: Type of Study: MBS-Modified Barium Swallow Study Patient Details Name: Tiffany Yu MRN: [id number] Date of Birth: November 29, 1955 Today's Date: 03/13/2016 Time: SLP Start Time (ACUTE ONLY): 1343-SLP Stop Time (ACUTE ONLY): 1410 SLP Time Calculation (min) (ACUTE ONLY): 27 min Past Medical History: Past Medical History: Diagnosis Date . Anxiety  . Arthritis  . Depression   takes ZYban daily . History of colon polyps   benign . Hyperlipidemia   takes Atorvastatin daily . Hypertension   takes HCTZ daily . Hypothyroidism   takes Synthroid daily . Insomnia   takes Ambien nightly and Xanax as needed . Refusal of blood transfusions as patient is Jehovah's Witness  Past Surgical History: Past Surgical History: Procedure Laterality Date . CHOLECYSTECTOMY   . COLONOSCOPY   . DILATION AND CURETTAGE OF UTERUS   . EXCISION OF TONGUE LESION Left 02/08/2016  Procedure: WIDE LOCAL EXCISION OF LEFT LATERAL TONGUE;  Surgeon: Jerrell Belfast, MD;  Location: [place];  Service: ENT;  Laterality: Left; . FEMUR IM NAIL Right 02/28/2012  Procedure: OPEN REDUCTION INTERNAL FIXATION (ORIF) RIGHT KNEE;  Surgeon: Alta Corning, MD;  Location: [place];  Service: Orthopedics;  Laterality: Right; . KNEE ARTHROPLASTY  12/14/2011  Procedure: COMPUTER ASSISTED TOTAL KNEE ARTHROPLASTY;  Surgeon: Alta Corning, MD;  Location: [place];  Service: Orthopedics;  Laterality: Right; . KNEE ARTHROSCOPY    Right . PLANTAR FASCIA SURGERY    right foot . RADICAL NECK DISSECTION Left 02/08/2016  Procedure: LEFT SELECTIVE RADICAL NECK DISSECTION;  Surgeon: Jerrell Belfast, MD;  [facility];  Service: ENT;  Laterality: Left; . TENDON RELEASE    right wrist . TOTAL KNEE ARTHROPLASTY  12/14/2011  right knee HPI: Pt is 32 yof with dx of anterior lt lateral tongue SCCA S/P lt neck dissection and lateral tongue resection on 02-08-16. Rad tx begins 03-15-16, chemo begins 03-16-16. Pt with PEG placement Thursday 03-15-16.  Pt reports improved  swallow/speech since recovery from surgery.   Subjective: pt ambulated easily into xray department, friendly Assessment / Plan / Recommendation CHL IP CLINICAL IMPRESSIONS 03/13/2016 Clinical Impression Pt presents with mild oral dysphagia due to structural changes and ? 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  Oral difficultlies characterized by oral discoordination/transiting difficulties.  She compensates well by piecemealing and placing boluses on right side of oral cavity.  Pharyngeal swallow was strong with only occasional minimal residuals of thin liquids.  No aspiration or penetration noted.  Barium tablet taken with pudding after inability to orally transit with thin transited pharynx easily.  Tablet appeared to lodge in mid=esophagus with initial referrant sensastion to pharynx - that abated with  pt reflexively swallowing.  Pt appeared with backflow of liquids at mid=esophagus when taken to clear tablet - without sensation.  Advised pt to assure adequate liquid intake with meds with puree.  Encouraged pt to continue po as tolerated during XRT and provided written handout for xerostomia, possible radiation side effectis.  Provided pt with syringe to use for liquid adminiistration/oral care as XRT progressed.  Thanks for this referral.  SLP Visit Diagnosis Dysphagia, oral phase (R13.11) Attention and concentration deficit following -- Frontal lobe and executive function deficit following -- Impact on safety and function Mild aspiration risk     CHL IP DIET RECOMMENDATION 03/13/2016 SLP Diet Recommendations Dysphagia 3 (Mech soft) solids;Thin liquid Liquid Administration via Cup;Straw Medication Administration Whole meds with puree Compensations Slow rate;Small sips/bites;Lingual sweep for clearance of pocketing Postural Changes Remain semi-upright after after feeds/meals (Comment);Seated upright at 90 degrees   CHL IP OTHER RECOMMENDATIONS 03/13/2016 Recommended Consults (No  Data) Oral Care Recommendations Oral care BID Other Recommendations --   CHL IP FOLLOW UP RECOMMENDATIONS 03/13/2016 Follow up Recommendations (No Data)   No flowsheet data found.     CHL IP ORAL PHASE 03/13/2016 Oral Phase Impaired Oral - Pudding Teaspoon -- Oral - Pudding Cup -- Oral - Honey Teaspoon -- Oral - Honey Cup -- Oral - Nectar Teaspoon -- Oral - Nectar Cup Weak lingual manipulation;Reduced posterior propulsion;Piecemeal swallowing;Premature spillage Oral - Nectar Straw -- Oral - Thin Teaspoon -- Oral - Thin Cup Weak lingual manipulation;Reduced posterior propulsion;Piecemeal swallowing;Delayed oral transit;Decreased bolus cohesion;Premature spillage Oral - Thin Straw Weak lingual manipulation;Reduced posterior propulsion;Piecemeal swallowing;Decreased bolus cohesion;Premature spillage Oral - Puree Weak lingual manipulation;Lingual pumping;Reduced posterior propulsion;Piecemeal swallowing;Delayed oral transit;Premature spillage Oral - Mech Soft Impaired mastication;Weak lingual manipulation;Reduced posterior propulsion;Piecemeal swallowing;Delayed oral transit Oral - Regular -- Oral - Multi-Consistency -- Oral - Pill Weak lingual manipulation;Reduced posterior propulsion;Lingual/palatal residue;Piecemeal swallowing;Decreased bolus cohesion Oral Phase - Comment pt placed bolus in right oral cavity to aid transiting, barium tablet placed on right mid=lingua region but needed puree to transit  CHL IP PHARYNGEAL PHASE 03/13/2016 Pharyngeal Phase WFL Pharyngeal- Pudding Teaspoon -- Pharyngeal -- Pharyngeal- Pudding Cup -- Pharyngeal -- Pharyngeal- Honey Teaspoon -- Pharyngeal -- Pharyngeal- Honey Cup -- Pharyngeal -- Pharyngeal- Nectar Teaspoon -- Pharyngeal -- Pharyngeal- Nectar Cup WFL Pharyngeal -- Pharyngeal- Nectar Straw -- Pharyngeal -- Pharyngeal- Thin Teaspoon -- Pharyngeal -- Pharyngeal- Thin Cup WFL Pharyngeal -- Pharyngeal- Thin Straw WFL Pharyngeal -- Pharyngeal- Puree WFL Pharyngeal -- Pharyngeal-  Mechanical Soft WFL Pharyngeal -- Pharyngeal- Regular -- Pharyngeal -- Pharyngeal- Multi-consistency -- Pharyngeal -- Pharyngeal- Pill WFL Pharyngeal -- Pharyngeal Comment --  CHL IP CERVICAL ESOPHAGEAL PHASE 03/13/2016 Cervical Esophageal Phase WFL Pudding Teaspoon -- Pudding Cup -- Honey Teaspoon -- Honey Cup -- Nectar Teaspoon -- Nectar Cup -- Nectar Straw -- Thin Teaspoon -- Thin Cup -- Thin Straw -- Puree -- Mechanical Soft -- Regular -- Multi-consistency -- Pill -- Cervical Esophageal Comment -- CHL IP GO 03/13/2016 Functional Assessment Tool Used MBS, clinical judgement Functional Limitations Swallowing Swallow Current Status (H0865) CI Swallow Goal Status (H8469) CI Swallow Discharge Status (G2952) CI Luanna Salk, MS [facility] SLP [phone] Macario Golds 03/13/2016, 7:20 PM               Labs:  CBC:  Recent Labs  02/07/16 0836 02/08/16 1859 03/02/16 1433 03/15/16 1230  WBC 8.4 9.4 8.5 9.5  HGB 13.7 12.1 13.2 14.4  HCT 38.7 34.0* 37.9 39.7  PLT 230 203 271 268  COAGS:  Recent Labs  03/15/16 1230  INR 1.04  APTT 34    BMP:  Recent Labs  02/07/16 0836 02/08/16 1859 03/02/16 1433  NA 139  --  140  K 3.0*  --  4.4  CL 99*  --   --   CO2 29  --  26  GLUCOSE 106*  --  96  BUN 9  --  14.2  CALCIUM 9.3  --  9.8  CREATININE 1.29* 1.25* 1.0  GFRNONAA 43* 45*  --   GFRAA 50* 52*  --     LIVER FUNCTION TESTS:  Recent Labs  03/02/16 1433  BILITOT 0.82  AST 23  ALT 12  ALKPHOS 109  PROT 7.8  ALBUMIN 3.7    TUMOR MARKERS: No results for input(s): AFPTM, CEA, CA199, CHROMGRNA in the last 8760 hours.  Assessment and Plan: 61 y.o. 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 Details/risks of procedures, including but not limited to, internal bleeding, infection, injury to  adjacent structures, venous thrombosis discussed with patient and sister with their understanding and consent.   Thank you for this interesting consult.  I greatly enjoyed meeting Tiffany Yu and look forward to participating in their care.  A copy of this report was sent to the requesting provider on this date.  Electronically Signed: D. Rowe Robert 03/15/2016, 1:47 PM   I spent a total of  25 minutes  in face to face in clinical consultation, greater than 50% of which was counseling/coordinating care for Port-A-Cath and percutaneous gastrostomy tube placements

## 2016-03-15 NOTE — Progress Notes (Signed)
Head & Neck Multidisciplinary Clinic Clinical Social Work  Clinical Social Work met with patient/family and radiation oncologist at head & neck multidisciplinary clinic to offer support and assess for psychosocial needs.  Tiffany Yu was alone for today's visit.  The patient shared she is feeling hopeful and encouraged by her friends/family.  Patient's support system includes her spouse, children, and friends.  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  CSW provided brief emotional support during visit, patient has no concerns at this time.  ONCBCN DISTRESS SCREENING 03/06/2016  Screening Type Initial Screening  Distress experienced in past week (1-10) 6  Emotional problem type Nervousness/Anxiety;Adjusting to illness;Adjusting to appearance changes  Spiritual/Religous concerns type Loss of Faith;Loss of sense of purpose  Information Concerns Type Lack of info about diagnosis;Lack of info about treatment  Physical Problem type Sleep/insomnia;Mouth sores/swallowing;Loss of appetitie;Talking      Clinical Social Work briefly discussed Clinical Social Work role and [employer] support programs/services.  Clinical Social Work encouraged patient to call with any additional questions or concerns.   Polo Riley, MSW, LCSW, OSW-C Clinical Social Worker [facility] [phone]

## 2016-03-15 NOTE — Sedation Documentation (Signed)
Port Placement completed. Pt being prepared for Gastrostomy tube placement. Vitals remain stable. Patient denies pain.

## 2016-03-15 NOTE — Progress Notes (Signed)
Oncology Nurse Navigator Documentation  To provide support, encouragement and care continuity, met with Ms. Tiffany Yu for her initial [place] RT.  She was accompanied by her sister.  I reviewed the 2-step treatment process.    Her sister observed the treatment, RTTs explained procedure.  She expressed appreciation for the learning opportunity.  Ms. Tiffany Yu completed treatment without difficulty, denied questions/concerns.  Following RT, I brought her to [place] for PEG education.  Using  PEG teaching device   and Teach Back, provided education for PEG use and care, including: hand hygiene, gravity bolus administration of daily water flushes, nutritional supplement, fluids and medications; care of tube insertion site including daily dressing change and cleaning; S&S of infection.  She correctly verbalized dressing change and cleaning procedures, S&S of infection; provided correct return demonstration of dressing change and gravity administration of water.  I provided written instructions for PEG flushing/dressing change in support of verbal instruction.  She understands I will be available for ongoing PEG educational support.  Also provided review education for [facility], using example of PowerPort.  I addressed her questions re PEG and PAC use and care.  She has PEG and PAC placement this afternoon.  [name], RN, BSN, [place] [facility] Oncology Nurse [place] [facility] [place] [phone]

## 2016-03-15 NOTE — Sedation Documentation (Signed)
Patient denies pain and is resting comfortably.  

## 2016-03-15 NOTE — Procedures (Signed)
Pre Procedure Dx: Head and Neck Cancer Post Procedural Dx: Same  Successful placement of right IJ approach port-a-cath with tip at the superior caval atrial junction. The catheter is ready for immediate use.  Successful fluoroscopic guided insertion of gastrostomy tube.   The gastrostomy tube may be used immediately for medications.   Tube feeds may be initiated in 24 hours as per the primary team.    EBL: Minimal Complications: None immediate  Jay Ellisha Bankson, MD Pager #: [phone]    

## 2016-03-15 NOTE — Sedation Documentation (Signed)
Pt complains of pain. Medication administered.

## 2016-03-15 NOTE — Discharge Instructions (Signed)
Moderate Conscious Sedation, Adult, Care After °These instructions provide you with information about caring for yourself after your procedure. Your health care provider may also give you more specific instructions. Your treatment has been planned according to current medical practices, but problems sometimes occur. Call your health care provider if you have any problems or questions after your procedure. °What can I expect after the procedure? °After your procedure, it is common: °· To feel sleepy for several hours. °· To feel clumsy and have poor balance for several hours. °· To have poor judgment for several hours. °· To vomit if you eat too soon. °Follow these instructions at home: °For at least 24 hours after the procedure:  ° °· Do not: °¨ Participate in activities where you could fall or become injured. °¨ Drive. °¨ Use heavy machinery. °¨ Drink alcohol. °¨ Take sleeping pills or medicines that cause drowsiness. °¨ Make important decisions or sign legal documents. °¨ Take care of children on your own. °· Rest. °Eating and drinking  °· Follow the diet recommended by your health care provider. °· If you vomit: °¨ Drink water, juice, or soup when you can drink without vomiting. °¨ Make sure you have little or no nausea before eating solid foods. °General instructions  °· Have a responsible adult stay with you until you are awake and alert. °· Take over-the-counter and prescription medicines only as told by your health care provider. °· If you smoke, do not smoke without supervision. °· Keep all follow-up visits as told by your health care provider. This is important. °Contact a health care provider if: °· You keep feeling nauseous or you keep vomiting. °· You feel light-headed. °· You develop a rash. °· You have a fever. °Get help right away if: °· You have trouble breathing. °This information is not intended to replace advice given to you by your health care provider. Make sure you discuss any questions you  have with your health care provider. °Document Released: 10/08/2012 Document Revised: 05/23/2015 Document Reviewed: 04/09/2015 °Elsevier Interactive Patient Education © 2017 Elsevier Inc. ° ° °Implanted Port Insertion, Care After °This sheet gives you information about how to care for yourself after your procedure. Your health care provider may also give you more specific instructions. If you have problems or questions, contact your health care provider. °What can I expect after the procedure? °After your procedure, it is common to have: °· Discomfort at the port insertion site. °· Bruising on the skin over the port. This should improve over 3-4 days. °Follow these instructions at home: °Port care  °· After your port is placed, you will get a manufacturer's information card. The card has information about your port. Keep this card with you at all times. °· Take care of the port as told by your health care provider. Ask your health care provider if you or a family member can get training for taking care of the port at home. A home health care nurse may also take care of the port. °· Make sure to remember what type of port you have. °Incision care  °· Follow instructions from your health care provider about how to take care of your port insertion site. Make sure you: °¨ Wash your hands with soap and water before you change your bandage (dressing). If soap and water are not available, use hand sanitizer. °¨ Change your dressing as told by your health care provider. °¨ Leave stitches (sutures), skin glue, or adhesive strips in place. These skin   need to stay in place for 2 weeks or longer. If adhesive strip edges start to loosen and curl up, you may trim the loose edges. Do not remove adhesive strips completely unless your health care provider tells you to do that.  Check your port insertion site every day for signs of infection. Check for:  More redness, swelling, or pain.  More fluid or  blood.  Warmth.  Pus or a bad smell. General instructions   Do not take baths, swim, or use a hot tub until your health care provider approves.  Do not lift anything that is heavier than 10 lb (4.5 kg) for a week, or as told by your health care provider.  Ask your health care provider when it is okay to:  Return to work or school.  Resume usual physical activities or sports.  Do not drive for 24 hours if you were given a medicine to help you relax (sedative).  Take over-the-counter and prescription medicines only as told by your health care provider.  Wear a medical alert bracelet in case of an emergency. This will tell any health care providers that you have a port.  Keep all follow-up visits as told by your health care provider. This is important. Contact a health care provider if:  You cannot flush your port with saline as directed, or you cannot draw blood from the port.  You have a fever or chills.  You have more redness, swelling, or pain around your port insertion site.  You have more fluid or blood coming from your port insertion site.  Your port insertion site feels warm to the touch.  You have pus or a bad smell coming from the port insertion site. Get help right away if:  You have chest pain or shortness of breath.  You have bleeding from your port that you cannot control. Summary  Take care of the port as told by your health care provider.  Change your dressing as told by your health care provider.  Keep all follow-up visits as told by your health care provider. This information is not intended to replace advice given to you by your health care provider. Make sure you discuss any questions you have with your health care provider. Document Released: 10/08/2012 Document Revised: 11/09/2015 Document Reviewed: 11/09/2015 Elsevier Interactive Patient Education  2017 [employer]. Gastrostomy Tube Home Guide, Adult A gastrostomy tube is a tube that is  surgically placed into the stomach. It is also called a G-tube. G-tubes are used when a person is unable to eat and drink enough on their own to stay healthy. The tube is inserted into the stomach through a small cut (incision) in the skin. This tube is used for:  Feeding.  Giving medication. Gastrostomy tube care  Wash your hands with soap and water.  Remove the old dressing (if any). Some styles of G-tubes may need a dressing inserted between the skin and the G-tube. Other types of G-tubes do not require a dressing. Ask your health care provider if a dressing is needed.  Check the area where the tube enters the skin (insertion site) for redness, swelling, or pus-like (purulent) drainage. A small amount of clear or tan liquid drainage is normal. Check to make sure scar tissue (skin) is not growing around the insertion site. This could have a raised, bumpy appearance.  A cotton swab can be used to clean the skin around the tube:  When the G-tube is first put in, a normal saline  solution or water can be used to clean the skin.  Mild soap and warm water can be used when the skin around the G-tube site has healed.  Roll the cotton swab around the G-tube insertion site to remove any drainage or crusting at the insertion site. Stomach residuals Feeding tube residuals are the amount of liquids that are in the stomach at any given time. Residuals may be checked before giving feedings, medications, or as instructed by your health care provider.  Ask your health care provider if there are instances when you would not start tube feedings depending on the amount or type of contents withdrawn from the stomach.  Check residuals by attaching a syringe to the G-tube and pulling back on the syringe plunger. Note the amount, and return the residual back into the stomach. Flushing the G-tube  The G-tube should be periodically flushed with clean warm water to keep it from clogging.  Flush the G-tube  after feedings or medications. Draw up 30 mL of warm water in a syringe. Connect the syringe to the G-tube and slowly push the water into the tube.  Do not push feedings, medications, or flushes rapidly. Flush the G-tube gently and slowly.  Only use syringes made for G-tubes to flush medications or feedings.  Your health care provider may want the G-tube flushed more often or with more water. If this is the case, follow your health care provider's instructions. Feedings Your health care provider will determine whether feedings are given as a bolus (a certain amount given at one time and at scheduled times) or whether feedings will be given continuously on a feeding pump.  Formulas should be given at room temperature.  If feedings are continuous, no more than 4 hours worth of feedings should be placed in the feeding bag. This helps prevent spoilage or accidental excess infusion.  Cover and place unused formula in the refrigerator.  If feedings are continuous, stop the feedings when medications or flushes are given. Be sure to restart the feedings.  Feeding bags and syringes should be replaced as instructed by your health care provider. Giving medication  In general, it is best if all medications are in a liquid form for G-tube administration. Liquid medications are less likely to clog the G-tube.  Mix the liquid medication with 30 mL (or amount recommended by your health care provider) of warm water.  Draw up the medication into the syringe.  Attach the syringe to the G-tube and slowly push the mixture into the G-tube.  After giving the medication, draw up 30 mL of warm water in the syringe and slowly flush the G-tube.  For pills or capsules, check with your health care provider first before crushing medications. Some pills are not effective if they are crushed. Some capsules are sustained-release medications.  If appropriate, crush the pill or capsule and mix with 30 mL of warm  water. Using the syringe, slowly push the medication through the tube, then flush the tube with another 30 mL of tap water. G-tube problems G-tube was pulled out.  Cause: May have been pulled out accidentally.  Solutions: Cover the opening with clean dressing and tape. Call your health care provider right away. The G-tube should be put in as soon as possible (within 4 hours) so the G-tube opening (tract) does not close. The G-tube needs to be put in at a health care setting. An X-ray needs to be done to confirm placement before the G-tube can be used again. Redness,  irritation, soreness, or foul odor around the gastrostomy site.  Cause: May be caused by leakage or infection.  Solutions: Call your health care provider right away. Large amount of leakage of fluid or mucus-like liquid present (a large amount means it soaks clothing).  Cause: Many reasons could cause the G-tube to Carylon Tamburro.  Solutions: Call your health care provider to discuss the amount of leakage. Skin or scar tissue appears to be growing where tube enters skin.  Cause: Tissue growth may develop around the insertion site if the G-tube is moved or pulled on excessively.  Solutions: Secure tube with tape so that excess movement does not occur. Call your health care provider. G-tube is clogged.  Cause: Thick formula or medication.  Solutions: Try to slowly push warm water into the tube with a large syringe. Never try to push any object into the tube to unclog it. Do not force fluid into the G-tube. If you are unable to unclog the tube, call your health care provider right away. Tips  Head of bed (HOB) position refers to the upright position of a person's upper body.  When giving medications or a feeding bolus, keep the [facility] up as told by your health care provider. Do this during the feeding and for 1 hour after the feeding or medication administration.  If continuous feedings are being given, it is best to keep the [facility] up as  told by your health care provider. When ADLs (activities of daily living) are performed and the [facility] needs to be flat, be sure to turn the feeding pump off. Restart the feeding pump when the [facility] is returned to the recommended height.  Do not pull or put tension on the tube.  To prevent fluid backflow, kink the G-tube before removing the cap or disconnecting a syringe.  Check the G-tube length every day. Measure from the insertion site to the end of the G-tube. If the length is longer than previous measurements, the tube may be coming out. Call your health care provider if you notice increasing G-tube length.  Oral care, such as brushing teeth, must be continued.  You may need to remove excess air (vent) from the G-tube. Your health care provider will tell you if this is needed.  Always call your health care provider if you have questions or problems with the G-tube. Get help right away if:  You have severe abdominal pain, tenderness, or abdominal bloating (distension).  You have nausea or vomiting.  You are constipated or have problems moving your bowels.  The G-tube insertion site is red, swollen, has a foul smell, or has yellow or brown drainage.  You have difficulty breathing or shortness of breath.  You have a fever.  You have a large amount of feeding tube residuals.  The G-tube is clogged and cannot be flushed. This information is not intended to replace advice given to you by your health care provider. Make sure you discuss any questions you have with your health care provider. Document Released: 02/26/2001 Document Revised: 05/26/2015 Document Reviewed: 08/25/2012 Elsevier Interactive Patient Education  2017 [employer].

## 2016-03-16 ENCOUNTER — Other Ambulatory Visit: Payer: Self-pay | Admitting: Hematology and Oncology

## 2016-03-16 ENCOUNTER — Ambulatory Visit
Admission: RE | Admit: 2016-03-16 | Discharge: 2016-03-16 | Disposition: A | Discharge status: Home / Self Care | Payer: 59 | Source: Ambulatory Visit | Attending: Radiation Oncology | Admitting: Radiation Oncology

## 2016-03-16 ENCOUNTER — Ambulatory Visit (HOSPITAL_BASED_OUTPATIENT_CLINIC_OR_DEPARTMENT_OTHER): Payer: 59

## 2016-03-16 VITALS — BP 129/72 | HR 108 | Temp 98.5°F | Resp 16

## 2016-03-16 DIAGNOSIS — Z5111 Encounter for antineoplastic chemotherapy: Secondary | ICD-10-CM

## 2016-03-16 DIAGNOSIS — Z510 Encounter for antineoplastic radiation therapy: Secondary | ICD-10-CM | POA: Diagnosis not present

## 2016-03-16 DIAGNOSIS — C023 Malignant neoplasm of anterior two-thirds of tongue, part unspecified: Secondary | ICD-10-CM | POA: Diagnosis not present

## 2016-03-16 MED ORDER — SODIUM CHLORIDE 0.9 % IV SOLN
40.0000 mg/m2 | Freq: Once | INTRAVENOUS | Status: AC
  Administered 2016-03-16: 88 mg via INTRAVENOUS
  Filled 2016-03-16: qty 88

## 2016-03-16 MED ORDER — HYDROMORPHONE HCL 4 MG/ML IJ SOLN
INTRAMUSCULAR | Status: AC
  Filled 2016-03-16: qty 1

## 2016-03-16 MED ORDER — PALONOSETRON HCL INJECTION 0.25 MG/5ML
INTRAVENOUS | Status: AC
  Filled 2016-03-16: qty 5

## 2016-03-16 MED ORDER — PALONOSETRON HCL INJECTION 0.25 MG/5ML
0.2500 mg | Freq: Once | INTRAVENOUS | Status: AC
  Administered 2016-03-16: 0.25 mg via INTRAVENOUS

## 2016-03-16 MED ORDER — HEPARIN SOD (PORK) LOCK FLUSH 100 UNIT/ML IV SOLN
500.0000 [IU] | Freq: Once | INTRAVENOUS | Status: AC | PRN
  Administered 2016-03-16: 500 [IU]
  Filled 2016-03-16: qty 5

## 2016-03-16 MED ORDER — SODIUM CHLORIDE 0.9% FLUSH
10.0000 mL | INTRAVENOUS | Status: DC | PRN
  Administered 2016-03-16: 10 mL
  Filled 2016-03-16: qty 10

## 2016-03-16 MED ORDER — SODIUM CHLORIDE 0.9 % IV SOLN
Freq: Once | INTRAVENOUS | Status: AC
  Administered 2016-03-16: 14:00:00 via INTRAVENOUS
  Filled 2016-03-16: qty 5

## 2016-03-16 MED ORDER — SODIUM CHLORIDE 0.9 % IV SOLN
Freq: Once | INTRAVENOUS | Status: AC
  Administered 2016-03-16: 11:00:00 via INTRAVENOUS

## 2016-03-16 MED ORDER — HYDROMORPHONE HCL 4 MG/ML IJ SOLN
2.0000 mg | INTRAMUSCULAR | Status: DC | PRN
  Administered 2016-03-16: 2 mg via INTRAVENOUS

## 2016-03-16 MED ORDER — POTASSIUM CHLORIDE 2 MEQ/ML IV SOLN
Freq: Once | INTRAVENOUS | Status: AC
  Administered 2016-03-16: 11:00:00 via INTRAVENOUS
  Filled 2016-03-16: qty 10

## 2016-03-16 NOTE — Progress Notes (Signed)
Per Cherre Huger, RN per Dr. Alvy Bimler okay to use labs from 03/02/16.   Patient had one episode of vomiting with emesis appearing clear. Patient states she no longer feels nauseous. Dr. Alvy Bimler notified, no new orders at this time.   Per Cherre Huger, RN per Dr. Alvy Bimler okay to proceed with cisplatin treatment without the patient voiding 225mls.   Discharge instructions reviewed with patient and patient's sister, both verbalized understanding.

## 2016-03-16 NOTE — Patient Instructions (Signed)
[facility] Discharge Instructions for Patients Receiving Chemotherapy  Today you received the following chemotherapy agents Cisplatin  To help prevent nausea and vomiting after your treatment, we encourage you to take your nausea medication as directed   If you develop nausea and vomiting that is not controlled by your nausea medication, call the clinic.   BELOW ARE SYMPTOMS THAT SHOULD BE REPORTED IMMEDIATELY:  *FEVER GREATER THAN 100.5 F  *CHILLS WITH OR WITHOUT FEVER  NAUSEA AND VOMITING THAT IS NOT CONTROLLED WITH YOUR NAUSEA MEDICATION  *UNUSUAL SHORTNESS OF BREATH  *UNUSUAL BRUISING OR BLEEDING  TENDERNESS IN MOUTH AND THROAT WITH OR WITHOUT PRESENCE OF ULCERS  *URINARY PROBLEMS  *BOWEL PROBLEMS  UNUSUAL RASH Items with * indicate a potential emergency and should be followed up as soon as possible.  Feel free to call the clinic you have any questions or concerns. The clinic phone number is [phone].  Please show the CHEMO ALERT CARD at check-in to the Emergency Department and triage nurse.   Cisplatin injection What is this medicine? CISPLATIN (SIS pla tin) is a chemotherapy drug. It targets fast dividing cells, like cancer cells, and causes these cells to die. This medicine is used to treat many types of cancer like bladder, ovarian, and testicular cancers. This medicine may be used for other purposes; ask your health care provider or pharmacist if you have questions. COMMON BRAND NAME(S): Platinol, Platinol -AQ What should I tell my health care provider before I take this medicine? They need to know if you have any of these conditions: -blood disorders -hearing problems -kidney disease -recent or ongoing radiation therapy -an unusual or allergic reaction to cisplatin, carboplatin, other chemotherapy, other medicines, foods, dyes, or preservatives -pregnant or trying to get pregnant -breast-feeding How should I use this medicine? This drug  is given as an infusion into a vein. It is administered in a hospital or clinic by a specially trained health care professional. Talk to your pediatrician regarding the use of this medicine in children. Special care may be needed. Overdosage: If you think you have taken too much of this medicine contact a poison control center or emergency room at once. NOTE: This medicine is only for you. Do not share this medicine with others. What if I miss a dose? It is important not to miss a dose. Call your doctor or health care professional if you are unable to keep an appointment. What may interact with this medicine? -dofetilide -foscarnet -medicines for seizures -medicines to increase blood counts like filgrastim, pegfilgrastim, sargramostim -probenecid -pyridoxine used with altretamine -rituximab -some antibiotics like amikacin, gentamicin, neomycin, polymyxin B, streptomycin, tobramycin -sulfinpyrazone -vaccines -zalcitabine Talk to your doctor or health care professional before taking any of these medicines: -acetaminophen -aspirin -ibuprofen -ketoprofen -naproxen This list may not describe all possible interactions. Give your health care provider a list of all the medicines, herbs, non-prescription drugs, or dietary supplements you use. Also tell them if you smoke, drink alcohol, or use illegal drugs. Some items may interact with your medicine. What should I watch for while using this medicine? Your condition will be monitored carefully while you are receiving this medicine. You will need important blood work done while you are taking this medicine. This drug may make you feel generally unwell. This is not uncommon, as chemotherapy can affect healthy cells as well as cancer cells. Report any side effects. Continue your course of treatment even though you feel ill unless your doctor tells you to stop.   In some cases, you may be given additional medicines to help with side effects. Follow all  directions for their use. Call your doctor or health care professional for advice if you get a fever, chills or sore throat, or other symptoms of a cold or flu. Do not treat yourself. This drug decreases your body's ability to fight infections. Try to avoid being around people who are sick. This medicine may increase your risk to bruise or bleed. Call your doctor or health care professional if you notice any unusual bleeding. Be careful brushing and flossing your teeth or using a toothpick because you may get an infection or bleed more easily. If you have any dental work done, tell your dentist you are receiving this medicine. Avoid taking products that contain aspirin, acetaminophen, ibuprofen, naproxen, or ketoprofen unless instructed by your doctor. These medicines may hide a fever. Do not become pregnant while taking this medicine. Women should inform their doctor if they wish to become pregnant or think they might be pregnant. There is a potential for serious side effects to an unborn child. Talk to your health care professional or pharmacist for more information. Do not breast-feed an infant while taking this medicine. Drink fluids as directed while you are taking this medicine. This will help protect your kidneys. Call your doctor or health care professional if you get diarrhea. Do not treat yourself. What side effects may I notice from receiving this medicine? Side effects that you should report to your doctor or health care professional as soon as possible: -allergic reactions like skin rash, itching or hives, swelling of the face, lips, or tongue -signs of infection - fever or chills, cough, sore throat, pain or difficulty passing urine -signs of decreased platelets or bleeding - bruising, pinpoint red spots on the skin, black, tarry stools, nosebleeds -signs of decreased red blood cells - unusually weak or tired, fainting spells, lightheadedness -breathing problems -changes in  hearing -gout pain -low blood counts - This drug may decrease the number of white blood cells, red blood cells and platelets. You may be at increased risk for infections and bleeding. -nausea and vomiting -pain, swelling, redness or irritation at the injection site -pain, tingling, numbness in the hands or feet -problems with balance, movement -trouble passing urine or change in the amount of urine Side effects that usually do not require medical attention (report to your doctor or health care professional if they continue or are bothersome): -changes in vision -loss of appetite -metallic taste in the mouth or changes in taste This list may not describe all possible side effects. Call your doctor for medical advice about side effects. You may report side effects to FDA at 1-800-FDA-1088. Where should I keep my medicine? This drug is given in a hospital or clinic and will not be stored at home. NOTE: This sheet is a summary. It may not cover all possible information. If you have questions about this medicine, talk to your doctor, pharmacist, or health care provider.  2018 Elsevier/Gold Standard (2007-03-25 14:40:54)    

## 2016-03-17 NOTE — Progress Notes (Signed)
Oncology Nurse Navigator Documentation  Met with Tiffany Yu during H&N [facility].  She was unaccompanied.    Provided verbal and written overview of [facility], the clinicians who will be seeing her, encouraged her to ask questions during her time with them.  She was seen by Nutrition, SLP, PT, SW and [place].  Spoke with her at end of [facility], addressed questions. She understands I can be contacted with needs/concerns.  [name], RN, BSN, [place] [facility] [place] [phone]

## 2016-03-19 ENCOUNTER — Encounter: Payer: Self-pay | Admitting: Radiation Oncology

## 2016-03-19 ENCOUNTER — Ambulatory Visit
Admission: RE | Admit: 2016-03-19 | Discharge: 2016-03-19 | Disposition: A | Discharge status: Home / Self Care | Payer: 59 | Source: Ambulatory Visit | Attending: Radiation Oncology | Admitting: Radiation Oncology

## 2016-03-19 ENCOUNTER — Telehealth: Payer: Self-pay | Admitting: *Deleted

## 2016-03-19 VITALS — BP 133/70 | HR 93 | Temp 98.4°F | Ht 67.0 in | Wt 222.0 lb

## 2016-03-19 DIAGNOSIS — Z510 Encounter for antineoplastic radiation therapy: Secondary | ICD-10-CM | POA: Diagnosis not present

## 2016-03-19 DIAGNOSIS — C023 Malignant neoplasm of anterior two-thirds of tongue, part unspecified: Secondary | ICD-10-CM

## 2016-03-19 MED ORDER — FLUCONAZOLE 100 MG PO TABS: ORAL_TABLET | ORAL | 0 refills | Status: DC

## 2016-03-19 MED ORDER — BIAFINE EX EMUL
CUTANEOUS | Status: DC | PRN
  Administered 2016-03-19: 13:00:00 via TOPICAL

## 2016-03-19 MED ORDER — LIDOCAINE VISCOUS 2 % MT SOLN: OROMUCOSAL | 5 refills | Status: DC

## 2016-03-19 MED FILL — FLUCONAZOLE 100 MG TABLET: 100 | 21 days supply | Qty: 22 | Fill #0

## 2016-03-19 MED FILL — LIDOCAINE 2% VISCOUS SOLN: 2 | 2 days supply | Qty: 100 | Fill #0

## 2016-03-19 NOTE — Progress Notes (Signed)
Pt here for patient teaching.  Pt given Radiation and You booklet, Managing Acute Radiation Side Effects for Head and Neck Cancer handout, skin care instructions, Radiaplex gel and Sonafine.  Reviewed areas of pertinence such as fatigue, mouth changes, skin changes, throat changes and taste changes . Pt able to give teach back of to pat skin, use unscented/gentle soap and drink plenty of water,apply Sonafine bid and avoid applying anything to skin within 4 hours of treatment. Pt verbalizes understanding of information given and will contact nursing with any questions or concerns.     [email or web address]

## 2016-03-19 NOTE — Telephone Encounter (Signed)
Follow up: Patient currently is not have any signs/symptoms related to chemotherapy. Patient states she is eating/drinking adequately with no fevers. Instructed patient to call with any signs/symptoms.

## 2016-03-19 NOTE — Progress Notes (Signed)
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 She denies pain. She is using hycet liquid twice daily for PEG tube pain. She has some fatigue. She received her first chemotherapy last Friday and experienced some nausea. She is flushing her PEG tube without difficulty. She is eating and drinking well. She was provided education today and will begin using biafine twice daily.   BP 133/70   Pulse 93   Temp 98.4 F (36.9 C)   Ht 5\' 7"  (1.702 m)   Wt 222 lb (100.7 kg)   SpO2 96% Comment: room air  BMI 34.77 kg/m    Wt Readings from Last 3 Encounters:  03/19/16 222 lb (100.7 kg)  03/15/16 216 lb 6 oz (98.1 kg)  03/13/16 218 lb 6.4 oz (99.1 kg)

## 2016-03-19 NOTE — Progress Notes (Signed)

## 2016-03-19 NOTE — Telephone Encounter (Signed)
Faxed completed disability papers to [employer]

## 2016-03-19 NOTE — Progress Notes (Signed)
   Weekly Management Note:  Outpatient    ICD-9-CM ICD-10-CM   1. 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HCC) 141.4 C02.3 fluconazole (DIFLUCAN) 100 MG tablet     lidocaine (XYLOCAINE) 2 % solution    Current Dose:  6.183 Gy  Projected Dose: 66 Gy   Narrative:  The patient presents for routine under treatment assessment.  CBCT/MVCT images/Port film x-rays were reviewed.  The chart was checked. Doing well overall. Started chemotherapy last week.  Physical Findings:  Wt Readings from Last 3 Encounters:  03/19/16 222 lb (100.7 kg)  03/15/16 216 lb 6 oz (98.1 kg)  03/13/16 218 lb 6.4 oz (99.1 kg)    height is 5\' 7"  (1.702 m) and weight is 222 lb (100.7 kg). Her temperature is 98.4 F (36.9 C). Her blood pressure is 133/70 and her pulse is 93. Her oxygen saturation is 96%.  NAD. Oral thrush.  CBC    Component Value Date/Time   WBC 9.5 03/15/2016 1230   RBC 5.01 03/15/2016 1230   HGB 14.4 03/15/2016 1230   HGB 13.2 03/02/2016 1433   HCT 39.7 03/15/2016 1230   HCT 37.9 03/02/2016 1433   PLT 268 03/15/2016 1230   PLT 271 03/02/2016 1433   MCV 79.2 03/15/2016 1230   MCV 82.8 03/02/2016 1433   MCH 28.7 03/15/2016 1230   MCHC 36.3 (H) 03/15/2016 1230   RDW 15.5 03/15/2016 1230   RDW 15.3 (H) 03/02/2016 1433   LYMPHSABS 2.2 03/15/2016 1230   LYMPHSABS 1.9 03/02/2016 1433   MONOABS 0.8 03/15/2016 1230   MONOABS 0.6 03/02/2016 1433   EOSABS 0.3 03/15/2016 1230   EOSABS 0.6 (H) 03/02/2016 1433   BASOSABS 0.1 03/15/2016 1230   BASOSABS 0.1 03/02/2016 1433     CMP     Component Value Date/Time   NA 140 03/02/2016 1433   K 4.4 03/02/2016 1433   CL 99 (L) 02/07/2016 0836   CO2 26 03/02/2016 1433   GLUCOSE 96 03/02/2016 1433   BUN 14.2 03/02/2016 1433   CREATININE 1.0 03/02/2016 1433   CALCIUM 9.8 03/02/2016 1433   PROT 7.8 03/02/2016 1433   ALBUMIN 3.7 03/02/2016 1433   AST 23 03/02/2016 1433   ALT 12 03/02/2016 1433   ALKPHOS 109 03/02/2016 1433   BILITOT 0.82  03/02/2016 1433   GFRNONAA 45 (L) 02/08/2016 1859   GFRAA 52 (L) 02/08/2016 1859     Impression:  The patient is tolerating radiotherapy.   Plan:  Continue radiotherapy as planned. Fluconazole for thrush and lidocaine Rx'd for future mucositis.  -----------------------------------  Eppie Gibson, MD

## 2016-03-20 ENCOUNTER — Ambulatory Visit
Admission: RE | Admit: 2016-03-20 | Discharge: 2016-03-20 | Disposition: A | Discharge status: Home / Self Care | Payer: 59 | Source: Ambulatory Visit | Attending: Radiation Oncology | Admitting: Radiation Oncology

## 2016-03-20 DIAGNOSIS — Z510 Encounter for antineoplastic radiation therapy: Secondary | ICD-10-CM | POA: Diagnosis not present

## 2016-03-21 ENCOUNTER — Telehealth: Payer: Self-pay

## 2016-03-21 ENCOUNTER — Ambulatory Visit
Admission: RE | Admit: 2016-03-21 | Discharge: 2016-03-21 | Disposition: A | Discharge status: Home / Self Care | Payer: 59 | Source: Ambulatory Visit | Attending: Radiation Oncology | Admitting: Radiation Oncology

## 2016-03-21 ENCOUNTER — Other Ambulatory Visit (HOSPITAL_BASED_OUTPATIENT_CLINIC_OR_DEPARTMENT_OTHER): Payer: 59

## 2016-03-21 DIAGNOSIS — C023 Malignant neoplasm of anterior two-thirds of tongue, part unspecified: Secondary | ICD-10-CM | POA: Diagnosis not present

## 2016-03-21 DIAGNOSIS — Z510 Encounter for antineoplastic radiation therapy: Secondary | ICD-10-CM | POA: Diagnosis not present

## 2016-03-21 LAB — COMPREHENSIVE METABOLIC PANEL
ALT: 16 U/L (ref 0–55)
AST: 26 U/L (ref 5–34)
Albumin: 3.8 g/dL (ref 3.5–5.0)
Alkaline Phosphatase: 106 U/L (ref 40–150)
Anion Gap: 10 mEq/L (ref 3–11)
BILIRUBIN TOTAL: 1.14 mg/dL (ref 0.20–1.20)
BUN: 23 mg/dL (ref 7.0–26.0)
CO2: 31 meq/L — AB (ref 22–29)
CREATININE: 1.5 mg/dL — AB (ref 0.6–1.1)
Calcium: 10.1 mg/dL (ref 8.4–10.4)
Chloride: 95 mEq/L — ABNORMAL LOW (ref 98–109)
EGFR: 43 mL/min/{1.73_m2} — ABNORMAL LOW (ref >90)
Glucose: 104 mg/dl (ref 70–140)
Potassium: 4 mEq/L (ref 3.5–5.1)
SODIUM: 136 meq/L (ref 136–145)
TOTAL PROTEIN: 7.8 g/dL (ref 6.4–8.3)

## 2016-03-21 LAB — CBC WITH DIFFERENTIAL/PLATELET
BASO%: 0.5 % (ref 0.0–2.0)
BASOS ABS: 0 10*3/uL (ref 0.0–0.1)
EOS ABS: 0.2 10*3/uL (ref 0.0–0.5)
EOS%: 2.5 % (ref 0.0–7.0)
HCT: 35.9 % (ref 34.8–46.6)
HGB: 12.6 g/dL (ref 11.6–15.9)
LYMPH%: 7.9 % — AB (ref 14.0–49.7)
MCH: 28.9 pg (ref 25.1–34.0)
MCHC: 35.1 g/dL (ref 31.5–36.0)
MCV: 82.4 fL (ref 79.5–101.0)
MONO#: 0.7 10*3/uL (ref 0.1–0.9)
MONO%: 8.1 % (ref 0.0–14.0)
NEUT#: 7.1 10*3/uL — ABNORMAL HIGH (ref 1.5–6.5)
NEUT%: 81 % — ABNORMAL HIGH (ref 38.4–76.8)
Platelets: 243 10*3/uL (ref 145–400)
RBC: 4.36 10*6/uL (ref 3.70–5.45)
RDW: 14.9 % — ABNORMAL HIGH (ref 11.2–14.5)
WBC: 8.8 10*3/uL (ref 3.9–10.3)
lymph#: 0.7 10*3/uL — ABNORMAL LOW (ref 0.9–3.3)

## 2016-03-21 LAB — MAGNESIUM: Magnesium: 2 mg/dl (ref 1.5–2.5)

## 2016-03-21 NOTE — Telephone Encounter (Signed)
-----   Message from [name], MD sent at 03/21/2016 11:13 AM EDT ----- Regarding: labs Labs today is worse Please call and make sure she stops HCTZ (microzide) and to drink more liquids ----- Message ----- From: Interface, Lab In Three Zero One Sent: 03/21/2016  10:13 AM To: [name], MD

## 2016-03-21 NOTE — Telephone Encounter (Signed)
Given below message, verbalized understanding. States that she had not stopped HCTZ, but will stop now.

## 2016-03-22 ENCOUNTER — Ambulatory Visit (HOSPITAL_BASED_OUTPATIENT_CLINIC_OR_DEPARTMENT_OTHER): Payer: 59

## 2016-03-22 ENCOUNTER — Ambulatory Visit (HOSPITAL_BASED_OUTPATIENT_CLINIC_OR_DEPARTMENT_OTHER): Payer: 59 | Admitting: Hematology and Oncology

## 2016-03-22 ENCOUNTER — Encounter: Payer: Self-pay | Admitting: Hematology and Oncology

## 2016-03-22 ENCOUNTER — Ambulatory Visit
Admission: RE | Admit: 2016-03-22 | Discharge: 2016-03-22 | Disposition: A | Discharge status: Home / Self Care | Payer: 59 | Source: Ambulatory Visit | Attending: Radiation Oncology | Admitting: Radiation Oncology

## 2016-03-22 ENCOUNTER — Telehealth: Payer: Self-pay | Admitting: Hematology and Oncology

## 2016-03-22 VITALS — BP 121/70 | HR 80 | Temp 98.0°F | Resp 17

## 2016-03-22 DIAGNOSIS — C770 Secondary and unspecified malignant neoplasm of lymph nodes of head, face and neck: Secondary | ICD-10-CM

## 2016-03-22 DIAGNOSIS — C023 Malignant neoplasm of anterior two-thirds of tongue, part unspecified: Secondary | ICD-10-CM | POA: Diagnosis not present

## 2016-03-22 DIAGNOSIS — R634 Abnormal weight loss: Secondary | ICD-10-CM

## 2016-03-22 DIAGNOSIS — K5909 Other constipation: Secondary | ICD-10-CM

## 2016-03-22 DIAGNOSIS — K5900 Constipation, unspecified: Secondary | ICD-10-CM | POA: Insufficient documentation

## 2016-03-22 DIAGNOSIS — N179 Acute kidney failure, unspecified: Secondary | ICD-10-CM

## 2016-03-22 DIAGNOSIS — R471 Dysarthria and anarthria: Secondary | ICD-10-CM

## 2016-03-22 DIAGNOSIS — Z510 Encounter for antineoplastic radiation therapy: Secondary | ICD-10-CM | POA: Diagnosis not present

## 2016-03-22 MED ORDER — SODIUM CHLORIDE 0.9% FLUSH
10.0000 mL | INTRAVENOUS | Status: DC | PRN
Start: 2016-03-22 — End: 2016-03-22
  Administered 2016-03-22: 10 mL
  Filled 2016-03-22: qty 10

## 2016-03-22 MED ORDER — SODIUM CHLORIDE 0.9 % IV SOLN
1000.0000 mL | Freq: Once | INTRAVENOUS | Status: AC
  Administered 2016-03-22: 1000 mL via INTRAVENOUS

## 2016-03-22 MED ORDER — HEPARIN SOD (PORK) LOCK FLUSH 100 UNIT/ML IV SOLN
500.0000 [IU] | Freq: Once | INTRAVENOUS | Status: AC | PRN
  Administered 2016-03-22: 500 [IU]
  Filled 2016-03-22: qty 5

## 2016-03-22 NOTE — Assessment & Plan Note (Signed)
She has mild dysarthria related to surgery.

## 2016-03-22 NOTE — Assessment & Plan Note (Signed)
This is secondary to poor oral intake, dehydration and recent chemotherapy. I will add additional IV fluid support as above and reduce dose of second cycle of chemotherapy by 50% and reassess next week.

## 2016-03-22 NOTE — Assessment & Plan Note (Signed)
The patient has experienced significant decline since the last time I saw her.  She has poor appetite, reduce oral intake and profound weight loss. She denies significant pain. She denies significant nausea or vomiting  I am very concerned about her. I recommend IV fluid hydration today and recheck blood count again tomorrow before second dose of chemotherapy. I recommend 50% dose adjustment for her for cycle 2 She will get additional IV fluids on Saturday and Monday as well.

## 2016-03-22 NOTE — Progress Notes (Signed)
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 Tongue, biopsy, Anterior deep margin left SQUAMOUS CELL CARCINOMA 3. Tongue, biopsy, Posterior deep margin left NEGATIVE FOR CARCINOMA 4. Tongue, biopsy, Posterior margin left SQUAMOUS CELL CARCINOMA 5. Tongue, excisional biopsy, New anterior deep margin NEGATIVE FOR CARCINOMA 6. Tongue, excisional biopsy, New posterior deep margin SMALL FOCUS OF SQUAMOUS CELL CARCINOMA (0.1 CM, ONLY PRESENT AT THE DEEPER PERMINENT SECTION) 7. Tongue, resection for tumor, Left lateral INFILTRATIVE KERATINIZED SQUAMOUS CELL CARCINOMA (3.2 CM) THE TUMOR INVADES UNDERNEATH MUSCLE OF THE TONGUE (2.0 CM, PT2) LYMPHOVASCULAR AND PERINEURAL INVASION IDENTIFIED SQUAMOUS CELL CARCINOMA PRESENTED AT (FINAL MARGINS REFER TO PART 2-6) LATERAL INKED MARGIN (1.0 CM) MEDIAL INKED MARGIN (0.6 CM)      02/08/2016 Surgery    SURGICAL PROCEDURES: 1. Left hemiglossectomy with primary closure. 2. Left selective neck dissection (zones I-III).      03/02/2016 Imaging    CT chest with contrast: No evidence of metastatic disease in the chest. 2. Aortic atherosclerosis.       03/02/2016 Imaging    CT neck with contrast showed : Newly enlarged and round 10 mm right  level 1b lymph node (series 5, image 45). The level 1A nodes remain normal. This right 1 B node is nonspecific but suspicious for progressive nodal metastatic disease. It might be amenable to Ultrasound-guided FNA. 2. Interval left hemiglossectomy and selective left neck dissection with confluent indeterminate soft tissue from the anterior left submandibular space through the left carotid space and obscuring the left level 1B, level 2, and level IIIa nodal stations. This study will serve as a new postoperative baseline of this area. Attention directed on close interval follow-up. 3. Mild soft tissue thickening also along the left lateral tongue resection margin. Attention directed on followup. 4. Chest CT today reported separately      03/14/2016 Procedure    She has normal baseline hearing test      03/15/2016 Procedure    1. Successful placement of a right internal jugular approach power injectable Port-A-Cath. The Port a catheter is ready for immediate use. 2. Successful fluoroscopic insertion of a 20-French pull-through gastrostomy tube. The gastrostomy may be used immediately for medication administration and may be utilized in 24 hrs for the initiation of feeds.      03/15/2016 -  Radiation Therapy    She received concurrent radiation treatment      03/16/2016 -  Chemotherapy    She received weekly cisplatin        INTERVAL HISTORY: Please see below for problem oriented charting. She returns today with her husband. Since chemotherapy last week, she had poor oral intake due to lack of desire to eat.  She denies pain.  No nausea or vomiting.  She had mild constipation. She denies tinnitus, peripheral neuropathy or hearing loss She denies recent infection.  REVIEW OF SYSTEMS:   Constitutional: Denies fevers, chills Eyes: Denies blurriness of vision Ears, nose, mouth, throat, and face: Denies mucositis or sore throat Respiratory: Denies cough, dyspnea or wheezes Cardiovascular:  Denies palpitation, chest discomfort or lower extremity swelling Gastrointestinal:  Denies nausea, heartburn or change in bowel habits Skin: Denies abnormal skin rashes Lymphatics: Denies new lymphadenopathy or easy bruising Neurological:Denies numbness, tingling or new weaknesses Behavioral/Psych: Mood is stable, no new changes  All other systems were reviewed with the patient and are negative.  I have reviewed the past medical history, past surgical history, social history and family history with the patient and they are unchanged from previous note.  ALLERGIES:  has No Known Allergies.  MEDICATIONS:  Current Outpatient Prescriptions  Medication Sig Dispense Refill  . buPROPion (ZYBAN) 150 MG 12 hr tablet Take 150 mg by mouth daily.     . chlorhexidine (PERIDEX) 0.12 % solution Rinse with 15 mls three times daily for 30 seconds. Use after breakfast, dinner, and at bedtime. Spit out excess. Do not swallow. 480 mL prn  . fluconazole (DIFLUCAN) 100 MG tablet Take 2 tablets today, then 1 tablet daily x 20 more days. Hold Atorvastatin while on this drug. 22 tablet 0  . levothyroxine (SYNTHROID, LEVOTHROID) 75 MCG tablet Take 75 mcg by mouth daily before breakfast.     . lidocaine-prilocaine (EMLA) cream Apply to affected area once 30 g 3  . Morphine Sulfate (MORPHINE CONCENTRATE) 10 mg / 0.5 ml concentrated solution Take 0.5 mLs (10 mg total) by mouth every 2 (two) hours as needed for severe pain. 120 mL 0  . triamcinolone (KENALOG) 0.1 % paste APPLY TO AFFECTED AREA    . zolpidem (AMBIEN) 10 MG tablet Take 10 mg by mouth at bedtime.     . ALPRAZolam (XANAX) 0.5 MG tablet Take 0.5 mg by mouth at bedtime as needed for sleep.     Marland Kitchen atorvastatin (LIPITOR) 10 MG tablet Take 10 mg by mouth daily at 6 PM.     . fentaNYL (DURAGESIC - DOSED MCG/HR) 25 MCG/HR patch Place 1 patch (25 mcg total) onto the skin every 3 (three) days. (Patient not taking: Reported on 03/19/2016) 5 patch 0  .  hydrochlorothiazide (MICROZIDE) 12.5 MG capsule Take 12.5 mg by mouth daily.     Marland Kitchen HYDROcodone-acetaminophen (HYCET) 7.5-325 mg/15 ml solution Take 10-15 mLs by mouth every 4 (four) hours as needed for moderate pain. (Patient not taking: Reported on 03/22/2016) 473 mL 0  . lidocaine (XYLOCAINE) 2 % solution Patient: Mix 1part 2% viscous lidocaine, 1part H20. Swish and swallow 27m of this mixture, 313m before meals and at bedtime, up to QID (Patient not taking: Reported on 03/22/2016) 100 mL 5  . naproxen sodium (ANAPROX) 220 MG tablet Take 220 mg by mouth 2 (two) times daily as needed (pain).    . ondansetron (ZOFRAN) 8 MG tablet Take 1 tablet (8 mg total) by mouth 2 (two) times daily as needed. Start on the third day after chemotherapy. (Patient not taking: Reported on 03/22/2016) 30 tablet 1  . prochlorperazine (COMPAZINE) 10 MG tablet Take 1 tablet (10 mg total) by mouth every 6 (six) hours as needed (Nausea or vomiting). (Patient not taking: Reported on 03/22/2016) 30 tablet 1   No current facility-administered medications for this visit.    Facility-Administered Medications Ordered in Other Visits  Medication Dose Route Frequency Provider Last Rate Last Dose  . heparin lock flush 100 unit/mL  500 Units Intracatheter Once PRN Atzel Mccambridge  Alvy Bimler, MD      . sodium chloride flush (NS) 0.9 % injection 10 mL  10 mL Intracatheter PRN [name], MD        PHYSICAL EXAMINATION: ECOG PERFORMANCE STATUS: 1 - Symptomatic but completely ambulatory  Vitals:   03/22/16 1025  BP: 118/61  Pulse: (!) 103  Resp: 17  Temp: 98.7 F (37.1 C)   Filed Weights   03/22/16 1025  Weight: 213 lb 12.8 oz (97 kg)    GENERAL:alert, no distress and comfortable SKIN: skin color, texture, turgor are normal, no rashes or significant lesions EYES: normal, Conjunctiva are pink and non-injected, sclera clear OROPHARYNX:no exudate, no erythema and lips, buccal mucosa, and postsurgical changes in her tongue NECK: supple,  thyroid normal size, non-tender, without nodularity LYMPH:  no palpable lymphadenopathy in the cervical, axillary or inguinal LUNGS: clear to auscultation and percussion with normal breathing effort HEART: regular rate & rhythm and no murmurs and no lower extremity edema ABDOMEN:abdomen soft, non-tender and normal bowel sounds Musculoskeletal:no cyanosis of digits and no clubbing  NEURO: alert & oriented x 3 with mild dysarthria, no focal motor/sensory deficits  LABORATORY DATA:  I have reviewed the data as listed    Component Value Date/Time   NA 136 03/21/2016 1002   K 4.0 03/21/2016 1002   CL 99 (L) 02/07/2016 0836   CO2 31 (H) 03/21/2016 1002   GLUCOSE 104 03/21/2016 1002   BUN 23.0 03/21/2016 1002   CREATININE 1.5 (H) 03/21/2016 1002   CALCIUM 10.1 03/21/2016 1002   PROT 7.8 03/21/2016 1002   ALBUMIN 3.8 03/21/2016 1002   AST 26 03/21/2016 1002   ALT 16 03/21/2016 1002   ALKPHOS 106 03/21/2016 1002   BILITOT 1.14 03/21/2016 1002   GFRNONAA 45 (L) 02/08/2016 1859   GFRAA 52 (L) 02/08/2016 1859    No results found for: SPEP, UPEP  Lab Results  Component Value Date   WBC 8.8 03/21/2016   NEUTROABS 7.1 (H) 03/21/2016   HGB 12.6 03/21/2016   HCT 35.9 03/21/2016   MCV 82.4 03/21/2016   PLT 243 03/21/2016      Chemistry      Component Value Date/Time   NA 136 03/21/2016 1002   K 4.0 03/21/2016 1002   CL 99 (L) 02/07/2016 0836   CO2 31 (H) 03/21/2016 1002   BUN 23.0 03/21/2016 1002   CREATININE 1.5 (H) 03/21/2016 1002      Component Value Date/Time   CALCIUM 10.1 03/21/2016 1002   ALKPHOS 106 03/21/2016 1002   AST 26 03/21/2016 1002   ALT 16 03/21/2016 1002   BILITOT 1.14 03/21/2016 1002       RADIOGRAPHIC STUDIES: I have personally reviewed the radiological images as listed and agreed with the findings in the report. Ct Soft Tissue Neck W Contrast  Result Date: 03/02/2016 CLINICAL DATA:  61 year old female with squamous cell carcinoma along the entire  left lateral tongue. Status post surgical resection, left hemiglossectomy and selective neck dissection (zones 1 through 3). Subsequent encounter. EXAM: CT NECK WITH CONTRAST TECHNIQUE: Multidetector CT imaging of the neck was performed using the standard protocol following the bolus administration of intravenous contrast. CONTRAST:  56m ISOVUE-300 IOPAMIDOL (ISOVUE-300) INJECTION 61% in conjunction with contrast enhanced imaging of the chest reported separately. COMPARISON:  Pretreatment neck CT 01/05/2016 FINDINGS: Pharynx and larynx: Motion artifact at the subglottic larynx. Laryngeal soft tissue contours appear within normal limits. Pharyngeal soft tissue contours are within normal limits. Parapharyngeal and retropharyngeal spaces appear normal.  Salivary glands: The left submandibular gland appears to be surgically absent, with an indistinct area of generalized soft tissue thickening which extends from the posterior left sublingual space/anterior submandibular space posteriorly to the left carotid space. See series 5, image 41. This was the same region of the of the left level 2/3 malignant 17 mm lymph node on the pretreatment study. The postoperative soft tissue thickening extends cephalad to the inferior parotid space and caudally to the level of the thyroid cartilage. The soft tissue changes are inseparable from the anterior residual left sternocleidomastoid muscle. There is mild soft tissue thickening and asymmetry along the residual left posterolateral oral tongue near the left retromolar trigone as seen on series 5, image 30. This is near the area of the tongue mass delineated on the pretreatment CT. The anterior sublingual space, the right submandibular space and the right submandibular gland are stable and within normal limits. See right level 1 lymph node findings below. Aside from the postoperative changes to the inferior left parotid space the parotid glands are stable and within normal limits.  Thyroid: Stable, negative. Lymph nodes: Abnormal right level 1B lymph node is rounded and measures 9-10 mm short axis now (series 5, image 45), versus 4 mm or smaller in January. Small level 1a lymph nodes remain normal on series 5, image 51. The left level 1B, level 2, and level 3a nodal stations are obscured by the confluent soft tissue described above (series 5, image 41). Small level IIIb lymph nodes remain normal (i.e. Series 5, image 44). Right level 2, right level 3, bilateral level 4 and bilateral level 5 nodes are stable and within normal limits. Vascular: Major vascular structures in the neck and at the skullbase remain patent, including the left internal jugular vein which is diminutive. The postoperative confluent soft tissue on the left does involve the left carotid space, but there is no mass effect on the left carotid. Limited intracranial: Negative. Visualized orbits: Negative. Mastoids and visualized paranasal sinuses: Visualized paranasal sinuses and mastoids are stable and well pneumatized. Skeleton: No acute osseous abnormality identified. Mandible is intact. Upper chest: Chest CT today reported separately. IMPRESSION: 1. Newly enlarged and round 10 mm right level 1b lymph node (series 5, image 45). The level 1A nodes remain normal. This right 1 B node is nonspecific but suspicious for progressive nodal metastatic disease. It might be amenable to Ultrasound-guided FNA. 2. Interval left hemiglossectomy and selective left neck dissection with confluent indeterminate soft tissue from the anterior left submandibular space through the left carotid space and obscuring the left level 1B, level 2, and level IIIa nodal stations. This study will serve as a new postoperative baseline of this area. Attention directed on close interval follow-up. 3. Mild soft tissue thickening also along the left lateral tongue resection margin. Attention directed on followup. 4. Chest CT today reported separately.  Electronically Signed   By: Genevie Ann M.D.   On: 03/02/2016 16:05   Ct Chest W Contrast  Result Date: 03/02/2016 CLINICAL DATA:  Left lateral tongue squamous cell carcinoma status post partial left glossectomy with neck dissection 02/08/2016. 3 of 20 regional lymph nodes in the neck positive for metastatic disease. Chest staging. EXAM: CT CHEST WITH CONTRAST TECHNIQUE: Multidetector CT imaging of the chest was performed during intravenous contrast administration. CONTRAST:  67m ISOVUE-300 IOPAMIDOL (ISOVUE-300) INJECTION 61% COMPARISON:  05/24/2015 chest CT angiogram. FINDINGS: Cardiovascular: Normal heart size. No significant pericardial fluid/thickening. Atherosclerotic nonaneurysmal thoracic aorta (maximum diameter of the ascending thoracic aorta  3.7 cm, stable). Normal caliber pulmonary arteries. No central pulmonary emboli. Mediastinum/Nodes: Stable hypodense 0.8 cm left thyroid lobe nodule. Unremarkable esophagus. No pathologically enlarged axillary, mediastinal or hilar lymph nodes. Lungs/Pleura: No pneumothorax. No pleural effusion. Subpleural superior segment right lower lobe 3 mm solid pulmonary nodule (series 8/ image 54), subpleural 3 mm anterior right middle lobe pulmonary nodule associated with the minor fissure (series 8/ image 74), subpleural anterior right lower lobe 3 mm solid pulmonary nodule (series 8/ image 88) and subpleural 3 mm basilar left lower lobe solid pulmonary nodule (series 8/ image 117) are all stable back to at least 02/03/2012 and considered benign. No acute consolidative airspace disease, lung masses or additional significant pulmonary nodules. Upper abdomen: Cholecystectomy. Hypodense subcentimeter anterior upper right renal cortical lesion is too small to characterize and not appreciably changed back to the 02/03/2012, most consistent with a benign renal cyst. Musculoskeletal: No aggressive appearing focal osseous lesions. Mild thoracic spondylosis. IMPRESSION: 1. No evidence  of metastatic disease in the chest. 2. Aortic atherosclerosis. Electronically Signed   By: Ilona Sorrel M.D.   On: 03/02/2016 16:54   Ir Gastrostomy Tube Mod Sed  Result Date: 03/15/2016 INDICATION: History of tongue cancer. In need of durable intravenous access for chemotherapy administration. Please perform percutaneous gastrostomy tube for enteric nutrition supplementation prior to the initiation of chemoradiation. EXAM: 1. IMPLANTED PORT A CATH PLACEMENT WITH ULTRASOUND AND FLUOROSCOPIC GUIDANCE 2. FLUOROSCOPIC GUIDED PERCUTANEOUS GASTROSTOMY TUBE PLACEMENT COMPARISON:  CHEST CT - 03/02/2016 MEDICATIONS: Ancef 2 gm IV; The antibiotic was administered within an appropriate time interval prior to skin puncture. ANESTHESIA/SEDATION: Moderate (conscious) sedation was employed during this procedure. A total of Versed 7 mg and Fentanyl 225 mcg was administered intravenously. Moderate Sedation Time: 43 minutes. The patient's level of consciousness and vital signs were monitored continuously by radiology nursing throughout the procedure under my direct supervision. CONTRAST:  20 cc Isovue 300, administered into the stomach lumen. FLUOROSCOPY TIME:  A total of 5 minutes, 12 seconds (279 mGy) fluoroscopy time was utilized for both procedures. COMPLICATIONS: None immediate. PROCEDURE: The procedures, risks, benefits, and alternatives were explained to the patient. Questions regarding the procedure were encouraged and answered. The patient understands and consents to both procedures. Attention was initially paid towards placement of the [facility] a Catheter. The right neck and chest were prepped with chlorhexidine in a sterile fashion, and a sterile drape was applied covering the operative field. Maximum barrier sterile technique with sterile gowns and gloves were used for the procedure. A timeout was performed prior to the initiation of the procedure. Local anesthesia was provided with 1% lidocaine with epinephrine. After  creating a small venotomy incision, a micropuncture kit was utilized to access the internal jugular vein under direct, real-time ultrasound guidance. Ultrasound image documentation was performed. The microwire was kinked to measure appropriate catheter length. A subcutaneous port pocket was then created along the upper chest wall utilizing a combination of sharp and blunt dissection. The pocket was irrigated with sterile saline. A single lumen ISP power injectable port was chosen for placement. The 8 Fr catheter was tunneled from the port pocket site to the venotomy incision. The port was placed in the pocket. The external catheter was trimmed to appropriate length. At the venotomy, an 8 Fr peel-away sheath was placed over a guidewire under fluoroscopic guidance. The catheter was then placed through the sheath and the sheath was removed. Final catheter positioning was confirmed and documented with a fluoroscopic spot radiograph. The port was  accessed with a Huber needle, aspirated and flushed with heparinized saline. The venotomy site was closed with an interrupted 4-0 Vicryl suture. The port pocket incision was closed with interrupted 2-0 Vicryl suture and the skin was opposed with a running subcuticular 4-0 Vicryl suture. Dermabond and Steri-strips were applied to both incisions. Dressings were placed. Next, attention was paid towards placement of the gastrostomy tube. The left upper quadrant was sterilely prepped and draped. An oral gastric catheter was inserted into the stomach under fluoroscopy. The existing nasogastric feeding tube was removed. The left costal margin and air / barium opacified transverse colon were identified and avoided. Air was injected into the stomach for insufflation and visualization under fluoroscopy. Under sterile conditions a 17 gauge trocar needle was utilized to access the stomach percutaneously beneath the left subcostal margin after the overlying soft tissues were anesthetized  with 1% Lidocaine with epinephrine. Needle position was confirmed within the stomach with aspiration of air and injection of small amount of contrast. A single T tack was deployed for gastropexy. Over an Amplatz guide wire, a 9-French sheath was inserted into the stomach. A snare device was utilized to capture the oral gastric catheter. The snare device was pulled retrograde from the stomach up the esophagus and out the oropharynx. The 20-French pull-through gastrostomy was connected to the snare device and pulled antegrade through the oropharynx down the esophagus into the stomach and then through the percutaneous tract external to the patient. The gastrostomy was assembled externally. Contrast injection confirms position in the stomach. Several spot radiographic images were obtained in various obliquities for documentation. Dressings were placed. The patient tolerated both above procedures well without immediate post procedural complication. FINDINGS: After catheter placement, the tip lies within the superior cavoatrial junction. The catheter aspirates and flushes normally and is ready for immediate use. After successful fluoroscopic guided placement, the gastrostomy tube is appropriately positioned with internal disc against the ventral aspect of the gastric lumen. IMPRESSION: 1. Successful placement of a right internal jugular approach power injectable Port-A-Cath. The Port a catheter is ready for immediate use. 2. Successful fluoroscopic insertion of a 20-French pull-through gastrostomy tube. The gastrostomy may be used immediately for medication administration and may be utilized in 24 hrs for the initiation of feeds. Electronically Signed   By: Sandi Mariscal M.D.   On: 03/15/2016 16:56   Ir US Guide Vasc Access Right  Result Date: 03/15/2016 INDICATION: History of tongue cancer. In need of durable intravenous access for chemotherapy administration. Please perform percutaneous gastrostomy tube for enteric  nutrition supplementation prior to the initiation of chemoradiation. EXAM: 1. IMPLANTED PORT A CATH PLACEMENT WITH ULTRASOUND AND FLUOROSCOPIC GUIDANCE 2. FLUOROSCOPIC GUIDED PERCUTANEOUS GASTROSTOMY TUBE PLACEMENT COMPARISON:  CHEST CT - 03/02/2016 MEDICATIONS: Ancef 2 gm IV; The antibiotic was administered within an appropriate time interval prior to skin puncture. ANESTHESIA/SEDATION: Moderate (conscious) sedation was employed during this procedure. A total of Versed 7 mg and Fentanyl 225 mcg was administered intravenously. Moderate Sedation Time: 43 minutes. The patient's level of consciousness and vital signs were monitored continuously by radiology nursing throughout the procedure under my direct supervision. CONTRAST:  20 cc Isovue 300, administered into the stomach lumen. FLUOROSCOPY TIME:  A total of 5 minutes, 12 seconds (279 mGy) fluoroscopy time was utilized for both procedures. COMPLICATIONS: None immediate. PROCEDURE: The procedures, risks, benefits, and alternatives were explained to the patient. Questions regarding the procedure were encouraged and answered. The patient understands and consents to both procedures. Attention was initially paid  towards placement of the [facility] a Catheter. The right neck and chest were prepped with chlorhexidine in a sterile fashion, and a sterile drape was applied covering the operative field. Maximum barrier sterile technique with sterile gowns and gloves were used for the procedure. A timeout was performed prior to the initiation of the procedure. Local anesthesia was provided with 1% lidocaine with epinephrine. After creating a small venotomy incision, a micropuncture kit was utilized to access the internal jugular vein under direct, real-time ultrasound guidance. Ultrasound image documentation was performed. The microwire was kinked to measure appropriate catheter length. A subcutaneous port pocket was then created along the upper chest wall utilizing a combination of  sharp and blunt dissection. The pocket was irrigated with sterile saline. A single lumen ISP power injectable port was chosen for placement. The 8 Fr catheter was tunneled from the port pocket site to the venotomy incision. The port was placed in the pocket. The external catheter was trimmed to appropriate length. At the venotomy, an 8 Fr peel-away sheath was placed over a guidewire under fluoroscopic guidance. The catheter was then placed through the sheath and the sheath was removed. Final catheter positioning was confirmed and documented with a fluoroscopic spot radiograph. The port was accessed with a Huber needle, aspirated and flushed with heparinized saline. The venotomy site was closed with an interrupted 4-0 Vicryl suture. The port pocket incision was closed with interrupted 2-0 Vicryl suture and the skin was opposed with a running subcuticular 4-0 Vicryl suture. Dermabond and Steri-strips were applied to both incisions. Dressings were placed. Next, attention was paid towards placement of the gastrostomy tube. The left upper quadrant was sterilely prepped and draped. An oral gastric catheter was inserted into the stomach under fluoroscopy. The existing nasogastric feeding tube was removed. The left costal margin and air / barium opacified transverse colon were identified and avoided. Air was injected into the stomach for insufflation and visualization under fluoroscopy. Under sterile conditions a 17 gauge trocar needle was utilized to access the stomach percutaneously beneath the left subcostal margin after the overlying soft tissues were anesthetized with 1% Lidocaine with epinephrine. Needle position was confirmed within the stomach with aspiration of air and injection of small amount of contrast. A single T tack was deployed for gastropexy. Over an Amplatz guide wire, a 9-French sheath was inserted into the stomach. A snare device was utilized to capture the oral gastric catheter. The snare device was  pulled retrograde from the stomach up the esophagus and out the oropharynx. The 20-French pull-through gastrostomy was connected to the snare device and pulled antegrade through the oropharynx down the esophagus into the stomach and then through the percutaneous tract external to the patient. The gastrostomy was assembled externally. Contrast injection confirms position in the stomach. Several spot radiographic images were obtained in various obliquities for documentation. Dressings were placed. The patient tolerated both above procedures well without immediate post procedural complication. FINDINGS: After catheter placement, the tip lies within the superior cavoatrial junction. The catheter aspirates and flushes normally and is ready for immediate use. After successful fluoroscopic guided placement, the gastrostomy tube is appropriately positioned with internal disc against the ventral aspect of the gastric lumen. IMPRESSION: 1. Successful placement of a right internal jugular approach power injectable Port-A-Cath. The Port a catheter is ready for immediate use. 2. Successful fluoroscopic insertion of a 20-French pull-through gastrostomy tube. The gastrostomy may be used immediately for medication administration and may be utilized in 24 hrs for the initiation of  feeds. Electronically Signed   By: Sandi Mariscal M.D.   On: 03/15/2016 16:56   Dg Swallowing Func-speech Pathology  Result Date: 03/13/2016 Objective Swallowing Evaluation: Type of Study: MBS-Modified Barium Swallow Study Patient Details Name: CHEQUITA MOFIELD MRN: [id number] Date of Birth: 1955-04-17 Today's Date: 03/13/2016 Time: SLP Start Time (ACUTE ONLY): 1343-SLP Stop Time (ACUTE ONLY): 1410 SLP Time Calculation (min) (ACUTE ONLY): 27 min Past Medical History: Past Medical History: Diagnosis Date . Anxiety  . Arthritis  . Depression   takes ZYban daily . History of colon polyps   benign . Hyperlipidemia   takes Atorvastatin daily . Hypertension   takes  HCTZ daily . Hypothyroidism   takes Synthroid daily . Insomnia   takes Ambien nightly and Xanax as needed . Refusal of blood transfusions as patient is Jehovah's Witness  Past Surgical History: Past Surgical History: Procedure Laterality Date . CHOLECYSTECTOMY   . COLONOSCOPY   . DILATION AND CURETTAGE OF UTERUS   . EXCISION OF TONGUE LESION Left 02/08/2016  Procedure: WIDE LOCAL EXCISION OF LEFT LATERAL TONGUE;  Surgeon: Jerrell Belfast, MD;  Location: [facility];  Service: ENT;  Laterality: Left; . FEMUR IM NAIL Right 02/28/2012  Procedure: OPEN REDUCTION INTERNAL FIXATION (ORIF) RIGHT KNEE;  Surgeon: Alta Corning, MD;  Location: [place];  Service: Orthopedics;  Laterality: Right; . KNEE ARTHROPLASTY  12/14/2011  Procedure: COMPUTER ASSISTED TOTAL KNEE ARTHROPLASTY;  Surgeon: Alta Corning, MD;  Location: [facility];  Service: Orthopedics;  Laterality: Right; . KNEE ARTHROSCOPY    Right . PLANTAR FASCIA SURGERY    right foot . RADICAL NECK DISSECTION Left 02/08/2016  Procedure: LEFT SELECTIVE RADICAL NECK DISSECTION;  Surgeon: Jerrell Belfast, MD;  Location: [facility] OR;  Service: ENT;  Laterality: Left; . TENDON RELEASE    right wrist . 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 Rad tx begins 03-15-16, chemo begins 03-16-16. Pt with PEG placement Thursday 03-15-16.  Pt reports improved swallow/speech since recovery from surgery.   Subjective: pt ambulated easily into xray department, friendly Assessment / Plan / Recommendation CHL IP CLINICAL IMPRESSIONS 03/13/2016 Clinical Impression Pt presents with mild oral dysphagia due to structural changes and ? 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  Oral difficultlies characterized by oral discoordination/transiting difficulties.  She compensates well by piecemealing and placing boluses on right side of oral cavity.  Pharyngeal  swallow was strong with only occasional minimal residuals of thin liquids.  No aspiration or penetration noted.  Barium tablet taken with pudding after inability to orally transit with thin transited pharynx easily.  Tablet appeared to lodge in mid=esophagus with initial referrant sensastion to pharynx - that abated with pt reflexively swallowing.  Pt appeared with backflow of liquids at mid=esophagus when taken to clear tablet - without sensation.  Advised pt to assure adequate liquid intake with meds with puree.  Encouraged pt to continue po as tolerated during XRT and provided written handout for xerostomia, possible radiation side effectis.  Provided pt with syringe to use for liquid adminiistration/oral care as XRT progressed.  Thanks for this referral.  SLP Visit Diagnosis Dysphagia, oral phase (R13.11) Attention and concentration deficit following -- Frontal lobe and executive function deficit following -- Impact on safety and function Mild aspiration risk     CHL IP DIET RECOMMENDATION 03/13/2016 SLP Diet Recommendations Dysphagia 3 (Mech  soft) solids;Thin liquid Liquid Administration via Cup;Straw Medication Administration Whole meds with puree Compensations Slow rate;Small sips/bites;Lingual sweep for clearance of pocketing Postural Changes Remain semi-upright after after feeds/meals (Comment);Seated upright at 90 degrees   CHL IP OTHER RECOMMENDATIONS 03/13/2016 Recommended Consults (No Data) Oral Care Recommendations Oral care BID Other Recommendations --   CHL IP FOLLOW UP RECOMMENDATIONS 03/13/2016 Follow up Recommendations (No Data)   No flowsheet data found.     CHL IP ORAL PHASE 03/13/2016 Oral Phase Impaired Oral - Pudding Teaspoon -- Oral - Pudding Cup -- Oral - Honey Teaspoon -- Oral - Honey Cup -- Oral - Nectar Teaspoon -- Oral - Nectar Cup Weak lingual manipulation;Reduced posterior propulsion;Piecemeal swallowing;Premature spillage Oral - Nectar Straw -- Oral - Thin Teaspoon -- Oral - Thin Cup  Weak lingual manipulation;Reduced posterior propulsion;Piecemeal swallowing;Delayed oral transit;Decreased bolus cohesion;Premature spillage Oral - Thin Straw Weak lingual manipulation;Reduced posterior propulsion;Piecemeal swallowing;Decreased bolus cohesion;Premature spillage Oral - Puree Weak lingual manipulation;Lingual pumping;Reduced posterior propulsion;Piecemeal swallowing;Delayed oral transit;Premature spillage Oral - Mech Soft Impaired mastication;Weak lingual manipulation;Reduced posterior propulsion;Piecemeal swallowing;Delayed oral transit Oral - Regular -- Oral - Multi-Consistency -- Oral - Pill Weak lingual manipulation;Reduced posterior propulsion;Lingual/palatal residue;Piecemeal swallowing;Decreased bolus cohesion Oral Phase - Comment pt placed bolus in right oral cavity to aid transiting, barium tablet placed on right mid=[place] region but needed puree to transit  CHL IP PHARYNGEAL PHASE 03/13/2016 Pharyngeal Phase WFL Pharyngeal- Pudding Teaspoon -- Pharyngeal -- Pharyngeal- Pudding Cup -- Pharyngeal -- Pharyngeal- Honey Teaspoon -- Pharyngeal -- Pharyngeal- Honey Cup -- Pharyngeal -- Pharyngeal- Nectar Teaspoon -- Pharyngeal -- Pharyngeal- Nectar Cup WFL Pharyngeal -- Pharyngeal- Nectar Straw -- Pharyngeal -- Pharyngeal- Thin Teaspoon -- Pharyngeal -- Pharyngeal- Thin Cup WFL Pharyngeal -- Pharyngeal- Thin Straw WFL Pharyngeal -- Pharyngeal- Puree WFL Pharyngeal -- Pharyngeal- Mechanical Soft WFL Pharyngeal -- Pharyngeal- Regular -- Pharyngeal -- Pharyngeal- Multi-consistency -- Pharyngeal -- Pharyngeal- Pill WFL Pharyngeal -- Pharyngeal Comment --  CHL IP CERVICAL ESOPHAGEAL PHASE 03/13/2016 Cervical Esophageal Phase WFL Pudding Teaspoon -- Pudding Cup -- Honey Teaspoon -- Honey Cup -- Nectar Teaspoon -- Nectar Cup -- Nectar Straw -- Thin Teaspoon -- Thin Cup -- Thin Straw -- Puree -- Mechanical Soft -- Regular -- Multi-consistency -- Pill -- Cervical Esophageal Comment -- CHL IP GO 03/13/2016  Functional Assessment Tool Used MBS, clinical judgement Functional Limitations Swallowing Swallow Current Status (D1497) CI Swallow Goal Status (W2637) CI Swallow Discharge Status (C5885) CI Luanna Salk, MS [facility] SLP [phone] Macario Golds 03/13/2016, 7:20 PM              Ir Cyndy Freeze Guide Port Insertion Right  Result Date: 03/15/2016 INDICATION: History of tongue cancer. In need of durable intravenous access for chemotherapy administration. Please perform percutaneous gastrostomy tube for enteric nutrition supplementation prior to the initiation of chemoradiation. EXAM: 1. IMPLANTED PORT A CATH PLACEMENT WITH ULTRASOUND AND FLUOROSCOPIC GUIDANCE 2. FLUOROSCOPIC GUIDED PERCUTANEOUS GASTROSTOMY TUBE PLACEMENT COMPARISON:  CHEST CT - 03/02/2016 MEDICATIONS: Ancef 2 gm IV; The antibiotic was administered within an appropriate time interval prior to skin puncture. ANESTHESIA/SEDATION: Moderate (conscious) sedation was employed during this procedure. A total of Versed 7 mg and Fentanyl 225 mcg was administered intravenously. Moderate Sedation Time: 43 minutes. The patient's level of consciousness and vital signs were monitored continuously by radiology nursing throughout the procedure under my direct supervision. CONTRAST:  20 cc Isovue 300, administered into the stomach lumen. FLUOROSCOPY TIME:  A total of 5 minutes, 12 seconds (279 mGy) fluoroscopy time was utilized for both  procedures. COMPLICATIONS: None immediate. PROCEDURE: The procedures, risks, benefits, and alternatives were explained to the patient. Questions regarding the procedure were encouraged and answered. The patient understands and consents to both procedures. Attention was initially paid towards placement of the [facility] a Catheter. The right neck and chest were prepped with chlorhexidine in a sterile fashion, and a sterile drape was applied covering the operative field. Maximum barrier sterile technique with sterile gowns and gloves were used for  the procedure. A timeout was performed prior to the initiation of the procedure. Local anesthesia was provided with 1% lidocaine with epinephrine. After creating a small venotomy incision, a micropuncture kit was utilized to access the internal jugular vein under direct, real-time ultrasound guidance. Ultrasound image documentation was performed. The microwire was kinked to measure appropriate catheter length. A subcutaneous port pocket was then created along the upper chest wall utilizing a combination of sharp and blunt dissection. The pocket was irrigated with sterile saline. A single lumen ISP power injectable port was chosen for placement. The 8 Fr catheter was tunneled from the port pocket site to the venotomy incision. The port was placed in the pocket. The external catheter was trimmed to appropriate length. At the venotomy, an 8 Fr peel-away sheath was placed over a guidewire under fluoroscopic guidance. The catheter was then placed through the sheath and the sheath was removed. Final catheter positioning was confirmed and documented with a fluoroscopic spot radiograph. The port was accessed with a Huber needle, aspirated and flushed with heparinized saline. The venotomy site was closed with an interrupted 4-0 Vicryl suture. The port pocket incision was closed with interrupted 2-0 Vicryl suture and the skin was opposed with a running subcuticular 4-0 Vicryl suture. Dermabond and Steri-strips were applied to both incisions. Dressings were placed. Next, attention was paid towards placement of the gastrostomy tube. The left upper quadrant was sterilely prepped and draped. An oral gastric catheter was inserted into the stomach under fluoroscopy. The existing nasogastric feeding tube was removed. The left costal margin and air / barium opacified transverse colon were identified and avoided. Air was injected into the stomach for insufflation and visualization under fluoroscopy. Under sterile conditions a 17 gauge  trocar needle was utilized to access the stomach percutaneously beneath the left subcostal margin after the overlying soft tissues were anesthetized with 1% Lidocaine with epinephrine. Needle position was confirmed within the stomach with aspiration of air and injection of small amount of contrast. A single T tack was deployed for gastropexy. Over an Amplatz guide wire, a 9-French sheath was inserted into the stomach. A snare device was utilized to capture the oral gastric catheter. The snare device was pulled retrograde from the stomach up the esophagus and out the oropharynx. The 20-French pull-through gastrostomy was connected to the snare device and pulled antegrade through the oropharynx down the esophagus into the stomach and then through the percutaneous tract external to the patient. The gastrostomy was assembled externally. Contrast injection confirms position in the stomach. Several spot radiographic images were obtained in various obliquities for documentation. Dressings were placed. The patient tolerated both above procedures well without immediate post procedural complication. FINDINGS: After catheter placement, the tip lies within the superior cavoatrial junction. The catheter aspirates and flushes normally and is ready for immediate use. After successful fluoroscopic guided placement, the gastrostomy tube is appropriately positioned with internal disc against the ventral aspect of the gastric lumen. IMPRESSION: 1. Successful placement of a right internal jugular approach power injectable Port-A-Cath. The Port a  catheter is ready for immediate use. 2. Successful fluoroscopic insertion of a 20-French pull-through gastrostomy tube. The gastrostomy may be used immediately for medication administration and may be utilized in 24 hrs for the initiation of feeds. Electronically Signed   By: Sandi Mariscal M.D.   On: 03/15/2016 16:56    ASSESSMENT & PLAN:  Cancer of anterior two-thirds of tongue [facility]) The  patient has experienced significant decline since the last time I saw her.  She has poor appetite, reduce oral intake and profound weight loss. She denies significant pain. She denies significant nausea or vomiting  I am very concerned about her. I recommend IV fluid hydration today and recheck blood count again tomorrow before second dose of chemotherapy. I recommend 50% dose adjustment for her for cycle 2 She will get additional IV fluids on Saturday and Monday as well.  Acute renal failure ([place]) This is secondary to poor oral intake, dehydration and recent chemotherapy. I will add additional IV fluid support as above and reduce dose of second cycle of chemotherapy by 50% and reassess next week.  Dysarthria She has mild dysarthria related to surgery.  Weight loss She has profound weight loss due to poor oral intake. We discussed different diet especially high-calorie supplementation. She was see dietitian weekly. I recommend consideration to start using the feeding tube if she cannot take much by mouth  Constipation She has recent poor oral intake and constipation. We discussed laxative therapy   No orders of the defined types were placed in this encounter.  All questions were answered. The patient knows to call the clinic with any problems, questions or concerns. No barriers to learning was detected. I spent 25 minutes counseling the patient face to face. The total time spent in the appointment was 40 minutes and more than 50% was on counseling and review of test results     [name], MD 03/22/2016 12:51 PM

## 2016-03-22 NOTE — Telephone Encounter (Signed)
Gave patient AVS and calender per 3/22 los. To ask MD about IVF on Saturday per patient and will follow up and let them know if it needs to be scheduled.

## 2016-03-22 NOTE — Assessment & Plan Note (Signed)
She has profound weight loss due to poor oral intake. We discussed different diet especially high-calorie supplementation. She was see dietitian weekly. I recommend consideration to start using the feeding tube if she cannot take much by mouth

## 2016-03-22 NOTE — Assessment & Plan Note (Signed)
She has recent poor oral intake and constipation. We discussed laxative therapy

## 2016-03-22 NOTE — Patient Instructions (Signed)
Dehydration, Adult Dehydration is when there is not enough fluid or water in your body. This happens when you lose more fluids than you take in. Dehydration can range from mild to very bad. It should be treated right away to keep it from getting very bad. Symptoms of mild dehydration may include:   Thirst.  Dry lips.  Slightly dry mouth.  Dry, warm skin.  Dizziness. Symptoms of moderate dehydration may include:   Very dry mouth.  Muscle cramps.  Dark pee (urine). Pee may be the color of tea.  Your body making less pee.  Your eyes making fewer tears.  Heartbeat that is uneven or faster than normal (palpitations).  Headache.  Light-headedness, especially when you stand up from sitting.  Fainting (syncope). Symptoms of very bad dehydration may include:   Changes in skin, such as:  Cold and clammy skin.  Blotchy (mottled) or pale skin.  Skin that does not quickly return to normal after being lightly pinched and let go (poor skin turgor).  Changes in body fluids, such as:  Feeling very thirsty.  Your eyes making fewer tears.  Not sweating when body temperature is high, such as in hot weather.  Your body making very little pee.  Changes in vital signs, such as:  Weak pulse.  Pulse that is more than 100 beats a minute when you are sitting still.  Fast breathing.  Low blood pressure.  Other changes, such as:  Sunken eyes.  Cold hands and feet.  Confusion.  Lack of energy (lethargy).  Trouble waking up from sleep.  Short-term weight loss.  Unconsciousness. Follow these instructions at home:  If told by your doctor, drink an ORS:  Make an ORS by using instructions on the package.  Start by drinking small amounts, about  cup (120 mL) every 5-10 minutes.  Slowly drink more until you have had the amount that your doctor said to have.  Drink enough clear fluid to keep your pee clear or pale yellow. If you were told to drink an ORS, finish the  ORS first, then start slowly drinking clear fluids. Drink fluids such as:  Water. Do not drink only water by itself. Doing that can make the salt (sodium) level in your body get too low (hyponatremia).  Ice chips.  Fruit juice that you have added water to (diluted).  Low-calorie sports drinks.  Avoid:  Alcohol.  Drinks that have a lot of sugar. These include high-calorie sports drinks, fruit juice that does not have water added, and soda.  Caffeine.  Foods that are greasy or have a lot of fat or sugar.  Take over-the-counter and prescription medicines only as told by your doctor.  Do not take salt tablets. Doing that can make the salt level in your body get too high (hypernatremia).  Eat foods that have minerals (electrolytes). Examples include bananas, oranges, potatoes, tomatoes, and spinach.  Keep all follow-up visits as told by your doctor. This is important. Contact a doctor if:  You have belly (abdominal) pain that:  Gets worse.  Stays in one area (localizes).  You have a rash.  You have a stiff neck.  You get angry or annoyed more easily than normal (irritability).  You are more sleepy than normal.  You have a harder time waking up than normal.  You feel:  Weak.  Dizzy.  Very thirsty.  You have peed (urinated) only a small amount of very dark pee during 6-8 hours. Get help right away if:  You   have symptoms of very bad dehydration.  You cannot drink fluids without throwing up (vomiting).  Your symptoms get worse with treatment.  You have a fever.  You have a very bad headache.  You are throwing up or having watery poop (diarrhea) and it:  Gets worse.  Does not go away.  You have blood or something green (bile) in your throw-up.  You have blood in your poop (stool). This may cause poop to look black and tarry.  You have not peed in 6-8 hours.  You pass out (faint).  Your heart rate when you are sitting still is more than 100 beats a  minute.  You have trouble breathing. This information is not intended to replace advice given to you by your health care provider. Make sure you discuss any questions you have with your health care provider. Document Released: 10/14/2008 Document Revised: 07/08/2015 Document Reviewed: 02/11/2015 Elsevier Interactive Patient Education  2017 [employer]  

## 2016-03-23 ENCOUNTER — Ambulatory Visit (HOSPITAL_BASED_OUTPATIENT_CLINIC_OR_DEPARTMENT_OTHER): Payer: 59

## 2016-03-23 ENCOUNTER — Ambulatory Visit
Admission: RE | Admit: 2016-03-23 | Discharge: 2016-03-23 | Disposition: A | Discharge status: Home / Self Care | Payer: 59 | Source: Ambulatory Visit | Attending: Radiation Oncology | Admitting: Radiation Oncology

## 2016-03-23 ENCOUNTER — Other Ambulatory Visit (HOSPITAL_BASED_OUTPATIENT_CLINIC_OR_DEPARTMENT_OTHER): Payer: 59

## 2016-03-23 ENCOUNTER — Ambulatory Visit: Payer: 59

## 2016-03-23 ENCOUNTER — Ambulatory Visit: Payer: 59 | Admitting: Nutrition

## 2016-03-23 VITALS — BP 128/68 | HR 88 | Temp 98.3°F | Resp 17

## 2016-03-23 DIAGNOSIS — Z510 Encounter for antineoplastic radiation therapy: Secondary | ICD-10-CM | POA: Diagnosis not present

## 2016-03-23 DIAGNOSIS — C023 Malignant neoplasm of anterior two-thirds of tongue, part unspecified: Secondary | ICD-10-CM | POA: Diagnosis not present

## 2016-03-23 DIAGNOSIS — Z5111 Encounter for antineoplastic chemotherapy: Secondary | ICD-10-CM

## 2016-03-23 LAB — COMPREHENSIVE METABOLIC PANEL
ALT: 17 U/L (ref 0–55)
AST: 22 U/L (ref 5–34)
Albumin: 3.7 g/dL (ref 3.5–5.0)
Alkaline Phosphatase: 113 U/L (ref 40–150)
Anion Gap: 9 mEq/L (ref 3–11)
BUN: 16.1 mg/dL (ref 7.0–26.0)
CALCIUM: 9.4 mg/dL (ref 8.4–10.4)
CHLORIDE: 95 meq/L — AB (ref 98–109)
CO2: 28 mEq/L (ref 22–29)
Creatinine: 1.4 mg/dL — ABNORMAL HIGH (ref 0.6–1.1)
EGFR: 45 mL/min/{1.73_m2} — ABNORMAL LOW (ref >90)
GLUCOSE: 121 mg/dL (ref 70–140)
Potassium: 3.9 mEq/L (ref 3.5–5.1)
SODIUM: 133 meq/L — AB (ref 136–145)
Total Bilirubin: 0.49 mg/dL (ref 0.20–1.20)
Total Protein: 7.5 g/dL (ref 6.4–8.3)

## 2016-03-23 LAB — CBC WITH DIFFERENTIAL/PLATELET
BASO%: 0.4 % (ref 0.0–2.0)
Basophils Absolute: 0 10*3/uL (ref 0.0–0.1)
EOS%: 2.9 % (ref 0.0–7.0)
Eosinophils Absolute: 0.2 10*3/uL (ref 0.0–0.5)
HEMATOCRIT: 33.1 % — AB (ref 34.8–46.6)
HGB: 11.8 g/dL (ref 11.6–15.9)
LYMPH#: 0.7 10*3/uL — AB (ref 0.9–3.3)
LYMPH%: 9 % — ABNORMAL LOW (ref 14.0–49.7)
MCH: 28.5 pg (ref 25.1–34.0)
MCHC: 35.6 g/dL (ref 31.5–36.0)
MCV: 80 fL (ref 79.5–101.0)
MONO#: 0.8 10*3/uL (ref 0.1–0.9)
MONO%: 9.7 % (ref 0.0–14.0)
NEUT#: 6.3 10*3/uL (ref 1.5–6.5)
NEUT%: 78 % — AB (ref 38.4–76.8)
PLATELETS: 246 10*3/uL (ref 145–400)
RBC: 4.14 10*6/uL (ref 3.70–5.45)
RDW: 15.4 % — ABNORMAL HIGH (ref 11.2–14.5)
WBC: 8.1 10*3/uL (ref 3.9–10.3)

## 2016-03-23 LAB — MAGNESIUM: MAGNESIUM: 2.2 mg/dL (ref 1.5–2.5)

## 2016-03-23 MED ORDER — PALONOSETRON HCL INJECTION 0.25 MG/5ML
INTRAVENOUS | Status: AC
  Filled 2016-03-23: qty 5

## 2016-03-23 MED ORDER — SODIUM CHLORIDE 0.9% FLUSH
10.0000 mL | INTRAVENOUS | Status: DC | PRN
  Administered 2016-03-23: 10 mL
  Filled 2016-03-23: qty 10

## 2016-03-23 MED ORDER — SODIUM CHLORIDE 0.9 % IV SOLN: Freq: Once | INTRAVENOUS | Status: DC

## 2016-03-23 MED ORDER — HEPARIN SOD (PORK) LOCK FLUSH 100 UNIT/ML IV SOLN
500.0000 [IU] | Freq: Once | INTRAVENOUS | Status: AC | PRN
  Administered 2016-03-23: 500 [IU]
  Filled 2016-03-23: qty 5

## 2016-03-23 MED ORDER — PALONOSETRON HCL INJECTION 0.25 MG/5ML
0.2500 mg | Freq: Once | INTRAVENOUS | Status: AC
  Administered 2016-03-23: 0.25 mg via INTRAVENOUS

## 2016-03-23 MED ORDER — SODIUM CHLORIDE 0.9 % IV SOLN
Freq: Once | INTRAVENOUS | Status: AC
  Administered 2016-03-23: 13:00:00 via INTRAVENOUS
  Filled 2016-03-23: qty 5

## 2016-03-23 MED ORDER — POTASSIUM CHLORIDE 2 MEQ/ML IV SOLN
Freq: Once | INTRAVENOUS | Status: AC
  Administered 2016-03-23: 10:00:00 via INTRAVENOUS
  Filled 2016-03-23: qty 10

## 2016-03-23 MED ORDER — SODIUM CHLORIDE 0.9 % IV SOLN
20.0000 mg/m2 | Freq: Once | INTRAVENOUS | Status: AC
  Administered 2016-03-23: 44 mg via INTRAVENOUS
  Filled 2016-03-23: qty 44

## 2016-03-23 MED ORDER — ONDANSETRON HCL 4 MG/2ML IJ SOLN
INTRAMUSCULAR | Status: AC
  Filled 2016-03-23: qty 4

## 2016-03-23 MED ORDER — ONDANSETRON HCL 4 MG/2ML IJ SOLN
8.0000 mg | Freq: Once | INTRAMUSCULAR | Status: AC
  Administered 2016-03-23: 8 mg via INTRAMUSCULAR

## 2016-03-23 MED ORDER — SODIUM CHLORIDE 0.9 % IV SOLN
Freq: Once | INTRAVENOUS | Status: AC
  Administered 2016-03-23: 10:00:00 via INTRAVENOUS

## 2016-03-23 NOTE — Progress Notes (Signed)
Nutrition follow-up completed with patient receiving chemotherapy for tongue cancer. Weight documented as 213.8 pounds on March 22 from 218.4 pounds March 13 and 242 pounds February 2017. Patient is status post PEG placement. Patient is trying to tolerate soft foods. She reports vomiting one time last week and then again today during infusion. Reports she is able to drink milk shakes and eat soft foods such as a chicken noodle soup and chicken livers with gravy.  Estimated nutrition needs: 2300-2500 calories, 120-135 grams protein, 2.5 L fluid.  Nutrition diagnosis: Unintended weight loss and severe malnutrition.  Continue.  Intervention: Educated patient on the importance of taking nausea medication after chemotherapy. Educated patient to begin Osmolite 1.5, one bottle via PEG, every 4 hours, 4 times a day with 60 cc free water before and after bolus feeding. Patient and son instructed and given written information. Teach back method used.  Monitoring, evaluation, goals: Patient will tolerate tube feeding plus oral intake to minimize further weight loss.  Next visit: Monday, March 26 during IV fluids.  **Disclaimer: This note was dictated with voice recognition software. Similar sounding words can inadvertently be transcribed and this note may contain transcription errors which may not have been corrected upon publication of note.**

## 2016-03-23 NOTE — Progress Notes (Signed)
Patient vomited in treatment area at 1100. PRN zofran 8mg  given according to time on MAR. Will continue to monitor. Patient stated she has not vomited since last week when she was here and vomited in the treatment area. Notified [employer] laboratory technician for Dr Alvy Bimler.

## 2016-03-23 NOTE — Patient Instructions (Signed)
[facility] Discharge Instructions for Patients Receiving Chemotherapy  Today you received the following chemotherapy agents Cisplatin  To help prevent nausea and vomiting after your treatment, we encourage you to take your nausea medication as directed   If you develop nausea and vomiting that is not controlled by your nausea medication, call the clinic.   BELOW ARE SYMPTOMS THAT SHOULD BE REPORTED IMMEDIATELY:  *FEVER GREATER THAN 100.5 F  *CHILLS WITH OR WITHOUT FEVER  NAUSEA AND VOMITING THAT IS NOT CONTROLLED WITH YOUR NAUSEA MEDICATION  *UNUSUAL SHORTNESS OF BREATH  *UNUSUAL BRUISING OR BLEEDING  TENDERNESS IN MOUTH AND THROAT WITH OR WITHOUT PRESENCE OF ULCERS  *URINARY PROBLEMS  *BOWEL PROBLEMS  UNUSUAL RASH Items with * indicate a potential emergency and should be followed up as soon as possible.  Feel free to call the clinic you have any questions or concerns. The clinic phone number is [phone].  Please show the CHEMO ALERT CARD at check-in to the Emergency Department and triage nurse.   Cisplatin injection What is this medicine? CISPLATIN (SIS pla tin) is a chemotherapy drug. It targets fast dividing cells, like cancer cells, and causes these cells to die. This medicine is used to treat many types of cancer like bladder, ovarian, and testicular cancers. This medicine may be used for other purposes; ask your health care provider or pharmacist if you have questions. COMMON BRAND NAME(S): Platinol, Platinol -AQ What should I tell my health care provider before I take this medicine? They need to know if you have any of these conditions: -blood disorders -hearing problems -kidney disease -recent or ongoing radiation therapy -an unusual or allergic reaction to cisplatin, carboplatin, other chemotherapy, other medicines, foods, dyes, or preservatives -pregnant or trying to get pregnant -breast-feeding How should I use this medicine? This drug  is given as an infusion into a vein. It is administered in a hospital or clinic by a specially trained health care professional. Talk to your pediatrician regarding the use of this medicine in children. Special care may be needed. Overdosage: If you think you have taken too much of this medicine contact a poison control center or emergency room at once. NOTE: This medicine is only for you. Do not share this medicine with others. What if I miss a dose? It is important not to miss a dose. Call your doctor or health care professional if you are unable to keep an appointment. What may interact with this medicine? -dofetilide -foscarnet -medicines for seizures -medicines to increase blood counts like filgrastim, pegfilgrastim, sargramostim -probenecid -pyridoxine used with altretamine -rituximab -some antibiotics like amikacin, gentamicin, neomycin, polymyxin B, streptomycin, tobramycin -sulfinpyrazone -vaccines -zalcitabine Talk to your doctor or health care professional before taking any of these medicines: -acetaminophen -aspirin -ibuprofen -ketoprofen -naproxen This list may not describe all possible interactions. Give your health care provider a list of all the medicines, herbs, non-prescription drugs, or dietary supplements you use. Also tell them if you smoke, drink alcohol, or use illegal drugs. Some items may interact with your medicine. What should I watch for while using this medicine? Your condition will be monitored carefully while you are receiving this medicine. You will need important blood work done while you are taking this medicine. This drug may make you feel generally unwell. This is not uncommon, as chemotherapy can affect healthy cells as well as cancer cells. Report any side effects. Continue your course of treatment even though you feel ill unless your doctor tells you to stop.   In some cases, you may be given additional medicines to help with side effects. Follow all  directions for their use. Call your doctor or health care professional for advice if you get a fever, chills or sore throat, or other symptoms of a cold or flu. Do not treat yourself. This drug decreases your body's ability to fight infections. Try to avoid being around people who are sick. This medicine may increase your risk to bruise or bleed. Call your doctor or health care professional if you notice any unusual bleeding. Be careful brushing and flossing your teeth or using a toothpick because you may get an infection or bleed more easily. If you have any dental work done, tell your dentist you are receiving this medicine. Avoid taking products that contain aspirin, acetaminophen, ibuprofen, naproxen, or ketoprofen unless instructed by your doctor. These medicines may hide a fever. Do not become pregnant while taking this medicine. Women should inform their doctor if they wish to become pregnant or think they might be pregnant. There is a potential for serious side effects to an unborn child. Talk to your health care professional or pharmacist for more information. Do not breast-feed an infant while taking this medicine. Drink fluids as directed while you are taking this medicine. This will help protect your kidneys. Call your doctor or health care professional if you get diarrhea. Do not treat yourself. What side effects may I notice from receiving this medicine? Side effects that you should report to your doctor or health care professional as soon as possible: -allergic reactions like skin rash, itching or hives, swelling of the face, lips, or tongue -signs of infection - fever or chills, cough, sore throat, pain or difficulty passing urine -signs of decreased platelets or bleeding - bruising, pinpoint red spots on the skin, black, tarry stools, nosebleeds -signs of decreased red blood cells - unusually weak or tired, fainting spells, lightheadedness -breathing problems -changes in  hearing -gout pain -low blood counts - This drug may decrease the number of white blood cells, red blood cells and platelets. You may be at increased risk for infections and bleeding. -nausea and vomiting -pain, swelling, redness or irritation at the injection site -pain, tingling, numbness in the hands or feet -problems with balance, movement -trouble passing urine or change in the amount of urine Side effects that usually do not require medical attention (report to your doctor or health care professional if they continue or are bothersome): -changes in vision -loss of appetite -metallic taste in the mouth or changes in taste This list may not describe all possible side effects. Call your doctor for medical advice about side effects. You may report side effects to FDA at 1-800-FDA-1088. Where should I keep my medicine? This drug is given in a hospital or clinic and will not be stored at home. NOTE: This sheet is a summary. It may not cover all possible information. If you have questions about this medicine, talk to your doctor, pharmacist, or health care provider.  2018 Elsevier/Gold Standard (2007-03-25 14:40:54)    

## 2016-03-24 ENCOUNTER — Ambulatory Visit (HOSPITAL_BASED_OUTPATIENT_CLINIC_OR_DEPARTMENT_OTHER): Payer: 59

## 2016-03-24 VITALS — BP 134/72 | HR 93 | Temp 98.5°F | Resp 17

## 2016-03-24 DIAGNOSIS — C023 Malignant neoplasm of anterior two-thirds of tongue, part unspecified: Secondary | ICD-10-CM | POA: Diagnosis not present

## 2016-03-24 MED ORDER — SODIUM CHLORIDE 0.9% FLUSH
10.0000 mL | INTRAVENOUS | Status: DC | PRN
  Administered 2016-03-24: 10 mL
  Filled 2016-03-24: qty 10

## 2016-03-24 MED ORDER — ONDANSETRON HCL 4 MG/2ML IJ SOLN
INTRAMUSCULAR | Status: AC
  Filled 2016-03-24: qty 4

## 2016-03-24 MED ORDER — SODIUM CHLORIDE 0.9 % IV SOLN
Freq: Once | INTRAVENOUS | Status: AC
  Administered 2016-03-24: 11:00:00 via INTRAVENOUS

## 2016-03-24 MED ORDER — HEPARIN SOD (PORK) LOCK FLUSH 100 UNIT/ML IV SOLN
500.0000 [IU] | Freq: Once | INTRAVENOUS | Status: AC | PRN
  Administered 2016-03-24: 500 [IU]
  Filled 2016-03-24: qty 5

## 2016-03-24 MED ORDER — SODIUM CHLORIDE 0.9 % IV SOLN
Freq: Once | INTRAVENOUS | Status: DC
  Administered 2016-03-24: 11:00:00 via INTRAVENOUS

## 2016-03-26 ENCOUNTER — Encounter: Payer: Self-pay | Admitting: Radiation Oncology

## 2016-03-26 ENCOUNTER — Ambulatory Visit (HOSPITAL_BASED_OUTPATIENT_CLINIC_OR_DEPARTMENT_OTHER): Payer: 59 | Admitting: Nurse Practitioner

## 2016-03-26 ENCOUNTER — Ambulatory Visit
Admission: RE | Admit: 2016-03-26 | Discharge: 2016-03-26 | Disposition: A | Discharge status: Home / Self Care | Payer: 59 | Source: Ambulatory Visit | Attending: Radiation Oncology | Admitting: Radiation Oncology

## 2016-03-26 ENCOUNTER — Ambulatory Visit: Payer: 59 | Admitting: Nutrition

## 2016-03-26 VITALS — BP 142/73 | HR 95 | Temp 98.5°F | Resp 18 | Ht 67.0 in | Wt 218.2 lb

## 2016-03-26 DIAGNOSIS — C770 Secondary and unspecified malignant neoplasm of lymph nodes of head, face and neck: Secondary | ICD-10-CM | POA: Diagnosis not present

## 2016-03-26 DIAGNOSIS — E46 Unspecified protein-calorie malnutrition: Secondary | ICD-10-CM

## 2016-03-26 DIAGNOSIS — C023 Malignant neoplasm of anterior two-thirds of tongue, part unspecified: Secondary | ICD-10-CM | POA: Diagnosis not present

## 2016-03-26 DIAGNOSIS — R634 Abnormal weight loss: Secondary | ICD-10-CM

## 2016-03-26 DIAGNOSIS — Z510 Encounter for antineoplastic radiation therapy: Secondary | ICD-10-CM | POA: Diagnosis not present

## 2016-03-26 MED ORDER — HEPARIN SOD (PORK) LOCK FLUSH 100 UNIT/ML IV SOLN
500.0000 [IU] | Freq: Once | INTRAVENOUS | Status: AC | PRN
  Administered 2016-03-26: 500 [IU]
  Filled 2016-03-26: qty 5

## 2016-03-26 MED ORDER — ONDANSETRON HCL 4 MG/2ML IJ SOLN: 8.0000 mg | Freq: Once | INTRAMUSCULAR | Status: DC

## 2016-03-26 MED ORDER — SODIUM CHLORIDE 0.9% FLUSH
10.0000 mL | INTRAVENOUS | Status: DC | PRN
  Administered 2016-03-26: 10 mL
  Filled 2016-03-26: qty 10

## 2016-03-26 MED ORDER — SODIUM CHLORIDE 0.9 % IV SOLN
1000.0000 mL | Freq: Once | INTRAVENOUS | Status: AC
  Administered 2016-03-26: 1000 mL via INTRAVENOUS

## 2016-03-26 NOTE — Patient Instructions (Signed)
Dehydration, Adult Dehydration is a condition in which there is not enough fluid or water in the body. This happens when you lose more fluids than you take in. Important organs, such as the kidneys, brain, and heart, cannot function without a proper amount of fluids. Any loss of fluids from the body can lead to dehydration. Dehydration can range from mild to severe. This condition should be treated right away to prevent it from becoming severe. What are the causes? This condition may be caused by:  Vomiting.  Diarrhea.  Excessive sweating, such as from heat exposure or exercise.  Not drinking enough fluid, especially:  When ill.  While doing activity that requires a lot of energy.  Excessive urination.  Fever.  Infection.  Certain medicines, such as medicines that cause the body to lose excess fluid (diuretics).  Inability to access safe drinking water.  Reduced physical ability to get adequate water and food. What increases the risk? This condition is more likely to develop in people:  Who have a poorly controlled long-term (chronic) illness, such as diabetes, heart disease, or kidney disease.  Who are age 65 or older.  Who are disabled.  Who live in a place with high altitude.  Who play endurance sports. What are the signs or symptoms? Symptoms of mild dehydration may include:   Thirst.  Dry lips.  Slightly dry mouth.  Dry, warm skin.  Dizziness. Symptoms of moderate dehydration may include:   Very dry mouth.  Muscle cramps.  Dark urine. Urine may be the color of tea.  Decreased urine production.  Decreased tear production.  Heartbeat that is irregular or faster than normal (palpitations).  Headache.  Light-headedness, especially when you stand up from a sitting position.  Fainting (syncope). Symptoms of severe dehydration may include:   Changes in skin, such as:  Cold and clammy skin.  Blotchy (mottled) or pale skin.  Skin that does  not quickly return to normal after being lightly pinched and released (poor skin turgor).  Changes in body fluids, such as:  Extreme thirst.  No tear production.  Inability to sweat when body temperature is high, such as in hot weather.  Very little urine production.  Changes in vital signs, such as:  Weak pulse.  Pulse that is more than 100 beats a minute when sitting still.  Rapid breathing.  Low blood pressure.  Other changes, such as:  Sunken eyes.  Cold hands and feet.  Confusion.  Lack of energy (lethargy).  Difficulty waking up from sleep.  Short-term weight loss.  Unconsciousness. How is this diagnosed? This condition is diagnosed based on your symptoms and a physical exam. Blood and urine tests may be done to help confirm the diagnosis. How is this treated? Treatment for this condition depends on the severity. Mild or moderate dehydration can often be treated at home. Treatment should be started right away. Do not wait until dehydration becomes severe. Severe dehydration is an emergency and it needs to be treated in a hospital. Treatment for mild dehydration may include:   Drinking more fluids.  Replacing salts and minerals in your blood (electrolytes) that you may have lost. Treatment for moderate dehydration may include:   Drinking an oral rehydration solution (ORS). This is a drink that helps you replace fluids and electrolytes (rehydrate). It can be found at pharmacies and retail stores. Treatment for severe dehydration may include:   Receiving fluids through an IV tube.  Receiving an electrolyte solution through a feeding tube that is   passed through your nose and into your stomach (nasogastric tube, or NG tube).  Correcting any abnormalities in electrolytes.  Treating the underlying cause of dehydration. Follow these instructions at home:  If directed by your health care provider, drink an ORS:  Make an ORS by following instructions on the  package.  Start by drinking small amounts, about  cup (120 mL) every 5-10 minutes.  Slowly increase how much you drink until you have taken the amount recommended by your health care provider.  Drink enough clear fluid to keep your urine clear or pale yellow. If you were told to drink an ORS, finish the ORS first, then start slowly drinking other clear fluids. Drink fluids such as:  Water. Do not drink only water. Doing that can lead to having too little salt (sodium) in the body (hyponatremia).  Ice chips.  Fruit juice that you have added water to (diluted fruit juice).  Low-calorie sports drinks.  Avoid:  Alcohol.  Drinks that contain a lot of sugar. These include high-calorie sports drinks, fruit juice that is not diluted, and soda.  Caffeine.  Foods that are greasy or contain a lot of fat or sugar.  Take over-the-counter and prescription medicines only as told by your health care provider.  Do not take sodium tablets. This can lead to having too much sodium in the body (hypernatremia).  Eat foods that contain a healthy balance of electrolytes, such as bananas, oranges, potatoes, tomatoes, and spinach.  Keep all follow-up visits as told by your health care provider. This is important. Contact a health care provider if:  You have abdominal pain that:  Gets worse.  Stays in one area (localizes).  You have a rash.  You have a stiff neck.  You are more irritable than usual.  You are sleepier or more difficult to wake up than usual.  You feel weak or dizzy.  You feel very thirsty.  You have urinated only a small amount of very dark urine over 6-8 hours. Get help right away if:  You have symptoms of severe dehydration.  You cannot drink fluids without vomiting.  Your symptoms get worse with treatment.  You have a fever.  You have a severe headache.  You have vomiting or diarrhea that:  Gets worse.  Does not go away.  You have blood or green matter  (bile) in your vomit.  You have blood in your stool. This may cause stool to look black and tarry.  You have not urinated in 6-8 hours.  You faint.  Your heart rate while sitting still is over 100 beats a minute.  You have trouble breathing. This information is not intended to replace advice given to you by your health care provider. Make sure you discuss any questions you have with your health care provider. Document Released: 12/18/2004 Document Revised: 07/15/2015 Document Reviewed: 02/11/2015 Elsevier Interactive Patient Education  2017 [employer]  

## 2016-03-26 NOTE — Progress Notes (Signed)
Follow-up completed with patient receiving IV fluids for tongue cancer. Patient reports she had a good weekend and ate well. Weight improved documented as 218 pounds today from 213.8 pounds March 22. Patient continues to flush her feeding tube with water. Reports she was able to eat well so has not been using her feeding tube. States she is drinking 80 ounces of liquids throughout the day. Denies nutrition impact symptoms today.  Nutrition diagnosis:  Unintended weight loss and severe malnutrition improved.  Intervention: Encouraged patient to continue strategies for small frequent meals and snacks with high-calorie, high-protein foods. Discouraged weight loss during treatment. Recommended oral nutrition supplements for homemade shakes and smoothies as needed to maintain weight. Educated patient to continue flushing feeding tube with water once daily. Questions were answered.  Teach back method used.  Monitoring, evaluation, goals: Patient will work to increase calories and protein to minimize weight loss.  Next visit: Friday, March 30, during infusion.  **Disclaimer: This note was dictated with voice recognition software. Similar sounding words can inadvertently be transcribed and this note may contain transcription errors which may not have been corrected upon publication of note.**

## 2016-03-26 NOTE — Progress Notes (Signed)
Paperwork (FMLA) faxed to attn [employer] @ [phone], conf rec. Copy given to patient at treatment machine 03/26/16

## 2016-03-26 NOTE — Progress Notes (Signed)
Last note wrong patient  Paperwork ([employer]) disability, faxed to [phone], conf rec. Copy given to patient at treatment machine

## 2016-03-26 NOTE — Addendum Note (Signed)
Addended by: Tora Kindred on: 03/26/2016 07:49 AM   Modules accepted: Orders

## 2016-03-26 NOTE — Progress Notes (Signed)
RN visit for IV fluids only 

## 2016-03-27 ENCOUNTER — Ambulatory Visit
Admission: RE | Admit: 2016-03-27 | Discharge: 2016-03-27 | Disposition: A | Discharge status: Home / Self Care | Payer: 59 | Source: Ambulatory Visit | Attending: Radiation Oncology | Admitting: Radiation Oncology

## 2016-03-27 DIAGNOSIS — Z510 Encounter for antineoplastic radiation therapy: Secondary | ICD-10-CM | POA: Diagnosis not present

## 2016-03-28 ENCOUNTER — Other Ambulatory Visit: Payer: Self-pay | Admitting: Otolaryngology

## 2016-03-28 ENCOUNTER — Other Ambulatory Visit (HOSPITAL_BASED_OUTPATIENT_CLINIC_OR_DEPARTMENT_OTHER): Payer: 59

## 2016-03-28 ENCOUNTER — Ambulatory Visit
Admission: RE | Admit: 2016-03-28 | Discharge: 2016-03-28 | Disposition: A | Discharge status: Home / Self Care | Payer: 59 | Source: Ambulatory Visit | Attending: Radiation Oncology | Admitting: Radiation Oncology

## 2016-03-28 DIAGNOSIS — C023 Malignant neoplasm of anterior two-thirds of tongue, part unspecified: Secondary | ICD-10-CM | POA: Diagnosis not present

## 2016-03-28 DIAGNOSIS — R221 Localized swelling, mass and lump, neck: Secondary | ICD-10-CM

## 2016-03-28 DIAGNOSIS — Z510 Encounter for antineoplastic radiation therapy: Secondary | ICD-10-CM | POA: Diagnosis not present

## 2016-03-28 DIAGNOSIS — C021 Malignant neoplasm of border of tongue: Secondary | ICD-10-CM

## 2016-03-28 LAB — CBC WITH DIFFERENTIAL/PLATELET
BASO%: 1.1 % (ref 0.0–2.0)
Basophils Absolute: 0.1 10*3/uL (ref 0.0–0.1)
EOS%: 2 % (ref 0.0–7.0)
Eosinophils Absolute: 0.1 10*3/uL (ref 0.0–0.5)
HCT: 35.3 % (ref 34.8–46.6)
HEMOGLOBIN: 12.4 g/dL (ref 11.6–15.9)
LYMPH#: 0.5 10*3/uL — AB (ref 0.9–3.3)
LYMPH%: 7.3 % — ABNORMAL LOW (ref 14.0–49.7)
MCH: 29.3 pg (ref 25.1–34.0)
MCHC: 35.1 g/dL (ref 31.5–36.0)
MCV: 83.4 fL (ref 79.5–101.0)
MONO#: 0.6 10*3/uL (ref 0.1–0.9)
MONO%: 8.6 % (ref 0.0–14.0)
NEUT#: 6.1 10*3/uL (ref 1.5–6.5)
NEUT%: 81 % — ABNORMAL HIGH (ref 38.4–76.8)
Platelets: 202 10*3/uL (ref 145–400)
RBC: 4.23 10*6/uL (ref 3.70–5.45)
RDW: 15.2 % — AB (ref 11.2–14.5)
WBC: 7.5 10*3/uL (ref 3.9–10.3)

## 2016-03-28 LAB — COMPREHENSIVE METABOLIC PANEL
ALK PHOS: 109 U/L (ref 40–150)
ALT: 18 U/L (ref 0–55)
AST: 27 U/L (ref 5–34)
Albumin: 3.6 g/dL (ref 3.5–5.0)
Anion Gap: 9 mEq/L (ref 3–11)
BUN: 19.1 mg/dL (ref 7.0–26.0)
CO2: 29 mEq/L (ref 22–29)
Calcium: 9.5 mg/dL (ref 8.4–10.4)
Chloride: 99 mEq/L (ref 98–109)
Creatinine: 1.6 mg/dL — ABNORMAL HIGH (ref 0.6–1.1)
EGFR: 40 mL/min/{1.73_m2} — AB (ref >90)
Glucose: 95 mg/dl (ref 70–140)
Potassium: 4.3 mEq/L (ref 3.5–5.1)
SODIUM: 137 meq/L (ref 136–145)
TOTAL PROTEIN: 7.2 g/dL (ref 6.4–8.3)
Total Bilirubin: 0.85 mg/dL (ref 0.20–1.20)

## 2016-03-28 LAB — MAGNESIUM: Magnesium: 1.6 mg/dl (ref 1.5–2.5)

## 2016-03-29 ENCOUNTER — Encounter: Payer: Self-pay | Admitting: Hematology and Oncology

## 2016-03-29 ENCOUNTER — Ambulatory Visit
Admission: RE | Admit: 2016-03-29 | Discharge: 2016-03-29 | Disposition: A | Discharge status: Home / Self Care | Payer: 59 | Source: Ambulatory Visit | Attending: Radiation Oncology | Admitting: Radiation Oncology

## 2016-03-29 ENCOUNTER — Encounter: Payer: Self-pay | Admitting: *Deleted

## 2016-03-29 ENCOUNTER — Ambulatory Visit (HOSPITAL_BASED_OUTPATIENT_CLINIC_OR_DEPARTMENT_OTHER): Payer: 59 | Admitting: Hematology and Oncology

## 2016-03-29 DIAGNOSIS — C023 Malignant neoplasm of anterior two-thirds of tongue, part unspecified: Secondary | ICD-10-CM

## 2016-03-29 DIAGNOSIS — R634 Abnormal weight loss: Secondary | ICD-10-CM

## 2016-03-29 DIAGNOSIS — R471 Dysarthria and anarthria: Secondary | ICD-10-CM | POA: Diagnosis not present

## 2016-03-29 DIAGNOSIS — N179 Acute kidney failure, unspecified: Secondary | ICD-10-CM

## 2016-03-29 DIAGNOSIS — Z510 Encounter for antineoplastic radiation therapy: Secondary | ICD-10-CM | POA: Diagnosis not present

## 2016-03-29 NOTE — Progress Notes (Signed)
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 Tongue, biopsy, Anterior deep margin left SQUAMOUS CELL CARCINOMA 3. Tongue, biopsy, Posterior deep margin left NEGATIVE FOR CARCINOMA 4. Tongue, biopsy, Posterior margin left SQUAMOUS CELL CARCINOMA 5. Tongue, excisional biopsy, New anterior deep margin NEGATIVE FOR CARCINOMA 6. Tongue, excisional biopsy, New posterior deep margin SMALL FOCUS OF SQUAMOUS CELL CARCINOMA (0.1 CM, ONLY PRESENT AT THE DEEPER PERMINENT SECTION) 7. Tongue, resection for tumor, Left lateral INFILTRATIVE KERATINIZED SQUAMOUS CELL CARCINOMA (3.2 CM) THE TUMOR INVADES UNDERNEATH MUSCLE OF THE TONGUE (2.0 CM, PT2) LYMPHOVASCULAR AND PERINEURAL INVASION IDENTIFIED SQUAMOUS CELL CARCINOMA PRESENTED AT (FINAL MARGINS REFER TO PART 2-6) LATERAL INKED MARGIN (1.0 CM) MEDIAL INKED MARGIN (0.6 CM)      02/08/2016 Surgery    SURGICAL PROCEDURES: 1. Left hemiglossectomy with primary closure. 2. Left selective neck dissection (zones I-III).      03/02/2016 Imaging    CT chest with contrast: No evidence of metastatic disease in the chest. 2. Aortic atherosclerosis.       03/02/2016 Imaging    CT neck with contrast showed : Newly enlarged and round 10 mm right  level 1b lymph node (series 5, image 45). The level 1A nodes remain normal. This right 1 B node is nonspecific but suspicious for progressive nodal metastatic disease. It might be amenable to Ultrasound-guided FNA. 2. Interval left hemiglossectomy and selective left neck dissection with confluent indeterminate soft tissue from the anterior left submandibular space through the left carotid space and obscuring the left level 1B, level 2, and level IIIa nodal stations. This study will serve as a new postoperative baseline of this area. Attention directed on close interval follow-up. 3. Mild soft tissue thickening also along the left lateral tongue resection margin. Attention directed on followup. 4. Chest CT today reported separately      03/14/2016 Procedure    She has normal baseline hearing test      03/15/2016 Procedure    1. Successful placement of a right internal jugular approach power injectable Port-A-Cath. The Port a catheter is ready for immediate use. 2. Successful fluoroscopic insertion of a 20-French pull-through gastrostomy tube. The gastrostomy may be used immediately for medication administration and may be utilized in 24 hrs for the initiation of feeds.      03/15/2016 -  Radiation Therapy    She received concurrent radiation treatment      03/16/2016 -  Chemotherapy    She received weekly cisplatin        INTERVAL HISTORY: Please see below for problem oriented charting. She is seen prior to cycle 3 of chemotherapy She has lost some weight She is eating well and drinking 4 bottles of liquid per day Denies hearing loss No peripheral neuropathy Denies pain, mucositis or dysphagia  REVIEW OF SYSTEMS:   Constitutional: Denies fevers, chills Eyes: Denies blurriness  of vision Ears, nose, mouth, throat, and face: Denies mucositis or sore throat Respiratory: Denies cough, dyspnea or wheezes Cardiovascular: Denies palpitation, chest discomfort or lower extremity  swelling Gastrointestinal:  Denies nausea, heartburn or change in bowel habits Skin: Denies abnormal skin rashes Lymphatics: Denies new lymphadenopathy or easy bruising Neurological:Denies numbness, tingling or new weaknesses Behavioral/Psych: Mood is stable, no new changes  All other systems were reviewed with the patient and are negative.  I have reviewed the past medical history, past surgical history, social history and family history with the patient and they are unchanged from previous note.  ALLERGIES:  has No Known Allergies.  MEDICATIONS:  Current Outpatient Prescriptions  Medication Sig Dispense Refill  . ALPRAZolam (XANAX) 0.5 MG tablet Take 0.5 mg by mouth at bedtime as needed for sleep.     Marland Kitchen atorvastatin (LIPITOR) 10 MG tablet Take 10 mg by mouth daily at 6 PM.     . buPROPion (ZYBAN) 150 MG 12 hr tablet Take 150 mg by mouth daily.     . chlorhexidine (PERIDEX) 0.12 % solution Rinse with 15 mls three times daily for 30 seconds. Use after breakfast, dinner, and at bedtime. Spit out excess. Do not swallow. 480 mL prn  . fluconazole (DIFLUCAN) 100 MG tablet Take 2 tablets today, then 1 tablet daily x 20 more days. Hold Atorvastatin while on this drug. 22 tablet 0  . levothyroxine (SYNTHROID, LEVOTHROID) 75 MCG tablet Take 75 mcg by mouth daily before breakfast.     . lidocaine (XYLOCAINE) 2 % solution Patient: Mix 1part 2% viscous lidocaine, 1part H20. Swish and swallow 6m of this mixture, 340m before meals and at bedtime, up to QID 100 mL 5  . lidocaine-prilocaine (EMLA) cream Apply to affected area once 30 g 3  . Morphine Sulfate (MORPHINE CONCENTRATE) 10 mg / 0.5 ml concentrated solution Take 0.5 mLs (10 mg total) by mouth every 2 (two) hours as needed for severe pain. 120 mL 0  . naproxen sodium (ANAPROX) 220 MG tablet Take 220 mg by mouth 2 (two) times daily as needed (pain).    . ondansetron (ZOFRAN) 8 MG tablet Take 1 tablet (8 mg total) by mouth 2 (two) times daily as  needed. Start on the third day after chemotherapy. 30 tablet 1  . prochlorperazine (COMPAZINE) 10 MG tablet Take 1 tablet (10 mg total) by mouth every 6 (six) hours as needed (Nausea or vomiting). 30 tablet 1  . senna (SENOKOT) 8.6 MG tablet Take 1 tablet by mouth 2 (two) times daily.    . Marland Kitchenriamcinolone (KENALOG) 0.1 % paste APPLY TO AFFECTED AREA    . zolpidem (AMBIEN) 10 MG tablet Take 10 mg by mouth at bedtime.     . fentaNYL (DURAGESIC - DOSED MCG/HR) 25 MCG/HR patch Place 1 patch (25 mcg total) onto the skin every 3 (three) days. (Patient not taking: Reported on 03/19/2016) 5 patch 0  . hydrochlorothiazide (MICROZIDE) 12.5 MG capsule Take 12.5 mg by mouth daily.     . Marland KitchenYDROcodone-acetaminophen (HYCET) 7.5-325 mg/15 ml solution Take 10-15 mLs by mouth every 4 (four) hours as needed for moderate pain. (Patient not taking: Reported on 03/22/2016) 473 mL 0   No current facility-administered medications for this visit.    Facility-Administered Medications Ordered in Other Visits  Medication Dose Route Frequency Provider Last Rate Last Dose  . ondansetron (ZOFRAN) injection 8 mg  8 mg Intravenous Once NiHeath LarkMD        PHYSICAL EXAMINATION: ECOG PERFORMANCE  STATUS: 1 - Symptomatic but completely ambulatory  Vitals:   03/29/16 1007  BP: 122/63  Pulse: 98  Resp: 18  Temp: 98.4 F (36.9 C)   Filed Weights   03/29/16 1007  Weight: 212 lb 14.4 oz (96.6 kg)    GENERAL:alert, no distress and comfortable SKIN: skin color, texture, turgor are normal, no rashes or significant lesions EYES: normal, Conjunctiva are pink and non-injected, sclera clear OROPHARYNX: No mucositis.  Noted tongue resection   NECK: supple, thyroid normal size, non-tender, without nodularity LYMPH:  no palpable lymphadenopathy in the cervical, axillary or inguinal LUNGS: clear to auscultation and percussion with normal breathing effort HEART: regular rate & rhythm and no murmurs and no lower extremity  edema ABDOMEN:abdomen soft, non-tender and normal bowel sounds Musculoskeletal:no cyanosis of digits and no clubbing  NEURO: alert & oriented x 3 with dysarthria, no focal motor/sensory deficits  LABORATORY DATA:  I have reviewed the data as listed    Component Value Date/Time   NA 137 03/28/2016 1028   K 4.3 03/28/2016 1028   CL 99 (L) 02/07/2016 0836   CO2 29 03/28/2016 1028   GLUCOSE 95 03/28/2016 1028   BUN 19.1 03/28/2016 1028   CREATININE 1.6 (H) 03/28/2016 1028   CALCIUM 9.5 03/28/2016 1028   PROT 7.2 03/28/2016 1028   ALBUMIN 3.6 03/28/2016 1028   AST 27 03/28/2016 1028   ALT 18 03/28/2016 1028   ALKPHOS 109 03/28/2016 1028   BILITOT 0.85 03/28/2016 1028   GFRNONAA 45 (L) 02/08/2016 1859   GFRAA 52 (L) 02/08/2016 1859    No results found for: SPEP, UPEP  Lab Results  Component Value Date   WBC 7.5 03/28/2016   NEUTROABS 6.1 03/28/2016   HGB 12.4 03/28/2016   HCT 35.3 03/28/2016   MCV 83.4 03/28/2016   PLT 202 03/28/2016      Chemistry      Component Value Date/Time   NA 137 03/28/2016 1028   K 4.3 03/28/2016 1028   CL 99 (L) 02/07/2016 0836   CO2 29 03/28/2016 1028   BUN 19.1 03/28/2016 1028   CREATININE 1.6 (H) 03/28/2016 1028      Component Value Date/Time   CALCIUM 9.5 03/28/2016 1028   ALKPHOS 109 03/28/2016 1028   AST 27 03/28/2016 1028   ALT 18 03/28/2016 1028   BILITOT 0.85 03/28/2016 1028       RADIOGRAPHIC STUDIES: I have personally reviewed the radiological images as listed and agreed with the findings in the report. 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 Status post surgical resection, left hemiglossectomy and selective neck dissection (zones 1 through 3). Subsequent encounter. EXAM: CT NECK WITH CONTRAST TECHNIQUE: Multidetector CT imaging of the neck was performed using the standard protocol following the bolus  administration of intravenous contrast. CONTRAST:  34m ISOVUE-300 IOPAMIDOL (ISOVUE-300) INJECTION 61% in conjunction with contrast enhanced imaging of the chest reported separately. COMPARISON:  Pretreatment neck CT 01/05/2016 FINDINGS: Pharynx and larynx: Motion artifact at the subglottic larynx. Laryngeal soft tissue contours appear within normal limits. Pharyngeal soft tissue contours are within normal limits. Parapharyngeal and retropharyngeal spaces appear normal. Salivary glands: The left submandibular gland appears to be surgically absent, with an indistinct area of generalized soft tissue thickening which extends from the posterior left sublingual space/anterior submandibular space posteriorly to the left carotid space. See series 5, image 41. This was the same region  of the of the left level 2/3 malignant 17 mm lymph node on the pretreatment study. The postoperative soft tissue thickening extends cephalad to the inferior parotid space and caudally to the level of the thyroid cartilage. The soft tissue changes are inseparable from the anterior residual left sternocleidomastoid muscle. There is mild soft tissue thickening and asymmetry along the residual left posterolateral oral tongue near the left retromolar trigone as seen on series 5, image 30. This is near the area of the tongue mass delineated on the pretreatment CT. The anterior sublingual space, the right submandibular space and the right submandibular gland are stable and within normal limits. See right level 1 lymph node findings below. Aside from the postoperative changes to the inferior left parotid space the parotid glands are stable and within normal limits. Thyroid: Stable, negative. Lymph nodes: Abnormal right level 1B lymph node is rounded and measures 9-10 mm short axis now (series 5, image 45), versus 4 mm or smaller in January. Small level 1a lymph nodes remain normal on series 5, image 51. The left level 1B, level 2, and level 3a nodal  stations are obscured by the confluent soft tissue described above (series 5, image 41). Small level IIIb lymph nodes remain normal (i.e. Series 5, image 44). Right level 2, right level 3, bilateral level 4 and bilateral level 5 nodes are stable and within normal limits. Vascular: Major vascular structures in the neck and at the skullbase remain patent, including the left internal jugular vein which is diminutive. The postoperative confluent soft tissue on the left does involve the left carotid space, but there is no mass effect on the left carotid. Limited intracranial: Negative. Visualized orbits: Negative. Mastoids and visualized paranasal sinuses: Visualized paranasal sinuses and mastoids are stable and well pneumatized. Skeleton: No acute osseous abnormality identified. Mandible is intact. Upper chest: Chest CT today reported separately. IMPRESSION: 1. Newly enlarged and round 10 mm right level 1b lymph node (series 5, image 45). The level 1A nodes remain normal. This right 1 B node is nonspecific but suspicious for progressive nodal metastatic disease. It might be amenable to Ultrasound-guided FNA. 2. Interval left hemiglossectomy and selective left neck dissection with confluent indeterminate soft tissue from the anterior left submandibular space through the left carotid space and obscuring the left level 1B, level 2, and level IIIa nodal stations. This study will serve as a new postoperative baseline of this area. Attention directed on close interval follow-up. 3. Mild soft tissue thickening also along the left lateral tongue resection margin. Attention directed on followup. 4. Chest CT today reported separately. Electronically Signed   By: Genevie Ann M.D.   On: 03/02/2016 16:05   Ct Chest W Contrast  Result Date: 03/02/2016 CLINICAL DATA:  Left lateral tongue squamous cell carcinoma status post partial left glossectomy with neck dissection 02/08/2016. 3 of 20 regional lymph nodes in the neck positive for  metastatic disease. Chest staging. EXAM: CT CHEST WITH CONTRAST TECHNIQUE: Multidetector CT imaging of the chest was performed during intravenous contrast administration. CONTRAST:  2m ISOVUE-300 IOPAMIDOL (ISOVUE-300) INJECTION 61% COMPARISON:  05/24/2015 chest CT angiogram. FINDINGS: Cardiovascular: Normal heart size. No significant pericardial fluid/thickening. Atherosclerotic nonaneurysmal thoracic aorta (maximum diameter of the ascending thoracic aorta 3.7 cm, stable). Normal caliber pulmonary arteries. No central pulmonary emboli. Mediastinum/Nodes: Stable hypodense 0.8 cm left thyroid lobe nodule. Unremarkable esophagus. No pathologically enlarged axillary, mediastinal or hilar lymph nodes. Lungs/Pleura: No pneumothorax. No pleural effusion. Subpleural superior segment right lower lobe 3 mm solid  pulmonary nodule (series 8/ image 54), subpleural 3 mm anterior right middle lobe pulmonary nodule associated with the minor fissure (series 8/ image 74), subpleural anterior right lower lobe 3 mm solid pulmonary nodule (series 8/ image 88) and subpleural 3 mm basilar left lower lobe solid pulmonary nodule (series 8/ image 117) are all stable back to at least 02/03/2012 and considered benign. No acute consolidative airspace disease, lung masses or additional significant pulmonary nodules. Upper abdomen: Cholecystectomy. Hypodense subcentimeter anterior upper right renal cortical lesion is too small to characterize and not appreciably changed back to the 02/03/2012, most consistent with a benign renal cyst. Musculoskeletal: No aggressive appearing focal osseous lesions. Mild thoracic spondylosis. IMPRESSION: 1. No evidence of metastatic disease in the chest. 2. Aortic atherosclerosis. Electronically Signed   By: Ilona Sorrel M.D.   On: 03/02/2016 16:54   Ir Gastrostomy Tube Mod Sed  Result Date: 03/15/2016 INDICATION: History of tongue cancer. In need of durable intravenous access for chemotherapy  administration. Please perform percutaneous gastrostomy tube for enteric nutrition supplementation prior to the initiation of chemoradiation. EXAM: 1. IMPLANTED PORT A CATH PLACEMENT WITH ULTRASOUND AND FLUOROSCOPIC GUIDANCE 2. FLUOROSCOPIC GUIDED PERCUTANEOUS GASTROSTOMY TUBE PLACEMENT COMPARISON:  CHEST CT - 03/02/2016 MEDICATIONS: Ancef 2 gm IV; The antibiotic was administered within an appropriate time interval prior to skin puncture. ANESTHESIA/SEDATION: Moderate (conscious) sedation was employed during this procedure. A total of Versed 7 mg and Fentanyl 225 mcg was administered intravenously. Moderate Sedation Time: 43 minutes. The patient's level of consciousness and vital signs were monitored continuously by radiology nursing throughout the procedure under my direct supervision. CONTRAST:  20 cc Isovue 300, administered into the stomach lumen. FLUOROSCOPY TIME:  A total of 5 minutes, 12 seconds (279 mGy) fluoroscopy time was utilized for both procedures. COMPLICATIONS: None immediate. PROCEDURE: The procedures, risks, benefits, and alternatives were explained to the patient. Questions regarding the procedure were encouraged and answered. The patient understands and consents to both procedures. Attention was initially paid towards placement of the [facility] a Catheter. The right neck and chest were prepped with chlorhexidine in a sterile fashion, and a sterile drape was applied covering the operative field. Maximum barrier sterile technique with sterile gowns and gloves were used for the procedure. A timeout was performed prior to the initiation of the procedure. Local anesthesia was provided with 1% lidocaine with epinephrine. After creating a small venotomy incision, a micropuncture kit was utilized to access the internal jugular vein under direct, real-time ultrasound guidance. Ultrasound image documentation was performed. The microwire was kinked to measure appropriate catheter length. A subcutaneous port  pocket was then created along the upper chest wall utilizing a combination of sharp and blunt dissection. The pocket was irrigated with sterile saline. A single lumen ISP power injectable port was chosen for placement. The 8 Fr catheter was tunneled from the port pocket site to the venotomy incision. The port was placed in the pocket. The external catheter was trimmed to appropriate length. At the venotomy, an 8 Fr peel-away sheath was placed over a guidewire under fluoroscopic guidance. The catheter was then placed through the sheath and the sheath was removed. Final catheter positioning was confirmed and documented with a fluoroscopic spot radiograph. The port was accessed with a Huber needle, aspirated and flushed with heparinized saline. The venotomy site was closed with an interrupted 4-0 Vicryl suture. The port pocket incision was closed with interrupted 2-0 Vicryl suture and the skin was opposed with a running subcuticular 4-0 Vicryl suture. Dermabond  and Steri-strips were applied to both incisions. Dressings were placed. Next, attention was paid towards placement of the gastrostomy tube. The left upper quadrant was sterilely prepped and draped. An oral gastric catheter was inserted into the stomach under fluoroscopy. The existing nasogastric feeding tube was removed. The left costal margin and air / barium opacified transverse colon were identified and avoided. Air was injected into the stomach for insufflation and visualization under fluoroscopy. Under sterile conditions a 17 gauge trocar needle was utilized to access the stomach percutaneously beneath the left subcostal margin after the overlying soft tissues were anesthetized with 1% Lidocaine with epinephrine. Needle position was confirmed within the stomach with aspiration of air and injection of small amount of contrast. A single T tack was deployed for gastropexy. Over an Amplatz guide wire, a 9-French sheath was inserted into the stomach. A snare  device was utilized to capture the oral gastric catheter. The snare device was pulled retrograde from the stomach up the esophagus and out the oropharynx. The 20-French pull-through gastrostomy was connected to the snare device and pulled antegrade through the oropharynx down the esophagus into the stomach and then through the percutaneous tract external to the patient. The gastrostomy was assembled externally. Contrast injection confirms position in the stomach. Several spot radiographic images were obtained in various obliquities for documentation. Dressings were placed. The patient tolerated both above procedures well without immediate post procedural complication. FINDINGS: After catheter placement, the tip lies within the superior cavoatrial junction. The catheter aspirates and flushes normally and is ready for immediate use. After successful fluoroscopic guided placement, the gastrostomy tube is appropriately positioned with internal disc against the ventral aspect of the gastric lumen. IMPRESSION: 1. Successful placement of a right internal jugular approach power injectable Port-A-Cath. The Port a catheter is ready for immediate use. 2. Successful fluoroscopic insertion of a 20-French pull-through gastrostomy tube. The gastrostomy may be used immediately for medication administration and may be utilized in 24 hrs for the initiation of feeds. Electronically Signed   By: Sandi Mariscal M.D.   On: 03/15/2016 16:56   Ir US Guide Vasc Access Right  Result Date: 03/15/2016 INDICATION: History of tongue cancer. In need of durable intravenous access for chemotherapy administration. Please perform percutaneous gastrostomy tube for enteric nutrition supplementation prior to the initiation of chemoradiation. EXAM: 1. IMPLANTED PORT A CATH PLACEMENT WITH ULTRASOUND AND FLUOROSCOPIC GUIDANCE 2. FLUOROSCOPIC GUIDED PERCUTANEOUS GASTROSTOMY TUBE PLACEMENT COMPARISON:  CHEST CT - 03/02/2016 MEDICATIONS: Ancef 2 gm IV; The  antibiotic was administered within an appropriate time interval prior to skin puncture. ANESTHESIA/SEDATION: Moderate (conscious) sedation was employed during this procedure. A total of Versed 7 mg and Fentanyl 225 mcg was administered intravenously. Moderate Sedation Time: 43 minutes. The patient's level of consciousness and vital signs were monitored continuously by radiology nursing throughout the procedure under my direct supervision. CONTRAST:  20 cc Isovue 300, administered into the stomach lumen. FLUOROSCOPY TIME:  A total of 5 minutes, 12 seconds (279 mGy) fluoroscopy time was utilized for both procedures. COMPLICATIONS: None immediate. PROCEDURE: The procedures, risks, benefits, and alternatives were explained to the patient. Questions regarding the procedure were encouraged and answered. The patient understands and consents to both procedures. Attention was initially paid towards placement of the [facility] a Catheter. The right neck and chest were prepped with chlorhexidine in a sterile fashion, and a sterile drape was applied covering the operative field. Maximum barrier sterile technique with sterile gowns and gloves were used for the procedure. A timeout  was performed prior to the initiation of the procedure. Local anesthesia was provided with 1% lidocaine with epinephrine. After creating a small venotomy incision, a micropuncture kit was utilized to access the internal jugular vein under direct, real-time ultrasound guidance. Ultrasound image documentation was performed. The microwire was kinked to measure appropriate catheter length. A subcutaneous port pocket was then created along the upper chest wall utilizing a combination of sharp and blunt dissection. The pocket was irrigated with sterile saline. A single lumen ISP power injectable port was chosen for placement. The 8 Fr catheter was tunneled from the port pocket site to the venotomy incision. The port was placed in the pocket. The external catheter  was trimmed to appropriate length. At the venotomy, an 8 Fr peel-away sheath was placed over a guidewire under fluoroscopic guidance. The catheter was then placed through the sheath and the sheath was removed. Final catheter positioning was confirmed and documented with a fluoroscopic spot radiograph. The port was accessed with a Huber needle, aspirated and flushed with heparinized saline. The venotomy site was closed with an interrupted 4-0 Vicryl suture. The port pocket incision was closed with interrupted 2-0 Vicryl suture and the skin was opposed with a running subcuticular 4-0 Vicryl suture. Dermabond and Steri-strips were applied to both incisions. Dressings were placed. Next, attention was paid towards placement of the gastrostomy tube. The left upper quadrant was sterilely prepped and draped. An oral gastric catheter was inserted into the stomach under fluoroscopy. The existing nasogastric feeding tube was removed. The left costal margin and air / barium opacified transverse colon were identified and avoided. Air was injected into the stomach for insufflation and visualization under fluoroscopy. Under sterile conditions a 17 gauge trocar needle was utilized to access the stomach percutaneously beneath the left subcostal margin after the overlying soft tissues were anesthetized with 1% Lidocaine with epinephrine. Needle position was confirmed within the stomach with aspiration of air and injection of small amount of contrast. A single T tack was deployed for gastropexy. Over an Amplatz guide wire, a 9-French sheath was inserted into the stomach. A snare device was utilized to capture the oral gastric catheter. The snare device was pulled retrograde from the stomach up the esophagus and out the oropharynx. The 20-French pull-through gastrostomy was connected to the snare device and pulled antegrade through the oropharynx down the esophagus into the stomach and then through the percutaneous tract external to  the patient. The gastrostomy was assembled externally. Contrast injection confirms position in the stomach. Several spot radiographic images were obtained in various obliquities for documentation. Dressings were placed. The patient tolerated both above procedures well without immediate post procedural complication. FINDINGS: After catheter placement, the tip lies within the superior cavoatrial junction. The catheter aspirates and flushes normally and is ready for immediate use. After successful fluoroscopic guided placement, the gastrostomy tube is appropriately positioned with internal disc against the ventral aspect of the gastric lumen. IMPRESSION: 1. Successful placement of a right internal jugular approach power injectable Port-A-Cath. The Port a catheter is ready for immediate use. 2. Successful fluoroscopic insertion of a 20-French pull-through gastrostomy tube. The gastrostomy may be used immediately for medication administration and may be utilized in 24 hrs for the initiation of feeds. Electronically Signed   By: Sandi Mariscal M.D.   On: 03/15/2016 16:56   Dg Swallowing Func-speech Pathology  Result Date: 03/13/2016 Objective Swallowing Evaluation: Type of Study: MBS-Modified Barium Swallow Study Patient Details Name: ANNALINA NEEDLES MRN: [id number] Date of Birth:  07-Mar-1955 Today's Date: 03/13/2016 Time: SLP Start Time (ACUTE ONLY): 1343-SLP Stop Time (ACUTE ONLY): 1410 SLP Time Calculation (min) (ACUTE ONLY): 27 min Past Medical History: Past Medical History: Diagnosis Date . Anxiety  . Arthritis  . Depression   takes ZYban daily . History of colon polyps   benign . Hyperlipidemia   takes Atorvastatin daily . Hypertension   takes HCTZ daily . Hypothyroidism   takes Synthroid daily . Insomnia   takes Ambien nightly and Xanax as needed . Refusal of blood transfusions as patient is Jehovah's Witness  Past Surgical History: Past Surgical History: Procedure Laterality Date . CHOLECYSTECTOMY   . COLONOSCOPY   .  DILATION AND CURETTAGE OF UTERUS   . EXCISION OF TONGUE LESION Left 02/08/2016  Procedure: WIDE LOCAL EXCISION OF LEFT LATERAL TONGUE;  Surgeon: Jerrell Belfast, MD;  Location: [place];  Service: ENT;  Laterality: Left; . FEMUR IM NAIL Right 02/28/2012  Procedure: OPEN REDUCTION INTERNAL FIXATION (ORIF) RIGHT KNEE;  Surgeon: Alta Corning, MD;  Location: [place];  Service: Orthopedics;  Laterality: Right; . KNEE ARTHROPLASTY  12/14/2011  Procedure: COMPUTER ASSISTED TOTAL KNEE ARTHROPLASTY;  Surgeon: Alta Corning, MD;  Location: [place];  Service: Orthopedics;  Laterality: Right; . KNEE ARTHROSCOPY    Right . PLANTAR FASCIA SURGERY    right foot . RADICAL NECK DISSECTION Left 02/08/2016  Procedure: LEFT SELECTIVE RADICAL NECK DISSECTION;  Surgeon: Jerrell Belfast, MD;  Location: [facility] OR;  Service: ENT;  Laterality: Left; . TENDON RELEASE    right wrist . 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 Rad tx begins 03-15-16, chemo begins 03-16-16. Pt with PEG placement Thursday 03-15-16.  Pt reports improved swallow/speech since recovery from surgery.   Subjective: pt ambulated easily into xray department, friendly Assessment / Plan / Recommendation CHL IP CLINICAL IMPRESSIONS 03/13/2016 Clinical Impression Pt presents with mild oral dysphagia due to structural changes and ? 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  Oral difficultlies characterized by oral discoordination/transiting difficulties.  She compensates well by piecemealing and placing boluses on right side of oral cavity.  Pharyngeal swallow was strong with only occasional minimal residuals of thin liquids.  No aspiration or penetration noted.  Barium tablet taken with pudding after inability to orally transit with thin transited pharynx easily.  Tablet appeared to lodge in mid=esophagus with initial referrant  sensastion to pharynx - that abated with pt reflexively swallowing.  Pt appeared with backflow of liquids at mid=esophagus when taken to clear tablet - without sensation.  Advised pt to assure adequate liquid intake with meds with puree.  Encouraged pt to continue po as tolerated during XRT and provided written handout for xerostomia, possible radiation side effectis.  Provided pt with syringe to use for liquid adminiistration/oral care as XRT progressed.  Thanks for this referral.  SLP Visit Diagnosis Dysphagia, oral phase (R13.11) Attention and concentration deficit following -- Frontal lobe and executive function deficit following -- Impact on safety and function Mild aspiration risk     CHL IP DIET RECOMMENDATION 03/13/2016 SLP Diet Recommendations Dysphagia 3 (Mech soft) solids;Thin liquid Liquid Administration via Cup;Straw Medication Administration Whole meds with puree Compensations Slow rate;Small sips/bites;Lingual sweep for clearance of pocketing Postural Changes Remain semi-upright after after feeds/meals (Comment);Seated upright at 90 degrees   CHL IP OTHER RECOMMENDATIONS 03/13/2016 Recommended Consults (No Data) Oral  Care Recommendations Oral care BID Other Recommendations --   CHL IP FOLLOW UP RECOMMENDATIONS 03/13/2016 Follow up Recommendations (No Data)   No flowsheet data found.     CHL IP ORAL PHASE 03/13/2016 Oral Phase Impaired Oral - Pudding Teaspoon -- Oral - Pudding Cup -- Oral - Honey Teaspoon -- Oral - Honey Cup -- Oral - Nectar Teaspoon -- Oral - Nectar Cup Weak lingual manipulation;Reduced posterior propulsion;Piecemeal swallowing;Premature spillage Oral - Nectar Straw -- Oral - Thin Teaspoon -- Oral - Thin Cup Weak lingual manipulation;Reduced posterior propulsion;Piecemeal swallowing;Delayed oral transit;Decreased bolus cohesion;Premature spillage Oral - Thin Straw Weak lingual manipulation;Reduced posterior propulsion;Piecemeal swallowing;Decreased bolus cohesion;Premature spillage Oral -  Puree Weak lingual manipulation;Lingual pumping;Reduced posterior propulsion;Piecemeal swallowing;Delayed oral transit;Premature spillage Oral - Mech Soft Impaired mastication;Weak lingual manipulation;Reduced posterior propulsion;Piecemeal swallowing;Delayed oral transit Oral - Regular -- Oral - Multi-Consistency -- Oral - Pill Weak lingual manipulation;Reduced posterior propulsion;Lingual/palatal residue;Piecemeal swallowing;Decreased bolus cohesion Oral Phase - Comment pt placed bolus in right oral cavity to aid transiting, barium tablet placed on right mid=[place] region but needed puree to transit  CHL IP PHARYNGEAL PHASE 03/13/2016 Pharyngeal Phase WFL Pharyngeal- Pudding Teaspoon -- Pharyngeal -- Pharyngeal- Pudding Cup -- Pharyngeal -- Pharyngeal- Honey Teaspoon -- Pharyngeal -- Pharyngeal- Honey Cup -- Pharyngeal -- Pharyngeal- Nectar Teaspoon -- Pharyngeal -- Pharyngeal- Nectar Cup WFL Pharyngeal -- Pharyngeal- Nectar Straw -- Pharyngeal -- Pharyngeal- Thin Teaspoon -- Pharyngeal -- Pharyngeal- Thin Cup WFL Pharyngeal -- Pharyngeal- Thin Straw WFL Pharyngeal -- Pharyngeal- Puree WFL Pharyngeal -- Pharyngeal- Mechanical Soft WFL Pharyngeal -- Pharyngeal- Regular -- Pharyngeal -- Pharyngeal- Multi-consistency -- Pharyngeal -- Pharyngeal- Pill WFL Pharyngeal -- Pharyngeal Comment --  CHL IP CERVICAL ESOPHAGEAL PHASE 03/13/2016 Cervical Esophageal Phase WFL Pudding Teaspoon -- Pudding Cup -- Honey Teaspoon -- Honey Cup -- Nectar Teaspoon -- Nectar Cup -- Nectar Straw -- Thin Teaspoon -- Thin Cup -- Thin Straw -- Puree -- Mechanical Soft -- Regular -- Multi-consistency -- Pill -- Cervical Esophageal Comment -- CHL IP GO 03/13/2016 Functional Assessment Tool Used MBS, clinical judgement Functional Limitations Swallowing Swallow Current Status (H8299) CI Swallow Goal Status (B7169) CI Swallow Discharge Status (C7893) CI Luanna Salk, MS [facility] SLP [phone] Macario Golds 03/13/2016, 7:20 PM              Ir  Cyndy Freeze Guide Port Insertion Right  Result Date: 03/15/2016 INDICATION: History of tongue cancer. In need of durable intravenous access for chemotherapy administration. Please perform percutaneous gastrostomy tube for enteric nutrition supplementation prior to the initiation of chemoradiation. EXAM: 1. IMPLANTED PORT A CATH PLACEMENT WITH ULTRASOUND AND FLUOROSCOPIC GUIDANCE 2. FLUOROSCOPIC GUIDED PERCUTANEOUS GASTROSTOMY TUBE PLACEMENT COMPARISON:  CHEST CT - 03/02/2016 MEDICATIONS: Ancef 2 gm IV; The antibiotic was administered within an appropriate time interval prior to skin puncture. ANESTHESIA/SEDATION: Moderate (conscious) sedation was employed during this procedure. A total of Versed 7 mg and Fentanyl 225 mcg was administered intravenously. Moderate Sedation Time: 43 minutes. The patient's level of consciousness and vital signs were monitored continuously by radiology nursing throughout the procedure under my direct supervision. CONTRAST:  20 cc Isovue 300, administered into the stomach lumen. FLUOROSCOPY TIME:  A total of 5 minutes, 12 seconds (279 mGy) fluoroscopy time was utilized for both procedures. COMPLICATIONS: None immediate. PROCEDURE: The procedures, risks, benefits, and alternatives were explained to the patient. Questions regarding the procedure were encouraged and answered. The patient understands and consents to both procedures. Attention was initially paid towards placement of the [facility] a Catheter. The right neck  and chest were prepped with chlorhexidine in a sterile fashion, and a sterile drape was applied covering the operative field. Maximum barrier sterile technique with sterile gowns and gloves were used for the procedure. A timeout was performed prior to the initiation of the procedure. Local anesthesia was provided with 1% lidocaine with epinephrine. After creating a small venotomy incision, a micropuncture kit was utilized to access the internal jugular vein under direct, real-time  ultrasound guidance. Ultrasound image documentation was performed. The microwire was kinked to measure appropriate catheter length. A subcutaneous port pocket was then created along the upper chest wall utilizing a combination of sharp and blunt dissection. The pocket was irrigated with sterile saline. A single lumen ISP power injectable port was chosen for placement. The 8 Fr catheter was tunneled from the port pocket site to the venotomy incision. The port was placed in the pocket. The external catheter was trimmed to appropriate length. At the venotomy, an 8 Fr peel-away sheath was placed over a guidewire under fluoroscopic guidance. The catheter was then placed through the sheath and the sheath was removed. Final catheter positioning was confirmed and documented with a fluoroscopic spot radiograph. The port was accessed with a Huber needle, aspirated and flushed with heparinized saline. The venotomy site was closed with an interrupted 4-0 Vicryl suture. The port pocket incision was closed with interrupted 2-0 Vicryl suture and the skin was opposed with a running subcuticular 4-0 Vicryl suture. Dermabond and Steri-strips were applied to both incisions. Dressings were placed. Next, attention was paid towards placement of the gastrostomy tube. The left upper quadrant was sterilely prepped and draped. An oral gastric catheter was inserted into the stomach under fluoroscopy. The existing nasogastric feeding tube was removed. The left costal margin and air / barium opacified transverse colon were identified and avoided. Air was injected into the stomach for insufflation and visualization under fluoroscopy. Under sterile conditions a 17 gauge trocar needle was utilized to access the stomach percutaneously beneath the left subcostal margin after the overlying soft tissues were anesthetized with 1% Lidocaine with epinephrine. Needle position was confirmed within the stomach with aspiration of air and injection of small  amount of contrast. A single T tack was deployed for gastropexy. Over an Amplatz guide wire, a 9-French sheath was inserted into the stomach. A snare device was utilized to capture the oral gastric catheter. The snare device was pulled retrograde from the stomach up the esophagus and out the oropharynx. The 20-French pull-through gastrostomy was connected to the snare device and pulled antegrade through the oropharynx down the esophagus into the stomach and then through the percutaneous tract external to the patient. The gastrostomy was assembled externally. Contrast injection confirms position in the stomach. Several spot radiographic images were obtained in various obliquities for documentation. Dressings were placed. The patient tolerated both above procedures well without immediate post procedural complication. FINDINGS: After catheter placement, the tip lies within the superior cavoatrial junction. The catheter aspirates and flushes normally and is ready for immediate use. After successful fluoroscopic guided placement, the gastrostomy tube is appropriately positioned with internal disc against the ventral aspect of the gastric lumen. IMPRESSION: 1. Successful placement of a right internal jugular approach power injectable Port-A-Cath. The Port a catheter is ready for immediate use. 2. Successful fluoroscopic insertion of a 20-French pull-through gastrostomy tube. The gastrostomy may be used immediately for medication administration and may be utilized in 24 hrs for the initiation of feeds. Electronically Signed   By: Eldridge Abrahams.D.  On: 03/15/2016 16:56    ASSESSMENT & PLAN:  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[place]) The patient did not tolerate treatment well She denies significant pain. She denies significant nausea or vomiting  I am very concerned about her and abnormal renal function  I recommend 50% dose adjustment for all future chemotherapy I continue to encourage her to increase oral  intake as tolerated  Acute renal failure ([place]) This is secondary to poor oral intake, dehydration and recent chemotherapy. I will reduce dose of all chemotherapy by 50% and reassess next week. She is encouraged to increase oral fluid intake  Dysarthria She has mild dysarthria related to surgery.  Weight loss She has recent weight loss due to poor oral intake. We discussed different diet especially high-calorie supplementation. She was see dietitian weekly. I recommend consideration to start using the feeding tube if she cannot take much by mouth   No orders of the defined types were placed in this encounter.  All questions were answered. The patient knows to call the clinic with any problems, questions or concerns. No barriers to learning was detected. I spent 25 minutes counseling the patient face to face. The total time spent in the appointment was 30 minutes and more than 50% was on counseling and review of test results     [name], MD 03/29/2016 2:07 PM

## 2016-03-29 NOTE — Assessment & Plan Note (Signed)
She has mild dysarthria related to surgery.

## 2016-03-29 NOTE — Assessment & Plan Note (Signed)
The patient did not tolerate treatment well She denies significant pain. She denies significant nausea or vomiting  I am very concerned about her and abnormal renal function  I recommend 50% dose adjustment for all future chemotherapy I continue to encourage her to increase oral intake as tolerated

## 2016-03-29 NOTE — Assessment & Plan Note (Signed)
She has recent weight loss due to poor oral intake. We discussed different diet especially high-calorie supplementation. She was see dietitian weekly. I recommend consideration to start using the feeding tube if she cannot take much by mouth

## 2016-03-29 NOTE — Assessment & Plan Note (Addendum)
This is secondary to poor oral intake, dehydration and recent chemotherapy. I will reduce dose of all chemotherapy by 50% and reassess next week. She is encouraged to increase oral fluid intake

## 2016-03-29 NOTE — Progress Notes (Signed)
Oncology Nurse Navigator Documentation  Met with Ms. Bellisario during Established Patient appt with Dr. Alvy Bimler.  She was accompanied by her niece. She reported:  Eating soft foods, drinking liquids.  She reports today with 4 lb weight loss.  Not using PEG for nutritional supplement per 2/23 guidance from [facility], Nutrition.  This navigator encouraged her to implement guidance pending next nutrition appt.  She voiced understanding.  Mild sore throat.  Using Viscous Lidocaine prior to meals and on occasion during day, morphine 2-3 times daily.    Daily BMs, taking 2 tab Senakot BID.  Drinking 4 8 oz bottles water daily.  Advised by Dr. Alvy Bimler to increase as able to improve kidney function. I answered her questions re care of skin in treatment area. She understands to contact me with needs/concerns.  [name], RN, BSN, [place] Oncology Nurse [place] at [place] [phone]

## 2016-03-30 ENCOUNTER — Ambulatory Visit: Payer: 59 | Admitting: Nutrition

## 2016-03-30 ENCOUNTER — Ambulatory Visit (HOSPITAL_BASED_OUTPATIENT_CLINIC_OR_DEPARTMENT_OTHER): Payer: 59

## 2016-03-30 ENCOUNTER — Ambulatory Visit
Admission: RE | Admit: 2016-03-30 | Discharge: 2016-03-30 | Disposition: A | Discharge status: Home / Self Care | Payer: 59 | Source: Ambulatory Visit | Attending: Radiation Oncology | Admitting: Radiation Oncology

## 2016-03-30 VITALS — BP 121/65 | HR 93 | Temp 98.7°F | Resp 18

## 2016-03-30 DIAGNOSIS — C023 Malignant neoplasm of anterior two-thirds of tongue, part unspecified: Secondary | ICD-10-CM

## 2016-03-30 DIAGNOSIS — Z5111 Encounter for antineoplastic chemotherapy: Secondary | ICD-10-CM

## 2016-03-30 DIAGNOSIS — Z510 Encounter for antineoplastic radiation therapy: Secondary | ICD-10-CM | POA: Diagnosis not present

## 2016-03-30 MED ORDER — CISPLATIN CHEMO INJECTION 100MG/100ML
20.0000 mg/m2 | Freq: Once | INTRAVENOUS | Status: AC
  Administered 2016-03-30: 44 mg via INTRAVENOUS
  Filled 2016-03-30: qty 44

## 2016-03-30 MED ORDER — PALONOSETRON HCL INJECTION 0.25 MG/5ML
0.2500 mg | Freq: Once | INTRAVENOUS | Status: AC
  Administered 2016-03-30: 0.25 mg via INTRAVENOUS

## 2016-03-30 MED ORDER — SODIUM CHLORIDE 0.9 % IV SOLN: Freq: Once | INTRAVENOUS | Status: DC

## 2016-03-30 MED ORDER — POTASSIUM CHLORIDE 2 MEQ/ML IV SOLN
Freq: Once | INTRAVENOUS | Status: AC
  Administered 2016-03-30: 11:00:00 via INTRAVENOUS
  Filled 2016-03-30: qty 10

## 2016-03-30 MED ORDER — HEPARIN SOD (PORK) LOCK FLUSH 100 UNIT/ML IV SOLN
500.0000 [IU] | Freq: Once | INTRAVENOUS | Status: AC | PRN
  Administered 2016-03-30: 500 [IU]
  Filled 2016-03-30: qty 5

## 2016-03-30 MED ORDER — PALONOSETRON HCL INJECTION 0.25 MG/5ML
INTRAVENOUS | Status: AC
  Filled 2016-03-30: qty 5

## 2016-03-30 MED ORDER — SODIUM CHLORIDE 0.9% FLUSH
10.0000 mL | INTRAVENOUS | Status: DC | PRN
  Administered 2016-03-30: 10 mL
  Filled 2016-03-30: qty 10

## 2016-03-30 MED ORDER — SODIUM CHLORIDE 0.9 % IV SOLN
Freq: Once | INTRAVENOUS | Status: AC
  Administered 2016-03-30: 13:00:00 via INTRAVENOUS
  Filled 2016-03-30: qty 5

## 2016-03-30 MED ORDER — ONDANSETRON HCL 4 MG/2ML IJ SOLN
INTRAMUSCULAR | Status: AC
  Filled 2016-03-30: qty 4

## 2016-03-30 MED ORDER — ONDANSETRON HCL 4 MG/2ML IJ SOLN
8.0000 mg | Freq: Once | INTRAMUSCULAR | Status: AC
  Administered 2016-03-30: 8 mg via INTRAVENOUS

## 2016-03-30 MED ORDER — SODIUM CHLORIDE 0.9 % IV SOLN
Freq: Once | INTRAVENOUS | Status: AC
  Administered 2016-03-30: 11:00:00 via INTRAVENOUS

## 2016-03-30 MED ORDER — OSMOLITE 1.5 CAL PO LIQD: ORAL | 0 refills | Status: DC

## 2016-03-30 NOTE — Progress Notes (Signed)
Nutrition follow-up completed with patient during infusion for tongue cancer. Patient reports oral intake is decreasing. Weight has also declined was documented as 212 pounds down from 218 pounds March 26. Patient reports she use one can Osmolite 1.5 yesterday and again this morning. She did have one episode of vomiting this morning but denies other vomiting. Denies any trouble with tube feeding yesterday. Reports she is ready to begin using her feeding tube consistently.  Nutrition diagnosis:  Unintended weight loss continues. Severe malnutrition continues.  Intervention: Educated patient to begin Osmolite 1.5, one can 4 times a day, with 60 cc free water before and after. Patient will advance to 1-1/2 cans 4 times a day on Sunday, April 1 as tolerated. Patient will increase to goal rate of 1-1/2 cans twice a day and 2 cans twice a day on Tuesday, April 3 to provide 2485 cal, 104 g protein, and 1747 mL free water.  Patient was educated to consume an additional 240 cc of water 3 times a day by mouth or via tube. Patient was educated, given written instructions. [facility] was notified. Questions were answered.  Teach back method used.  Monitoring, evaluation, goals: Patient will tolerate tube feeding to meet greater than 90% of estimated calorie needs.  Next visit: Friday, April 6 during infusion.  **Disclaimer: This note was dictated with voice recognition software. Similar sounding words can inadvertently be transcribed and this note may contain transcription errors which may not have been corrected upon publication of note.**

## 2016-03-30 NOTE — Patient Instructions (Signed)
[facility] Discharge Instructions for Patients Receiving Chemotherapy  Today you received the following chemotherapy agents: Cisplatin   To help prevent nausea and vomiting after your treatment, we encourage you to take your nausea medication as directed.    If you develop nausea and vomiting that is not controlled by your nausea medication, call the clinic.   BELOW ARE SYMPTOMS THAT SHOULD BE REPORTED IMMEDIATELY:  *FEVER GREATER THAN 100.5 F  *CHILLS WITH OR WITHOUT FEVER  NAUSEA AND VOMITING THAT IS NOT CONTROLLED WITH YOUR NAUSEA MEDICATION  *UNUSUAL SHORTNESS OF BREATH  *UNUSUAL BRUISING OR BLEEDING  TENDERNESS IN MOUTH AND THROAT WITH OR WITHOUT PRESENCE OF ULCERS  *URINARY PROBLEMS  *BOWEL PROBLEMS  UNUSUAL RASH Items with * indicate a potential emergency and should be followed up as soon as possible.  Feel free to call the clinic you have any questions or concerns. The clinic phone number is [phone].  Please show the CHEMO ALERT CARD at check-in to the Emergency Department and triage nurse.   

## 2016-03-30 NOTE — Progress Notes (Signed)
Per Dr. Calton Dach note, okay to treat today with Crt of 1.6  Per Dr. Alvy Bimler, patient does not need a repeat creatinine today and it is okay for patient to receive 8mg  IV zofran prior to treatment.

## 2016-03-31 MED FILL — LIDOCAINE 2% VISCOUS SOLN: 2 | 2 days supply | Qty: 100 | Fill #1

## 2016-04-02 ENCOUNTER — Ambulatory Visit
Admission: RE | Admit: 2016-04-02 | Discharge: 2016-04-02 | Disposition: A | Discharge status: Home / Self Care | Payer: 59 | Source: Ambulatory Visit | Attending: Radiation Oncology | Admitting: Radiation Oncology

## 2016-04-02 VITALS — BP 121/77 | HR 89 | Temp 98.6°F | Resp 18 | Wt 209.0 lb

## 2016-04-02 DIAGNOSIS — C023 Malignant neoplasm of anterior two-thirds of tongue, part unspecified: Secondary | ICD-10-CM

## 2016-04-02 DIAGNOSIS — Z510 Encounter for antineoplastic radiation therapy: Secondary | ICD-10-CM | POA: Diagnosis not present

## 2016-04-02 NOTE — Progress Notes (Signed)
   Weekly Management Note:  Outpatient    ICD-9-CM ICD-10-CM   1. 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HCC) 141.4 C02.3     Current Dose:  26.793 Gy  Projected Dose: 66 Gy   Narrative:  The patient presents for routine under treatment assessment.  CBCT/MVCT images/Port film x-rays were reviewed.  The chart was checked. Patient denies pain. She reports fatigue and difficulty sleeping. She is using Biafine cream. She reports itching to the face, neck, and shoulders. She reports she is still able to eat/ drink by mouth some of the time and she uses the PEG tube the rest of the time. She uses Premier protein drinks. She reports dry mouth.  Physical Findings:  Wt Readings from Last 3 Encounters:  04/02/16 209 lb (94.8 kg)  03/29/16 212 lb 14.4 oz (96.6 kg)  03/26/16 218 lb 3.2 oz (99 kg)    weight is 209 lb (94.8 kg). Her oral temperature is 98.6 F (37 C). Her blood pressure is 121/77 and her pulse is 89. Her respiration is 18 and oxygen saturation is 99%.  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 Oral cavity is free of secondary infection.  CBC    Component Value Date/Time   WBC 7.5 03/28/2016 1028   WBC 9.5 03/15/2016 1230   RBC 4.23 03/28/2016 1028   RBC 5.01 03/15/2016 1230   HGB 12.4 03/28/2016 1028   HCT 35.3 03/28/2016 1028   PLT 202 03/28/2016 1028   MCV 83.4 03/28/2016 1028   MCH 29.3 03/28/2016 1028   MCH 28.7 03/15/2016 1230   MCHC 35.1 03/28/2016 1028   MCHC 36.3 (H) 03/15/2016 1230   RDW 15.2 (H) 03/28/2016 1028   LYMPHSABS 0.5 (L) 03/28/2016 1028   MONOABS 0.6 03/28/2016 1028   EOSABS 0.1 03/28/2016 1028   BASOSABS 0.1 03/28/2016 1028     CMP     Component Value Date/Time   NA 137 03/28/2016 1028   K 4.3 03/28/2016 1028   CL 99 (L) 02/07/2016 0836   CO2 29 03/28/2016 1028   GLUCOSE 95 03/28/2016 1028   BUN 19.1 03/28/2016 1028   CREATININE 1.6 (H) 03/28/2016 1028   CALCIUM 9.5 03/28/2016 1028   PROT 7.2  03/28/2016 1028   ALBUMIN 3.6 03/28/2016 1028   AST 27 03/28/2016 1028   ALT 18 03/28/2016 1028   ALKPHOS 109 03/28/2016 1028   BILITOT 0.85 03/28/2016 1028   GFRNONAA 45 (L) 02/08/2016 1859   GFRAA 52 (L) 02/08/2016 1859     Impression:  The patient is tolerating radiotherapy.   Plan:  Continue radiotherapy as planned.   -----------------------------------   This document serves as a record of services personally performed by Gery Pray, MD. It was created on his behalf by Bethann Humble, a trained medical scribe. The creation of this record is based on the scribe's personal observations and the provider's statements to them. This document has been checked and approved by the attending provider.

## 2016-04-02 NOTE — Progress Notes (Signed)
Tiffany Yu completed 13th fraction of radiation to tongue/bilateral neck.  Patient denies any pain.  Patient reports having a good deal of fatigue and difficulty sleeping.  Patient reports using biafine cream.  She reports having a lot of itching to face, neck, shoulders and wanted to know what she could use for this.  Wasn't sure if it was radiation or the biafine cream.  Patient reports still able to eat/drink by mouth  Some but is using the tube more now.  States she uses Premier protein drinks.  Does not have any taste and reports dry mouth.  Denies having any cough.    Vitals:   04/02/16 1006  BP: 121/77  Pulse: 89  Resp: 18  Temp: 98.6 F (37 C)  TempSrc: Oral  SpO2: 99%  Weight: 209 lb (94.8 kg)    Wt Readings from Last 3 Encounters:  04/02/16 209 lb (94.8 kg)  03/29/16 212 lb 14.4 oz (96.6 kg)  03/26/16 218 lb 3.2 oz (99 kg)

## 2016-04-03 ENCOUNTER — Ambulatory Visit
Admission: RE | Admit: 2016-04-03 | Discharge: 2016-04-03 | Disposition: A | Discharge status: Home / Self Care | Payer: 59 | Source: Ambulatory Visit | Attending: Radiation Oncology | Admitting: Radiation Oncology

## 2016-04-03 DIAGNOSIS — Z510 Encounter for antineoplastic radiation therapy: Secondary | ICD-10-CM | POA: Diagnosis not present

## 2016-04-04 ENCOUNTER — Ambulatory Visit
Admission: RE | Admit: 2016-04-04 | Discharge: 2016-04-04 | Disposition: A | Discharge status: Home / Self Care | Payer: 59 | Source: Ambulatory Visit | Attending: Radiation Oncology | Admitting: Radiation Oncology

## 2016-04-04 ENCOUNTER — Telehealth: Payer: Self-pay | Admitting: *Deleted

## 2016-04-04 ENCOUNTER — Other Ambulatory Visit: Payer: Self-pay | Admitting: Hematology and Oncology

## 2016-04-04 ENCOUNTER — Ambulatory Visit (HOSPITAL_BASED_OUTPATIENT_CLINIC_OR_DEPARTMENT_OTHER): Payer: 59

## 2016-04-04 ENCOUNTER — Telehealth: Payer: Self-pay | Admitting: Hematology and Oncology

## 2016-04-04 ENCOUNTER — Other Ambulatory Visit (HOSPITAL_BASED_OUTPATIENT_CLINIC_OR_DEPARTMENT_OTHER): Payer: 59

## 2016-04-04 VITALS — BP 121/72 | HR 88 | Temp 98.5°F | Resp 20

## 2016-04-04 DIAGNOSIS — Z510 Encounter for antineoplastic radiation therapy: Secondary | ICD-10-CM | POA: Diagnosis not present

## 2016-04-04 DIAGNOSIS — N179 Acute kidney failure, unspecified: Secondary | ICD-10-CM

## 2016-04-04 DIAGNOSIS — C023 Malignant neoplasm of anterior two-thirds of tongue, part unspecified: Secondary | ICD-10-CM | POA: Diagnosis not present

## 2016-04-04 LAB — CBC WITH DIFFERENTIAL/PLATELET
BASO%: 0.4 % (ref 0.0–2.0)
Basophils Absolute: 0 10*3/uL (ref 0.0–0.1)
EOS%: 3.9 % (ref 0.0–7.0)
Eosinophils Absolute: 0.2 10*3/uL (ref 0.0–0.5)
HEMATOCRIT: 33 % — AB (ref 34.8–46.6)
HGB: 11.7 g/dL (ref 11.6–15.9)
LYMPH#: 0.3 10*3/uL — AB (ref 0.9–3.3)
LYMPH%: 6.5 % — ABNORMAL LOW (ref 14.0–49.7)
MCH: 29 pg (ref 25.1–34.0)
MCHC: 35.4 g/dL (ref 31.5–36.0)
MCV: 82 fL (ref 79.5–101.0)
MONO#: 0.5 10*3/uL (ref 0.1–0.9)
MONO%: 9.6 % (ref 0.0–14.0)
NEUT%: 79.6 % — ABNORMAL HIGH (ref 38.4–76.8)
NEUTROS ABS: 3.8 10*3/uL (ref 1.5–6.5)
PLATELETS: 187 10*3/uL (ref 145–400)
RBC: 4.02 10*6/uL (ref 3.70–5.45)
RDW: 15.3 % — ABNORMAL HIGH (ref 11.2–14.5)
WBC: 4.8 10*3/uL (ref 3.9–10.3)

## 2016-04-04 LAB — COMPREHENSIVE METABOLIC PANEL
ALT: 17 U/L (ref 0–55)
ANION GAP: 7 meq/L (ref 3–11)
AST: 22 U/L (ref 5–34)
Albumin: 3.7 g/dL (ref 3.5–5.0)
Alkaline Phosphatase: 119 U/L (ref 40–150)
BUN: 19.4 mg/dL (ref 7.0–26.0)
CHLORIDE: 99 meq/L (ref 98–109)
CO2: 28 mEq/L (ref 22–29)
Calcium: 9.5 mg/dL (ref 8.4–10.4)
Creatinine: 1.8 mg/dL — ABNORMAL HIGH (ref 0.6–1.1)
EGFR: 34 mL/min/{1.73_m2} — AB (ref >90)
Glucose: 108 mg/dl (ref 70–140)
POTASSIUM: 4.7 meq/L (ref 3.5–5.1)
Sodium: 134 mEq/L — ABNORMAL LOW (ref 136–145)
Total Bilirubin: 0.57 mg/dL (ref 0.20–1.20)
Total Protein: 7.3 g/dL (ref 6.4–8.3)

## 2016-04-04 LAB — MAGNESIUM: MAGNESIUM: 1.7 mg/dL (ref 1.5–2.5)

## 2016-04-04 MED ORDER — ONDANSETRON HCL 4 MG/2ML IJ SOLN
INTRAMUSCULAR | Status: AC
  Filled 2016-04-04: qty 4

## 2016-04-04 MED ORDER — ONDANSETRON HCL 4 MG/2ML IJ SOLN
8.0000 mg | Freq: Once | INTRAMUSCULAR | Status: AC
  Administered 2016-04-04: 8 mg via INTRAVENOUS

## 2016-04-04 MED ORDER — SODIUM CHLORIDE 0.9% FLUSH
10.0000 mL | INTRAVENOUS | Status: DC | PRN
  Administered 2016-04-04: 10 mL
  Filled 2016-04-04: qty 10

## 2016-04-04 MED ORDER — SODIUM CHLORIDE 0.9 % IV SOLN: Freq: Once | INTRAVENOUS | Status: DC

## 2016-04-04 MED ORDER — HEPARIN SOD (PORK) LOCK FLUSH 100 UNIT/ML IV SOLN
500.0000 [IU] | Freq: Once | INTRAVENOUS | Status: AC | PRN
  Administered 2016-04-04: 500 [IU]
  Filled 2016-04-04: qty 5

## 2016-04-04 MED ORDER — SODIUM CHLORIDE 0.9 % IV SOLN
Freq: Once | INTRAVENOUS | Status: AC
  Administered 2016-04-04: 15:00:00 via INTRAVENOUS

## 2016-04-04 MED FILL — LIDOCAINE-PRILOCAINE CREAM: 2.5-2.5 | 10 days supply | Qty: 30 | Fill #1

## 2016-04-04 NOTE — Telephone Encounter (Signed)
Notified of results. Instructed she is to have IVF beginning today - Saturday. To see Dr Alvy Bimler on Thursday, no chemo on Friday. Will have IVF for 3 weeks.  Coming today at 2:15pm

## 2016-04-04 NOTE — Telephone Encounter (Signed)
Appointments complete per 4/4 schedule message. Patient given new schedule in infusion area.

## 2016-04-04 NOTE — Patient Instructions (Signed)
Dehydration, Adult Dehydration is a condition in which there is not enough fluid or water in the body. This happens when you lose more fluids than you take in. Important organs, such as the kidneys, brain, and heart, cannot function without a proper amount of fluids. Any loss of fluids from the body can lead to dehydration. Dehydration can range from mild to severe. This condition should be treated right away to prevent it from becoming severe. What are the causes? This condition may be caused by:  Vomiting.  Diarrhea.  Excessive sweating, such as from heat exposure or exercise.  Not drinking enough fluid, especially:  When ill.  While doing activity that requires a lot of energy.  Excessive urination.  Fever.  Infection.  Certain medicines, such as medicines that cause the body to lose excess fluid (diuretics).  Inability to access safe drinking water.  Reduced physical ability to get adequate water and food. What increases the risk? This condition is more likely to develop in people:  Who have a poorly controlled long-term (chronic) illness, such as diabetes, heart disease, or kidney disease.  Who are age 65 or older.  Who are disabled.  Who live in a place with high altitude.  Who play endurance sports. What are the signs or symptoms? Symptoms of mild dehydration may include:   Thirst.  Dry lips.  Slightly dry mouth.  Dry, warm skin.  Dizziness. Symptoms of moderate dehydration may include:   Very dry mouth.  Muscle cramps.  Dark urine. Urine may be the color of tea.  Decreased urine production.  Decreased tear production.  Heartbeat that is irregular or faster than normal (palpitations).  Headache.  Light-headedness, especially when you stand up from a sitting position.  Fainting (syncope). Symptoms of severe dehydration may include:   Changes in skin, such as:  Cold and clammy skin.  Blotchy (mottled) or pale skin.  Skin that does  not quickly return to normal after being lightly pinched and released (poor skin turgor).  Changes in body fluids, such as:  Extreme thirst.  No tear production.  Inability to sweat when body temperature is high, such as in hot weather.  Very little urine production.  Changes in vital signs, such as:  Weak pulse.  Pulse that is more than 100 beats a minute when sitting still.  Rapid breathing.  Low blood pressure.  Other changes, such as:  Sunken eyes.  Cold hands and feet.  Confusion.  Lack of energy (lethargy).  Difficulty waking up from sleep.  Short-term weight loss.  Unconsciousness. How is this diagnosed? This condition is diagnosed based on your symptoms and a physical exam. Blood and urine tests may be done to help confirm the diagnosis. How is this treated? Treatment for this condition depends on the severity. Mild or moderate dehydration can often be treated at home. Treatment should be started right away. Do not wait until dehydration becomes severe. Severe dehydration is an emergency and it needs to be treated in a hospital. Treatment for mild dehydration may include:   Drinking more fluids.  Replacing salts and minerals in your blood (electrolytes) that you may have lost. Treatment for moderate dehydration may include:   Drinking an oral rehydration solution (ORS). This is a drink that helps you replace fluids and electrolytes (rehydrate). It can be found at pharmacies and retail stores. Treatment for severe dehydration may include:   Receiving fluids through an IV tube.  Receiving an electrolyte solution through a feeding tube that is   passed through your nose and into your stomach (nasogastric tube, or NG tube).  Correcting any abnormalities in electrolytes.  Treating the underlying cause of dehydration. Follow these instructions at home:  If directed by your health care provider, drink an ORS:  Make an ORS by following instructions on the  package.  Start by drinking small amounts, about  cup (120 mL) every 5-10 minutes.  Slowly increase how much you drink until you have taken the amount recommended by your health care provider.  Drink enough clear fluid to keep your urine clear or pale yellow. If you were told to drink an ORS, finish the ORS first, then start slowly drinking other clear fluids. Drink fluids such as:  Water. Do not drink only water. Doing that can lead to having too little salt (sodium) in the body (hyponatremia).  Ice chips.  Fruit juice that you have added water to (diluted fruit juice).  Low-calorie sports drinks.  Avoid:  Alcohol.  Drinks that contain a lot of sugar. These include high-calorie sports drinks, fruit juice that is not diluted, and soda.  Caffeine.  Foods that are greasy or contain a lot of fat or sugar.  Take over-the-counter and prescription medicines only as told by your health care provider.  Do not take sodium tablets. This can lead to having too much sodium in the body (hypernatremia).  Eat foods that contain a healthy balance of electrolytes, such as bananas, oranges, potatoes, tomatoes, and spinach.  Keep all follow-up visits as told by your health care provider. This is important. Contact a health care provider if:  You have abdominal pain that:  Gets worse.  Stays in one area (localizes).  You have a rash.  You have a stiff neck.  You are more irritable than usual.  You are sleepier or more difficult to wake up than usual.  You feel weak or dizzy.  You feel very thirsty.  You have urinated only a small amount of very dark urine over 6-8 hours. Get help right away if:  You have symptoms of severe dehydration.  You cannot drink fluids without vomiting.  Your symptoms get worse with treatment.  You have a fever.  You have a severe headache.  You have vomiting or diarrhea that:  Gets worse.  Does not go away.  You have blood or green matter  (bile) in your vomit.  You have blood in your stool. This may cause stool to look black and tarry.  You have not urinated in 6-8 hours.  You faint.  Your heart rate while sitting still is over 100 beats a minute.  You have trouble breathing. This information is not intended to replace advice given to you by your health care provider. Make sure you discuss any questions you have with your health care provider. Document Released: 12/18/2004 Document Revised: 07/15/2015 Document Reviewed: 02/11/2015 Elsevier Interactive Patient Education  2017 [employer]  

## 2016-04-04 NOTE — Telephone Encounter (Signed)
-----   Message from [name], MD sent at 04/04/2016 10:52 AM EDT ----- Regarding: IVF Hi Zamyiah Tino,  Her labs are worse. I need to start her on IVF daily through Saturday and daily next 3 weeks I am not sure if she can come back today She will not get chemo this week Please let me know if she can come back today for IVF and I will place orders  ----- Message ----- From: Interface, Lab In Three Zero One Sent: 04/04/2016  10:05 AM To: [name], MD

## 2016-04-05 ENCOUNTER — Ambulatory Visit (HOSPITAL_BASED_OUTPATIENT_CLINIC_OR_DEPARTMENT_OTHER): Payer: 59 | Admitting: Hematology and Oncology

## 2016-04-05 ENCOUNTER — Ambulatory Visit (HOSPITAL_BASED_OUTPATIENT_CLINIC_OR_DEPARTMENT_OTHER): Payer: 59 | Admitting: Nurse Practitioner

## 2016-04-05 ENCOUNTER — Encounter: Payer: Self-pay | Admitting: *Deleted

## 2016-04-05 ENCOUNTER — Encounter: Payer: Self-pay | Admitting: Hematology and Oncology

## 2016-04-05 ENCOUNTER — Ambulatory Visit
Admission: RE | Admit: 2016-04-05 | Discharge: 2016-04-05 | Disposition: A | Discharge status: Home / Self Care | Payer: 59 | Source: Ambulatory Visit | Attending: Radiation Oncology | Admitting: Radiation Oncology

## 2016-04-05 ENCOUNTER — Telehealth: Payer: Self-pay | Admitting: Hematology and Oncology

## 2016-04-05 VITALS — BP 122/75 | HR 85 | Temp 98.3°F | Resp 18

## 2016-04-05 DIAGNOSIS — C023 Malignant neoplasm of anterior two-thirds of tongue, part unspecified: Secondary | ICD-10-CM | POA: Diagnosis not present

## 2016-04-05 DIAGNOSIS — N179 Acute kidney failure, unspecified: Secondary | ICD-10-CM

## 2016-04-05 DIAGNOSIS — Z510 Encounter for antineoplastic radiation therapy: Secondary | ICD-10-CM | POA: Diagnosis not present

## 2016-04-05 MED ORDER — SODIUM CHLORIDE 0.9 % IV SOLN
Freq: Once | INTRAVENOUS | Status: AC
  Administered 2016-04-05: 13:00:00 via INTRAVENOUS

## 2016-04-05 MED ORDER — SODIUM CHLORIDE 0.9% FLUSH
10.0000 mL | INTRAVENOUS | Status: DC | PRN
  Administered 2016-04-05: 10 mL
  Filled 2016-04-05: qty 10

## 2016-04-05 MED ORDER — HEPARIN SOD (PORK) LOCK FLUSH 100 UNIT/ML IV SOLN
500.0000 [IU] | Freq: Once | INTRAVENOUS | Status: AC | PRN
  Administered 2016-04-05: 500 [IU]
  Filled 2016-04-05: qty 5

## 2016-04-05 NOTE — Progress Notes (Signed)
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 Tongue, biopsy, Anterior deep margin left SQUAMOUS CELL CARCINOMA 3. Tongue, biopsy, Posterior deep margin left NEGATIVE FOR CARCINOMA 4. Tongue, biopsy, Posterior margin left SQUAMOUS CELL CARCINOMA 5. Tongue, excisional biopsy, New anterior deep margin NEGATIVE FOR CARCINOMA 6. Tongue, excisional biopsy, New posterior deep margin SMALL FOCUS OF SQUAMOUS CELL CARCINOMA (0.1 CM, ONLY PRESENT AT THE DEEPER PERMINENT SECTION) 7. Tongue, resection for tumor, Left lateral INFILTRATIVE KERATINIZED SQUAMOUS CELL CARCINOMA (3.2 CM) THE TUMOR INVADES UNDERNEATH MUSCLE OF THE TONGUE (2.0 CM, PT2) LYMPHOVASCULAR AND PERINEURAL INVASION IDENTIFIED SQUAMOUS CELL CARCINOMA PRESENTED AT (FINAL MARGINS REFER TO PART 2-6) LATERAL INKED MARGIN (1.0 CM) MEDIAL INKED MARGIN (0.6 CM)      02/08/2016 Surgery    SURGICAL PROCEDURES: 1. Left hemiglossectomy with primary closure. 2. Left selective neck dissection (zones I-III).      03/02/2016 Imaging    CT chest with contrast: No evidence of metastatic disease in the chest. 2. Aortic atherosclerosis.       03/02/2016 Imaging    CT neck with contrast showed : Newly enlarged and round 10 mm right  level 1b lymph node (series 5, image 45). The level 1A nodes remain normal. This right 1 B node is nonspecific but suspicious for progressive nodal metastatic disease. It might be amenable to Ultrasound-guided FNA. 2. Interval left hemiglossectomy and selective left neck dissection with confluent indeterminate soft tissue from the anterior left submandibular space through the left carotid space and obscuring the left level 1B, level 2, and level IIIa nodal stations. This study will serve as a new postoperative baseline of this area. Attention directed on close interval follow-up. 3. Mild soft tissue thickening also along the left lateral tongue resection margin. Attention directed on followup. 4. Chest CT today reported separately      03/14/2016 Procedure    She has normal baseline hearing test      03/15/2016 Procedure    1. Successful placement of a right internal jugular approach power injectable Port-A-Cath. The Port a catheter is ready for immediate use. 2. Successful fluoroscopic insertion of a 20-French pull-through gastrostomy tube. The gastrostomy may be used immediately for medication administration and may be utilized in 24 hrs for the initiation of feeds.      03/15/2016 -  Radiation Therapy    She received concurrent radiation treatment      03/16/2016 -  Chemotherapy    She received weekly cisplatin       04/04/2016 Adverse Reaction    Chemotherapy is placed on hold due to acute renal failure       INTERVAL HISTORY: Please see below for problem oriented charting. She is seen prior to chemotherapy The patient is taking in 4 cans of nutritional supplement per day along with water and some soft diet She denies recent nausea  or vomiting Denies peripheral neuropathy  REVIEW OF SYSTEMS:   Constitutional: Denies fevers, chills or abnormal weight loss Eyes: Denies blurriness of vision Ears, nose, mouth, throat, and face: Denies mucositis or sore throat Respiratory: Denies  cough, dyspnea or wheezes Cardiovascular: Denies palpitation, chest discomfort or lower extremity swelling Gastrointestinal:  Denies nausea, heartburn or change in bowel habits Skin: Denies abnormal skin rashes Lymphatics: Denies new lymphadenopathy or easy bruising Neurological:Denies numbness, tingling or new weaknesses Behavioral/Psych: Mood is stable, no new changes  All other systems were reviewed with the patient and are negative.  I have reviewed the past medical history, past surgical history, social history and family history with the patient and they are unchanged from previous note.  ALLERGIES:  has No Known Allergies.  MEDICATIONS:  Current Outpatient Prescriptions  Medication Sig Dispense Refill  . ALPRAZolam (XANAX) 0.5 MG tablet Take 0.5 mg by mouth at bedtime as needed for sleep.     Marland Kitchen atorvastatin (LIPITOR) 10 MG tablet Take 10 mg by mouth daily at 6 PM.     . buPROPion (ZYBAN) 150 MG 12 hr tablet Take 150 mg by mouth daily.     . chlorhexidine (PERIDEX) 0.12 % solution Rinse with 15 mls three times daily for 30 seconds. Use after breakfast, dinner, and at bedtime. Spit out excess. Do not swallow. 480 mL prn  . fentaNYL (DURAGESIC - DOSED MCG/HR) 25 MCG/HR patch Place 1 patch (25 mcg total) onto the skin every 3 (three) days. 5 patch 0  . fluconazole (DIFLUCAN) 100 MG tablet Take 2 tablets today, then 1 tablet daily x 20 more days. Hold Atorvastatin while on this drug. 22 tablet 0  . levothyroxine (SYNTHROID, LEVOTHROID) 75 MCG tablet Take 75 mcg by mouth daily before breakfast.     . lidocaine (XYLOCAINE) 2 % solution Patient: Mix 1part 2% viscous lidocaine, 1part H20. Swish and swallow 57m of this mixture, 343m before meals and at bedtime, up to QID 100 mL 5  . lidocaine-prilocaine (EMLA) cream Apply to affected area once 30 g 3  . Morphine Sulfate (MORPHINE CONCENTRATE) 10 mg / 0.5 ml concentrated solution Take 0.5 mLs (10 mg total) by mouth every 2 (two) hours as  needed for severe pain. 120 mL 0  . Nutritional Supplements (FEEDING SUPPLEMENT, OSMOLITE 1.5 CAL,) LIQD Begin Osmolite 1.5 via PEG, 1 can QID with 60 mL free water before and after. Increase to goal rate of 2 cans BID and 1.5 cans BID via PEG. Give additional 240 mL free water TID between feedings. Please send formula and supplies. 1659 mL 0  . ondansetron (ZOFRAN) 8 MG tablet Take 1 tablet (8 mg total) by mouth 2 (two) times daily as needed. Start on the third day after chemotherapy. 30 tablet 1  . prochlorperazine (COMPAZINE) 10 MG tablet Take 1 tablet (10 mg total) by mouth every 6 (six) hours as needed (Nausea or vomiting). 30 tablet 1  . senna (SENOKOT) 8.6 MG tablet Take 1 tablet by mouth 2 (two) times daily.    . Marland Kitchenriamcinolone (KENALOG) 0.1 % paste APPLY TO AFFECTED AREA    . zolpidem (AMBIEN) 10 MG tablet Take 10 mg by mouth at bedtime.     . Marland KitchenYDROcodone-acetaminophen (HYCET) 7.5-325 mg/15 ml solution Take 10-15 mLs by mouth every 4 (four) hours as needed for moderate pain. (Patient not taking: Reported on 04/02/2016) 473 mL 0   No current facility-administered medications for this visit.    Facility-Administered Medications Ordered in Other Visits  Medication Dose Route Frequency Provider Last Rate Last Dose  . heparin lock flush 100 unit/mL  500 Units Intracatheter Once PRN [name], MD      . ondansetron (ZOFRAN) injection 8 mg  8 mg Intravenous Once [name], MD      . sodium chloride flush (NS) 0.9 % injection 10 mL  10 mL Intracatheter PRN [name], MD        PHYSICAL EXAMINATION: ECOG PERFORMANCE STATUS: 0 - Asymptomatic  Vitals:   04/05/16 1006  BP: 115/66  Pulse: 95  Resp: 18  Temp: 98.3 F (36.8 C)   Filed Weights   04/05/16 1006  Weight: 210 lb 12.8 oz (95.6 kg)    GENERAL:alert, no distress and comfortable SKIN: skin color, texture, turgor are normal, no rashes or significant lesions EYES: normal, Conjunctiva are pink and non-injected, sclera  clear OROPHARYNX:no exudate, no erythema and lips, buccal mucosa, and tongue normal  NECK: supple, thyroid normal size, non-tender, without nodularity LYMPH:  no palpable lymphadenopathy in the cervical, axillary or inguinal LUNGS: clear to auscultation and percussion with normal breathing effort HEART: regular rate & rhythm and no murmurs and no lower extremity edema ABDOMEN:abdomen soft, non-tender and normal bowel sounds Musculoskeletal:no cyanosis of digits and no clubbing  NEURO: alert & oriented x 3 with fluent speech, no focal motor/sensory deficits  LABORATORY DATA:  I have reviewed the data as listed    Component Value Date/Time   NA 134 (L) 04/04/2016 0955   K 4.7 04/04/2016 0955   CL 99 (L) 02/07/2016 0836   CO2 28 04/04/2016 0955   GLUCOSE 108 04/04/2016 0955   BUN 19.4 04/04/2016 0955   CREATININE 1.8 (H) 04/04/2016 0955   CALCIUM 9.5 04/04/2016 0955   PROT 7.3 04/04/2016 0955   ALBUMIN 3.7 04/04/2016 0955   AST 22 04/04/2016 0955   ALT 17 04/04/2016 0955   ALKPHOS 119 04/04/2016 0955   BILITOT 0.57 04/04/2016 0955   GFRNONAA 45 (L) 02/08/2016 1859   GFRAA 52 (L) 02/08/2016 1859    No results found for: SPEP, UPEP  Lab Results  Component Value Date   WBC 4.8 04/04/2016   NEUTROABS 3.8 04/04/2016   HGB 11.7 04/04/2016   HCT 33.0 (L) 04/04/2016   MCV 82.0 04/04/2016   PLT 187 04/04/2016      Chemistry      Component Value Date/Time   NA 134 (L) 04/04/2016 0955   K 4.7 04/04/2016 0955   CL 99 (L) 02/07/2016 0836   CO2 28 04/04/2016 0955   BUN 19.4 04/04/2016 0955   CREATININE 1.8 (H) 04/04/2016 0955      Component Value Date/Time   CALCIUM 9.5 04/04/2016 0955   ALKPHOS 119 04/04/2016 0955   AST 22 04/04/2016 0955   ALT 17 04/04/2016 0955   BILITOT 0.57 04/04/2016 0955       RADIOGRAPHIC STUDIES: I have personally reviewed the radiological images as listed and agreed with the findings in the report. Ir Gastrostomy Tube Mod Sed  Result Date:  03/15/2016 INDICATION: History of tongue cancer. In need of durable intravenous access for chemotherapy administration. Please perform percutaneous gastrostomy tube for enteric nutrition supplementation prior to the initiation of chemoradiation. EXAM: 1. IMPLANTED PORT A CATH PLACEMENT WITH ULTRASOUND AND FLUOROSCOPIC GUIDANCE 2. FLUOROSCOPIC GUIDED PERCUTANEOUS GASTROSTOMY TUBE PLACEMENT COMPARISON:  CHEST CT - 03/02/2016 MEDICATIONS: Ancef 2 gm IV; The antibiotic was administered within an appropriate time interval prior to skin puncture. ANESTHESIA/SEDATION: Moderate (conscious) sedation was employed  during this procedure. A total of Versed 7 mg and Fentanyl 225 mcg was administered intravenously. Moderate Sedation Time: 43 minutes. The patient's level of consciousness and vital signs were monitored continuously by radiology nursing throughout the procedure under my direct supervision. CONTRAST:  20 cc Isovue 300, administered into the stomach lumen. FLUOROSCOPY TIME:  A total of 5 minutes, 12 seconds (279 mGy) fluoroscopy time was utilized for both procedures. COMPLICATIONS: None immediate. PROCEDURE: The procedures, risks, benefits, and alternatives were explained to the patient. Questions regarding the procedure were encouraged and answered. The patient understands and consents to both procedures. Attention was initially paid towards placement of the [facility] a Catheter. The right neck and chest were prepped with chlorhexidine in a sterile fashion, and a sterile drape was applied covering the operative field. Maximum barrier sterile technique with sterile gowns and gloves were used for the procedure. A timeout was performed prior to the initiation of the procedure. Local anesthesia was provided with 1% lidocaine with epinephrine. After creating a small venotomy incision, a micropuncture kit was utilized to access the internal jugular vein under direct, real-time ultrasound guidance. Ultrasound image documentation  was performed. The microwire was kinked to measure appropriate catheter length. A subcutaneous port pocket was then created along the upper chest wall utilizing a combination of sharp and blunt dissection. The pocket was irrigated with sterile saline. A single lumen ISP power injectable port was chosen for placement. The 8 Fr catheter was tunneled from the port pocket site to the venotomy incision. The port was placed in the pocket. The external catheter was trimmed to appropriate length. At the venotomy, an 8 Fr peel-away sheath was placed over a guidewire under fluoroscopic guidance. The catheter was then placed through the sheath and the sheath was removed. Final catheter positioning was confirmed and documented with a fluoroscopic spot radiograph. The port was accessed with a Huber needle, aspirated and flushed with heparinized saline. The venotomy site was closed with an interrupted 4-0 Vicryl suture. The port pocket incision was closed with interrupted 2-0 Vicryl suture and the skin was opposed with a running subcuticular 4-0 Vicryl suture. Dermabond and Steri-strips were applied to both incisions. Dressings were placed. Next, attention was paid towards placement of the gastrostomy tube. The left upper quadrant was sterilely prepped and draped. An oral gastric catheter was inserted into the stomach under fluoroscopy. The existing nasogastric feeding tube was removed. The left costal margin and air / barium opacified transverse colon were identified and avoided. Air was injected into the stomach for insufflation and visualization under fluoroscopy. Under sterile conditions a 17 gauge trocar needle was utilized to access the stomach percutaneously beneath the left subcostal margin after the overlying soft tissues were anesthetized with 1% Lidocaine with epinephrine. Needle position was confirmed within the stomach with aspiration of air and injection of small amount of contrast. A single T tack was deployed for  gastropexy. Over an Amplatz guide wire, a 9-French sheath was inserted into the stomach. A snare device was utilized to capture the oral gastric catheter. The snare device was pulled retrograde from the stomach up the esophagus and out the oropharynx. The 20-French pull-through gastrostomy was connected to the snare device and pulled antegrade through the oropharynx down the esophagus into the stomach and then through the percutaneous tract external to the patient. The gastrostomy was assembled externally. Contrast injection confirms position in the stomach. Several spot radiographic images were obtained in various obliquities for documentation. Dressings were placed. The patient tolerated  both above procedures well without immediate post procedural complication. FINDINGS: After catheter placement, the tip lies within the superior cavoatrial junction. The catheter aspirates and flushes normally and is ready for immediate use. After successful fluoroscopic guided placement, the gastrostomy tube is appropriately positioned with internal disc against the ventral aspect of the gastric lumen. IMPRESSION: 1. Successful placement of a right internal jugular approach power injectable Port-A-Cath. The Port a catheter is ready for immediate use. 2. Successful fluoroscopic insertion of a 20-French pull-through gastrostomy tube. The gastrostomy may be used immediately for medication administration and may be utilized in 24 hrs for the initiation of feeds. Electronically Signed   By: Sandi Mariscal M.D.   On: 03/15/2016 16:56   Ir US Guide Vasc Access Right  Result Date: 03/15/2016 INDICATION: History of tongue cancer. In need of durable intravenous access for chemotherapy administration. Please perform percutaneous gastrostomy tube for enteric nutrition supplementation prior to the initiation of chemoradiation. EXAM: 1. IMPLANTED PORT A CATH PLACEMENT WITH ULTRASOUND AND FLUOROSCOPIC GUIDANCE 2. FLUOROSCOPIC GUIDED  PERCUTANEOUS GASTROSTOMY TUBE PLACEMENT COMPARISON:  CHEST CT - 03/02/2016 MEDICATIONS: Ancef 2 gm IV; The antibiotic was administered within an appropriate time interval prior to skin puncture. ANESTHESIA/SEDATION: Moderate (conscious) sedation was employed during this procedure. A total of Versed 7 mg and Fentanyl 225 mcg was administered intravenously. Moderate Sedation Time: 43 minutes. The patient's level of consciousness and vital signs were monitored continuously by radiology nursing throughout the procedure under my direct supervision. CONTRAST:  20 cc Isovue 300, administered into the stomach lumen. FLUOROSCOPY TIME:  A total of 5 minutes, 12 seconds (279 mGy) fluoroscopy time was utilized for both procedures. COMPLICATIONS: None immediate. PROCEDURE: The procedures, risks, benefits, and alternatives were explained to the patient. Questions regarding the procedure were encouraged and answered. The patient understands and consents to both procedures. Attention was initially paid towards placement of the [facility] a Catheter. The right neck and chest were prepped with chlorhexidine in a sterile fashion, and a sterile drape was applied covering the operative field. Maximum barrier sterile technique with sterile gowns and gloves were used for the procedure. A timeout was performed prior to the initiation of the procedure. Local anesthesia was provided with 1% lidocaine with epinephrine. After creating a small venotomy incision, a micropuncture kit was utilized to access the internal jugular vein under direct, real-time ultrasound guidance. Ultrasound image documentation was performed. The microwire was kinked to measure appropriate catheter length. A subcutaneous port pocket was then created along the upper chest wall utilizing a combination of sharp and blunt dissection. The pocket was irrigated with sterile saline. A single lumen ISP power injectable port was chosen for placement. The 8 Fr catheter was tunneled  from the port pocket site to the venotomy incision. The port was placed in the pocket. The external catheter was trimmed to appropriate length. At the venotomy, an 8 Fr peel-away sheath was placed over a guidewire under fluoroscopic guidance. The catheter was then placed through the sheath and the sheath was removed. Final catheter positioning was confirmed and documented with a fluoroscopic spot radiograph. The port was accessed with a Huber needle, aspirated and flushed with heparinized saline. The venotomy site was closed with an interrupted 4-0 Vicryl suture. The port pocket incision was closed with interrupted 2-0 Vicryl suture and the skin was opposed with a running subcuticular 4-0 Vicryl suture. Dermabond and Steri-strips were applied to both incisions. Dressings were placed. Next, attention was paid towards placement of the gastrostomy tube. The  left upper quadrant was sterilely prepped and draped. An oral gastric catheter was inserted into the stomach under fluoroscopy. The existing nasogastric feeding tube was removed. The left costal margin and air / barium opacified transverse colon were identified and avoided. Air was injected into the stomach for insufflation and visualization under fluoroscopy. Under sterile conditions a 17 gauge trocar needle was utilized to access the stomach percutaneously beneath the left subcostal margin after the overlying soft tissues were anesthetized with 1% Lidocaine with epinephrine. Needle position was confirmed within the stomach with aspiration of air and injection of small amount of contrast. A single T tack was deployed for gastropexy. Over an Amplatz guide wire, a 9-French sheath was inserted into the stomach. A snare device was utilized to capture the oral gastric catheter. The snare device was pulled retrograde from the stomach up the esophagus and out the oropharynx. The 20-French pull-through gastrostomy was connected to the snare device and pulled antegrade  through the oropharynx down the esophagus into the stomach and then through the percutaneous tract external to the patient. The gastrostomy was assembled externally. Contrast injection confirms position in the stomach. Several spot radiographic images were obtained in various obliquities for documentation. Dressings were placed. The patient tolerated both above procedures well without immediate post procedural complication. FINDINGS: After catheter placement, the tip lies within the superior cavoatrial junction. The catheter aspirates and flushes normally and is ready for immediate use. After successful fluoroscopic guided placement, the gastrostomy tube is appropriately positioned with internal disc against the ventral aspect of the gastric lumen. IMPRESSION: 1. Successful placement of a right internal jugular approach power injectable Port-A-Cath. The Port a catheter is ready for immediate use. 2. Successful fluoroscopic insertion of a 20-French pull-through gastrostomy tube. The gastrostomy may be used immediately for medication administration and may be utilized in 24 hrs for the initiation of feeds. Electronically Signed   By: Sandi Mariscal M.D.   On: 03/15/2016 16:56   Dg Swallowing Func-speech Pathology  Result Date: 03/13/2016 Objective Swallowing Evaluation: Type of Study: MBS-Modified Barium Swallow Study Patient Details Name: TAWNIA SCHIRM MRN: [id number] Date of Birth: Dec 21, 1955 Today's Date: 03/13/2016 Time: SLP Start Time (ACUTE ONLY): 1343-SLP Stop Time (ACUTE ONLY): 1410 SLP Time Calculation (min) (ACUTE ONLY): 27 min Past Medical History: Past Medical History: Diagnosis Date . Anxiety  . Arthritis  . Depression   takes ZYban daily . History of colon polyps   benign . Hyperlipidemia   takes Atorvastatin daily . Hypertension   takes HCTZ daily . Hypothyroidism   takes Synthroid daily . Insomnia   takes Ambien nightly and Xanax as needed . Refusal of blood transfusions as patient is Jehovah's Witness   Past Surgical History: Past Surgical History: Procedure Laterality Date . CHOLECYSTECTOMY   . COLONOSCOPY   . DILATION AND CURETTAGE OF UTERUS   . EXCISION OF TONGUE LESION Left 02/08/2016  Procedure: WIDE LOCAL EXCISION OF LEFT LATERAL TONGUE;  Surgeon: Jerrell Belfast, MD;  Location: [place];  Service: ENT;  Laterality: Left; . FEMUR IM NAIL Right 02/28/2012  Procedure: OPEN REDUCTION INTERNAL FIXATION (ORIF) RIGHT KNEE;  Surgeon: Alta Corning, MD;  Location: [place];  Service: Orthopedics;  Laterality: Right; . KNEE ARTHROPLASTY  12/14/2011  Procedure: COMPUTER ASSISTED TOTAL KNEE ARTHROPLASTY;  Surgeon: Alta Corning, MD;  Location: [place];  Service: Orthopedics;  Laterality: Right; . KNEE ARTHROSCOPY    Right . PLANTAR FASCIA SURGERY    right foot . RADICAL NECK DISSECTION Left 02/08/2016  Procedure: LEFT SELECTIVE RADICAL NECK DISSECTION;  Surgeon: Jerrell Belfast, MD;  Location: [facility] OR;  Service: ENT;  Laterality: Left; . TENDON RELEASE    right wrist . TOTAL KNEE ARTHROPLASTY  12/14/2011  right knee HPI: Pt is 85 yof with dx of anterior lt lateral tongue SCCA S/P lt neck dissection and lateral tongue resection on 02-08-16. Rad tx begins 03-15-16, chemo begins 03-16-16. Pt with PEG placement Thursday 03-15-16.  Pt reports improved swallow/speech since recovery from surgery.   Subjective: pt ambulated easily into xray department, friendly Assessment / Plan / Recommendation CH[facility] IP CLINICAL IMPRESSIONS 03/13/2016 Clinical Impression Pt presents with mild oral dysphagia due to structural changes and ? 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  Oral difficultlies characterized by oral discoordination/transiting difficulties.  She compensates well by piecemealing and placing boluses on right side of oral cavity.  Pharyngeal swallow was strong with only occasional minimal residuals of thin liquids.  No aspiration or penetration noted.  Barium tablet taken with pudding after inability to orally  transit with thin transited pharynx easily.  Tablet appeared to lodge in mid=esophagus with initial referrant sensastion to pharynx - that abated with pt reflexively swallowing.  Pt appeared with backflow of liquids at mid=esophagus when taken to clear tablet - without sensation.  Advised pt to assure adequate liquid intake with meds with puree.  Encouraged pt to continue po as tolerated during XRT and provided written handout for xerostomia, possible radiation side effectis.  Provided pt with syringe to use for liquid adminiistration/oral care as XRT progressed.  Thanks for this referral.  SLP Visit Diagnosis Dysphagia, oral phase (R13.11) Attention and concentration deficit following -- Frontal lobe and executive function deficit following -- Impact on safety and function Mild aspiration risk     CHL IP DIET RECOMMENDATION 03/13/2016 SLP Diet Recommendations Dysphagia 3 (Mech soft) solids;Thin liquid Liquid Administration via Cup;Straw Medication Administration Whole meds with puree Compensations Slow rate;Small sips/bites;Lingual sweep for clearance of pocketing Postural Changes Remain semi-upright after after feeds/meals (Comment);Seated upright at 90 degrees   CHL IP OTHER RECOMMENDATIONS 03/13/2016 Recommended Consults (No Data) Oral Care Recommendations Oral care BID Other Recommendations --   CHL IP FOLLOW UP RECOMMENDATIONS 03/13/2016 Follow up Recommendations (No Data)   No flowsheet data found.     CHL IP ORAL PHASE 03/13/2016 Oral Phase Impaired Oral - Pudding Teaspoon -- Oral - Pudding Cup -- Oral - Honey Teaspoon -- Oral - Honey Cup -- Oral - Nectar Teaspoon -- Oral - Nectar Cup Weak lingual manipulation;Reduced posterior propulsion;Piecemeal swallowing;Premature spillage Oral - Nectar Straw -- Oral - Thin Teaspoon -- Oral - Thin Cup Weak lingual manipulation;Reduced posterior propulsion;Piecemeal swallowing;Delayed oral transit;Decreased bolus cohesion;Premature spillage Oral - Thin Straw Weak lingual  manipulation;Reduced posterior propulsion;Piecemeal swallowing;Decreased bolus cohesion;Premature spillage Oral - Puree Weak lingual manipulation;Lingual pumping;Reduced posterior propulsion;Piecemeal swallowing;Delayed oral transit;Premature spillage Oral - Mech Soft Impaired mastication;Weak lingual manipulation;Reduced posterior propulsion;Piecemeal swallowing;Delayed oral transit Oral - Regular -- Oral - Multi-Consistency -- Oral - Pill Weak lingual manipulation;Reduced posterior propulsion;Lingual/palatal residue;Piecemeal swallowing;Decreased bolus cohesion Oral Phase - Comment pt placed bolus in right oral cavity to aid transiting, barium tablet placed on right mid=[place] region but needed puree to transit  CHL IP PHARYNGEAL PHASE 03/13/2016 Pharyngeal Phase WFL Pharyngeal- Pudding Teaspoon -- Pharyngeal -- Pharyngeal- Pudding Cup -- Pharyngeal -- Pharyngeal- Honey Teaspoon -- Pharyngeal -- Pharyngeal- Honey Cup -- Pharyngeal -- Pharyngeal- Nectar Teaspoon -- Pharyngeal -- Pharyngeal- Nectar Cup WFL Pharyngeal -- Pharyngeal- Nectar Straw --  Pharyngeal -- Pharyngeal- Thin Teaspoon -- Pharyngeal -- Pharyngeal- Thin Cup [facility] Pharyngeal -- Pharyngeal- Thin Straw WFL Pharyngeal -- Pharyngeal- Puree WFL Pharyngeal -- Pharyngeal- Mechanical Soft WFL Pharyngeal -- Pharyngeal- Regular -- Pharyngeal -- Pharyngeal- Multi-consistency -- Pharyngeal -- Pharyngeal- Pill WFL Pharyngeal -- Pharyngeal Comment --  CHL IP CERVICAL ESOPHAGEAL PHASE 03/13/2016 Cervical Esophageal Phase WFL Pudding Teaspoon -- Pudding Cup -- Honey Teaspoon -- Honey Cup -- Nectar Teaspoon -- Nectar Cup -- Nectar Straw -- Thin Teaspoon -- Thin Cup -- Thin Straw -- Puree -- Mechanical Soft -- Regular -- Multi-consistency -- Pill -- Cervical Esophageal Comment -- CHL IP GO 03/13/2016 Functional Assessment Tool Used MBS, clinical judgement Functional Limitations Swallowing Swallow Current Status (C5852) CI Swallow Goal Status (D7824) CI Swallow Discharge  Status (M3536) CI Luanna Salk, MS [employer] SLP [phone] Macario Golds 03/13/2016, 7:20 PM              Ir Cyndy Freeze Guide Port Insertion Right  Result Date: 03/15/2016 INDICATION: History of tongue cancer. In need of durable intravenous access for chemotherapy administration. Please perform percutaneous gastrostomy tube for enteric nutrition supplementation prior to the initiation of chemoradiation. EXAM: 1. IMPLANTED PORT A CATH PLACEMENT WITH ULTRASOUND AND FLUOROSCOPIC GUIDANCE 2. FLUOROSCOPIC GUIDED PERCUTANEOUS GASTROSTOMY TUBE PLACEMENT COMPARISON:  CHEST CT - 03/02/2016 MEDICATIONS: Ancef 2 gm IV; The antibiotic was administered within an appropriate time interval prior to skin puncture. ANESTHESIA/SEDATION: Moderate (conscious) sedation was employed during this procedure. A total of Versed 7 mg and Fentanyl 225 mcg was administered intravenously. Moderate Sedation Time: 43 minutes. The patient's level of consciousness and vital signs were monitored continuously by radiology nursing throughout the procedure under my direct supervision. CONTRAST:  20 cc Isovue 300, administered into the stomach lumen. FLUOROSCOPY TIME:  A total of 5 minutes, 12 seconds (279 mGy) fluoroscopy time was utilized for both procedures. COMPLICATIONS: None immediate. PROCEDURE: The procedures, risks, benefits, and alternatives were explained to the patient. Questions regarding the procedure were encouraged and answered. The patient understands and consents to both procedures. Attention was initially paid towards placement of the [facility] a Catheter. The right neck and chest were prepped with chlorhexidine in a sterile fashion, and a sterile drape was applied covering the operative field. Maximum barrier sterile technique with sterile gowns and gloves were used for the procedure. A timeout was performed prior to the initiation of the procedure. Local anesthesia was provided with 1% lidocaine with epinephrine. After creating a small  venotomy incision, a micropuncture kit was utilized to access the internal jugular vein under direct, real-time ultrasound guidance. Ultrasound image documentation was performed. The microwire was kinked to measure appropriate catheter length. A subcutaneous port pocket was then created along the upper chest wall utilizing a combination of sharp and blunt dissection. The pocket was irrigated with sterile saline. A single lumen ISP power injectable port was chosen for placement. The 8 Fr catheter was tunneled from the port pocket site to the venotomy incision. The port was placed in the pocket. The external catheter was trimmed to appropriate length. At the venotomy, an 8 Fr peel-away sheath was placed over a guidewire under fluoroscopic guidance. The catheter was then placed through the sheath and the sheath was removed. Final catheter positioning was confirmed and documented with a fluoroscopic spot radiograph. The port was accessed with a Huber needle, aspirated and flushed with heparinized saline. The venotomy site was closed with an interrupted 4-0 Vicryl suture. The port pocket incision was closed with interrupted 2-0 Vicryl  suture and the skin was opposed with a running subcuticular 4-0 Vicryl suture. Dermabond and Steri-strips were applied to both incisions. Dressings were placed. Next, attention was paid towards placement of the gastrostomy tube. The left upper quadrant was sterilely prepped and draped. An oral gastric catheter was inserted into the stomach under fluoroscopy. The existing nasogastric feeding tube was removed. The left costal margin and air / barium opacified transverse colon were identified and avoided. Air was injected into the stomach for insufflation and visualization under fluoroscopy. Under sterile conditions a 17 gauge trocar needle was utilized to access the stomach percutaneously beneath the left subcostal margin after the overlying soft tissues were anesthetized with 1% Lidocaine  with epinephrine. Needle position was confirmed within the stomach with aspiration of air and injection of small amount of contrast. A single T tack was deployed for gastropexy. Over an Amplatz guide wire, a 9-French sheath was inserted into the stomach. A snare device was utilized to capture the oral gastric catheter. The snare device was pulled retrograde from the stomach up the esophagus and out the oropharynx. The 20-French pull-through gastrostomy was connected to the snare device and pulled antegrade through the oropharynx down the esophagus into the stomach and then through the percutaneous tract external to the patient. The gastrostomy was assembled externally. Contrast injection confirms position in the stomach. Several spot radiographic images were obtained in various obliquities for documentation. Dressings were placed. The patient tolerated both above procedures well without immediate post procedural complication. FINDINGS: After catheter placement, the tip lies within the superior cavoatrial junction. The catheter aspirates and flushes normally and is ready for immediate use. After successful fluoroscopic guided placement, the gastrostomy tube is appropriately positioned with internal disc against the ventral aspect of the gastric lumen. IMPRESSION: 1. Successful placement of a right internal jugular approach power injectable Port-A-Cath. The Port a catheter is ready for immediate use. 2. Successful fluoroscopic insertion of a 20-French pull-through gastrostomy tube. The gastrostomy may be used immediately for medication administration and may be utilized in 24 hrs for the initiation of feeds. Electronically Signed   By: Sandi Mariscal M.D.   On: 03/15/2016 16:56    ASSESSMENT & PLAN:  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[place]) The patient has worsening renal failure I will hold chemotherapy this week and will start her on IV hydration therapy daily. We will recheck blood work again next week and  determine if she can resume further chemotherapy.  Acute renal failure ([place]) This is secondary to poor oral intake, dehydration and recent chemotherapy. I will hold chemotherapy this week and start her on aggressive IV fluid hydration daily   No orders of the defined types were placed in this encounter.  All questions were answered. The patient knows to call the clinic with any problems, questions or concerns. No barriers to learning was detected. I spent 15 minutes counseling the patient face to face. The total time spent in the appointment was 20 minutes and more than 50% was on counseling and review of test results     [name], MD 04/05/2016 1:17 PM

## 2016-04-05 NOTE — Telephone Encounter (Signed)
Gave patient AVS and calender per 4/5 - patient okay with early lab and flush appts before rad onc and treatment.

## 2016-04-05 NOTE — Assessment & Plan Note (Signed)
This is secondary to poor oral intake, dehydration and recent chemotherapy. I will hold chemotherapy this week and start her on aggressive IV fluid hydration daily

## 2016-04-05 NOTE — Patient Instructions (Signed)
Dehydration, Adult Dehydration is a condition in which there is not enough fluid or water in the body. This happens when you lose more fluids than you take in. Important organs, such as the kidneys, brain, and heart, cannot function without a proper amount of fluids. Any loss of fluids from the body can lead to dehydration. Dehydration can range from mild to severe. This condition should be treated right away to prevent it from becoming severe. What are the causes? This condition may be caused by:  Vomiting.  Diarrhea.  Excessive sweating, such as from heat exposure or exercise.  Not drinking enough fluid, especially:  When ill.  While doing activity that requires a lot of energy.  Excessive urination.  Fever.  Infection.  Certain medicines, such as medicines that cause the body to lose excess fluid (diuretics).  Inability to access safe drinking water.  Reduced physical ability to get adequate water and food. What increases the risk? This condition is more likely to develop in people:  Who have a poorly controlled long-term (chronic) illness, such as diabetes, heart disease, or kidney disease.  Who are age 65 or older.  Who are disabled.  Who live in a place with high altitude.  Who play endurance sports. What are the signs or symptoms? Symptoms of mild dehydration may include:   Thirst.  Dry lips.  Slightly dry mouth.  Dry, warm skin.  Dizziness. Symptoms of moderate dehydration may include:   Very dry mouth.  Muscle cramps.  Dark urine. Urine may be the color of tea.  Decreased urine production.  Decreased tear production.  Heartbeat that is irregular or faster than normal (palpitations).  Headache.  Light-headedness, especially when you stand up from a sitting position.  Fainting (syncope). Symptoms of severe dehydration may include:   Changes in skin, such as:  Cold and clammy skin.  Blotchy (mottled) or pale skin.  Skin that does  not quickly return to normal after being lightly pinched and released (poor skin turgor).  Changes in body fluids, such as:  Extreme thirst.  No tear production.  Inability to sweat when body temperature is high, such as in hot weather.  Very little urine production.  Changes in vital signs, such as:  Weak pulse.  Pulse that is more than 100 beats a minute when sitting still.  Rapid breathing.  Low blood pressure.  Other changes, such as:  Sunken eyes.  Cold hands and feet.  Confusion.  Lack of energy (lethargy).  Difficulty waking up from sleep.  Short-term weight loss.  Unconsciousness. How is this diagnosed? This condition is diagnosed based on your symptoms and a physical exam. Blood and urine tests may be done to help confirm the diagnosis. How is this treated? Treatment for this condition depends on the severity. Mild or moderate dehydration can often be treated at home. Treatment should be started right away. Do not wait until dehydration becomes severe. Severe dehydration is an emergency and it needs to be treated in a hospital. Treatment for mild dehydration may include:   Drinking more fluids.  Replacing salts and minerals in your blood (electrolytes) that you may have lost. Treatment for moderate dehydration may include:   Drinking an oral rehydration solution (ORS). This is a drink that helps you replace fluids and electrolytes (rehydrate). It can be found at pharmacies and retail stores. Treatment for severe dehydration may include:   Receiving fluids through an IV tube.  Receiving an electrolyte solution through a feeding tube that is   passed through your nose and into your stomach (nasogastric tube, or NG tube).  Correcting any abnormalities in electrolytes.  Treating the underlying cause of dehydration. Follow these instructions at home:  If directed by your health care provider, drink an ORS:  Make an ORS by following instructions on the  package.  Start by drinking small amounts, about  cup (120 mL) every 5-10 minutes.  Slowly increase how much you drink until you have taken the amount recommended by your health care provider.  Drink enough clear fluid to keep your urine clear or pale yellow. If you were told to drink an ORS, finish the ORS first, then start slowly drinking other clear fluids. Drink fluids such as:  Water. Do not drink only water. Doing that can lead to having too little salt (sodium) in the body (hyponatremia).  Ice chips.  Fruit juice that you have added water to (diluted fruit juice).  Low-calorie sports drinks.  Avoid:  Alcohol.  Drinks that contain a lot of sugar. These include high-calorie sports drinks, fruit juice that is not diluted, and soda.  Caffeine.  Foods that are greasy or contain a lot of fat or sugar.  Take over-the-counter and prescription medicines only as told by your health care provider.  Do not take sodium tablets. This can lead to having too much sodium in the body (hypernatremia).  Eat foods that contain a healthy balance of electrolytes, such as bananas, oranges, potatoes, tomatoes, and spinach.  Keep all follow-up visits as told by your health care provider. This is important. Contact a health care provider if:  You have abdominal pain that:  Gets worse.  Stays in one area (localizes).  You have a rash.  You have a stiff neck.  You are more irritable than usual.  You are sleepier or more difficult to wake up than usual.  You feel weak or dizzy.  You feel very thirsty.  You have urinated only a small amount of very dark urine over 6-8 hours. Get help right away if:  You have symptoms of severe dehydration.  You cannot drink fluids without vomiting.  Your symptoms get worse with treatment.  You have a fever.  You have a severe headache.  You have vomiting or diarrhea that:  Gets worse.  Does not go away.  You have blood or green matter  (bile) in your vomit.  You have blood in your stool. This may cause stool to look black and tarry.  You have not urinated in 6-8 hours.  You faint.  Your heart rate while sitting still is over 100 beats a minute.  You have trouble breathing. This information is not intended to replace advice given to you by your health care provider. Make sure you discuss any questions you have with your health care provider. Document Released: 12/18/2004 Document Revised: 07/15/2015 Document Reviewed: 02/11/2015 Elsevier Interactive Patient Education  2017 [employer]  

## 2016-04-05 NOTE — Assessment & Plan Note (Signed)
The patient has worsening renal failure I will hold chemotherapy this week and will start her on IV hydration therapy daily. We will recheck blood work again next week and determine if she can resume further chemotherapy.

## 2016-04-05 NOTE — Progress Notes (Signed)
RN visit for IV fluids. 

## 2016-04-05 NOTE — Progress Notes (Signed)
Oncology Nurse Navigator Documentation  To provide support, encouragement and care continuity, met with Ms. Ayoub during Established Patient appt with Dr. Alvy Bimler.  She was unaccompanied. She reported:  Absence of throat pain.  Apply Sonafine BID.  Started using PEG yesterday for nutritional supplement.   Continues oral intake, supplement, soft foods (eg mashed potatoes w gravy), drinking liquids. Voiced understanding of the scheduling of IVF M-S for next several weeks d/t elevated creatinine. She understands I can be contacted with needs/concerns.  [name], RN, BSN, [place] Oncology Nurse [place] [facility] [place] [phone]

## 2016-04-06 ENCOUNTER — Ambulatory Visit
Admission: RE | Admit: 2016-04-06 | Discharge: 2016-04-06 | Disposition: A | Discharge status: Home / Self Care | Payer: 59 | Source: Ambulatory Visit | Attending: Radiation Oncology | Admitting: Radiation Oncology

## 2016-04-06 ENCOUNTER — Ambulatory Visit (HOSPITAL_BASED_OUTPATIENT_CLINIC_OR_DEPARTMENT_OTHER): Payer: 59

## 2016-04-06 ENCOUNTER — Encounter: Payer: 59 | Admitting: Nutrition

## 2016-04-06 ENCOUNTER — Telehealth: Payer: Self-pay | Admitting: Hematology and Oncology

## 2016-04-06 VITALS — BP 112/57 | HR 84 | Temp 97.5°F | Resp 18

## 2016-04-06 DIAGNOSIS — Z510 Encounter for antineoplastic radiation therapy: Secondary | ICD-10-CM | POA: Diagnosis not present

## 2016-04-06 DIAGNOSIS — C023 Malignant neoplasm of anterior two-thirds of tongue, part unspecified: Secondary | ICD-10-CM | POA: Diagnosis not present

## 2016-04-06 DIAGNOSIS — N179 Acute kidney failure, unspecified: Secondary | ICD-10-CM | POA: Diagnosis not present

## 2016-04-06 MED ORDER — SODIUM CHLORIDE 0.9 % IV SOLN
Freq: Once | INTRAVENOUS | Status: AC
  Administered 2016-04-06: 10:00:00 via INTRAVENOUS

## 2016-04-06 MED ORDER — HEPARIN SOD (PORK) LOCK FLUSH 100 UNIT/ML IV SOLN
500.0000 [IU] | Freq: Once | INTRAVENOUS | Status: AC | PRN
  Administered 2016-04-06: 500 [IU]
  Filled 2016-04-06: qty 5

## 2016-04-06 MED ORDER — SODIUM CHLORIDE 0.9% FLUSH
10.0000 mL | INTRAVENOUS | Status: DC | PRN
  Administered 2016-04-06: 10 mL
  Filled 2016-04-06: qty 10

## 2016-04-06 NOTE — Patient Instructions (Signed)
Dehydration, Adult Dehydration is a condition in which there is not enough fluid or water in the body. This happens when you lose more fluids than you take in. Important organs, such as the kidneys, brain, and heart, cannot function without a proper amount of fluids. Any loss of fluids from the body can lead to dehydration. Dehydration can range from mild to severe. This condition should be treated right away to prevent it from becoming severe. What are the causes? This condition may be caused by:  Vomiting.  Diarrhea.  Excessive sweating, such as from heat exposure or exercise.  Not drinking enough fluid, especially:  When ill.  While doing activity that requires a lot of energy.  Excessive urination.  Fever.  Infection.  Certain medicines, such as medicines that cause the body to lose excess fluid (diuretics).  Inability to access safe drinking water.  Reduced physical ability to get adequate water and food. What increases the risk? This condition is more likely to develop in people:  Who have a poorly controlled long-term (chronic) illness, such as diabetes, heart disease, or kidney disease.  Who are age 65 or older.  Who are disabled.  Who live in a place with high altitude.  Who play endurance sports. What are the signs or symptoms? Symptoms of mild dehydration may include:   Thirst.  Dry lips.  Slightly dry mouth.  Dry, warm skin.  Dizziness. Symptoms of moderate dehydration may include:   Very dry mouth.  Muscle cramps.  Dark urine. Urine may be the color of tea.  Decreased urine production.  Decreased tear production.  Heartbeat that is irregular or faster than normal (palpitations).  Headache.  Light-headedness, especially when you stand up from a sitting position.  Fainting (syncope). Symptoms of severe dehydration may include:   Changes in skin, such as:  Cold and clammy skin.  Blotchy (mottled) or pale skin.  Skin that does  not quickly return to normal after being lightly pinched and released (poor skin turgor).  Changes in body fluids, such as:  Extreme thirst.  No tear production.  Inability to sweat when body temperature is high, such as in hot weather.  Very little urine production.  Changes in vital signs, such as:  Weak pulse.  Pulse that is more than 100 beats a minute when sitting still.  Rapid breathing.  Low blood pressure.  Other changes, such as:  Sunken eyes.  Cold hands and feet.  Confusion.  Lack of energy (lethargy).  Difficulty waking up from sleep.  Short-term weight loss.  Unconsciousness. How is this diagnosed? This condition is diagnosed based on your symptoms and a physical exam. Blood and urine tests may be done to help confirm the diagnosis. How is this treated? Treatment for this condition depends on the severity. Mild or moderate dehydration can often be treated at home. Treatment should be started right away. Do not wait until dehydration becomes severe. Severe dehydration is an emergency and it needs to be treated in a hospital. Treatment for mild dehydration may include:   Drinking more fluids.  Replacing salts and minerals in your blood (electrolytes) that you may have lost. Treatment for moderate dehydration may include:   Drinking an oral rehydration solution (ORS). This is a drink that helps you replace fluids and electrolytes (rehydrate). It can be found at pharmacies and retail stores. Treatment for severe dehydration may include:   Receiving fluids through an IV tube.  Receiving an electrolyte solution through a feeding tube that is   passed through your nose and into your stomach (nasogastric tube, or NG tube).  Correcting any abnormalities in electrolytes.  Treating the underlying cause of dehydration. Follow these instructions at home:  If directed by your health care provider, drink an ORS:  Make an ORS by following instructions on the  package.  Start by drinking small amounts, about  cup (120 mL) every 5-10 minutes.  Slowly increase how much you drink until you have taken the amount recommended by your health care provider.  Drink enough clear fluid to keep your urine clear or pale yellow. If you were told to drink an ORS, finish the ORS first, then start slowly drinking other clear fluids. Drink fluids such as:  Water. Do not drink only water. Doing that can lead to having too little salt (sodium) in the body (hyponatremia).  Ice chips.  Fruit juice that you have added water to (diluted fruit juice).  Low-calorie sports drinks.  Avoid:  Alcohol.  Drinks that contain a lot of sugar. These include high-calorie sports drinks, fruit juice that is not diluted, and soda.  Caffeine.  Foods that are greasy or contain a lot of fat or sugar.  Take over-the-counter and prescription medicines only as told by your health care provider.  Do not take sodium tablets. This can lead to having too much sodium in the body (hypernatremia).  Eat foods that contain a healthy balance of electrolytes, such as bananas, oranges, potatoes, tomatoes, and spinach.  Keep all follow-up visits as told by your health care provider. This is important. Contact a health care provider if:  You have abdominal pain that:  Gets worse.  Stays in one area (localizes).  You have a rash.  You have a stiff neck.  You are more irritable than usual.  You are sleepier or more difficult to wake up than usual.  You feel weak or dizzy.  You feel very thirsty.  You have urinated only a small amount of very dark urine over 6-8 hours. Get help right away if:  You have symptoms of severe dehydration.  You cannot drink fluids without vomiting.  Your symptoms get worse with treatment.  You have a fever.  You have a severe headache.  You have vomiting or diarrhea that:  Gets worse.  Does not go away.  You have blood or green matter  (bile) in your vomit.  You have blood in your stool. This may cause stool to look black and tarry.  You have not urinated in 6-8 hours.  You faint.  Your heart rate while sitting still is over 100 beats a minute.  You have trouble breathing. This information is not intended to replace advice given to you by your health care provider. Make sure you discuss any questions you have with your health care provider. Document Released: 12/18/2004 Document Revised: 07/15/2015 Document Reviewed: 02/11/2015 Elsevier Interactive Patient Education  2017 [employer]  

## 2016-04-06 NOTE — Telephone Encounter (Signed)
Time cor 4/9 IVF's adjusted. Patient given new schedule in infusion.

## 2016-04-07 ENCOUNTER — Ambulatory Visit (HOSPITAL_BASED_OUTPATIENT_CLINIC_OR_DEPARTMENT_OTHER): Payer: 59

## 2016-04-07 VITALS — BP 123/65 | HR 99 | Temp 99.0°F | Resp 16

## 2016-04-07 DIAGNOSIS — C023 Malignant neoplasm of anterior two-thirds of tongue, part unspecified: Secondary | ICD-10-CM

## 2016-04-07 MED ORDER — SODIUM CHLORIDE 0.9% FLUSH
10.0000 mL | INTRAVENOUS | Status: DC | PRN
  Administered 2016-04-07: 10 mL
  Filled 2016-04-07: qty 10

## 2016-04-07 MED ORDER — HEPARIN SOD (PORK) LOCK FLUSH 100 UNIT/ML IV SOLN
500.0000 [IU] | Freq: Once | INTRAVENOUS | Status: AC | PRN
  Administered 2016-04-07: 500 [IU]
  Filled 2016-04-07: qty 5

## 2016-04-07 MED ORDER — SODIUM CHLORIDE 0.9 % IV SOLN
Freq: Once | INTRAVENOUS | Status: AC
  Administered 2016-04-07: 10:00:00 via INTRAVENOUS

## 2016-04-07 NOTE — Patient Instructions (Signed)
Dehydration, Adult Dehydration is a condition in which there is not enough fluid or water in the body. This happens when you lose more fluids than you take in. Important organs, such as the kidneys, brain, and heart, cannot function without a proper amount of fluids. Any loss of fluids from the body can lead to dehydration. Dehydration can range from mild to severe. This condition should be treated right away to prevent it from becoming severe. What are the causes? This condition may be caused by:  Vomiting.  Diarrhea.  Excessive sweating, such as from heat exposure or exercise.  Not drinking enough fluid, especially:  When ill.  While doing activity that requires a lot of energy.  Excessive urination.  Fever.  Infection.  Certain medicines, such as medicines that cause the body to lose excess fluid (diuretics).  Inability to access safe drinking water.  Reduced physical ability to get adequate water and food. What increases the risk? This condition is more likely to develop in people:  Who have a poorly controlled long-term (chronic) illness, such as diabetes, heart disease, or kidney disease.  Who are age 65 or older.  Who are disabled.  Who live in a place with high altitude.  Who play endurance sports. What are the signs or symptoms? Symptoms of mild dehydration may include:   Thirst.  Dry lips.  Slightly dry mouth.  Dry, warm skin.  Dizziness. Symptoms of moderate dehydration may include:   Very dry mouth.  Muscle cramps.  Dark urine. Urine may be the color of tea.  Decreased urine production.  Decreased tear production.  Heartbeat that is irregular or faster than normal (palpitations).  Headache.  Light-headedness, especially when you stand up from a sitting position.  Fainting (syncope). Symptoms of severe dehydration may include:   Changes in skin, such as:  Cold and clammy skin.  Blotchy (mottled) or pale skin.  Skin that does  not quickly return to normal after being lightly pinched and released (poor skin turgor).  Changes in body fluids, such as:  Extreme thirst.  No tear production.  Inability to sweat when body temperature is high, such as in hot weather.  Very little urine production.  Changes in vital signs, such as:  Weak pulse.  Pulse that is more than 100 beats a minute when sitting still.  Rapid breathing.  Low blood pressure.  Other changes, such as:  Sunken eyes.  Cold hands and feet.  Confusion.  Lack of energy (lethargy).  Difficulty waking up from sleep.  Short-term weight loss.  Unconsciousness. How is this diagnosed? This condition is diagnosed based on your symptoms and a physical exam. Blood and urine tests may be done to help confirm the diagnosis. How is this treated? Treatment for this condition depends on the severity. Mild or moderate dehydration can often be treated at home. Treatment should be started right away. Do not wait until dehydration becomes severe. Severe dehydration is an emergency and it needs to be treated in a hospital. Treatment for mild dehydration may include:   Drinking more fluids.  Replacing salts and minerals in your blood (electrolytes) that you may have lost. Treatment for moderate dehydration may include:   Drinking an oral rehydration solution (ORS). This is a drink that helps you replace fluids and electrolytes (rehydrate). It can be found at pharmacies and retail stores. Treatment for severe dehydration may include:   Receiving fluids through an IV tube.  Receiving an electrolyte solution through a feeding tube that is   passed through your nose and into your stomach (nasogastric tube, or NG tube).  Correcting any abnormalities in electrolytes.  Treating the underlying cause of dehydration. Follow these instructions at home:  If directed by your health care provider, drink an ORS:  Make an ORS by following instructions on the  package.  Start by drinking small amounts, about  cup (120 mL) every 5-10 minutes.  Slowly increase how much you drink until you have taken the amount recommended by your health care provider.  Drink enough clear fluid to keep your urine clear or pale yellow. If you were told to drink an ORS, finish the ORS first, then start slowly drinking other clear fluids. Drink fluids such as:  Water. Do not drink only water. Doing that can lead to having too little salt (sodium) in the body (hyponatremia).  Ice chips.  Fruit juice that you have added water to (diluted fruit juice).  Low-calorie sports drinks.  Avoid:  Alcohol.  Drinks that contain a lot of sugar. These include high-calorie sports drinks, fruit juice that is not diluted, and soda.  Caffeine.  Foods that are greasy or contain a lot of fat or sugar.  Take over-the-counter and prescription medicines only as told by your health care provider.  Do not take sodium tablets. This can lead to having too much sodium in the body (hypernatremia).  Eat foods that contain a healthy balance of electrolytes, such as bananas, oranges, potatoes, tomatoes, and spinach.  Keep all follow-up visits as told by your health care provider. This is important. Contact a health care provider if:  You have abdominal pain that:  Gets worse.  Stays in one area (localizes).  You have a rash.  You have a stiff neck.  You are more irritable than usual.  You are sleepier or more difficult to wake up than usual.  You feel weak or dizzy.  You feel very thirsty.  You have urinated only a small amount of very dark urine over 6-8 hours. Get help right away if:  You have symptoms of severe dehydration.  You cannot drink fluids without vomiting.  Your symptoms get worse with treatment.  You have a fever.  You have a severe headache.  You have vomiting or diarrhea that:  Gets worse.  Does not go away.  You have blood or green matter  (bile) in your vomit.  You have blood in your stool. This may cause stool to look black and tarry.  You have not urinated in 6-8 hours.  You faint.  Your heart rate while sitting still is over 100 beats a minute.  You have trouble breathing. This information is not intended to replace advice given to you by your health care provider. Make sure you discuss any questions you have with your health care provider. Document Released: 12/18/2004 Document Revised: 07/15/2015 Document Reviewed: 02/11/2015 Elsevier Interactive Patient Education  2017 [employer]  

## 2016-04-08 ENCOUNTER — Telehealth: Payer: 59 | Admitting: Family

## 2016-04-08 DIAGNOSIS — B029 Zoster without complications: Secondary | ICD-10-CM

## 2016-04-08 MED ORDER — GABAPENTIN 300 MG PO CAPS: 300.0000 mg | ORAL_CAPSULE | Freq: Two times a day (BID) | ORAL | 0 refills | Status: DC | PRN

## 2016-04-08 MED ORDER — LIDOCAINE 5 % EX OINT: 1.0000 "application " | TOPICAL_OINTMENT | CUTANEOUS | 0 refills | Status: DC | PRN

## 2016-04-08 MED ORDER — VALACYCLOVIR HCL 1 G PO TABS: 1000.0000 mg | ORAL_TABLET | Freq: Two times a day (BID) | ORAL | 0 refills | Status: DC

## 2016-04-08 NOTE — Progress Notes (Signed)
E-visit for Shingles   We are sorry that you are not feeling well. Here is how we plan to help!  Based on what you shared with me it looks like you have shingles.  Shingles or herpes zoster, is a common infection of the nerves.  It is a painful rash caused by the herpes zoster virus.  This is the same virus that causes chickenpox.  After a person has chickenpox, the virus remains inactive in the nerve cells.  Years later, the virus can become active again and travel to the skin.  It typically will appear on one side of the face or body.  Burning or shooting pain, tingling, or itching are early signs of the infection.  Blisters typically scab over in 7 to 10 days and clear up within 2-4 weeks. Shingles is only contagious to people that have never had the chickenpox, the chickenpox vaccine, or anyone who has a compromised immune system.  You should avoid contact with these type of people until your blisters scab over.  Due to your renal function, I have made some adjustments to our normal prescribing for safety reasons. I have prescribed Valacyclovir 1g every 12 hours daily for 7 days and also Gabapentin 300mg  twice daily as needed for pain  Given your unique situation with the rash near the feeding tube, I have also prescribed Lidocaine 5% ointment. This would be specifically around the feeding tube due to excessive irritation. I would not recommend all over as the calamine is great at drying up blisters and that should be a priority. Make sure you wear gloves when using the calamine and lidocaine. The other medicines above will work from the inside out but I'm concerned about the feeding tube area hurting severely. Please be careful not to get it too close to the opening in your skin. Even if you are about 1/2 inch away, this is OK as the lidocaine will penetrate into the nerves in the skin and still help the area where the tube comes out.    HOME CARE: . Apply ice packs (wrapped in a thin towel), cool  compresses, or soak in cool bath to help reduce pain. . Use calamine lotion to calm itchy skin. . Avoid scratching the rash. . Avoid direct sunlight.  GET HELP RIGHT AWAY IF: . Symptoms that don't away after treatment. . A rash or blisters near your eye. . Increased drainage, fever, or rash after treatment. . Severe pain that doesn't go away.   MAKE SURE YOU    Understand these instructions.  Will watch your condition.  Will get help right away if you are not doing well or get worse.  Thank you for choosing an e-visit. Your e-visit answers were reviewed by a board certified advanced clinical practitioner to complete your personal care plan. Depending upon the condition, your plan could have included both over the counter or prescription medications.  Please review your pharmacy choice. Make sure the pharmacy is open so you can pick up prescription now. If there is a problem, you may contact your provider through [employer] and have the prescription routed to another pharmacy.  Your safety is important to [place]. If you have drug allergies check your prescription carefully.   For the next 24 hours you can use MyChart to ask questions about today's visit, request a non-urgent call back, or ask for a work or school excuse.  You will get an email in the next two days asking about your experience. I  hope that your e-visit has been valuable and will speed your recovery

## 2016-04-09 ENCOUNTER — Ambulatory Visit
Admission: RE | Admit: 2016-04-09 | Discharge: 2016-04-09 | Disposition: A | Discharge status: Home / Self Care | Payer: 59 | Source: Ambulatory Visit | Attending: Radiation Oncology | Admitting: Radiation Oncology

## 2016-04-09 ENCOUNTER — Ambulatory Visit (HOSPITAL_BASED_OUTPATIENT_CLINIC_OR_DEPARTMENT_OTHER): Payer: 59 | Admitting: Nurse Practitioner

## 2016-04-09 VITALS — BP 130/62 | HR 93 | Temp 98.7°F | Resp 18 | Ht 67.0 in | Wt 211.6 lb

## 2016-04-09 DIAGNOSIS — E860 Dehydration: Secondary | ICD-10-CM | POA: Diagnosis not present

## 2016-04-09 DIAGNOSIS — C023 Malignant neoplasm of anterior two-thirds of tongue, part unspecified: Secondary | ICD-10-CM

## 2016-04-09 DIAGNOSIS — Z510 Encounter for antineoplastic radiation therapy: Secondary | ICD-10-CM | POA: Diagnosis not present

## 2016-04-09 MED ORDER — HEPARIN SOD (PORK) LOCK FLUSH 100 UNIT/ML IV SOLN
500.0000 [IU] | Freq: Once | INTRAVENOUS | Status: AC | PRN
  Administered 2016-04-09: 500 [IU]
  Filled 2016-04-09: qty 5

## 2016-04-09 MED ORDER — GABAPENTIN 300 MG PO CAPS: 300.0000 mg | ORAL_CAPSULE | Freq: Two times a day (BID) | ORAL | 0 refills | Status: DC | PRN

## 2016-04-09 MED ORDER — SODIUM CHLORIDE 0.9 % IV SOLN
Freq: Once | INTRAVENOUS | Status: AC
  Administered 2016-04-09: 11:00:00 via INTRAVENOUS

## 2016-04-09 MED ORDER — BIAFINE EX EMUL
CUTANEOUS | Status: DC | PRN
  Administered 2016-04-09: 11:00:00 via TOPICAL

## 2016-04-09 MED ORDER — ACYCLOVIR 200 MG/5ML PO SUSP: 800.0000 mg | Freq: Three times a day (TID) | ORAL | 0 refills | Status: AC

## 2016-04-09 MED ORDER — LIDOCAINE 5 % EX OINT: 1.0000 "application " | TOPICAL_OINTMENT | CUTANEOUS | 0 refills | Status: DC | PRN

## 2016-04-09 MED ORDER — SODIUM CHLORIDE 0.9% FLUSH
10.0000 mL | INTRAVENOUS | Status: DC | PRN
  Administered 2016-04-09: 10 mL
  Filled 2016-04-09: qty 10

## 2016-04-09 MED ORDER — VALACYCLOVIR HCL 1 G PO TABS: 1000.0000 mg | ORAL_TABLET | Freq: Two times a day (BID) | ORAL | 0 refills | Status: DC

## 2016-04-09 NOTE — Progress Notes (Signed)
RN visit for IV fluids. 

## 2016-04-09 NOTE — Patient Instructions (Signed)
Dehydration, Adult Dehydration is a condition in which there is not enough fluid or water in the body. This happens when you lose more fluids than you take in. Important organs, such as the kidneys, brain, and heart, cannot function without a proper amount of fluids. Any loss of fluids from the body can lead to dehydration. Dehydration can range from mild to severe. This condition should be treated right away to prevent it from becoming severe. What are the causes? This condition may be caused by:  Vomiting.  Diarrhea.  Excessive sweating, such as from heat exposure or exercise.  Not drinking enough fluid, especially:  When ill.  While doing activity that requires a lot of energy.  Excessive urination.  Fever.  Infection.  Certain medicines, such as medicines that cause the body to lose excess fluid (diuretics).  Inability to access safe drinking water.  Reduced physical ability to get adequate water and food. What increases the risk? This condition is more likely to develop in people:  Who have a poorly controlled long-term (chronic) illness, such as diabetes, heart disease, or kidney disease.  Who are age 65 or older.  Who are disabled.  Who live in a place with high altitude.  Who play endurance sports. What are the signs or symptoms? Symptoms of mild dehydration may include:   Thirst.  Dry lips.  Slightly dry mouth.  Dry, warm skin.  Dizziness. Symptoms of moderate dehydration may include:   Very dry mouth.  Muscle cramps.  Dark urine. Urine may be the color of tea.  Decreased urine production.  Decreased tear production.  Heartbeat that is irregular or faster than normal (palpitations).  Headache.  Light-headedness, especially when you stand up from a sitting position.  Fainting (syncope). Symptoms of severe dehydration may include:   Changes in skin, such as:  Cold and clammy skin.  Blotchy (mottled) or pale skin.  Skin that does  not quickly return to normal after being lightly pinched and released (poor skin turgor).  Changes in body fluids, such as:  Extreme thirst.  No tear production.  Inability to sweat when body temperature is high, such as in hot weather.  Very little urine production.  Changes in vital signs, such as:  Weak pulse.  Pulse that is more than 100 beats a minute when sitting still.  Rapid breathing.  Low blood pressure.  Other changes, such as:  Sunken eyes.  Cold hands and feet.  Confusion.  Lack of energy (lethargy).  Difficulty waking up from sleep.  Short-term weight loss.  Unconsciousness. How is this diagnosed? This condition is diagnosed based on your symptoms and a physical exam. Blood and urine tests may be done to help confirm the diagnosis. How is this treated? Treatment for this condition depends on the severity. Mild or moderate dehydration can often be treated at home. Treatment should be started right away. Do not wait until dehydration becomes severe. Severe dehydration is an emergency and it needs to be treated in a hospital. Treatment for mild dehydration may include:   Drinking more fluids.  Replacing salts and minerals in your blood (electrolytes) that you may have lost. Treatment for moderate dehydration may include:   Drinking an oral rehydration solution (ORS). This is a drink that helps you replace fluids and electrolytes (rehydrate). It can be found at pharmacies and retail stores. Treatment for severe dehydration may include:   Receiving fluids through an IV tube.  Receiving an electrolyte solution through a feeding tube that is   passed through your nose and into your stomach (nasogastric tube, or NG tube).  Correcting any abnormalities in electrolytes.  Treating the underlying cause of dehydration. Follow these instructions at home:  If directed by your health care provider, drink an ORS:  Make an ORS by following instructions on the  package.  Start by drinking small amounts, about  cup (120 mL) every 5-10 minutes.  Slowly increase how much you drink until you have taken the amount recommended by your health care provider.  Drink enough clear fluid to keep your urine clear or pale yellow. If you were told to drink an ORS, finish the ORS first, then start slowly drinking other clear fluids. Drink fluids such as:  Water. Do not drink only water. Doing that can lead to having too little salt (sodium) in the body (hyponatremia).  Ice chips.  Fruit juice that you have added water to (diluted fruit juice).  Low-calorie sports drinks.  Avoid:  Alcohol.  Drinks that contain a lot of sugar. These include high-calorie sports drinks, fruit juice that is not diluted, and soda.  Caffeine.  Foods that are greasy or contain a lot of fat or sugar.  Take over-the-counter and prescription medicines only as told by your health care provider.  Do not take sodium tablets. This can lead to having too much sodium in the body (hypernatremia).  Eat foods that contain a healthy balance of electrolytes, such as bananas, oranges, potatoes, tomatoes, and spinach.  Keep all follow-up visits as told by your health care provider. This is important. Contact a health care provider if:  You have abdominal pain that:  Gets worse.  Stays in one area (localizes).  You have a rash.  You have a stiff neck.  You are more irritable than usual.  You are sleepier or more difficult to wake up than usual.  You feel weak or dizzy.  You feel very thirsty.  You have urinated only a small amount of very dark urine over 6-8 hours. Get help right away if:  You have symptoms of severe dehydration.  You cannot drink fluids without vomiting.  Your symptoms get worse with treatment.  You have a fever.  You have a severe headache.  You have vomiting or diarrhea that:  Gets worse.  Does not go away.  You have blood or green matter  (bile) in your vomit.  You have blood in your stool. This may cause stool to look black and tarry.  You have not urinated in 6-8 hours.  You faint.  Your heart rate while sitting still is over 100 beats a minute.  You have trouble breathing. This information is not intended to replace advice given to you by your health care provider. Make sure you discuss any questions you have with your health care provider. Document Released: 12/18/2004 Document Revised: 07/15/2015 Document Reviewed: 02/11/2015 Elsevier Interactive Patient Education  2017 [employer]  

## 2016-04-09 NOTE — Addendum Note (Signed)
Addended by: Benjamine Mola on: 04/09/2016 12:25 PM   Modules accepted: Orders

## 2016-04-09 NOTE — Addendum Note (Signed)
Addended by: Benjamine Mola on: 04/09/2016 09:47 AM   Modules accepted: Orders

## 2016-04-10 ENCOUNTER — Ambulatory Visit
Admission: RE | Admit: 2016-04-10 | Discharge: 2016-04-10 | Disposition: A | Discharge status: Home / Self Care | Payer: 59 | Source: Ambulatory Visit | Attending: Radiation Oncology | Admitting: Radiation Oncology

## 2016-04-10 ENCOUNTER — Ambulatory Visit (HOSPITAL_BASED_OUTPATIENT_CLINIC_OR_DEPARTMENT_OTHER): Payer: 59 | Admitting: Nurse Practitioner

## 2016-04-10 VITALS — BP 115/66 | HR 85 | Temp 98.7°F | Resp 18 | Ht 67.0 in | Wt 211.3 lb

## 2016-04-10 DIAGNOSIS — N179 Acute kidney failure, unspecified: Secondary | ICD-10-CM

## 2016-04-10 DIAGNOSIS — E860 Dehydration: Secondary | ICD-10-CM | POA: Diagnosis not present

## 2016-04-10 DIAGNOSIS — C023 Malignant neoplasm of anterior two-thirds of tongue, part unspecified: Secondary | ICD-10-CM | POA: Diagnosis not present

## 2016-04-10 DIAGNOSIS — Z510 Encounter for antineoplastic radiation therapy: Secondary | ICD-10-CM | POA: Diagnosis not present

## 2016-04-10 MED ORDER — SODIUM CHLORIDE 0.9% FLUSH
10.0000 mL | INTRAVENOUS | Status: DC | PRN
  Administered 2016-04-10: 10 mL
  Filled 2016-04-10: qty 10

## 2016-04-10 MED ORDER — SODIUM CHLORIDE 0.9 % IV SOLN
Freq: Once | INTRAVENOUS | Status: AC
  Administered 2016-04-10: 11:00:00 via INTRAVENOUS

## 2016-04-10 MED ORDER — HEPARIN SOD (PORK) LOCK FLUSH 100 UNIT/ML IV SOLN
500.0000 [IU] | Freq: Once | INTRAVENOUS | Status: AC | PRN
  Administered 2016-04-10: 500 [IU]
  Filled 2016-04-10: qty 5

## 2016-04-10 NOTE — Progress Notes (Signed)
RN visit for IV fluids. 

## 2016-04-10 NOTE — Patient Instructions (Signed)
Dehydration, Adult Dehydration is a condition in which there is not enough fluid or water in the body. This happens when you lose more fluids than you take in. Important organs, such as the kidneys, brain, and heart, cannot function without a proper amount of fluids. Any loss of fluids from the body can lead to dehydration. Dehydration can range from mild to severe. This condition should be treated right away to prevent it from becoming severe. What are the causes? This condition may be caused by:  Vomiting.  Diarrhea.  Excessive sweating, such as from heat exposure or exercise.  Not drinking enough fluid, especially:  When ill.  While doing activity that requires a lot of energy.  Excessive urination.  Fever.  Infection.  Certain medicines, such as medicines that cause the body to lose excess fluid (diuretics).  Inability to access safe drinking water.  Reduced physical ability to get adequate water and food. What increases the risk? This condition is more likely to develop in people:  Who have a poorly controlled long-term (chronic) illness, such as diabetes, heart disease, or kidney disease.  Who are age 65 or older.  Who are disabled.  Who live in a place with high altitude.  Who play endurance sports. What are the signs or symptoms? Symptoms of mild dehydration may include:   Thirst.  Dry lips.  Slightly dry mouth.  Dry, warm skin.  Dizziness. Symptoms of moderate dehydration may include:   Very dry mouth.  Muscle cramps.  Dark urine. Urine may be the color of tea.  Decreased urine production.  Decreased tear production.  Heartbeat that is irregular or faster than normal (palpitations).  Headache.  Light-headedness, especially when you stand up from a sitting position.  Fainting (syncope). Symptoms of severe dehydration may include:   Changes in skin, such as:  Cold and clammy skin.  Blotchy (mottled) or pale skin.  Skin that does  not quickly return to normal after being lightly pinched and released (poor skin turgor).  Changes in body fluids, such as:  Extreme thirst.  No tear production.  Inability to sweat when body temperature is high, such as in hot weather.  Very little urine production.  Changes in vital signs, such as:  Weak pulse.  Pulse that is more than 100 beats a minute when sitting still.  Rapid breathing.  Low blood pressure.  Other changes, such as:  Sunken eyes.  Cold hands and feet.  Confusion.  Lack of energy (lethargy).  Difficulty waking up from sleep.  Short-term weight loss.  Unconsciousness. How is this diagnosed? This condition is diagnosed based on your symptoms and a physical exam. Blood and urine tests may be done to help confirm the diagnosis. How is this treated? Treatment for this condition depends on the severity. Mild or moderate dehydration can often be treated at home. Treatment should be started right away. Do not wait until dehydration becomes severe. Severe dehydration is an emergency and it needs to be treated in a hospital. Treatment for mild dehydration may include:   Drinking more fluids.  Replacing salts and minerals in your blood (electrolytes) that you may have lost. Treatment for moderate dehydration may include:   Drinking an oral rehydration solution (ORS). This is a drink that helps you replace fluids and electrolytes (rehydrate). It can be found at pharmacies and retail stores. Treatment for severe dehydration may include:   Receiving fluids through an IV tube.  Receiving an electrolyte solution through a feeding tube that is   passed through your nose and into your stomach (nasogastric tube, or NG tube).  Correcting any abnormalities in electrolytes.  Treating the underlying cause of dehydration. Follow these instructions at home:  If directed by your health care provider, drink an ORS:  Make an ORS by following instructions on the  package.  Start by drinking small amounts, about  cup (120 mL) every 5-10 minutes.  Slowly increase how much you drink until you have taken the amount recommended by your health care provider.  Drink enough clear fluid to keep your urine clear or pale yellow. If you were told to drink an ORS, finish the ORS first, then start slowly drinking other clear fluids. Drink fluids such as:  Water. Do not drink only water. Doing that can lead to having too little salt (sodium) in the body (hyponatremia).  Ice chips.  Fruit juice that you have added water to (diluted fruit juice).  Low-calorie sports drinks.  Avoid:  Alcohol.  Drinks that contain a lot of sugar. These include high-calorie sports drinks, fruit juice that is not diluted, and soda.  Caffeine.  Foods that are greasy or contain a lot of fat or sugar.  Take over-the-counter and prescription medicines only as told by your health care provider.  Do not take sodium tablets. This can lead to having too much sodium in the body (hypernatremia).  Eat foods that contain a healthy balance of electrolytes, such as bananas, oranges, potatoes, tomatoes, and spinach.  Keep all follow-up visits as told by your health care provider. This is important. Contact a health care provider if:  You have abdominal pain that:  Gets worse.  Stays in one area (localizes).  You have a rash.  You have a stiff neck.  You are more irritable than usual.  You are sleepier or more difficult to wake up than usual.  You feel weak or dizzy.  You feel very thirsty.  You have urinated only a small amount of very dark urine over 6-8 hours. Get help right away if:  You have symptoms of severe dehydration.  You cannot drink fluids without vomiting.  Your symptoms get worse with treatment.  You have a fever.  You have a severe headache.  You have vomiting or diarrhea that:  Gets worse.  Does not go away.  You have blood or green matter  (bile) in your vomit.  You have blood in your stool. This may cause stool to look black and tarry.  You have not urinated in 6-8 hours.  You faint.  Your heart rate while sitting still is over 100 beats a minute.  You have trouble breathing. This information is not intended to replace advice given to you by your health care provider. Make sure you discuss any questions you have with your health care provider. Document Released: 12/18/2004 Document Revised: 07/15/2015 Document Reviewed: 02/11/2015 Elsevier Interactive Patient Education  2017 [employer]  

## 2016-04-11 ENCOUNTER — Other Ambulatory Visit: Payer: Self-pay

## 2016-04-11 ENCOUNTER — Ambulatory Visit
Admission: RE | Admit: 2016-04-11 | Discharge: 2016-04-11 | Disposition: A | Discharge status: Home / Self Care | Payer: 59 | Source: Ambulatory Visit | Attending: Radiation Oncology | Admitting: Radiation Oncology

## 2016-04-11 ENCOUNTER — Ambulatory Visit (HOSPITAL_BASED_OUTPATIENT_CLINIC_OR_DEPARTMENT_OTHER): Payer: 59 | Admitting: Nurse Practitioner

## 2016-04-11 ENCOUNTER — Other Ambulatory Visit (HOSPITAL_BASED_OUTPATIENT_CLINIC_OR_DEPARTMENT_OTHER): Payer: 59

## 2016-04-11 ENCOUNTER — Ambulatory Visit: Payer: 59

## 2016-04-11 VITALS — BP 129/72 | HR 100 | Temp 98.4°F | Resp 18 | Ht 67.0 in | Wt 213.2 lb

## 2016-04-11 DIAGNOSIS — N179 Acute kidney failure, unspecified: Secondary | ICD-10-CM | POA: Diagnosis not present

## 2016-04-11 DIAGNOSIS — C023 Malignant neoplasm of anterior two-thirds of tongue, part unspecified: Secondary | ICD-10-CM

## 2016-04-11 DIAGNOSIS — Z510 Encounter for antineoplastic radiation therapy: Secondary | ICD-10-CM | POA: Diagnosis not present

## 2016-04-11 DIAGNOSIS — E860 Dehydration: Secondary | ICD-10-CM

## 2016-04-11 LAB — MAGNESIUM: MAGNESIUM: 1.7 mg/dL (ref 1.5–2.5)

## 2016-04-11 LAB — CBC WITH DIFFERENTIAL/PLATELET
BASO%: 0.7 % (ref 0.0–2.0)
Basophils Absolute: 0 10*3/uL (ref 0.0–0.1)
EOS%: 5.5 % (ref 0.0–7.0)
Eosinophils Absolute: 0.2 10*3/uL (ref 0.0–0.5)
HCT: 27.7 % — ABNORMAL LOW (ref 34.8–46.6)
HGB: 10.1 g/dL — ABNORMAL LOW (ref 11.6–15.9)
LYMPH%: 19 % (ref 14.0–49.7)
MCH: 28.7 pg (ref 25.1–34.0)
MCHC: 36.5 g/dL — AB (ref 31.5–36.0)
MCV: 78.7 fL — ABNORMAL LOW (ref 79.5–101.0)
MONO#: 0.3 10*3/uL (ref 0.1–0.9)
MONO%: 11 % (ref 0.0–14.0)
NEUT#: 1.9 10*3/uL (ref 1.5–6.5)
NEUT%: 63.8 % (ref 38.4–76.8)
NRBC: 0 % (ref 0–0)
Platelets: 123 10*3/uL — ABNORMAL LOW (ref 145–400)
RBC: 3.52 10*6/uL — AB (ref 3.70–5.45)
RDW: 16.7 % — ABNORMAL HIGH (ref 11.2–14.5)
WBC: 2.9 10*3/uL — ABNORMAL LOW (ref 3.9–10.3)
lymph#: 0.6 10*3/uL — ABNORMAL LOW (ref 0.9–3.3)

## 2016-04-11 LAB — COMPREHENSIVE METABOLIC PANEL
ALBUMIN: 3.3 g/dL — AB (ref 3.5–5.0)
ALK PHOS: 116 U/L (ref 40–150)
ALT: 22 U/L (ref 0–55)
AST: 35 U/L — AB (ref 5–34)
Anion Gap: 10 mEq/L (ref 3–11)
BILIRUBIN TOTAL: 0.32 mg/dL (ref 0.20–1.20)
BUN: 12.1 mg/dL (ref 7.0–26.0)
CALCIUM: 8.9 mg/dL (ref 8.4–10.4)
CO2: 23 mEq/L (ref 22–29)
CREATININE: 1.5 mg/dL — AB (ref 0.6–1.1)
Chloride: 104 mEq/L (ref 98–109)
EGFR: 41 mL/min/{1.73_m2} — ABNORMAL LOW (ref >90)
Glucose: 122 mg/dl (ref 70–140)
POTASSIUM: 4 meq/L (ref 3.5–5.1)
Sodium: 137 mEq/L (ref 136–145)
Total Protein: 6.7 g/dL (ref 6.4–8.3)

## 2016-04-11 LAB — TECHNOLOGIST REVIEW

## 2016-04-11 MED ORDER — HEPARIN SOD (PORK) LOCK FLUSH 100 UNIT/ML IV SOLN
500.0000 [IU] | Freq: Once | INTRAVENOUS | Status: AC | PRN
  Administered 2016-04-11: 500 [IU]
  Filled 2016-04-11: qty 5

## 2016-04-11 MED ORDER — SODIUM CHLORIDE 0.9% FLUSH
10.0000 mL | INTRAVENOUS | Status: DC | PRN
  Administered 2016-04-11: 10 mL
  Filled 2016-04-11: qty 10

## 2016-04-11 MED ORDER — SODIUM CHLORIDE 0.9% FLUSH
10.0000 mL | Freq: Once | INTRAVENOUS | Status: AC
  Administered 2016-04-11: 10 mL
  Filled 2016-04-11: qty 10

## 2016-04-11 MED ORDER — SODIUM CHLORIDE 0.9 % IV SOLN
Freq: Once | INTRAVENOUS | Status: AC
  Administered 2016-04-11: 10:00:00 via INTRAVENOUS

## 2016-04-11 MED ORDER — HEPARIN SOD (PORK) LOCK FLUSH 100 UNIT/ML IV SOLN
500.0000 [IU] | Freq: Once | INTRAVENOUS | Status: AC
  Administered 2016-04-11: 500 [IU]
  Filled 2016-04-11: qty 5

## 2016-04-11 NOTE — Progress Notes (Signed)
RN visit for IV fluids. 

## 2016-04-11 NOTE — Patient Instructions (Signed)
Dehydration, Adult Dehydration is a condition in which there is not enough fluid or water in the body. This happens when you lose more fluids than you take in. Important organs, such as the kidneys, brain, and heart, cannot function without a proper amount of fluids. Any loss of fluids from the body can lead to dehydration. Dehydration can range from mild to severe. This condition should be treated right away to prevent it from becoming severe. What are the causes? This condition may be caused by:  Vomiting.  Diarrhea.  Excessive sweating, such as from heat exposure or exercise.  Not drinking enough fluid, especially:  When ill.  While doing activity that requires a lot of energy.  Excessive urination.  Fever.  Infection.  Certain medicines, such as medicines that cause the body to lose excess fluid (diuretics).  Inability to access safe drinking water.  Reduced physical ability to get adequate water and food. What increases the risk? This condition is more likely to develop in people:  Who have a poorly controlled long-term (chronic) illness, such as diabetes, heart disease, or kidney disease.  Who are age 65 or older.  Who are disabled.  Who live in a place with high altitude.  Who play endurance sports. What are the signs or symptoms? Symptoms of mild dehydration may include:   Thirst.  Dry lips.  Slightly dry mouth.  Dry, warm skin.  Dizziness. Symptoms of moderate dehydration may include:   Very dry mouth.  Muscle cramps.  Dark urine. Urine may be the color of tea.  Decreased urine production.  Decreased tear production.  Heartbeat that is irregular or faster than normal (palpitations).  Headache.  Light-headedness, especially when you stand up from a sitting position.  Fainting (syncope). Symptoms of severe dehydration may include:   Changes in skin, such as:  Cold and clammy skin.  Blotchy (mottled) or pale skin.  Skin that does  not quickly return to normal after being lightly pinched and released (poor skin turgor).  Changes in body fluids, such as:  Extreme thirst.  No tear production.  Inability to sweat when body temperature is high, such as in hot weather.  Very little urine production.  Changes in vital signs, such as:  Weak pulse.  Pulse that is more than 100 beats a minute when sitting still.  Rapid breathing.  Low blood pressure.  Other changes, such as:  Sunken eyes.  Cold hands and feet.  Confusion.  Lack of energy (lethargy).  Difficulty waking up from sleep.  Short-term weight loss.  Unconsciousness. How is this diagnosed? This condition is diagnosed based on your symptoms and a physical exam. Blood and urine tests may be done to help confirm the diagnosis. How is this treated? Treatment for this condition depends on the severity. Mild or moderate dehydration can often be treated at home. Treatment should be started right away. Do not wait until dehydration becomes severe. Severe dehydration is an emergency and it needs to be treated in a hospital. Treatment for mild dehydration may include:   Drinking more fluids.  Replacing salts and minerals in your blood (electrolytes) that you may have lost. Treatment for moderate dehydration may include:   Drinking an oral rehydration solution (ORS). This is a drink that helps you replace fluids and electrolytes (rehydrate). It can be found at pharmacies and retail stores. Treatment for severe dehydration may include:   Receiving fluids through an IV tube.  Receiving an electrolyte solution through a feeding tube that is   passed through your nose and into your stomach (nasogastric tube, or NG tube).  Correcting any abnormalities in electrolytes.  Treating the underlying cause of dehydration. Follow these instructions at home:  If directed by your health care provider, drink an ORS:  Make an ORS by following instructions on the  package.  Start by drinking small amounts, about  cup (120 mL) every 5-10 minutes.  Slowly increase how much you drink until you have taken the amount recommended by your health care provider.  Drink enough clear fluid to keep your urine clear or pale yellow. If you were told to drink an ORS, finish the ORS first, then start slowly drinking other clear fluids. Drink fluids such as:  Water. Do not drink only water. Doing that can lead to having too little salt (sodium) in the body (hyponatremia).  Ice chips.  Fruit juice that you have added water to (diluted fruit juice).  Low-calorie sports drinks.  Avoid:  Alcohol.  Drinks that contain a lot of sugar. These include high-calorie sports drinks, fruit juice that is not diluted, and soda.  Caffeine.  Foods that are greasy or contain a lot of fat or sugar.  Take over-the-counter and prescription medicines only as told by your health care provider.  Do not take sodium tablets. This can lead to having too much sodium in the body (hypernatremia).  Eat foods that contain a healthy balance of electrolytes, such as bananas, oranges, potatoes, tomatoes, and spinach.  Keep all follow-up visits as told by your health care provider. This is important. Contact a health care provider if:  You have abdominal pain that:  Gets worse.  Stays in one area (localizes).  You have a rash.  You have a stiff neck.  You are more irritable than usual.  You are sleepier or more difficult to wake up than usual.  You feel weak or dizzy.  You feel very thirsty.  You have urinated only a small amount of very dark urine over 6-8 hours. Get help right away if:  You have symptoms of severe dehydration.  You cannot drink fluids without vomiting.  Your symptoms get worse with treatment.  You have a fever.  You have a severe headache.  You have vomiting or diarrhea that:  Gets worse.  Does not go away.  You have blood or green matter  (bile) in your vomit.  You have blood in your stool. This may cause stool to look black and tarry.  You have not urinated in 6-8 hours.  You faint.  Your heart rate while sitting still is over 100 beats a minute.  You have trouble breathing. This information is not intended to replace advice given to you by your health care provider. Make sure you discuss any questions you have with your health care provider. Document Released: 12/18/2004 Document Revised: 07/15/2015 Document Reviewed: 02/11/2015 Elsevier Interactive Patient Education  2017 [employer]  

## 2016-04-11 NOTE — Patient Instructions (Signed)
\[id number]\HWEXHBZ Line, Adult A central line is a thin, flexible tube (catheter) that is put in your vein. It can be used to:  Take blood for lab tests.  Give you medicine.  Give you food and nutrients. The procedure may vary among doctors and hospitals. Follow these instructions at home: Caring for the tube   Follow instructions from your doctor about:  Flushing the tube with saline solution.  Cleaning the tube and the area around it.  Only flush with clean (sterile) supplies. The supplies should be from your doctor, a pharmacy, or another place that your doctor recommends.  Before you flush the tube or clean the area around the tube:  Wash your hands with soap and water. If you cannot use soap and water, use hand sanitizer.  Clean the central line hub with rubbing alcohol. Caring for your skin   Keep the area where the tube was put in clean and dry.  Every day, and when changing the bandage, check the skin around the central line for:  Redness, swelling, or pain.  Fluid or blood.  Warmth.  Pus.  A bad smell. General instructions   Keep the tube clamped, unless it is being used.  Keep your supplies in a clean, dry location.  If you or someone else accidentally pulls on the tube, make sure:  The bandage (dressing) is okay.  There is no bleeding.  The tube has not been pulled out.  Do not use scissors or sharp objects near the tube.  Do not swim or let the tube soak in a tub.  Ask your doctor what activities are safe for you. Your doctor may tell you not to lift anything or move your arm too much.  Take over-the-counter and prescription medicines only as told by your doctor.  Change bandages as told by your doctor.  Keep your bandage dry. If a bandage gets wet, have it changed right away.  Keep all follow-up visits as told by your doctor. This is important. Throwing away supplies  Throw away any syringes in a  trash (disposal) container that is only for sharp items (sharps container). You can buy a sharps container from a pharmacy, or you can make one by using an empty hard plastic bottle with a cover.  Place any used bandages or infusion bags into a plastic bag. Throw that bag in the trash. Contact a doctor if:  You have any of these where the tube was put in:  Redness, swelling, or pain.  Fluid or blood.  A warm feeling.  Pus or a bad smell. Get help right away if:  You have:  A fever.  Chills.  Trouble getting enough air (shortness of breath).  Trouble breathing.  Pain in your chest.  Swelling in your neck, face, chest, or arm.  You are coughing.  You feel your heart beating fast or skipping beats.  You feel dizzy or you pass out (faint).  There are red lines coming from where the tube was put in.  The area where the tube was put in is bleeding and the bleeding will not stop.  Your tube is hard to flush.  You do not get a blood return from the tube.  The tube gets loose or comes out.  The tube has a hole or a tear.  The tube leaks. Summary  A central line is a thin, flexible tube (catheter) that is put in your vein. It can be used to take blood for lab tests  or to give you medicine.  Follow instructions from your doctor about flushing and cleaning the tube.  Keep the area where the tube was put in clean and dry.  Ask your doctor what activities are safe for you. This information is not intended to replace advice given to you by your health care provider. Make sure you discuss any questions you have with your health care provider. Document Released: 12/05/2011 Document Revised: 01/05/2016 Document Reviewed: 01/05/2016 Elsevier Interactive Patient Education  2017 [employer].

## 2016-04-12 ENCOUNTER — Encounter: Payer: Self-pay | Admitting: Hematology and Oncology

## 2016-04-12 ENCOUNTER — Ambulatory Visit
Admission: RE | Admit: 2016-04-12 | Discharge: 2016-04-12 | Disposition: A | Discharge status: Home / Self Care | Payer: 59 | Source: Ambulatory Visit | Attending: Radiation Oncology | Admitting: Radiation Oncology

## 2016-04-12 ENCOUNTER — Ambulatory Visit (HOSPITAL_BASED_OUTPATIENT_CLINIC_OR_DEPARTMENT_OTHER): Payer: 59 | Admitting: Nurse Practitioner

## 2016-04-12 ENCOUNTER — Encounter: Payer: Self-pay | Admitting: *Deleted

## 2016-04-12 ENCOUNTER — Ambulatory Visit (HOSPITAL_BASED_OUTPATIENT_CLINIC_OR_DEPARTMENT_OTHER): Payer: 59 | Admitting: Hematology and Oncology

## 2016-04-12 DIAGNOSIS — C023 Malignant neoplasm of anterior two-thirds of tongue, part unspecified: Secondary | ICD-10-CM | POA: Diagnosis not present

## 2016-04-12 DIAGNOSIS — G893 Neoplasm related pain (acute) (chronic): Secondary | ICD-10-CM

## 2016-04-12 DIAGNOSIS — N179 Acute kidney failure, unspecified: Secondary | ICD-10-CM | POA: Diagnosis not present

## 2016-04-12 DIAGNOSIS — D61818 Other pancytopenia: Secondary | ICD-10-CM | POA: Diagnosis not present

## 2016-04-12 DIAGNOSIS — Z510 Encounter for antineoplastic radiation therapy: Secondary | ICD-10-CM | POA: Diagnosis not present

## 2016-04-12 DIAGNOSIS — B029 Zoster without complications: Secondary | ICD-10-CM

## 2016-04-12 MED ORDER — HEPARIN SOD (PORK) LOCK FLUSH 100 UNIT/ML IV SOLN
500.0000 [IU] | Freq: Once | INTRAVENOUS | Status: AC | PRN
  Administered 2016-04-12: 500 [IU]
  Filled 2016-04-12: qty 5

## 2016-04-12 MED ORDER — SODIUM CHLORIDE 0.9% FLUSH
10.0000 mL | INTRAVENOUS | Status: DC | PRN
  Administered 2016-04-12: 10 mL
  Filled 2016-04-12: qty 10

## 2016-04-12 MED ORDER — SODIUM CHLORIDE 0.9 % IV SOLN
Freq: Once | INTRAVENOUS | Status: AC
  Administered 2016-04-12: 11:00:00 via INTRAVENOUS

## 2016-04-12 NOTE — Assessment & Plan Note (Signed)
This is likely due to side effects of treatment. She is not symptomatic. She does not need blood transfusion. We will continue dose modification as above

## 2016-04-12 NOTE — Assessment & Plan Note (Signed)
This is secondary to poor oral intake, dehydration and recent chemotherapy. With aggressive IV fluid resuscitation, her serum creatinine is improving. She will resume chemotherapy tomorrow with 50% dose adjustment

## 2016-04-12 NOTE — Assessment & Plan Note (Signed)
The patient has improvement of renal failure with aggressive IV fluid hydration daily.   I will resume chemotherapy this week with dose modification She will continue blood draw weekly I will continue to see her weekly for supportive care

## 2016-04-12 NOTE — Assessment & Plan Note (Signed)
She has recent shingles outbreak and is prescribed medication for this. The blisters are healing. I recommend placing her on isolation while receiving chemotherapy tomorrow

## 2016-04-12 NOTE — Progress Notes (Signed)
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 Tongue, biopsy, Anterior deep margin left SQUAMOUS CELL CARCINOMA 3. Tongue, biopsy, Posterior deep margin left NEGATIVE FOR CARCINOMA 4. Tongue, biopsy, Posterior margin left SQUAMOUS CELL CARCINOMA 5. Tongue, excisional biopsy, New anterior deep margin NEGATIVE FOR CARCINOMA 6. Tongue, excisional biopsy, New posterior deep margin SMALL FOCUS OF SQUAMOUS CELL CARCINOMA (0.1 CM, ONLY PRESENT AT THE DEEPER PERMINENT SECTION) 7. Tongue, resection for tumor, Left lateral INFILTRATIVE KERATINIZED SQUAMOUS CELL CARCINOMA (3.2 CM) THE TUMOR INVADES UNDERNEATH MUSCLE OF THE TONGUE (2.0 CM, PT2) LYMPHOVASCULAR AND PERINEURAL INVASION IDENTIFIED SQUAMOUS CELL CARCINOMA PRESENTED AT (FINAL MARGINS REFER TO PART 2-6) LATERAL INKED MARGIN (1.0 CM) MEDIAL INKED MARGIN (0.6 CM)      02/08/2016 Surgery    SURGICAL PROCEDURES: 1. Left hemiglossectomy with primary closure. 2. Left selective neck dissection (zones I-III).      03/02/2016 Imaging    CT chest with contrast: No evidence of metastatic disease in the chest. 2. Aortic atherosclerosis.       03/02/2016 Imaging    CT neck with contrast showed : Newly enlarged and round 10 mm right  level 1b lymph node (series 5, image 45). The level 1A nodes remain normal. This right 1 B node is nonspecific but suspicious for progressive nodal metastatic disease. It might be amenable to Ultrasound-guided FNA. 2. Interval left hemiglossectomy and selective left neck dissection with confluent indeterminate soft tissue from the anterior left submandibular space through the left carotid space and obscuring the left level 1B, level 2, and level IIIa nodal stations. This study will serve as a new postoperative baseline of this area. Attention directed on close interval follow-up. 3. Mild soft tissue thickening also along the left lateral tongue resection margin. Attention directed on followup. 4. Chest CT today reported separately      03/14/2016 Procedure    She has normal baseline hearing test      03/15/2016 Procedure    1. Successful placement of a right internal jugular approach power injectable Port-A-Cath. The Port a catheter is ready for immediate use. 2. Successful fluoroscopic insertion of a 20-French pull-through gastrostomy tube. The gastrostomy may be used immediately for medication administration and may be utilized in 24 hrs for the initiation of feeds.      03/15/2016 -  Radiation Therapy    She received concurrent radiation treatment      03/16/2016 -  Chemotherapy    She received weekly cisplatin       04/04/2016 Adverse Reaction    Chemotherapy is placed on hold due to acute renal failure       INTERVAL HISTORY: Please see below for problem oriented charting. The patient had recent shingles outbreak over the weekend. Overall, it is improving. It is affecting her right torso region, around the T5-6 distribution. She denies pain. She has  mucositis pain and has started to use liquid morphine as prescribed. Denies nausea, vomiting or constipation. She is using feeding tube regularly and has gained weight since the last time I saw her  REVIEW OF SYSTEMS:    Constitutional: Denies fevers, chills or abnormal weight loss Eyes: Denies blurriness of vision Respiratory: Denies cough, dyspnea or wheezes Cardiovascular: Denies palpitation, chest discomfort or lower extremity swelling Gastrointestinal:  Denies nausea, heartburn or change in bowel habits Lymphatics: Denies new lymphadenopathy or easy bruising Neurological:Denies numbness, tingling or new weaknesses Behavioral/Psych: Mood is stable, no new changes  All other systems were reviewed with the patient and are negative.  I have reviewed the past medical history, past surgical history, social history and family history with the patient and they are unchanged from previous note.  ALLERGIES:  has No Known Allergies.  MEDICATIONS:  Current Outpatient Prescriptions  Medication Sig Dispense Refill  . acyclovir (ZOVIRAX) 200 MG/5ML suspension Take 20 mLs (800 mg total) by mouth every 8 (eight) hours. 473 mL 0  . ALPRAZolam (XANAX) 0.5 MG tablet Take 0.5 mg by mouth at bedtime as needed for sleep.     Marland Kitchen atorvastatin (LIPITOR) 10 MG tablet Take 10 mg by mouth daily at 6 PM.     . buPROPion (ZYBAN) 150 MG 12 hr tablet Take 150 mg by mouth daily.     . chlorhexidine (PERIDEX) 0.12 % solution Rinse with 15 mls three times daily for 30 seconds. Use after breakfast, dinner, and at bedtime. Spit out excess. Do not swallow. 480 mL prn  . fentaNYL (DURAGESIC - DOSED MCG/HR) 25 MCG/HR patch Place 1 patch (25 mcg total) onto the skin every 3 (three) days. 5 patch 0  . fluconazole (DIFLUCAN) 100 MG tablet Take 2 tablets today, then 1 tablet daily x 20 more days. Hold Atorvastatin while on this drug. 22 tablet 0  . gabapentin (NEURONTIN) 300 MG capsule Take 1 capsule (300 mg total) by mouth 2 (two) times daily as needed. 28 capsule 0  . levothyroxine (SYNTHROID, LEVOTHROID) 75 MCG tablet Take 75 mcg by mouth daily before breakfast.     . lidocaine (XYLOCAINE) 2 % solution Patient: Mix 1part 2% viscous lidocaine,  1part H20. Swish and swallow 45m of this mixture, 332m before meals and at bedtime, up to QID 100 mL 5  . lidocaine (XYLOCAINE) 5 % ointment Apply 1 application topically as needed. Around feeding tube area for severe pain 50 g 0  . lidocaine-prilocaine (EMLA) cream Apply to affected area once 30 g 3  . Morphine Sulfate (MORPHINE CONCENTRATE) 10 mg / 0.5 ml concentrated solution Take 0.5 mLs (10 mg total) by mouth every 2 (two) hours as needed for severe pain. 120 mL 0  . Nutritional Supplements (FEEDING SUPPLEMENT, OSMOLITE 1.5 CAL,) LIQD Begin Osmolite 1.5 via PEG, 1 can QID with 60 mL free water before and after. Increase to goal rate of 2 cans BID and 1.5 cans BID via PEG. Give additional 240 mL free water TID between feedings. Please send formula and supplies. 1659 mL 0  . ondansetron (ZOFRAN) 8 MG tablet Take 1 tablet (8 mg total) by mouth 2 (two) times daily as needed. Start on the third day after chemotherapy. 30 tablet 1  . prochlorperazine (COMPAZINE) 10 MG tablet Take 1 tablet (10 mg total) by mouth every 6 (six) hours as needed (Nausea or vomiting). 30 tablet 1  . senna (SENOKOT) 8.6 MG tablet Take 1 tablet by mouth 2 (two) times daily.    .Marland Kitchen  triamcinolone (KENALOG) 0.1 % paste APPLY TO AFFECTED AREA    . zolpidem (AMBIEN) 10 MG tablet Take 10 mg by mouth at bedtime.     Marland Kitchen HYDROcodone-acetaminophen (HYCET) 7.5-325 mg/15 ml solution Take 10-15 mLs by mouth every 4 (four) hours as needed for moderate pain. (Patient not taking: Reported on 04/02/2016) 473 mL 0   No current facility-administered medications for this visit.    Facility-Administered Medications Ordered in Other Visits  Medication Dose Route Frequency Provider Last Rate Last Dose  . ondansetron (ZOFRAN) injection 8 mg  8 mg Intravenous Once [name], MD        PHYSICAL EXAMINATION: ECOG PERFORMANCE STATUS: 1 - Symptomatic but completely ambulatory  Vitals:   04/12/16 1031  BP: (!) 107/57  Pulse: 97  Resp: 18  Temp:  98.5 F (36.9 C)   Filed Weights   04/12/16 1031  Weight: 213 lb 9.6 oz (96.9 kg)    GENERAL:alert, no distress and comfortable SKIN: Noted shingles rash on her torso with blisters, overall improving with scabs EYES: normal, Conjunctiva are pink and non-injected, sclera clear OROPHARYNX noted mild mucositis.  No thrush NECK: supple, thyroid normal size, non-tender, without nodularity LYMPH:  no palpable lymphadenopathy in the cervical, axillary or inguinal LUNGS: clear to auscultation and percussion with normal breathing effort HEART: regular rate & rhythm and no murmurs and no lower extremity edema ABDOMEN:abdomen soft, non-tender and normal bowel sounds.  Feeding tube site looks okay Musculoskeletal:no cyanosis of digits and no clubbing  NEURO: alert & oriented x 3 with fluent speech, no focal motor/sensory deficits  LABORATORY DATA:  I have reviewed the data as listed    Component Value Date/Time   NA 137 04/11/2016 0848   K 4.0 04/11/2016 0848   CL 99 (L) 02/07/2016 0836   CO2 23 04/11/2016 0848   GLUCOSE 122 04/11/2016 0848   BUN 12.1 04/11/2016 0848   CREATININE 1.5 (H) 04/11/2016 0848   CALCIUM 8.9 04/11/2016 0848   PROT 6.7 04/11/2016 0848   ALBUMIN 3.3 (L) 04/11/2016 0848   AST 35 (H) 04/11/2016 0848   ALT 22 04/11/2016 0848   ALKPHOS 116 04/11/2016 0848   BILITOT 0.32 04/11/2016 0848   GFRNONAA 45 (L) 02/08/2016 1859   GFRAA 52 (L) 02/08/2016 1859    No results found for: SPEP, UPEP  Lab Results  Component Value Date   WBC 2.9 (L) 04/11/2016   NEUTROABS 1.9 04/11/2016   HGB 10.1 (L) 04/11/2016   HCT 27.7 (L) 04/11/2016   MCV 78.7 (L) 04/11/2016   PLT 123 (L) 04/11/2016      Chemistry      Component Value Date/Time   NA 137 04/11/2016 0848   K 4.0 04/11/2016 0848   CL 99 (L) 02/07/2016 0836   CO2 23 04/11/2016 0848   BUN 12.1 04/11/2016 0848   CREATININE 1.5 (H) 04/11/2016 0848      Component Value Date/Time   CALCIUM 8.9 04/11/2016 0848    ALKPHOS 116 04/11/2016 0848   AST 35 (H) 04/11/2016 0848   ALT 22 04/11/2016 0848   BILITOT 0.32 04/11/2016 0848       RADIOGRAPHIC STUDIES: I have personally reviewed the radiological images as listed and agreed with the findings in the report. Ir Gastrostomy Tube Mod Sed  Result Date: 03/15/2016 INDICATION: History of tongue cancer. In need of durable intravenous access for chemotherapy administration. Please perform percutaneous gastrostomy tube for enteric nutrition supplementation prior to the initiation of chemoradiation. EXAM: 1. IMPLANTED  PORT A CATH PLACEMENT WITH ULTRASOUND AND FLUOROSCOPIC GUIDANCE 2. FLUOROSCOPIC GUIDED PERCUTANEOUS GASTROSTOMY TUBE PLACEMENT COMPARISON:  CHEST CT - 03/02/2016 MEDICATIONS: Ancef 2 gm IV; The antibiotic was administered within an appropriate time interval prior to skin puncture. ANESTHESIA/SEDATION: Moderate (conscious) sedation was employed during this procedure. A total of Versed 7 mg and Fentanyl 225 mcg was administered intravenously. Moderate Sedation Time: 43 minutes. The patient's level of consciousness and vital signs were monitored continuously by radiology nursing throughout the procedure under my direct supervision. CONTRAST:  20 cc Isovue 300, administered into the stomach lumen. FLUOROSCOPY TIME:  A total of 5 minutes, 12 seconds (279 mGy) fluoroscopy time was utilized for both procedures. COMPLICATIONS: None immediate. PROCEDURE: The procedures, risks, benefits, and alternatives were explained to the patient. Questions regarding the procedure were encouraged and answered. The patient understands and consents to both procedures. Attention was initially paid towards placement of the [facility] a Catheter. The right neck and chest were prepped with chlorhexidine in a sterile fashion, and a sterile drape was applied covering the operative field. Maximum barrier sterile technique with sterile gowns and gloves were used for the procedure. A timeout was  performed prior to the initiation of the procedure. Local anesthesia was provided with 1% lidocaine with epinephrine. After creating a small venotomy incision, a micropuncture kit was utilized to access the internal jugular vein under direct, real-time ultrasound guidance. Ultrasound image documentation was performed. The microwire was kinked to measure appropriate catheter length. A subcutaneous port pocket was then created along the upper chest wall utilizing a combination of sharp and blunt dissection. The pocket was irrigated with sterile saline. A single lumen ISP power injectable port was chosen for placement. The 8 Fr catheter was tunneled from the port pocket site to the venotomy incision. The port was placed in the pocket. The external catheter was trimmed to appropriate length. At the venotomy, an 8 Fr peel-away sheath was placed over a guidewire under fluoroscopic guidance. The catheter was then placed through the sheath and the sheath was removed. Final catheter positioning was confirmed and documented with a fluoroscopic spot radiograph. The port was accessed with a Huber needle, aspirated and flushed with heparinized saline. The venotomy site was closed with an interrupted 4-0 Vicryl suture. The port pocket incision was closed with interrupted 2-0 Vicryl suture and the skin was opposed with a running subcuticular 4-0 Vicryl suture. Dermabond and Steri-strips were applied to both incisions. Dressings were placed. Next, attention was paid towards placement of the gastrostomy tube. The left upper quadrant was sterilely prepped and draped. An oral gastric catheter was inserted into the stomach under fluoroscopy. The existing nasogastric feeding tube was removed. The left costal margin and air / barium opacified transverse colon were identified and avoided. Air was injected into the stomach for insufflation and visualization under fluoroscopy. Under sterile conditions a 17 gauge trocar needle was utilized  to access the stomach percutaneously beneath the left subcostal margin after the overlying soft tissues were anesthetized with 1% Lidocaine with epinephrine. Needle position was confirmed within the stomach with aspiration of air and injection of small amount of contrast. A single T tack was deployed for gastropexy. Over an Amplatz guide wire, a 9-French sheath was inserted into the stomach. A snare device was utilized to capture the oral gastric catheter. The snare device was pulled retrograde from the stomach up the esophagus and out the oropharynx. The 20-French pull-through gastrostomy was connected to the snare device and pulled antegrade through the  oropharynx down the esophagus into the stomach and then through the percutaneous tract external to the patient. The gastrostomy was assembled externally. Contrast injection confirms position in the stomach. Several spot radiographic images were obtained in various obliquities for documentation. Dressings were placed. The patient tolerated both above procedures well without immediate post procedural complication. FINDINGS: After catheter placement, the tip lies within the superior cavoatrial junction. The catheter aspirates and flushes normally and is ready for immediate use. After successful fluoroscopic guided placement, the gastrostomy tube is appropriately positioned with internal disc against the ventral aspect of the gastric lumen. IMPRESSION: 1. Successful placement of a right internal jugular approach power injectable Port-A-Cath. The Port a catheter is ready for immediate use. 2. Successful fluoroscopic insertion of a 20-French pull-through gastrostomy tube. The gastrostomy may be used immediately for medication administration and may be utilized in 24 hrs for the initiation of feeds. Electronically Signed   By: Sandi Mariscal M.D.   On: 03/15/2016 16:56   Ir US Guide Vasc Access Right  Result Date: 03/15/2016 INDICATION: History of tongue cancer. In need  of durable intravenous access for chemotherapy administration. Please perform percutaneous gastrostomy tube for enteric nutrition supplementation prior to the initiation of chemoradiation. EXAM: 1. IMPLANTED PORT A CATH PLACEMENT WITH ULTRASOUND AND FLUOROSCOPIC GUIDANCE 2. FLUOROSCOPIC GUIDED PERCUTANEOUS GASTROSTOMY TUBE PLACEMENT COMPARISON:  CHEST CT - 03/02/2016 MEDICATIONS: Ancef 2 gm IV; The antibiotic was administered within an appropriate time interval prior to skin puncture. ANESTHESIA/SEDATION: Moderate (conscious) sedation was employed during this procedure. A total of Versed 7 mg and Fentanyl 225 mcg was administered intravenously. Moderate Sedation Time: 43 minutes. The patient's level of consciousness and vital signs were monitored continuously by radiology nursing throughout the procedure under my direct supervision. CONTRAST:  20 cc Isovue 300, administered into the stomach lumen. FLUOROSCOPY TIME:  A total of 5 minutes, 12 seconds (279 mGy) fluoroscopy time was utilized for both procedures. COMPLICATIONS: None immediate. PROCEDURE: The procedures, risks, benefits, and alternatives were explained to the patient. Questions regarding the procedure were encouraged and answered. The patient understands and consents to both procedures. Attention was initially paid towards placement of the [facility] a Catheter. The right neck and chest were prepped with chlorhexidine in a sterile fashion, and a sterile drape was applied covering the operative field. Maximum barrier sterile technique with sterile gowns and gloves were used for the procedure. A timeout was performed prior to the initiation of the procedure. Local anesthesia was provided with 1% lidocaine with epinephrine. After creating a small venotomy incision, a micropuncture kit was utilized to access the internal jugular vein under direct, real-time ultrasound guidance. Ultrasound image documentation was performed. The microwire was kinked to measure  appropriate catheter length. A subcutaneous port pocket was then created along the upper chest wall utilizing a combination of sharp and blunt dissection. The pocket was irrigated with sterile saline. A single lumen ISP power injectable port was chosen for placement. The 8 Fr catheter was tunneled from the port pocket site to the venotomy incision. The port was placed in the pocket. The external catheter was trimmed to appropriate length. At the venotomy, an 8 Fr peel-away sheath was placed over a guidewire under fluoroscopic guidance. The catheter was then placed through the sheath and the sheath was removed. Final catheter positioning was confirmed and documented with a fluoroscopic spot radiograph. The port was accessed with a Huber needle, aspirated and flushed with heparinized saline. The venotomy site was closed with an interrupted 4-0 Vicryl  suture. The port pocket incision was closed with interrupted 2-0 Vicryl suture and the skin was opposed with a running subcuticular 4-0 Vicryl suture. Dermabond and Steri-strips were applied to both incisions. Dressings were placed. Next, attention was paid towards placement of the gastrostomy tube. The left upper quadrant was sterilely prepped and draped. An oral gastric catheter was inserted into the stomach under fluoroscopy. The existing nasogastric feeding tube was removed. The left costal margin and air / barium opacified transverse colon were identified and avoided. Air was injected into the stomach for insufflation and visualization under fluoroscopy. Under sterile conditions a 17 gauge trocar needle was utilized to access the stomach percutaneously beneath the left subcostal margin after the overlying soft tissues were anesthetized with 1% Lidocaine with epinephrine. Needle position was confirmed within the stomach with aspiration of air and injection of small amount of contrast. A single T tack was deployed for gastropexy. Over an Amplatz guide wire, a 9-French  sheath was inserted into the stomach. A snare device was utilized to capture the oral gastric catheter. The snare device was pulled retrograde from the stomach up the esophagus and out the oropharynx. The 20-French pull-through gastrostomy was connected to the snare device and pulled antegrade through the oropharynx down the esophagus into the stomach and then through the percutaneous tract external to the patient. The gastrostomy was assembled externally. Contrast injection confirms position in the stomach. Several spot radiographic images were obtained in various obliquities for documentation. Dressings were placed. The patient tolerated both above procedures well without immediate post procedural complication. FINDINGS: After catheter placement, the tip lies within the superior cavoatrial junction. The catheter aspirates and flushes normally and is ready for immediate use. After successful fluoroscopic guided placement, the gastrostomy tube is appropriately positioned with internal disc against the ventral aspect of the gastric lumen. IMPRESSION: 1. Successful placement of a right internal jugular approach power injectable Port-A-Cath. The Port a catheter is ready for immediate use. 2. Successful fluoroscopic insertion of a 20-French pull-through gastrostomy tube. The gastrostomy may be used immediately for medication administration and may be utilized in 24 hrs for the initiation of feeds. Electronically Signed   By: Sandi Mariscal M.D.   On: 03/15/2016 16:56   Dg Swallowing Func-speech Pathology  Result Date: 03/13/2016 Objective Swallowing Evaluation: Type of Study: MBS-Modified Barium Swallow Study Patient Details Name: Tiffany Yu MRN: [id number] Date of Birth: 02-Aug-1955 Today's Date: 03/13/2016 Time: SLP Start Time (ACUTE ONLY): 1343-SLP Stop Time (ACUTE ONLY): 1410 SLP Time Calculation (min) (ACUTE ONLY): 27 min Past Medical History: Past Medical History: Diagnosis Date . Anxiety  . Arthritis  .  Depression   takes ZYban daily . History of colon polyps   benign . Hyperlipidemia   takes Atorvastatin daily . Hypertension   takes HCTZ daily . Hypothyroidism   takes Synthroid daily . Insomnia   takes Ambien nightly and Xanax as needed . Refusal of blood transfusions as patient is Jehovah's Witness  Past Surgical History: Past Surgical History: Procedure Laterality Date . CHOLECYSTECTOMY   . COLONOSCOPY   . DILATION AND CURETTAGE OF UTERUS   . EXCISION OF TONGUE LESION Left 02/08/2016  Procedure: WIDE LOCAL EXCISION OF LEFT LATERAL TONGUE;  Surgeon: Jerrell Belfast, MD;  Location: [place];  Service: ENT;  Laterality: Left; . FEMUR IM NAIL Right 02/28/2012  Procedure: OPEN REDUCTION INTERNAL FIXATION (ORIF) RIGHT KNEE;  Surgeon: Alta Corning, MD;  Location: [place];  Service: Orthopedics;  Laterality: Right; . KNEE ARTHROPLASTY  12/14/2011  Procedure: COMPUTER ASSISTED TOTAL KNEE ARTHROPLASTY;  Surgeon: Alta Corning, MD;  Location: [place];  Service: Orthopedics;  Laterality: Right; . KNEE ARTHROSCOPY    Right . PLANTAR FASCIA SURGERY    right foot . RADICAL NECK DISSECTION Left 02/08/2016  Procedure: LEFT SELECTIVE RADICAL NECK DISSECTION;  Surgeon: Jerrell Belfast, MD;  Location: [facility] OR;  Service: ENT;  Laterality: Left; . TENDON RELEASE    right wrist . TOTAL KNEE ARTHROPLASTY  12/14/2011  right knee HPI: Pt is 2 yof with dx of anterior lt lateral tongue SCCA S/P lt neck dissection and lateral tongue resection on 02-08-16. Rad tx begins 03-15-16, chemo begins 03-16-16. Pt with PEG placement Thursday 03-15-16.  Pt reports improved swallow/speech since recovery from surgery.   Subjective: pt ambulated easily into xray department, friendly Assessment / Plan / Recommendation CH[facility] IP CLINICAL IMPRESSIONS 03/13/2016 Clinical Impression Pt presents with mild oral dysphagia due to structural changes and ? 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  Oral difficultlies characterized by oral  discoordination/transiting difficulties.  She compensates well by piecemealing and placing boluses on right side of oral cavity.  Pharyngeal swallow was strong with only occasional minimal residuals of thin liquids.  No aspiration or penetration noted.  Barium tablet taken with pudding after inability to orally transit with thin transited pharynx easily.  Tablet appeared to lodge in mid=esophagus with initial referrant sensastion to pharynx - that abated with pt reflexively swallowing.  Pt appeared with backflow of liquids at mid=esophagus when taken to clear tablet - without sensation.  Advised pt to assure adequate liquid intake with meds with puree.  Encouraged pt to continue po as tolerated during XRT and provided written handout for xerostomia, possible radiation side effectis.  Provided pt with syringe to use for liquid adminiistration/oral care as XRT progressed.  Thanks for this referral.  SLP Visit Diagnosis Dysphagia, oral phase (R13.11) Attention and concentration deficit following -- Frontal lobe and executive function deficit following -- Impact on safety and function Mild aspiration risk     [facility] IP DIET RECOMMENDATION 03/13/2016 SLP Diet Recommendations Dysphagia 3 (Mech soft) solids;Thin liquid Liquid Administration via Cup;Straw Medication Administration Whole meds with puree Compensations Slow rate;Small sips/bites;Lingual sweep for clearance of pocketing Postural Changes Remain semi-upright after after feeds/meals (Comment);Seated upright at 90 degrees   [facility] IP OTHER RECOMMENDATIONS 03/13/2016 Recommended Consults (No Data) Oral Care Recommendations Oral care BID Other Recommendations --   [facility] IP FOLLOW UP RECOMMENDATIONS 03/13/2016 Follow up Recommendations (No Data)   No flowsheet data found.     [facility] IP ORAL PHASE 03/13/2016 Oral Phase Impaired Oral - Pudding Teaspoon -- Oral - Pudding Cup -- Oral - Honey Teaspoon -- Oral - Honey Cup -- Oral - Nectar Teaspoon -- Oral - Nectar Cup Weak lingual  manipulation;Reduced posterior propulsion;Piecemeal swallowing;Premature spillage Oral - Nectar Straw -- Oral - Thin Teaspoon -- Oral - Thin Cup Weak lingual manipulation;Reduced posterior propulsion;Piecemeal swallowing;Delayed oral transit;Decreased bolus cohesion;Premature spillage Oral - Thin Straw Weak lingual manipulation;Reduced posterior propulsion;Piecemeal swallowing;Decreased bolus cohesion;Premature spillage Oral - Puree Weak lingual manipulation;Lingual pumping;Reduced posterior propulsion;Piecemeal swallowing;Delayed oral transit;Premature spillage Oral - Mech Soft Impaired mastication;Weak lingual manipulation;Reduced posterior propulsion;Piecemeal swallowing;Delayed oral transit Oral - Regular -- Oral - Multi-Consistency -- Oral - Pill Weak lingual manipulation;Reduced posterior propulsion;Lingual/palatal residue;Piecemeal swallowing;Decreased bolus cohesion Oral Phase - Comment pt placed bolus in right oral cavity to aid transiting, barium tablet placed on right mid=[place] region but needed puree to transit  [facility]  IP PHARYNGEAL PHASE 03/13/2016 Pharyngeal Phase WFL Pharyngeal- Pudding Teaspoon -- Pharyngeal -- Pharyngeal- Pudding Cup -- Pharyngeal -- Pharyngeal- Honey Teaspoon -- Pharyngeal -- Pharyngeal- Honey Cup -- Pharyngeal -- Pharyngeal- Nectar Teaspoon -- Pharyngeal -- Pharyngeal- Nectar Cup WFL Pharyngeal -- Pharyngeal- Nectar Straw -- Pharyngeal -- Pharyngeal- Thin Teaspoon -- Pharyngeal -- Pharyngeal- Thin Cup WFL Pharyngeal -- Pharyngeal- Thin Straw WFL Pharyngeal -- Pharyngeal- Puree WFL Pharyngeal -- Pharyngeal- Mechanical Soft WFL Pharyngeal -- Pharyngeal- Regular -- Pharyngeal -- Pharyngeal- Multi-consistency -- Pharyngeal -- Pharyngeal- Pill WFL Pharyngeal -- Pharyngeal Comment --  [facility] IP CERVICAL ESOPHAGEAL PHASE 03/13/2016 Cervical Esophageal Phase WFL Pudding Teaspoon -- Pudding Cup -- Honey Teaspoon -- Honey Cup -- Nectar Teaspoon -- Nectar Cup -- Nectar Straw -- Thin Teaspoon --  Thin Cup -- Thin Straw -- Puree -- Mechanical Soft -- Regular -- Multi-consistency -- Pill -- Cervical Esophageal Comment -- [facility] IP GO 03/13/2016 Functional Assessment Tool Used MBS, clinical judgement Functional Limitations Swallowing Swallow Current Status (Z6010) CI Swallow Goal Status (X3235) CI Swallow Discharge Status (T7322) CI Luanna Salk, MS [facility] SLP [phone] Macario Golds 03/13/2016, 7:20 PM              Ir Cyndy Freeze Guide Port Insertion Right  Result Date: 03/15/2016 INDICATION: History of tongue cancer. In need of durable intravenous access for chemotherapy administration. Please perform percutaneous gastrostomy tube for enteric nutrition supplementation prior to the initiation of chemoradiation. EXAM: 1. IMPLANTED PORT A CATH PLACEMENT WITH ULTRASOUND AND FLUOROSCOPIC GUIDANCE 2. FLUOROSCOPIC GUIDED PERCUTANEOUS GASTROSTOMY TUBE PLACEMENT COMPARISON:  CHEST CT - 03/02/2016 MEDICATIONS: Ancef 2 gm IV; The antibiotic was administered within an appropriate time interval prior to skin puncture. ANESTHESIA/SEDATION: Moderate (conscious) sedation was employed during this procedure. A total of Versed 7 mg and Fentanyl 225 mcg was administered intravenously. Moderate Sedation Time: 43 minutes. The patient's level of consciousness and vital signs were monitored continuously by radiology nursing throughout the procedure under my direct supervision. CONTRAST:  20 cc Isovue 300, administered into the stomach lumen. FLUOROSCOPY TIME:  A total of 5 minutes, 12 seconds (279 mGy) fluoroscopy time was utilized for both procedures. COMPLICATIONS: None immediate. PROCEDURE: The procedures, risks, benefits, and alternatives were explained to the patient. Questions regarding the procedure were encouraged and answered. The patient understands and consents to both procedures. Attention was initially paid towards placement of the [facility] a Catheter. The right neck and chest were prepped with chlorhexidine in a sterile  fashion, and a sterile drape was applied covering the operative field. Maximum barrier sterile technique with sterile gowns and gloves were used for the procedure. A timeout was performed prior to the initiation of the procedure. Local anesthesia was provided with 1% lidocaine with epinephrine. After creating a small venotomy incision, a micropuncture kit was utilized to access the internal jugular vein under direct, real-time ultrasound guidance. Ultrasound image documentation was performed. The microwire was kinked to measure appropriate catheter length. A subcutaneous port pocket was then created along the upper chest wall utilizing a combination of sharp and blunt dissection. The pocket was irrigated with sterile saline. A single lumen ISP power injectable port was chosen for placement. The 8 Fr catheter was tunneled from the port pocket site to the venotomy incision. The port was placed in the pocket. The external catheter was trimmed to appropriate length. At the venotomy, an 8 Fr peel-away sheath was placed over a guidewire under fluoroscopic guidance. The catheter was then placed through the sheath and the sheath was removed.  Final catheter positioning was confirmed and documented with a fluoroscopic spot radiograph. The port was accessed with a Huber needle, aspirated and flushed with heparinized saline. The venotomy site was closed with an interrupted 4-0 Vicryl suture. The port pocket incision was closed with interrupted 2-0 Vicryl suture and the skin was opposed with a running subcuticular 4-0 Vicryl suture. Dermabond and Steri-strips were applied to both incisions. Dressings were placed. Next, attention was paid towards placement of the gastrostomy tube. The left upper quadrant was sterilely prepped and draped. An oral gastric catheter was inserted into the stomach under fluoroscopy. The existing nasogastric feeding tube was removed. The left costal margin and air / barium opacified transverse colon  were identified and avoided. Air was injected into the stomach for insufflation and visualization under fluoroscopy. Under sterile conditions a 17 gauge trocar needle was utilized to access the stomach percutaneously beneath the left subcostal margin after the overlying soft tissues were anesthetized with 1% Lidocaine with epinephrine. Needle position was confirmed within the stomach with aspiration of air and injection of small amount of contrast. A single T tack was deployed for gastropexy. Over an Amplatz guide wire, a 9-French sheath was inserted into the stomach. A snare device was utilized to capture the oral gastric catheter. The snare device was pulled retrograde from the stomach up the esophagus and out the oropharynx. The 20-French pull-through gastrostomy was connected to the snare device and pulled antegrade through the oropharynx down the esophagus into the stomach and then through the percutaneous tract external to the patient. The gastrostomy was assembled externally. Contrast injection confirms position in the stomach. Several spot radiographic images were obtained in various obliquities for documentation. Dressings were placed. The patient tolerated both above procedures well without immediate post procedural complication. FINDINGS: After catheter placement, the tip lies within the superior cavoatrial junction. The catheter aspirates and flushes normally and is ready for immediate use. After successful fluoroscopic guided placement, the gastrostomy tube is appropriately positioned with internal disc against the ventral aspect of the gastric lumen. IMPRESSION: 1. Successful placement of a right internal jugular approach power injectable Port-A-Cath. The Port a catheter is ready for immediate use. 2. Successful fluoroscopic insertion of a 20-French pull-through gastrostomy tube. The gastrostomy may be used immediately for medication administration and may be utilized in 24 hrs for the initiation of  feeds. Electronically Signed   By: Sandi Mariscal M.D.   On: 03/15/2016 16:56    ASSESSMENT & PLAN:  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[place]) The patient has improvement of renal failure with aggressive IV fluid hydration daily.   I will resume chemotherapy this week with dose modification She will continue blood draw weekly I will continue to see her weekly for supportive care  Pancytopenia, acquired [facility]) This is likely due to side effects of treatment. She is not symptomatic. She does not need blood transfusion. We will continue dose modification as above  Cancer associated pain She has worsening mucositis pain and has started to use liquid morphine as prescribed. If she start to use more than 3 doses of liquid morphine per day, I recommend she start using fentanyl patch.  Acute renal failure ([place]) This is secondary to poor oral intake, dehydration and recent chemotherapy. With aggressive IV fluid resuscitation, her serum creatinine is improving. She will resume chemotherapy tomorrow with 50% dose adjustment  Shingles rash She has recent shingles outbreak and is prescribed medication for this. The blisters are healing. I recommend placing her on isolation  while receiving chemotherapy tomorrow   No orders of the defined types were placed in this encounter.  All questions were answered. The patient knows to call the clinic with any problems, questions or concerns. No barriers to learning was detected. I spent 25 minutes counseling the patient face to face. The total time spent in the appointment was 30 minutes and more than 50% was on counseling and review of test results     [name], MD 04/12/2016 10:55 AM

## 2016-04-12 NOTE — Patient Instructions (Signed)
Dehydration, Adult Dehydration is a condition in which there is not enough fluid or water in the body. This happens when you lose more fluids than you take in. Important organs, such as the kidneys, brain, and heart, cannot function without a proper amount of fluids. Any loss of fluids from the body can lead to dehydration. Dehydration can range from mild to severe. This condition should be treated right away to prevent it from becoming severe. What are the causes? This condition may be caused by:  Vomiting.  Diarrhea.  Excessive sweating, such as from heat exposure or exercise.  Not drinking enough fluid, especially:  When ill.  While doing activity that requires a lot of energy.  Excessive urination.  Fever.  Infection.  Certain medicines, such as medicines that cause the body to lose excess fluid (diuretics).  Inability to access safe drinking water.  Reduced physical ability to get adequate water and food. What increases the risk? This condition is more likely to develop in people:  Who have a poorly controlled long-term (chronic) illness, such as diabetes, heart disease, or kidney disease.  Who are age 65 or older.  Who are disabled.  Who live in a place with high altitude.  Who play endurance sports. What are the signs or symptoms? Symptoms of mild dehydration may include:   Thirst.  Dry lips.  Slightly dry mouth.  Dry, warm skin.  Dizziness. Symptoms of moderate dehydration may include:   Very dry mouth.  Muscle cramps.  Dark urine. Urine may be the color of tea.  Decreased urine production.  Decreased tear production.  Heartbeat that is irregular or faster than normal (palpitations).  Headache.  Light-headedness, especially when you stand up from a sitting position.  Fainting (syncope). Symptoms of severe dehydration may include:   Changes in skin, such as:  Cold and clammy skin.  Blotchy (mottled) or pale skin.  Skin that does  not quickly return to normal after being lightly pinched and released (poor skin turgor).  Changes in body fluids, such as:  Extreme thirst.  No tear production.  Inability to sweat when body temperature is high, such as in hot weather.  Very little urine production.  Changes in vital signs, such as:  Weak pulse.  Pulse that is more than 100 beats a minute when sitting still.  Rapid breathing.  Low blood pressure.  Other changes, such as:  Sunken eyes.  Cold hands and feet.  Confusion.  Lack of energy (lethargy).  Difficulty waking up from sleep.  Short-term weight loss.  Unconsciousness. How is this diagnosed? This condition is diagnosed based on your symptoms and a physical exam. Blood and urine tests may be done to help confirm the diagnosis. How is this treated? Treatment for this condition depends on the severity. Mild or moderate dehydration can often be treated at home. Treatment should be started right away. Do not wait until dehydration becomes severe. Severe dehydration is an emergency and it needs to be treated in a hospital. Treatment for mild dehydration may include:   Drinking more fluids.  Replacing salts and minerals in your blood (electrolytes) that you may have lost. Treatment for moderate dehydration may include:   Drinking an oral rehydration solution (ORS). This is a drink that helps you replace fluids and electrolytes (rehydrate). It can be found at pharmacies and retail stores. Treatment for severe dehydration may include:   Receiving fluids through an IV tube.  Receiving an electrolyte solution through a feeding tube that is   passed through your nose and into your stomach (nasogastric tube, or NG tube).  Correcting any abnormalities in electrolytes.  Treating the underlying cause of dehydration. Follow these instructions at home:  If directed by your health care provider, drink an ORS:  Make an ORS by following instructions on the  package.  Start by drinking small amounts, about  cup (120 mL) every 5-10 minutes.  Slowly increase how much you drink until you have taken the amount recommended by your health care provider.  Drink enough clear fluid to keep your urine clear or pale yellow. If you were told to drink an ORS, finish the ORS first, then start slowly drinking other clear fluids. Drink fluids such as:  Water. Do not drink only water. Doing that can lead to having too little salt (sodium) in the body (hyponatremia).  Ice chips.  Fruit juice that you have added water to (diluted fruit juice).  Low-calorie sports drinks.  Avoid:  Alcohol.  Drinks that contain a lot of sugar. These include high-calorie sports drinks, fruit juice that is not diluted, and soda.  Caffeine.  Foods that are greasy or contain a lot of fat or sugar.  Take over-the-counter and prescription medicines only as told by your health care provider.  Do not take sodium tablets. This can lead to having too much sodium in the body (hypernatremia).  Eat foods that contain a healthy balance of electrolytes, such as bananas, oranges, potatoes, tomatoes, and spinach.  Keep all follow-up visits as told by your health care provider. This is important. Contact a health care provider if:  You have abdominal pain that:  Gets worse.  Stays in one area (localizes).  You have a rash.  You have a stiff neck.  You are more irritable than usual.  You are sleepier or more difficult to wake up than usual.  You feel weak or dizzy.  You feel very thirsty.  You have urinated only a small amount of very dark urine over 6-8 hours. Get help right away if:  You have symptoms of severe dehydration.  You cannot drink fluids without vomiting.  Your symptoms get worse with treatment.  You have a fever.  You have a severe headache.  You have vomiting or diarrhea that:  Gets worse.  Does not go away.  You have blood or green matter  (bile) in your vomit.  You have blood in your stool. This may cause stool to look black and tarry.  You have not urinated in 6-8 hours.  You faint.  Your heart rate while sitting still is over 100 beats a minute.  You have trouble breathing. This information is not intended to replace advice given to you by your health care provider. Make sure you discuss any questions you have with your health care provider. Document Released: 12/18/2004 Document Revised: 07/15/2015 Document Reviewed: 02/11/2015 Elsevier Interactive Patient Education  2017 [employer]  

## 2016-04-12 NOTE — Assessment & Plan Note (Signed)
She has worsening mucositis pain and has started to use liquid morphine as prescribed. If she start to use more than 3 doses of liquid morphine per day, I recommend she start using fentanyl patch.

## 2016-04-12 NOTE — Progress Notes (Signed)
RN visit for IV fluids. 

## 2016-04-13 ENCOUNTER — Encounter: Payer: Self-pay | Admitting: Radiation Oncology

## 2016-04-13 ENCOUNTER — Encounter: Payer: Self-pay | Admitting: Nurse Practitioner

## 2016-04-13 ENCOUNTER — Ambulatory Visit (HOSPITAL_BASED_OUTPATIENT_CLINIC_OR_DEPARTMENT_OTHER): Payer: 59

## 2016-04-13 ENCOUNTER — Ambulatory Visit
Admission: RE | Admit: 2016-04-13 | Discharge: 2016-04-13 | Disposition: A | Discharge status: Home / Self Care | Payer: 59 | Source: Ambulatory Visit | Attending: Radiation Oncology | Admitting: Radiation Oncology

## 2016-04-13 ENCOUNTER — Ambulatory Visit (HOSPITAL_BASED_OUTPATIENT_CLINIC_OR_DEPARTMENT_OTHER): Payer: 59 | Admitting: Nurse Practitioner

## 2016-04-13 ENCOUNTER — Other Ambulatory Visit: Payer: Self-pay | Admitting: Nurse Practitioner

## 2016-04-13 VITALS — BP 135/84 | HR 89 | Temp 98.7°F | Resp 16

## 2016-04-13 DIAGNOSIS — C023 Malignant neoplasm of anterior two-thirds of tongue, part unspecified: Secondary | ICD-10-CM

## 2016-04-13 DIAGNOSIS — L03011 Cellulitis of right finger: Secondary | ICD-10-CM | POA: Insufficient documentation

## 2016-04-13 DIAGNOSIS — Z510 Encounter for antineoplastic radiation therapy: Secondary | ICD-10-CM | POA: Diagnosis not present

## 2016-04-13 MED ORDER — SODIUM CHLORIDE 0.9 % IV SOLN
Freq: Once | INTRAVENOUS | Status: AC
  Administered 2016-04-13: 11:00:00 via INTRAVENOUS

## 2016-04-13 MED ORDER — POTASSIUM CHLORIDE 2 MEQ/ML IV SOLN
Freq: Once | INTRAVENOUS | Status: DC
  Filled 2016-04-13: qty 10

## 2016-04-13 MED ORDER — SODIUM CHLORIDE 0.9% FLUSH
10.0000 mL | INTRAVENOUS | Status: DC | PRN
  Administered 2016-04-13: 10 mL
  Filled 2016-04-13: qty 10

## 2016-04-13 MED ORDER — CEPHALEXIN 250 MG/5ML PO SUSR: 500.0000 mg | Freq: Three times a day (TID) | ORAL | 0 refills | Status: DC

## 2016-04-13 MED ORDER — SODIUM CHLORIDE 0.9 % IV SOLN: Freq: Once | INTRAVENOUS | Status: DC

## 2016-04-13 MED ORDER — HEPARIN SOD (PORK) LOCK FLUSH 100 UNIT/ML IV SOLN
500.0000 [IU] | Freq: Once | INTRAVENOUS | Status: AC | PRN
  Administered 2016-04-13: 500 [IU]
  Filled 2016-04-13: qty 5

## 2016-04-13 MED FILL — CEPHALEXIN 250 MG/5 ML SUSP: 250 | 10 days supply | Qty: 300 | Fill #0

## 2016-04-13 NOTE — Assessment & Plan Note (Signed)
Patient states that she developed edema, warmth and tenderness to the right thumb at the cuticle approximately 24-48 hours ago.  Patient has artificial nails on.  On exam today does appear the patient has a significant paronychia to the right corner of her cuticle.  The area is red, warm, and edematous.  It is also very tender.  She has some trace erythema that radiates to her wrist.  Reviewed all findings with Dr. Alvy Bimler; and she was in agreement that the paronychia should be drained.  Discussed the options of performing a lidocaine nerve block to the finger versus numbing the area with ice-and patient chose to numb the area with ice instead of a lidocaine injections.  The area was then prepped by cleaning with Betadine and then alcohol; and then this provider used an 18-gauge sterile needle to open and drain the site.  Obtained a small amount of yellow purulent fluid from the site.  Patient was advised to soak her finger approximately 3 times per day; and to keep it covered at all other times until it resolves.  Also, will prescribe Keflex antibiotics for patient as well.  Since patient does have a PEG tube-she stated that she could crush the tablets to place into her PEG.  Patient was advised to call/return or go directly to the emergency department for any worsening symptoms whatsoever.  Also, give patient some supplies and Neosporin to take home as well.

## 2016-04-13 NOTE — Patient Instructions (Signed)
Paronychia Paronychia is an infection of the skin. It happens near a fingernail or toenail. It may cause pain and swelling around the nail. Usually, it is not serious and it clears up with treatment. Follow these instructions at home:  Soak the fingers or toes in warm salt water as told by your doctor. You may be told to do this for 20 minutes, 2-3 times a day.  Keep the area dry when you are not soaking it.  Take medicines only as told by your doctor.  If you were given an antibiotic medicine, finish all of it even if you start to feel better.  Keep the affected area clean.  Do not try to drain a fluid-filled bump yourself.  Wear rubber gloves when putting your hands in water.  Wear gloves if your hands might touch cleaners or chemicals.  Follow your doctor's instructions about:  Wound care.  Bandage (dressing) changes and removal. Contact a doctor if:  Your symptoms get worse or do not improve.  You have a fever or chills.  You have redness spreading from the affected area.  You have more fluid, blood, or pus coming from the affected area.  Your finger or knuckle is swollen or is hard to move. This information is not intended to replace advice given to you by your health care provider. Make sure you discuss any questions you have with your health care provider. Document Released: 12/06/2008 Document Revised: 05/26/2015 Document Reviewed: 11/25/2013 Elsevier Interactive Patient Education  2017 [employer].

## 2016-04-13 NOTE — Progress Notes (Signed)
SYMPTOM MANAGEMENT CLINIC    Chief Complaint: Paronychia  HPI:  Tiffany Yu 61 y.o. female diagnosed with tongue cancer.  Currently undergoing cisplatin chemotherapy and radiation treatments.     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[place])   02/08/2016 Pathology Results    Diagnosis 1. 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 Tongue, biopsy, Anterior deep margin left SQUAMOUS CELL CARCINOMA 3. Tongue, biopsy, Posterior deep margin left NEGATIVE FOR CARCINOMA 4. Tongue, biopsy, Posterior margin left SQUAMOUS CELL CARCINOMA 5. Tongue, excisional biopsy, New anterior deep margin NEGATIVE FOR CARCINOMA 6. Tongue, excisional biopsy, New posterior deep margin SMALL FOCUS OF SQUAMOUS CELL CARCINOMA (0.1 CM, ONLY PRESENT AT THE DEEPER PERMINENT SECTION) 7. Tongue, resection for tumor, Left lateral INFILTRATIVE KERATINIZED SQUAMOUS CELL CARCINOMA (3.2 CM) THE TUMOR INVADES UNDERNEATH MUSCLE OF THE TONGUE (2.0 CM, PT2) LYMPHOVASCULAR AND PERINEURAL INVASION IDENTIFIED SQUAMOUS CELL CARCINOMA PRESENTED AT (FINAL MARGINS REFER TO PART 2-6) LATERAL INKED MARGIN (1.0 CM) MEDIAL INKED MARGIN (0.6 CM)      02/08/2016 Surgery    SURGICAL PROCEDURES: 1. Left hemiglossectomy with primary closure. 2. Left selective neck dissection (zones I-III).      03/02/2016 Imaging    CT chest with contrast: No evidence of metastatic disease in the chest. 2. Aortic atherosclerosis.       03/02/2016 Imaging    CT neck with contrast showed : Newly enlarged and round 10 mm right level 1b lymph node (series 5, image 45). The level 1A nodes remain normal. This right 1 B node is nonspecific but suspicious for progressive nodal metastatic disease. It might be amenable to  Ultrasound-guided FNA. 2. Interval left hemiglossectomy and selective left neck dissection with confluent indeterminate soft tissue from the anterior left submandibular space through the left carotid space and obscuring the left level 1B, level 2, and level IIIa nodal stations. This study will serve as a new postoperative baseline of this area. Attention directed on close interval follow-up. 3. Mild soft tissue thickening also along the left lateral tongue resection margin. Attention directed on followup. 4. Chest CT today reported separately      03/14/2016 Procedure    She has normal baseline hearing test      03/15/2016 Procedure    1. Successful placement of a right internal jugular approach power injectable Port-A-Cath. The Port a catheter is ready for immediate use. 2. Successful fluoroscopic insertion of a 20-French pull-through gastrostomy tube. The gastrostomy may be used immediately for medication administration and may be utilized in 24 hrs for the initiation of feeds.      03/15/2016 -  Radiation Therapy    She received concurrent radiation treatment      03/16/2016 -  Chemotherapy    She received weekly cisplatin       04/04/2016 Adverse Reaction    Chemotherapy is placed on hold due to acute renal failure       Review of Systems  Skin:       Right thumb paronychia  All other systems reviewed and are negative.   Past Medical History:  Diagnosis Date  . Anxiety   . Arthritis   . Depression    takes ZYban daily  . History of colon polyps    benign  . Hyperlipidemia  takes Atorvastatin daily  . Hypertension    takes HCTZ daily  . Hypothyroidism    takes Synthroid daily  . Insomnia    takes Ambien nightly and Xanax as needed  . Refusal of blood transfusions as patient is Jehovah's Witness     Past Surgical History:  Procedure Laterality Date  . CHOLECYSTECTOMY    . COLONOSCOPY    . DILATION AND CURETTAGE OF UTERUS    . EXCISION OF TONGUE LESION Left  02/08/2016   Procedure: WIDE LOCAL EXCISION OF LEFT LATERAL TONGUE;  Surgeon: Jerrell Belfast, MD;  Location: [facility];  Service: ENT;  Laterality: Left;  . FEMUR IM NAIL Right 02/28/2012   Procedure: OPEN REDUCTION INTERNAL FIXATION (ORIF) RIGHT KNEE;  Surgeon: Alta Corning, MD;  Location: [facility];  Service: Orthopedics;  Laterality: Right;  . IR GENERIC HISTORICAL  03/15/2016   IR FLUORO GUIDE PORT INSERTION RIGHT 03/15/2016 Sandi Mariscal, MD [facility]  . IR GENERIC HISTORICAL  03/15/2016   IR US GUIDE VASC ACCESS RIGHT 03/15/2016 Sandi Mariscal, MD [facility]  . IR GENERIC HISTORICAL  03/15/2016   IR GASTROSTOMY TUBE MOD SED 03/15/2016 [facility]  . KNEE ARTHROPLASTY  12/14/2011   Procedure: COMPUTER ASSISTED TOTAL KNEE ARTHROPLASTY;  Surgeon: Alta Corning, MD;  Location: [facility];  Service: Orthopedics;  Laterality: Right;  . KNEE ARTHROSCOPY     Right  . PLANTAR FASCIA SURGERY     right foot  . RADICAL NECK DISSECTION Left 02/08/2016   Procedure: LEFT SELECTIVE RADICAL NECK DISSECTION;  Surgeon: Jerrell Belfast, MD;  Location: [facility];  Service: ENT;  Laterality: Left;  . TENDON RELEASE     right wrist  . TOTAL KNEE ARTHROPLASTY  12/14/2011   right knee    has Osteoarthritis of right knee; Fracture of femur, distal, right, closed ([place]); Snoring; Chronic allergic rhinitis; Morbid obesity ([place]); Insomnia due to anxiety and fear; OSA on CPAP; 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[place]); Tongue cancer ([place]); Cancer associated pain; Weight loss; Dysarthria; Goals of care, counseling/discussion; Acute renal failure ([place]); Constipation; Pancytopenia, acquired ([place]); Shingles rash; and Paronychia of right thumb on her problem list.    has No Known Allergies.  Allergies as of 04/13/2016   No Known Allergies     Medication List       Accurate as of 04/13/16 11:22 AM. Always use your most recent med list.          acyclovir 200 MG/5ML suspension Commonly known as:  ZOVIRAX Take 20 mLs (800 mg  total) by mouth every 8 (eight) hours.   ALPRAZolam 0.5 MG tablet Commonly known as:  XANAX Take 0.5 mg by mouth at bedtime as needed for sleep.   atorvastatin 10 MG tablet Commonly known as:  LIPITOR Take 10 mg by mouth daily at 6 PM.   buPROPion 150 MG 12 hr tablet Commonly known as:  ZYBAN Take 150 mg by mouth daily.   chlorhexidine 0.12 % solution Commonly known as:  PERIDEX Rinse with 15 mls three times daily for 30 seconds. Use after breakfast, dinner, and at bedtime. Spit out excess. Do not swallow.   feeding supplement (OSMOLITE 1.5 CAL) Liqd Begin Osmolite 1.5 via PEG, 1 can QID with 60 mL free water before and after. Increase to goal rate of 2 cans BID and 1.5 cans BID via PEG. Give additional 240 mL free water TID between feedings. Please send formula and supplies.   fentaNYL 25 MCG/HR patch Commonly known  as:  [place] - dosed mcg/hr Place 1 patch (25 mcg total) onto the skin every 3 (three) days.   fluconazole 100 MG tablet Commonly known as:  DIFLUCAN Take 2 tablets today, then 1 tablet daily x 20 more days. Hold Atorvastatin while on this drug.   gabapentin 300 MG capsule Commonly known as:  NEURONTIN Take 1 capsule (300 mg total) by mouth 2 (two) times daily as needed.   HYDROcodone-acetaminophen 7.5-325 mg/15 ml solution Commonly known as:  HYCET Take 10-15 mLs by mouth every 4 (four) hours as needed for moderate pain.   levothyroxine 75 MCG tablet Commonly known as:  SYNTHROID, LEVOTHROID Take 75 mcg by mouth daily before breakfast.   lidocaine 2 % solution Commonly known as:  XYLOCAINE Patient: Mix 1part 2% viscous lidocaine, 1part H20. Swish and swallow 26m of this mixture, 365m before meals and at bedtime, up to QID   lidocaine 5 % ointment Commonly known as:  XYLOCAINE Apply 1 application topically as needed. Around feeding tube area for severe pain   lidocaine-prilocaine cream Commonly known as:  EMLA Apply to affected area once     morphine CONCENTRATE 10 mg / 0.5 ml concentrated solution Take 0.5 mLs (10 mg total) by mouth every 2 (two) hours as needed for severe pain.   ondansetron 8 MG tablet Commonly known as:  ZOFRAN Take 1 tablet (8 mg total) by mouth 2 (two) times daily as needed. Start on the third day after chemotherapy.   prochlorperazine 10 MG tablet Commonly known as:  COMPAZINE Take 1 tablet (10 mg total) by mouth every 6 (six) hours as needed (Nausea or vomiting).   senna 8.6 MG tablet Commonly known as:  SENOKOT Take 1 tablet by mouth 2 (two) times daily.   triamcinolone 0.1 % paste Commonly known as:  KENALOG APPLY TO AFFECTED AREA   zolpidem 10 MG tablet Commonly known as:  AMBIEN Take 10 mg by mouth at bedtime.        PHYSICAL EXAMINATION  Oncology Vitals 04/12/2016 04/11/2016  Height 170 cm 170 cm  Weight 96.888 kg 96.707 kg  Weight (lbs) 213 lbs 10 oz 213 lbs 3 oz  BMI (kg/m2) 33.45 kg/m2 33.39 kg/m2  Temp 98.5 98.4  Pulse 97 100  Resp 18 18  SpO2 100 98  BSA (m2) 2.14 m2 2.14 m2   BP Readings from Last 2 Encounters:  04/12/16 (!) 107/57  04/11/16 129/72    Physical Exam  Constitutional: She is oriented to person, place, and time. She appears unhealthy.  HENT:  Head: Normocephalic and atraumatic.  Eyes: Conjunctivae and EOM are normal. Pupils are equal, round, and reactive to light.  Neck: Normal range of motion.  Pulmonary/Chest: Effort normal. No respiratory distress.  Musculoskeletal: Normal range of motion. She exhibits edema and tenderness.  Neurological: She is alert and oriented to person, place, and time.  Skin: Skin is warm.  On exam today does appear the patient has a significant paronychia to the right corner of her cuticle.  The area is red, warm, and edematous.  It is also very tender.  She has some trace erythema that radiates to her wrist.           Nursing note and vitals reviewed.   LABORATORY DATA:. Appointment on 04/11/2016  Component  Date Value Ref Range Status  . Sodium 04/11/2016 137  136 - 145 mEq/L Final  . Potassium 04/11/2016 4.0  3.5 - 5.1 mEq/L Final  . Chloride 04/11/2016 104  98 - 109 mEq/L Final  . CO2 04/11/2016 23  22 - 29 mEq/L Final  . Glucose 04/11/2016 122  70 - 140 mg/dl Final  . BUN 04/11/2016 12.1  7.0 - 26.0 mg/dL Final  . Creatinine 04/11/2016 1.5* 0.6 - 1.1 mg/dL Final  . Total Bilirubin 04/11/2016 0.32  0.20 - 1.20 mg/dL Final  . Alkaline Phosphatase 04/11/2016 116  40 - 150 U/L Final  . AST 04/11/2016 35* 5 - 34 U/L Final  . ALT 04/11/2016 22  0 - 55 U/L Final  . Total Protein 04/11/2016 6.7  6.4 - 8.3 g/dL Final  . Albumin 04/11/2016 3.3* 3.5 - 5.0 g/dL Final  . Calcium 04/11/2016 8.9  8.4 - 10.4 mg/dL Final  . Anion Gap 04/11/2016 10  3 - 11 mEq/L Final  . EGFR 04/11/2016 41* >90 ml/min/1.73 m2 Final  . Magnesium 04/11/2016 1.7  1.5 - 2.5 mg/dl Final  . WBC 04/11/2016 2.9* 3.9 - 10.3 10e3/uL Final  . NEUT# 04/11/2016 1.9  1.5 - 6.5 10e3/uL Final  . HGB 04/11/2016 10.1* 11.6 - 15.9 g/dL Final  . HCT 04/11/2016 27.7* 34.8 - 46.6 % Final  . Platelets 04/11/2016 123* 145 - 400 10e3/uL Final  . MCV 04/11/2016 78.7* 79.5 - 101.0 fL Final  . MCH 04/11/2016 28.7  25.1 - 34.0 pg Final  . MCHC 04/11/2016 36.5* 31.5 - 36.0 g/dL Final  . RBC 04/11/2016 3.52* 3.70 - 5.45 10e6/uL Final  . RDW 04/11/2016 16.7* 11.2 - 14.5 % Final  . lymph# 04/11/2016 0.6* 0.9 - 3.3 10e3/uL Final  . MONO# 04/11/2016 0.3  0.1 - 0.9 10e3/uL Final  . Eosinophils Absolute 04/11/2016 0.2  0.0 - 0.5 10e3/uL Final  . Basophils Absolute 04/11/2016 0.0  0.0 - 0.1 10e3/uL Final  . NEUT% 04/11/2016 63.8  38.4 - 76.8 % Final  . LYMPH% 04/11/2016 19.0  14.0 - 49.7 % Final  . MONO% 04/11/2016 11.0  0.0 - 14.0 % Final  . EOS% 04/11/2016 5.5  0.0 - 7.0 % Final  . BASO% 04/11/2016 0.7  0.0 - 2.0 % Final  . nRBC 04/11/2016 0  0 - 0 % Final  . Technologist Review 04/11/2016 Few plasmacytoid lymhs   Final    RADIOGRAPHIC STUDIES:  No results found.  ASSESSMENT/PLAN:    Paronychia of right thumb Patient states that she developed edema, warmth and tenderness to the right thumb at the cuticle approximately 24-48 hours ago.  Patient has artificial nails on.  On exam today does appear the patient has a significant paronychia to the right corner of her cuticle.  The area is red, warm, and edematous.  It is also very tender.  She has some trace erythema that radiates to her wrist.  Reviewed all findings with Dr. Alvy Bimler; and she was in agreement that the paronychia should be drained.  Discussed the options of performing a lidocaine nerve block to the finger versus numbing the area with ice-and patient chose to numb the area with ice instead of a lidocaine injections.  The area was then prepped by cleaning with Betadine and then alcohol; and then this provider used an 18-gauge sterile needle to open and drain the site.  Obtained a small amount of yellow purulent fluid from the site.  Patient was advised to soak her finger approximately 3 times per day; and to keep it covered at all other times until it resolves.  Also, will prescribe Keflex antibiotics for patient as well.  Since patient does have  a PEG tube-she stated that she could crush the tablets to place into her PEG.  Patient was advised to call/return or go directly to the emergency department for any worsening symptoms whatsoever.  Also, give patient some supplies and Neosporin to take home as well.    Cancer of anterior two-thirds of tongue [facility]) Patient presented to receive her next cycle of cisplatin chemotherapy regimen.  However, patient has recently been diagnosed with shingles that are slowly resolving; and was diagnosed today with a paronychia.  Per Dr. Gifford Shave hold the cisplatin chemotherapy today as to just give IV fluid rehydration.  Patient also continues with daily radiation treatments.     Patient stated understanding of all instructions; and  was in agreement with this plan of care. The patient knows to call the clinic with any problems, questions or concerns.   Total time spent with patient was 25 minutes;  with greater than 75 percent of that time spent in face to face counseling regarding patient's symptoms,  and coordination of care and follow up.  Disclaimer:This dictation was prepared with Dragon/digital dictation along with [employer]. Any transcriptional errors that result from this process are unintentional.  Drue Second, NP 04/13/2016

## 2016-04-13 NOTE — Progress Notes (Signed)
Paperwork (insurance reimbursement)  received 4/13, given to nurse

## 2016-04-13 NOTE — Assessment & Plan Note (Signed)
Patient presented to receive her next cycle of cisplatin chemotherapy regimen.  However, patient has recently been diagnosed with shingles that are slowly resolving; and was diagnosed today with a paronychia.  Per Dr. Gifford Shave hold the cisplatin chemotherapy today as to just give IV fluid rehydration.  Patient also continues with daily radiation treatments.

## 2016-04-13 NOTE — Progress Notes (Signed)
Chemotherapy held today due to paronychia right thumb. IVF's given per supportive therapy plan. Selena Lesser, NP reviewed discharge instructions with patient. Patient verbalized understanding and agrees with plan of care.

## 2016-04-13 NOTE — Progress Notes (Signed)
Oncology Nurse Navigator Documentation  Received form for travel insurance reimbursement from Ms. Egler 04/12/16.  Delivered copy to RadOnc and MedOnc support services 04/13/16 for completion with request to return to my attention upon completion.  [name], RN, BSN, [place] [facility] Oncology Nurse [place] at [place] [phone]

## 2016-04-14 ENCOUNTER — Ambulatory Visit (HOSPITAL_BASED_OUTPATIENT_CLINIC_OR_DEPARTMENT_OTHER): Payer: 59

## 2016-04-14 VITALS — BP 134/68 | HR 90 | Temp 99.5°F | Resp 18

## 2016-04-14 DIAGNOSIS — C023 Malignant neoplasm of anterior two-thirds of tongue, part unspecified: Secondary | ICD-10-CM | POA: Diagnosis not present

## 2016-04-14 MED ORDER — SODIUM CHLORIDE 0.9% FLUSH
10.0000 mL | INTRAVENOUS | Status: DC | PRN
  Administered 2016-04-14: 10 mL
  Filled 2016-04-14: qty 10

## 2016-04-14 MED ORDER — SODIUM CHLORIDE 0.9 % IV SOLN
Freq: Once | INTRAVENOUS | Status: AC
  Administered 2016-04-14: 09:00:00 via INTRAVENOUS

## 2016-04-14 MED ORDER — HEPARIN SOD (PORK) LOCK FLUSH 100 UNIT/ML IV SOLN
250.0000 [IU] | Freq: Once | INTRAVENOUS | Status: AC | PRN
  Administered 2016-04-14: 250 [IU]
  Filled 2016-04-14: qty 5

## 2016-04-15 MED FILL — PROCHLORPERAZINE 10 MG TAB: 10 | 7 days supply | Qty: 30 | Fill #1

## 2016-04-16 ENCOUNTER — Ambulatory Visit: Payer: 59

## 2016-04-16 ENCOUNTER — Telehealth: Payer: Self-pay | Admitting: *Deleted

## 2016-04-16 ENCOUNTER — Ambulatory Visit: Payer: Self-pay

## 2016-04-16 ENCOUNTER — Ambulatory Visit
Admission: RE | Admit: 2016-04-16 | Discharge: 2016-04-16 | Disposition: A | Discharge status: Home / Self Care | Payer: 59 | Source: Ambulatory Visit | Attending: Radiation Oncology | Admitting: Radiation Oncology

## 2016-04-16 ENCOUNTER — Ambulatory Visit (HOSPITAL_BASED_OUTPATIENT_CLINIC_OR_DEPARTMENT_OTHER): Payer: 59 | Admitting: Nurse Practitioner

## 2016-04-16 ENCOUNTER — Ambulatory Visit: Payer: 59 | Attending: Radiation Oncology

## 2016-04-16 ENCOUNTER — Other Ambulatory Visit: Payer: Self-pay | Admitting: Radiation Oncology

## 2016-04-16 VITALS — BP 135/80 | HR 92 | Temp 99.0°F | Resp 16 | Wt 208.2 lb

## 2016-04-16 DIAGNOSIS — Z510 Encounter for antineoplastic radiation therapy: Secondary | ICD-10-CM | POA: Diagnosis not present

## 2016-04-16 DIAGNOSIS — C023 Malignant neoplasm of anterior two-thirds of tongue, part unspecified: Secondary | ICD-10-CM

## 2016-04-16 MED ORDER — SODIUM CHLORIDE 0.9 % IV SOLN
1000.0000 mL | Freq: Once | INTRAVENOUS | Status: AC
  Administered 2016-04-16: 1000 mL via INTRAVENOUS

## 2016-04-16 MED ORDER — HEPARIN SOD (PORK) LOCK FLUSH 100 UNIT/ML IV SOLN
500.0000 [IU] | Freq: Once | INTRAVENOUS | Status: AC | PRN
Start: 2016-04-16 — End: 2016-04-16
  Administered 2016-04-16: 500 [IU]
  Filled 2016-04-16: qty 5

## 2016-04-16 MED ORDER — SODIUM CHLORIDE 0.9 % IV SOLN
Freq: Once | INTRAVENOUS | Status: DC
  Filled 2016-04-16: qty 4

## 2016-04-16 MED ORDER — ONDANSETRON 8 MG PO TBDP: 8.0000 mg | ORAL_TABLET | Freq: Three times a day (TID) | ORAL | 5 refills | Status: AC | PRN

## 2016-04-16 MED ORDER — ONDANSETRON HCL 4 MG/2ML IJ SOLN
INTRAMUSCULAR | Status: AC
  Filled 2016-04-16: qty 4

## 2016-04-16 MED ORDER — SODIUM CHLORIDE 0.9% FLUSH
10.0000 mL | INTRAVENOUS | Status: DC | PRN
  Administered 2016-04-16: 10 mL
  Filled 2016-04-16: qty 10

## 2016-04-16 MED ORDER — ONDANSETRON HCL 4 MG/2ML IJ SOLN
8.0000 mg | Freq: Once | INTRAMUSCULAR | Status: AC
  Administered 2016-04-16: 8 mg via INTRAVENOUS

## 2016-04-16 MED FILL — ONDANSETRON ODT 8 MG TABLET: 8 | 10 days supply | Qty: 30 | Fill #0

## 2016-04-16 NOTE — Patient Instructions (Signed)
Dehydration, Adult Dehydration is a condition in which there is not enough fluid or water in the body. This happens when you lose more fluids than you take in. Important organs, such as the kidneys, brain, and heart, cannot function without a proper amount of fluids. Any loss of fluids from the body can lead to dehydration. Dehydration can range from mild to severe. This condition should be treated right away to prevent it from becoming severe. What are the causes? This condition may be caused by:  Vomiting.  Diarrhea.  Excessive sweating, such as from heat exposure or exercise.  Not drinking enough fluid, especially:  When ill.  While doing activity that requires a lot of energy.  Excessive urination.  Fever.  Infection.  Certain medicines, such as medicines that cause the body to lose excess fluid (diuretics).  Inability to access safe drinking water.  Reduced physical ability to get adequate water and food. What increases the risk? This condition is more likely to develop in people:  Who have a poorly controlled long-term (chronic) illness, such as diabetes, heart disease, or kidney disease.  Who are age 65 or older.  Who are disabled.  Who live in a place with high altitude.  Who play endurance sports. What are the signs or symptoms? Symptoms of mild dehydration may include:   Thirst.  Dry lips.  Slightly dry mouth.  Dry, warm skin.  Dizziness. Symptoms of moderate dehydration may include:   Very dry mouth.  Muscle cramps.  Dark urine. Urine may be the color of tea.  Decreased urine production.  Decreased tear production.  Heartbeat that is irregular or faster than normal (palpitations).  Headache.  Light-headedness, especially when you stand up from a sitting position.  Fainting (syncope). Symptoms of severe dehydration may include:   Changes in skin, such as:  Cold and clammy skin.  Blotchy (mottled) or pale skin.  Skin that does  not quickly return to normal after being lightly pinched and released (poor skin turgor).  Changes in body fluids, such as:  Extreme thirst.  No tear production.  Inability to sweat when body temperature is high, such as in hot weather.  Very little urine production.  Changes in vital signs, such as:  Weak pulse.  Pulse that is more than 100 beats a minute when sitting still.  Rapid breathing.  Low blood pressure.  Other changes, such as:  Sunken eyes.  Cold hands and feet.  Confusion.  Lack of energy (lethargy).  Difficulty waking up from sleep.  Short-term weight loss.  Unconsciousness. How is this diagnosed? This condition is diagnosed based on your symptoms and a physical exam. Blood and urine tests may be done to help confirm the diagnosis. How is this treated? Treatment for this condition depends on the severity. Mild or moderate dehydration can often be treated at home. Treatment should be started right away. Do not wait until dehydration becomes severe. Severe dehydration is an emergency and it needs to be treated in a hospital. Treatment for mild dehydration may include:   Drinking more fluids.  Replacing salts and minerals in your blood (electrolytes) that you may have lost. Treatment for moderate dehydration may include:   Drinking an oral rehydration solution (ORS). This is a drink that helps you replace fluids and electrolytes (rehydrate). It can be found at pharmacies and retail stores. Treatment for severe dehydration may include:   Receiving fluids through an IV tube.  Receiving an electrolyte solution through a feeding tube that is   passed through your nose and into your stomach (nasogastric tube, or NG tube).  Correcting any abnormalities in electrolytes.  Treating the underlying cause of dehydration. Follow these instructions at home:  If directed by your health care provider, drink an ORS:  Make an ORS by following instructions on the  package.  Start by drinking small amounts, about  cup (120 mL) every 5-10 minutes.  Slowly increase how much you drink until you have taken the amount recommended by your health care provider.  Drink enough clear fluid to keep your urine clear or pale yellow. If you were told to drink an ORS, finish the ORS first, then start slowly drinking other clear fluids. Drink fluids such as:  Water. Do not drink only water. Doing that can lead to having too little salt (sodium) in the body (hyponatremia).  Ice chips.  Fruit juice that you have added water to (diluted fruit juice).  Low-calorie sports drinks.  Avoid:  Alcohol.  Drinks that contain a lot of sugar. These include high-calorie sports drinks, fruit juice that is not diluted, and soda.  Caffeine.  Foods that are greasy or contain a lot of fat or sugar.  Take over-the-counter and prescription medicines only as told by your health care provider.  Do not take sodium tablets. This can lead to having too much sodium in the body (hypernatremia).  Eat foods that contain a healthy balance of electrolytes, such as bananas, oranges, potatoes, tomatoes, and spinach.  Keep all follow-up visits as told by your health care provider. This is important. Contact a health care provider if:  You have abdominal pain that:  Gets worse.  Stays in one area (localizes).  You have a rash.  You have a stiff neck.  You are more irritable than usual.  You are sleepier or more difficult to wake up than usual.  You feel weak or dizzy.  You feel very thirsty.  You have urinated only a small amount of very dark urine over 6-8 hours. Get help right away if:  You have symptoms of severe dehydration.  You cannot drink fluids without vomiting.  Your symptoms get worse with treatment.  You have a fever.  You have a severe headache.  You have vomiting or diarrhea that:  Gets worse.  Does not go away.  You have blood or green matter  (bile) in your vomit.  You have blood in your stool. This may cause stool to look black and tarry.  You have not urinated in 6-8 hours.  You faint.  Your heart rate while sitting still is over 100 beats a minute.  You have trouble breathing. This information is not intended to replace advice given to you by your health care provider. Make sure you discuss any questions you have with your health care provider. Document Released: 12/18/2004 Document Revised: 07/15/2015 Document Reviewed: 02/11/2015 Elsevier Interactive Patient Education  2017 [employer]  

## 2016-04-16 NOTE — Telephone Encounter (Signed)
Oncology Nurse Navigator Documentation  Received call from Ms. Tiffany Yu. She stated she has had a "rough" weekend, has been having dry heaves, very tired, wants to cancel RT today. I acknowledged her request, emphasized importance of coming in to see Tiffany. [name] for her Monday WUT appt and further evaluation.  She agreed to do so. I notified Tomo RTT, Jehna re RT; Tiffany Yu, RNs Tiffany Yu and Tiffany Yu.  Tiffany Orem, RN, BSN, [place] [facility] Oncology Nurse [place] at [place] [phone]

## 2016-04-16 NOTE — Progress Notes (Signed)
RN visit for IV fluids. 

## 2016-04-17 ENCOUNTER — Ambulatory Visit (HOSPITAL_BASED_OUTPATIENT_CLINIC_OR_DEPARTMENT_OTHER): Payer: 59 | Admitting: Nurse Practitioner

## 2016-04-17 ENCOUNTER — Ambulatory Visit
Admission: RE | Admit: 2016-04-17 | Discharge: 2016-04-17 | Disposition: A | Discharge status: Home / Self Care | Payer: 59 | Source: Ambulatory Visit | Attending: Radiation Oncology | Admitting: Radiation Oncology

## 2016-04-17 VITALS — BP 145/50 | HR 96 | Temp 98.7°F | Resp 18 | Ht 67.0 in | Wt 207.9 lb

## 2016-04-17 DIAGNOSIS — C023 Malignant neoplasm of anterior two-thirds of tongue, part unspecified: Secondary | ICD-10-CM

## 2016-04-17 DIAGNOSIS — Z510 Encounter for antineoplastic radiation therapy: Secondary | ICD-10-CM | POA: Diagnosis not present

## 2016-04-17 MED ORDER — HEPARIN SOD (PORK) LOCK FLUSH 100 UNIT/ML IV SOLN
500.0000 [IU] | Freq: Once | INTRAVENOUS | Status: AC | PRN
  Administered 2016-04-17: 500 [IU]
  Filled 2016-04-17: qty 5

## 2016-04-17 MED ORDER — SODIUM CHLORIDE 0.9% FLUSH
10.0000 mL | INTRAVENOUS | Status: DC | PRN
  Administered 2016-04-17: 10 mL
  Filled 2016-04-17: qty 10

## 2016-04-17 MED ORDER — ONDANSETRON HCL 4 MG/2ML IJ SOLN
8.0000 mg | Freq: Once | INTRAMUSCULAR | Status: AC
  Administered 2016-04-17: 8 mg via INTRAVENOUS

## 2016-04-17 MED ORDER — ONDANSETRON HCL 4 MG/2ML IJ SOLN
INTRAMUSCULAR | Status: AC
  Filled 2016-04-17: qty 4

## 2016-04-17 MED ORDER — SODIUM CHLORIDE 0.9 % IV SOLN
Freq: Once | INTRAVENOUS | Status: AC
  Administered 2016-04-17: 11:00:00 via INTRAVENOUS

## 2016-04-17 MED ORDER — SODIUM CHLORIDE 0.9 % IV SOLN: Freq: Once | INTRAVENOUS | Status: DC

## 2016-04-17 NOTE — Patient Instructions (Signed)
Dehydration, Adult Dehydration is a condition in which there is not enough fluid or water in the body. This happens when you lose more fluids than you take in. Important organs, such as the kidneys, brain, and heart, cannot function without a proper amount of fluids. Any loss of fluids from the body can lead to dehydration. Dehydration can range from mild to severe. This condition should be treated right away to prevent it from becoming severe. What are the causes? This condition may be caused by:  Vomiting.  Diarrhea.  Excessive sweating, such as from heat exposure or exercise.  Not drinking enough fluid, especially:  When ill.  While doing activity that requires a lot of energy.  Excessive urination.  Fever.  Infection.  Certain medicines, such as medicines that cause the body to lose excess fluid (diuretics).  Inability to access safe drinking water.  Reduced physical ability to get adequate water and food. What increases the risk? This condition is more likely to develop in people:  Who have a poorly controlled long-term (chronic) illness, such as diabetes, heart disease, or kidney disease.  Who are age 65 or older.  Who are disabled.  Who live in a place with high altitude.  Who play endurance sports. What are the signs or symptoms? Symptoms of mild dehydration may include:   Thirst.  Dry lips.  Slightly dry mouth.  Dry, warm skin.  Dizziness. Symptoms of moderate dehydration may include:   Very dry mouth.  Muscle cramps.  Dark urine. Urine may be the color of tea.  Decreased urine production.  Decreased tear production.  Heartbeat that is irregular or faster than normal (palpitations).  Headache.  Light-headedness, especially when you stand up from a sitting position.  Fainting (syncope). Symptoms of severe dehydration may include:   Changes in skin, such as:  Cold and clammy skin.  Blotchy (mottled) or pale skin.  Skin that does  not quickly return to normal after being lightly pinched and released (poor skin turgor).  Changes in body fluids, such as:  Extreme thirst.  No tear production.  Inability to sweat when body temperature is high, such as in hot weather.  Very little urine production.  Changes in vital signs, such as:  Weak pulse.  Pulse that is more than 100 beats a minute when sitting still.  Rapid breathing.  Low blood pressure.  Other changes, such as:  Sunken eyes.  Cold hands and feet.  Confusion.  Lack of energy (lethargy).  Difficulty waking up from sleep.  Short-term weight loss.  Unconsciousness. How is this diagnosed? This condition is diagnosed based on your symptoms and a physical exam. Blood and urine tests may be done to help confirm the diagnosis. How is this treated? Treatment for this condition depends on the severity. Mild or moderate dehydration can often be treated at home. Treatment should be started right away. Do not wait until dehydration becomes severe. Severe dehydration is an emergency and it needs to be treated in a hospital. Treatment for mild dehydration may include:   Drinking more fluids.  Replacing salts and minerals in your blood (electrolytes) that you may have lost. Treatment for moderate dehydration may include:   Drinking an oral rehydration solution (ORS). This is a drink that helps you replace fluids and electrolytes (rehydrate). It can be found at pharmacies and retail stores. Treatment for severe dehydration may include:   Receiving fluids through an IV tube.  Receiving an electrolyte solution through a feeding tube that is   passed through your nose and into your stomach (nasogastric tube, or NG tube).  Correcting any abnormalities in electrolytes.  Treating the underlying cause of dehydration. Follow these instructions at home:  If directed by your health care provider, drink an ORS:  Make an ORS by following instructions on the  package.  Start by drinking small amounts, about  cup (120 mL) every 5-10 minutes.  Slowly increase how much you drink until you have taken the amount recommended by your health care provider.  Drink enough clear fluid to keep your urine clear or pale yellow. If you were told to drink an ORS, finish the ORS first, then start slowly drinking other clear fluids. Drink fluids such as:  Water. Do not drink only water. Doing that can lead to having too little salt (sodium) in the body (hyponatremia).  Ice chips.  Fruit juice that you have added water to (diluted fruit juice).  Low-calorie sports drinks.  Avoid:  Alcohol.  Drinks that contain a lot of sugar. These include high-calorie sports drinks, fruit juice that is not diluted, and soda.  Caffeine.  Foods that are greasy or contain a lot of fat or sugar.  Take over-the-counter and prescription medicines only as told by your health care provider.  Do not take sodium tablets. This can lead to having too much sodium in the body (hypernatremia).  Eat foods that contain a healthy balance of electrolytes, such as bananas, oranges, potatoes, tomatoes, and spinach.  Keep all follow-up visits as told by your health care provider. This is important. Contact a health care provider if:  You have abdominal pain that:  Gets worse.  Stays in one area (localizes).  You have a rash.  You have a stiff neck.  You are more irritable than usual.  You are sleepier or more difficult to wake up than usual.  You feel weak or dizzy.  You feel very thirsty.  You have urinated only a small amount of very dark urine over 6-8 hours. Get help right away if:  You have symptoms of severe dehydration.  You cannot drink fluids without vomiting.  Your symptoms get worse with treatment.  You have a fever.  You have a severe headache.  You have vomiting or diarrhea that:  Gets worse.  Does not go away.  You have blood or green matter  (bile) in your vomit.  You have blood in your stool. This may cause stool to look black and tarry.  You have not urinated in 6-8 hours.  You faint.  Your heart rate while sitting still is over 100 beats a minute.  You have trouble breathing. This information is not intended to replace advice given to you by your health care provider. Make sure you discuss any questions you have with your health care provider. Document Released: 12/18/2004 Document Revised: 07/15/2015 Document Reviewed: 02/11/2015 Elsevier Interactive Patient Education  2017 [employer]  

## 2016-04-17 NOTE — Progress Notes (Signed)
RN visit for IV fluids. 

## 2016-04-18 ENCOUNTER — Encounter: Payer: Self-pay | Admitting: *Deleted

## 2016-04-18 ENCOUNTER — Ambulatory Visit (HOSPITAL_BASED_OUTPATIENT_CLINIC_OR_DEPARTMENT_OTHER): Payer: 59 | Admitting: Nurse Practitioner

## 2016-04-18 ENCOUNTER — Ambulatory Visit
Admission: RE | Admit: 2016-04-18 | Discharge: 2016-04-18 | Disposition: A | Discharge status: Home / Self Care | Payer: 59 | Source: Ambulatory Visit | Attending: Radiation Oncology | Admitting: Radiation Oncology

## 2016-04-18 ENCOUNTER — Other Ambulatory Visit (HOSPITAL_BASED_OUTPATIENT_CLINIC_OR_DEPARTMENT_OTHER): Payer: 59

## 2016-04-18 VITALS — BP 126/72 | HR 84 | Temp 99.0°F | Resp 18 | Ht 67.0 in | Wt 205.0 lb

## 2016-04-18 DIAGNOSIS — R112 Nausea with vomiting, unspecified: Secondary | ICD-10-CM

## 2016-04-18 DIAGNOSIS — C023 Malignant neoplasm of anterior two-thirds of tongue, part unspecified: Secondary | ICD-10-CM

## 2016-04-18 DIAGNOSIS — Z510 Encounter for antineoplastic radiation therapy: Secondary | ICD-10-CM | POA: Diagnosis not present

## 2016-04-18 LAB — COMPREHENSIVE METABOLIC PANEL
ALT: 15 U/L (ref 0–55)
ANION GAP: 12 meq/L — AB (ref 3–11)
AST: 25 U/L (ref 5–34)
Albumin: 3.4 g/dL — ABNORMAL LOW (ref 3.5–5.0)
Alkaline Phosphatase: 122 U/L (ref 40–150)
BILIRUBIN TOTAL: 0.68 mg/dL (ref 0.20–1.20)
BUN: 13.1 mg/dL (ref 7.0–26.0)
CO2: 25 meq/L (ref 22–29)
CREATININE: 1.3 mg/dL — AB (ref 0.6–1.1)
Calcium: 9.2 mg/dL (ref 8.4–10.4)
Chloride: 103 mEq/L (ref 98–109)
EGFR: 49 mL/min/{1.73_m2} — ABNORMAL LOW (ref >90)
GLUCOSE: 81 mg/dL (ref 70–140)
Potassium: 3.9 mEq/L (ref 3.5–5.1)
SODIUM: 140 meq/L (ref 136–145)
TOTAL PROTEIN: 7.1 g/dL (ref 6.4–8.3)

## 2016-04-18 LAB — CBC WITH DIFFERENTIAL/PLATELET
BASO%: 0.3 % (ref 0.0–2.0)
BASOS ABS: 0 10*3/uL (ref 0.0–0.1)
EOS ABS: 0.1 10*3/uL (ref 0.0–0.5)
EOS%: 1.9 % (ref 0.0–7.0)
HEMATOCRIT: 31.3 % — AB (ref 34.8–46.6)
HGB: 11.2 g/dL — ABNORMAL LOW (ref 11.6–15.9)
LYMPH%: 9.3 % — AB (ref 14.0–49.7)
MCH: 28.5 pg (ref 25.1–34.0)
MCHC: 35.8 g/dL (ref 31.5–36.0)
MCV: 79.6 fL (ref 79.5–101.0)
MONO#: 0.5 10*3/uL (ref 0.1–0.9)
MONO%: 15.8 % — ABNORMAL HIGH (ref 0.0–14.0)
NEUT#: 2.4 10*3/uL (ref 1.5–6.5)
NEUT%: 72.7 % (ref 38.4–76.8)
PLATELETS: 129 10*3/uL — AB (ref 145–400)
RBC: 3.93 10*6/uL (ref 3.70–5.45)
RDW: 16.9 % — AB (ref 11.2–14.5)
WBC: 3.2 10*3/uL — ABNORMAL LOW (ref 3.9–10.3)
lymph#: 0.3 10*3/uL — ABNORMAL LOW (ref 0.9–3.3)

## 2016-04-18 LAB — MAGNESIUM: Magnesium: 1.8 mg/dl (ref 1.5–2.5)

## 2016-04-18 MED ORDER — ONDANSETRON HCL 4 MG/2ML IJ SOLN
INTRAMUSCULAR | Status: AC
  Filled 2016-04-18: qty 4

## 2016-04-18 MED ORDER — SODIUM CHLORIDE 0.9% FLUSH
10.0000 mL | INTRAVENOUS | Status: DC | PRN
  Administered 2016-04-18: 10 mL
  Filled 2016-04-18: qty 10

## 2016-04-18 MED ORDER — ONDANSETRON HCL 4 MG/2ML IJ SOLN
8.0000 mg | Freq: Once | INTRAMUSCULAR | Status: AC
  Administered 2016-04-18: 8 mg via INTRAVENOUS

## 2016-04-18 MED ORDER — SODIUM CHLORIDE 0.9 % IV SOLN
Freq: Once | INTRAVENOUS | Status: AC
  Administered 2016-04-18: 11:00:00 via INTRAVENOUS

## 2016-04-18 MED ORDER — SODIUM CHLORIDE 0.9 % IV SOLN: Freq: Once | INTRAVENOUS | Status: DC

## 2016-04-18 MED ORDER — HEPARIN SOD (PORK) LOCK FLUSH 100 UNIT/ML IV SOLN
500.0000 [IU] | Freq: Once | INTRAVENOUS | Status: AC | PRN
  Administered 2016-04-18: 500 [IU]
  Filled 2016-04-18: qty 5

## 2016-04-18 NOTE — Progress Notes (Signed)
Oncology Nurse Navigator Documentation  Visited Tiffany Yu in [facility] where she was receiving scheduled IVF to check on her well-being. She indicated she is feeling better since Monday though expressed concern re facial swelling.  She understands from discussion with Dr. Isidore Moos swelling will resolve following completion of RT. She provided me FMLA paperwork for her daughter to be completed by Drs Isidore Moos and Alvy Bimler.  I delivered to [name], RadOnc and MedOnc Support services the next morning.  [name], RN, BSN, [place] [facility] Oncology Nurse [place] at [place] [phone]

## 2016-04-18 NOTE — Progress Notes (Signed)
RN visit for IV fluids. 

## 2016-04-18 NOTE — Patient Instructions (Signed)
Dehydration, Adult Dehydration is a condition in which there is not enough fluid or water in the body. This happens when you lose more fluids than you take in. Important organs, such as the kidneys, brain, and heart, cannot function without a proper amount of fluids. Any loss of fluids from the body can lead to dehydration. Dehydration can range from mild to severe. This condition should be treated right away to prevent it from becoming severe. What are the causes? This condition may be caused by:  Vomiting.  Diarrhea.  Excessive sweating, such as from heat exposure or exercise.  Not drinking enough fluid, especially:  When ill.  While doing activity that requires a lot of energy.  Excessive urination.  Fever.  Infection.  Certain medicines, such as medicines that cause the body to lose excess fluid (diuretics).  Inability to access safe drinking water.  Reduced physical ability to get adequate water and food. What increases the risk? This condition is more likely to develop in people:  Who have a poorly controlled long-term (chronic) illness, such as diabetes, heart disease, or kidney disease.  Who are age 65 or older.  Who are disabled.  Who live in a place with high altitude.  Who play endurance sports. What are the signs or symptoms? Symptoms of mild dehydration may include:   Thirst.  Dry lips.  Slightly dry mouth.  Dry, warm skin.  Dizziness. Symptoms of moderate dehydration may include:   Very dry mouth.  Muscle cramps.  Dark urine. Urine may be the color of tea.  Decreased urine production.  Decreased tear production.  Heartbeat that is irregular or faster than normal (palpitations).  Headache.  Light-headedness, especially when you stand up from a sitting position.  Fainting (syncope). Symptoms of severe dehydration may include:   Changes in skin, such as:  Cold and clammy skin.  Blotchy (mottled) or pale skin.  Skin that does  not quickly return to normal after being lightly pinched and released (poor skin turgor).  Changes in body fluids, such as:  Extreme thirst.  No tear production.  Inability to sweat when body temperature is high, such as in hot weather.  Very little urine production.  Changes in vital signs, such as:  Weak pulse.  Pulse that is more than 100 beats a minute when sitting still.  Rapid breathing.  Low blood pressure.  Other changes, such as:  Sunken eyes.  Cold hands and feet.  Confusion.  Lack of energy (lethargy).  Difficulty waking up from sleep.  Short-term weight loss.  Unconsciousness. How is this diagnosed? This condition is diagnosed based on your symptoms and a physical exam. Blood and urine tests may be done to help confirm the diagnosis. How is this treated? Treatment for this condition depends on the severity. Mild or moderate dehydration can often be treated at home. Treatment should be started right away. Do not wait until dehydration becomes severe. Severe dehydration is an emergency and it needs to be treated in a hospital. Treatment for mild dehydration may include:   Drinking more fluids.  Replacing salts and minerals in your blood (electrolytes) that you may have lost. Treatment for moderate dehydration may include:   Drinking an oral rehydration solution (ORS). This is a drink that helps you replace fluids and electrolytes (rehydrate). It can be found at pharmacies and retail stores. Treatment for severe dehydration may include:   Receiving fluids through an IV tube.  Receiving an electrolyte solution through a feeding tube that is   passed through your nose and into your stomach (nasogastric tube, or NG tube).  Correcting any abnormalities in electrolytes.  Treating the underlying cause of dehydration. Follow these instructions at home:  If directed by your health care provider, drink an ORS:  Make an ORS by following instructions on the  package.  Start by drinking small amounts, about  cup (120 mL) every 5-10 minutes.  Slowly increase how much you drink until you have taken the amount recommended by your health care provider.  Drink enough clear fluid to keep your urine clear or pale yellow. If you were told to drink an ORS, finish the ORS first, then start slowly drinking other clear fluids. Drink fluids such as:  Water. Do not drink only water. Doing that can lead to having too little salt (sodium) in the body (hyponatremia).  Ice chips.  Fruit juice that you have added water to (diluted fruit juice).  Low-calorie sports drinks.  Avoid:  Alcohol.  Drinks that contain a lot of sugar. These include high-calorie sports drinks, fruit juice that is not diluted, and soda.  Caffeine.  Foods that are greasy or contain a lot of fat or sugar.  Take over-the-counter and prescription medicines only as told by your health care provider.  Do not take sodium tablets. This can lead to having too much sodium in the body (hypernatremia).  Eat foods that contain a healthy balance of electrolytes, such as bananas, oranges, potatoes, tomatoes, and spinach.  Keep all follow-up visits as told by your health care provider. This is important. Contact a health care provider if:  You have abdominal pain that:  Gets worse.  Stays in one area (localizes).  You have a rash.  You have a stiff neck.  You are more irritable than usual.  You are sleepier or more difficult to wake up than usual.  You feel weak or dizzy.  You feel very thirsty.  You have urinated only a small amount of very dark urine over 6-8 hours. Get help right away if:  You have symptoms of severe dehydration.  You cannot drink fluids without vomiting.  Your symptoms get worse with treatment.  You have a fever.  You have a severe headache.  You have vomiting or diarrhea that:  Gets worse.  Does not go away.  You have blood or green matter  (bile) in your vomit.  You have blood in your stool. This may cause stool to look black and tarry.  You have not urinated in 6-8 hours.  You faint.  Your heart rate while sitting still is over 100 beats a minute.  You have trouble breathing. This information is not intended to replace advice given to you by your health care provider. Make sure you discuss any questions you have with your health care provider. Document Released: 12/18/2004 Document Revised: 07/15/2015 Document Reviewed: 02/11/2015 Elsevier Interactive Patient Education  2017 [employer]  

## 2016-04-19 ENCOUNTER — Ambulatory Visit (HOSPITAL_BASED_OUTPATIENT_CLINIC_OR_DEPARTMENT_OTHER): Payer: 59 | Admitting: Hematology and Oncology

## 2016-04-19 ENCOUNTER — Encounter: Payer: Self-pay | Admitting: Hematology and Oncology

## 2016-04-19 ENCOUNTER — Encounter: Payer: Self-pay | Admitting: Radiation Oncology

## 2016-04-19 ENCOUNTER — Ambulatory Visit
Admission: RE | Admit: 2016-04-19 | Discharge: 2016-04-19 | Disposition: A | Discharge status: Home / Self Care | Payer: 59 | Source: Ambulatory Visit | Attending: Radiation Oncology | Admitting: Radiation Oncology

## 2016-04-19 ENCOUNTER — Ambulatory Visit (HOSPITAL_BASED_OUTPATIENT_CLINIC_OR_DEPARTMENT_OTHER): Payer: 59 | Admitting: Nurse Practitioner

## 2016-04-19 VITALS — BP 132/74 | HR 83

## 2016-04-19 DIAGNOSIS — N179 Acute kidney failure, unspecified: Secondary | ICD-10-CM

## 2016-04-19 DIAGNOSIS — G893 Neoplasm related pain (acute) (chronic): Secondary | ICD-10-CM

## 2016-04-19 DIAGNOSIS — C023 Malignant neoplasm of anterior two-thirds of tongue, part unspecified: Secondary | ICD-10-CM

## 2016-04-19 DIAGNOSIS — D61818 Other pancytopenia: Secondary | ICD-10-CM | POA: Diagnosis not present

## 2016-04-19 DIAGNOSIS — K117 Disturbances of salivary secretion: Secondary | ICD-10-CM

## 2016-04-19 DIAGNOSIS — R112 Nausea with vomiting, unspecified: Secondary | ICD-10-CM | POA: Diagnosis not present

## 2016-04-19 DIAGNOSIS — B029 Zoster without complications: Secondary | ICD-10-CM

## 2016-04-19 DIAGNOSIS — Z510 Encounter for antineoplastic radiation therapy: Secondary | ICD-10-CM | POA: Diagnosis not present

## 2016-04-19 MED ORDER — SODIUM CHLORIDE 0.9% FLUSH
10.0000 mL | INTRAVENOUS | Status: DC | PRN
  Administered 2016-04-19: 10 mL
  Filled 2016-04-19: qty 10

## 2016-04-19 MED ORDER — LORAZEPAM 1 MG PO TABS: 1.0000 mg | ORAL_TABLET | Freq: Three times a day (TID) | ORAL | 0 refills | Status: DC | PRN

## 2016-04-19 MED ORDER — HEPARIN SOD (PORK) LOCK FLUSH 100 UNIT/ML IV SOLN
500.0000 [IU] | Freq: Once | INTRAVENOUS | Status: AC | PRN
  Administered 2016-04-19: 500 [IU]
  Filled 2016-04-19: qty 5

## 2016-04-19 MED ORDER — SODIUM CHLORIDE 0.9 % IV SOLN: Freq: Once | INTRAVENOUS | Status: DC

## 2016-04-19 MED ORDER — ONDANSETRON HCL 4 MG/2ML IJ SOLN
INTRAMUSCULAR | Status: AC
  Filled 2016-04-19: qty 4

## 2016-04-19 MED ORDER — ONDANSETRON HCL 4 MG/2ML IJ SOLN
8.0000 mg | Freq: Once | INTRAMUSCULAR | Status: AC
  Administered 2016-04-19: 8 mg via INTRAVENOUS

## 2016-04-19 MED ORDER — SCOPOLAMINE 1 MG/3DAYS TD PT72: 1.0000 | MEDICATED_PATCH | TRANSDERMAL | 12 refills | Status: DC

## 2016-04-19 MED ORDER — SODIUM CHLORIDE 0.9 % IV SOLN
Freq: Once | INTRAVENOUS | Status: AC
  Administered 2016-04-19: 11:00:00 via INTRAVENOUS

## 2016-04-19 MED FILL — LORazepam 1 MG TABS: 1 | 10 days supply | Qty: 30 | Fill #0

## 2016-04-19 MED FILL — TRANSDERM-SCOP 1.5 MG/3 DAY: 1 | 30 days supply | Qty: 10 | Fill #0

## 2016-04-19 NOTE — Assessment & Plan Note (Signed)
She has poorly controlled nausea I recommend trial of scopolamine patch

## 2016-04-19 NOTE — Progress Notes (Signed)
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 Tongue, biopsy, Anterior deep margin left SQUAMOUS CELL CARCINOMA 3. Tongue, biopsy, Posterior deep margin left NEGATIVE FOR CARCINOMA 4. Tongue, biopsy, Posterior margin left SQUAMOUS CELL CARCINOMA 5. Tongue, excisional biopsy, New anterior deep margin NEGATIVE FOR CARCINOMA 6. Tongue, excisional biopsy, New posterior deep margin SMALL FOCUS OF SQUAMOUS CELL CARCINOMA (0.1 CM, ONLY PRESENT AT THE DEEPER PERMINENT SECTION) 7. Tongue, resection for tumor, Left lateral INFILTRATIVE KERATINIZED SQUAMOUS CELL CARCINOMA (3.2 CM) THE TUMOR INVADES UNDERNEATH MUSCLE OF THE TONGUE (2.0 CM, PT2) LYMPHOVASCULAR AND PERINEURAL INVASION IDENTIFIED SQUAMOUS CELL CARCINOMA PRESENTED AT (FINAL MARGINS REFER TO PART 2-6) LATERAL INKED MARGIN (1.0 CM) MEDIAL INKED MARGIN (0.6 CM)      02/08/2016 Surgery    SURGICAL PROCEDURES: 1. Left hemiglossectomy with primary closure. 2. Left selective neck dissection (zones I-III).      03/02/2016 Imaging    CT chest with contrast: No evidence of metastatic disease in the chest. 2. Aortic atherosclerosis.       03/02/2016 Imaging    CT neck with contrast showed : Newly enlarged and round 10 mm right  level 1b lymph node (series 5, image 45). The level 1A nodes remain normal. This right 1 B node is nonspecific but suspicious for progressive nodal metastatic disease. It might be amenable to Ultrasound-guided FNA. 2. Interval left hemiglossectomy and selective left neck dissection with confluent indeterminate soft tissue from the anterior left submandibular space through the left carotid space and obscuring the left level 1B, level 2, and level IIIa nodal stations. This study will serve as a new postoperative baseline of this area. Attention directed on close interval follow-up. 3. Mild soft tissue thickening also along the left lateral tongue resection margin. Attention directed on followup. 4. Chest CT today reported separately      03/14/2016 Procedure    She has normal baseline hearing test      03/15/2016 Procedure    1. Successful placement of a right internal jugular approach power injectable Port-A-Cath. The Port a catheter is ready for immediate use. 2. Successful fluoroscopic insertion of a 20-French pull-through gastrostomy tube. The gastrostomy may be used immediately for medication administration and may be utilized in 24 hrs for the initiation of feeds.      03/15/2016 -  Radiation Therapy    She received concurrent radiation treatment      03/16/2016 -  Chemotherapy    She received weekly cisplatin       04/04/2016 Adverse Reaction    Chemotherapy is placed on hold due to acute renal failure      04/13/2016 Adverse Reaction    Chemo was placed on hold due to shingles outbreak and paronychia       INTERVAL HISTORY: Please see below for problem oriented charting. She returns prior to chemotherapy The new  infection had resolved The skin rash from shingles are almost completely healed She complained of excessive mucus production causing gagging, nausea and vomiting She is able to maintain her weight She denies recent infection, fever or chills No peripheral  neuropathy. Her pain appears to be well controlled with current prescription pain medicine She denies constipation  REVIEW OF SYSTEMS:   Constitutional: Denies fevers, chills or abnormal weight loss Eyes: Denies blurriness of vision Respiratory: Denies cough, dyspnea or wheezes Cardiovascular: Denies palpitation, chest discomfort or lower extremity swelling Skin: Denies abnormal skin rashes Lymphatics: Denies new lymphadenopathy or easy bruising Neurological:Denies numbness, tingling or new weaknesses Behavioral/Psych: Mood is stable, no new changes  All other systems were reviewed with the patient and are negative.  I have reviewed the past medical history, past surgical history, social history and family history with the patient and they are unchanged from previous note.  ALLERGIES:  has No Known Allergies.  MEDICATIONS:  Current Outpatient Prescriptions  Medication Sig Dispense Refill  . cephALEXin (KEFLEX) 250 MG/5ML suspension Place 10 mLs (500 mg total) into feeding tube 3 (three) times daily. 300 mL 0  . chlorhexidine (PERIDEX) 0.12 % solution Rinse with 15 mls three times daily for 30 seconds. Use after breakfast, dinner, and at bedtime. Spit out excess. Do not swallow. 480 mL prn  . fentaNYL (DURAGESIC - DOSED MCG/HR) 25 MCG/HR patch Place 1 patch (25 mcg total) onto the skin every 3 (three) days. 5 patch 0  . gabapentin (NEURONTIN) 300 MG capsule Take 1 capsule (300 mg total) by mouth 2 (two) times daily as needed. 28 capsule 0  . levothyroxine (SYNTHROID, LEVOTHROID) 75 MCG tablet Take 75 mcg by mouth daily before breakfast.     . lidocaine (XYLOCAINE) 2 % solution Patient: Mix 1part 2% viscous lidocaine, 1part H20. Swish and swallow 61mL of this mixture, 34min before meals and at bedtime, up to QID 100 mL 5  . lidocaine (XYLOCAINE) 5 % ointment Apply 1 application topically as needed. Around feeding tube area for severe pain 50 g 0  . lidocaine-prilocaine (EMLA) cream Apply  to affected area once 30 g 3  . Nutritional Supplements (FEEDING SUPPLEMENT, OSMOLITE 1.5 CAL,) LIQD Begin Osmolite 1.5 via PEG, 1 can QID with 60 mL free water before and after. Increase to goal rate of 2 cans BID and 1.5 cans BID via PEG. Give additional 240 mL free water TID between feedings. Please send formula and supplies. 1659 mL 0  . ondansetron (ZOFRAN ODT) 8 MG disintegrating tablet Take 1 tablet (8 mg total) by mouth every 8 (eight) hours as needed for nausea or vomiting. 30 tablet 5  . prochlorperazine (COMPAZINE) 10 MG tablet Take 1 tablet (10 mg total) by mouth every 6 (six) hours as needed (Nausea or vomiting). 30 tablet 1  . senna (SENOKOT) 8.6 MG tablet Take 1 tablet by mouth 2 (two) times daily.    Marland Kitchen triamcinolone (KENALOG) 0.1 % paste APPLY TO AFFECTED AREA    . ALPRAZolam (XANAX) 0.5 MG tablet Take 0.5 mg by mouth at bedtime as needed for sleep.     Marland Kitchen atorvastatin (LIPITOR) 10 MG tablet Take 10 mg by mouth daily at 6 PM.     . buPROPion (ZYBAN) 150 MG 12 hr tablet Take 150 mg by mouth daily.     Marland Kitchen HYDROcodone-acetaminophen (HYCET) 7.5-325 mg/15 ml solution Take 10-15 mLs by mouth every 4 (four) hours as needed for moderate pain. (Patient not taking: Reported on 04/02/2016) 473 mL  0  . LORazepam (ATIVAN) 1 MG tablet Take 1 tablet (1 mg total) by mouth every 8 (eight) hours as needed for anxiety or sleep (nausea). 30 tablet 0  . Morphine Sulfate (MORPHINE CONCENTRATE) 10 mg / 0.5 ml concentrated solution Take 0.5 mLs (10 mg total) by mouth every 2 (two) hours as needed for severe pain. (Patient not taking: Reported on 04/19/2016) 120 mL 0  . ondansetron (ZOFRAN) 8 MG tablet Take 1 tablet (8 mg total) by mouth 2 (two) times daily as needed. Start on the third day after chemotherapy. (Patient not taking: Reported on 04/19/2016) 30 tablet 1  . scopolamine (TRANSDERM-SCOP) 1 MG/3DAYS Place 1 patch (1.5 mg total) onto the skin every 3 (three) days. 10 patch 12  . zolpidem (AMBIEN) 10 MG tablet  Take 10 mg by mouth at bedtime.      No current facility-administered medications for this visit.     PHYSICAL EXAMINATION: ECOG PERFORMANCE STATUS: 1 - Symptomatic but completely ambulatory  Vitals:   04/19/16 1025  BP: 117/60  Pulse: 91  Resp: 18  Temp: 98.9 F (37.2 C)   Filed Weights   04/19/16 1025  Weight: 206 lb 9.6 oz (93.7 kg)    GENERAL:alert, no distress and comfortable SKIN: Skin rash from shingles has almost completely resolved EYES: normal, Conjunctiva are pink and non-injected, sclera clear OROPHARYNX: Noted mild mucositis.  No thrush NECK: supple, thyroid normal size, non-tender, without nodularity LYMPH:  no palpable lymphadenopathy in the cervical, axillary or inguinal LUNGS: clear to auscultation and percussion with normal breathing effort HEART: regular rate & rhythm and no murmurs and no lower extremity edema ABDOMEN:abdomen soft, non-tender and normal bowel sounds.  Feeding tube site looks okay Musculoskeletal:no cyanosis of digits and no clubbing  NEURO: alert & oriented x 3 with fluent speech, no focal motor/sensory deficits  LABORATORY DATA:  I have reviewed the data as listed    Component Value Date/Time   NA 140 04/18/2016 1102   K 3.9 04/18/2016 1102   CL 99 (L) 02/07/2016 0836   CO2 25 04/18/2016 1102   GLUCOSE 81 04/18/2016 1102   BUN 13.1 04/18/2016 1102   CREATININE 1.3 (H) 04/18/2016 1102   CALCIUM 9.2 04/18/2016 1102   PROT 7.1 04/18/2016 1102   ALBUMIN 3.4 (L) 04/18/2016 1102   AST 25 04/18/2016 1102   ALT 15 04/18/2016 1102   ALKPHOS 122 04/18/2016 1102   BILITOT 0.68 04/18/2016 1102   GFRNONAA 45 (L) 02/08/2016 1859   GFRAA 52 (L) 02/08/2016 1859    No results found for: SPEP, UPEP  Lab Results  Component Value Date   WBC 3.2 (L) 04/18/2016   NEUTROABS 2.4 04/18/2016   HGB 11.2 (L) 04/18/2016   HCT 31.3 (L) 04/18/2016   MCV 79.6 04/18/2016   PLT 129 (L) 04/18/2016      Chemistry      Component Value Date/Time    NA 140 04/18/2016 1102   K 3.9 04/18/2016 1102   CL 99 (L) 02/07/2016 0836   CO2 25 04/18/2016 1102   BUN 13.1 04/18/2016 1102   CREATININE 1.3 (H) 04/18/2016 1102      Component Value Date/Time   CALCIUM 9.2 04/18/2016 1102   ALKPHOS 122 04/18/2016 1102   AST 25 04/18/2016 1102   ALT 15 04/18/2016 1102   BILITOT 0.68 04/18/2016 1102       ASSESSMENT & PLAN:  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   I will resume chemotherapy this week with dose modification She will continue blood draw weekly I will continue to see her weekly for supportive care  Pancytopenia, acquired [facility]) This is likely due to side effects of treatment. She is not symptomatic. She does not need blood transfusion. We will continue dose modification as above  Acute renal failure ([place]) This is secondary to poor oral intake, dehydration and recent chemotherapy. With aggressive IV fluid resuscitation, her serum creatinine is improving. She will resume chemotherapy tomorrow with 50% dose adjustment  Shingles rash She has recent shingles outbreak and is prescribed medication for this. The blisters are gone I recommend placing her on isolation while receiving chemotherapy tomorrow  Cancer associated pain She has adequate pain control I advise her to use pain medications as needed  Nausea with vomiting She has poorly controlled nausea I recommend trial of scopolamine patch  Continuous salivary secretion She has excessive mucus production causing gagging and nausea I recommend trial of scopolamine patch as above   No orders of the defined types were placed in this encounter.  All questions were answered. The patient knows to call the clinic with any problems, questions or concerns. No barriers to learning was detected. I spent 25 minutes counseling the patient face to face. The total time spent in the  appointment was 30 minutes and more than 50% was on counseling and review of test results     [name], MD 04/19/2016 4:28 PM

## 2016-04-19 NOTE — Assessment & Plan Note (Signed)
She has excessive mucus production causing gagging and nausea I recommend trial of scopolamine patch as above

## 2016-04-19 NOTE — Assessment & Plan Note (Signed)
This is secondary to poor oral intake, dehydration and recent chemotherapy. With aggressive IV fluid resuscitation, her serum creatinine is improving. She will resume chemotherapy tomorrow with 50% dose adjustment

## 2016-04-19 NOTE — Progress Notes (Signed)
Paperwork (FMLA, for daughter) received 4/19, given to nurse

## 2016-04-19 NOTE — Assessment & Plan Note (Signed)
She has adequate pain control I advise her to use pain medications as needed

## 2016-04-19 NOTE — Patient Instructions (Signed)
Dehydration, Adult Dehydration is a condition in which there is not enough fluid or water in the body. This happens when you lose more fluids than you take in. Important organs, such as the kidneys, brain, and heart, cannot function without a proper amount of fluids. Any loss of fluids from the body can lead to dehydration. Dehydration can range from mild to severe. This condition should be treated right away to prevent it from becoming severe. What are the causes? This condition may be caused by:  Vomiting.  Diarrhea.  Excessive sweating, such as from heat exposure or exercise.  Not drinking enough fluid, especially:  When ill.  While doing activity that requires a lot of energy.  Excessive urination.  Fever.  Infection.  Certain medicines, such as medicines that cause the body to lose excess fluid (diuretics).  Inability to access safe drinking water.  Reduced physical ability to get adequate water and food. What increases the risk? This condition is more likely to develop in people:  Who have a poorly controlled long-term (chronic) illness, such as diabetes, heart disease, or kidney disease.  Who are age 65 or older.  Who are disabled.  Who live in a place with high altitude.  Who play endurance sports. What are the signs or symptoms? Symptoms of mild dehydration may include:   Thirst.  Dry lips.  Slightly dry mouth.  Dry, warm skin.  Dizziness. Symptoms of moderate dehydration may include:   Very dry mouth.  Muscle cramps.  Dark urine. Urine may be the color of tea.  Decreased urine production.  Decreased tear production.  Heartbeat that is irregular or faster than normal (palpitations).  Headache.  Light-headedness, especially when you stand up from a sitting position.  Fainting (syncope). Symptoms of severe dehydration may include:   Changes in skin, such as:  Cold and clammy skin.  Blotchy (mottled) or pale skin.  Skin that does  not quickly return to normal after being lightly pinched and released (poor skin turgor).  Changes in body fluids, such as:  Extreme thirst.  No tear production.  Inability to sweat when body temperature is high, such as in hot weather.  Very little urine production.  Changes in vital signs, such as:  Weak pulse.  Pulse that is more than 100 beats a minute when sitting still.  Rapid breathing.  Low blood pressure.  Other changes, such as:  Sunken eyes.  Cold hands and feet.  Confusion.  Lack of energy (lethargy).  Difficulty waking up from sleep.  Short-term weight loss.  Unconsciousness. How is this diagnosed? This condition is diagnosed based on your symptoms and a physical exam. Blood and urine tests may be done to help confirm the diagnosis. How is this treated? Treatment for this condition depends on the severity. Mild or moderate dehydration can often be treated at home. Treatment should be started right away. Do not wait until dehydration becomes severe. Severe dehydration is an emergency and it needs to be treated in a hospital. Treatment for mild dehydration may include:   Drinking more fluids.  Replacing salts and minerals in your blood (electrolytes) that you may have lost. Treatment for moderate dehydration may include:   Drinking an oral rehydration solution (ORS). This is a drink that helps you replace fluids and electrolytes (rehydrate). It can be found at pharmacies and retail stores. Treatment for severe dehydration may include:   Receiving fluids through an IV tube.  Receiving an electrolyte solution through a feeding tube that is   passed through your nose and into your stomach (nasogastric tube, or NG tube).  Correcting any abnormalities in electrolytes.  Treating the underlying cause of dehydration. Follow these instructions at home:  If directed by your health care provider, drink an ORS:  Make an ORS by following instructions on the  package.  Start by drinking small amounts, about  cup (120 mL) every 5-10 minutes.  Slowly increase how much you drink until you have taken the amount recommended by your health care provider.  Drink enough clear fluid to keep your urine clear or pale yellow. If you were told to drink an ORS, finish the ORS first, then start slowly drinking other clear fluids. Drink fluids such as:  Water. Do not drink only water. Doing that can lead to having too little salt (sodium) in the body (hyponatremia).  Ice chips.  Fruit juice that you have added water to (diluted fruit juice).  Low-calorie sports drinks.  Avoid:  Alcohol.  Drinks that contain a lot of sugar. These include high-calorie sports drinks, fruit juice that is not diluted, and soda.  Caffeine.  Foods that are greasy or contain a lot of fat or sugar.  Take over-the-counter and prescription medicines only as told by your health care provider.  Do not take sodium tablets. This can lead to having too much sodium in the body (hypernatremia).  Eat foods that contain a healthy balance of electrolytes, such as bananas, oranges, potatoes, tomatoes, and spinach.  Keep all follow-up visits as told by your health care provider. This is important. Contact a health care provider if:  You have abdominal pain that:  Gets worse.  Stays in one area (localizes).  You have a rash.  You have a stiff neck.  You are more irritable than usual.  You are sleepier or more difficult to wake up than usual.  You feel weak or dizzy.  You feel very thirsty.  You have urinated only a small amount of very dark urine over 6-8 hours. Get help right away if:  You have symptoms of severe dehydration.  You cannot drink fluids without vomiting.  Your symptoms get worse with treatment.  You have a fever.  You have a severe headache.  You have vomiting or diarrhea that:  Gets worse.  Does not go away.  You have blood or green matter  (bile) in your vomit.  You have blood in your stool. This may cause stool to look black and tarry.  You have not urinated in 6-8 hours.  You faint.  Your heart rate while sitting still is over 100 beats a minute.  You have trouble breathing. This information is not intended to replace advice given to you by your health care provider. Make sure you discuss any questions you have with your health care provider. Document Released: 12/18/2004 Document Revised: 07/15/2015 Document Reviewed: 02/11/2015 Elsevier Interactive Patient Education  2017 [employer]  

## 2016-04-19 NOTE — Progress Notes (Signed)
RN visit for IV fluids. 

## 2016-04-19 NOTE — Assessment & Plan Note (Signed)
The patient has improvement of renal failure with aggressive IV fluid hydration daily.   I will resume chemotherapy this week with dose modification She will continue blood draw weekly I will continue to see her weekly for supportive care

## 2016-04-19 NOTE — Assessment & Plan Note (Signed)
She has recent shingles outbreak and is prescribed medication for this. The blisters are gone I recommend placing her on isolation while receiving chemotherapy tomorrow

## 2016-04-19 NOTE — Assessment & Plan Note (Signed)
This is likely due to side effects of treatment. She is not symptomatic. She does not need blood transfusion. We will continue dose modification as above

## 2016-04-20 ENCOUNTER — Ambulatory Visit
Admission: RE | Admit: 2016-04-20 | Discharge: 2016-04-20 | Disposition: A | Discharge status: Home / Self Care | Payer: 59 | Source: Ambulatory Visit | Attending: Radiation Oncology | Admitting: Radiation Oncology

## 2016-04-20 ENCOUNTER — Ambulatory Visit: Payer: 59 | Admitting: Nutrition

## 2016-04-20 ENCOUNTER — Ambulatory Visit (HOSPITAL_BASED_OUTPATIENT_CLINIC_OR_DEPARTMENT_OTHER): Payer: 59

## 2016-04-20 VITALS — BP 137/84 | HR 89 | Temp 98.4°F | Resp 18

## 2016-04-20 DIAGNOSIS — Z510 Encounter for antineoplastic radiation therapy: Secondary | ICD-10-CM | POA: Diagnosis not present

## 2016-04-20 DIAGNOSIS — C023 Malignant neoplasm of anterior two-thirds of tongue, part unspecified: Secondary | ICD-10-CM | POA: Diagnosis not present

## 2016-04-20 DIAGNOSIS — Z5111 Encounter for antineoplastic chemotherapy: Secondary | ICD-10-CM | POA: Diagnosis not present

## 2016-04-20 MED ORDER — HEPARIN SOD (PORK) LOCK FLUSH 100 UNIT/ML IV SOLN
500.0000 [IU] | Freq: Once | INTRAVENOUS | Status: AC | PRN
  Administered 2016-04-20: 500 [IU]
  Filled 2016-04-20: qty 5

## 2016-04-20 MED ORDER — SODIUM CHLORIDE 0.9 % IV SOLN
20.0000 mg/m2 | Freq: Once | INTRAVENOUS | Status: AC
  Administered 2016-04-20: 44 mg via INTRAVENOUS
  Filled 2016-04-20: qty 44

## 2016-04-20 MED ORDER — SODIUM CHLORIDE 0.9 % IV SOLN
Freq: Once | INTRAVENOUS | Status: AC
  Administered 2016-04-20: 13:00:00 via INTRAVENOUS

## 2016-04-20 MED ORDER — PALONOSETRON HCL INJECTION 0.25 MG/5ML
0.2500 mg | Freq: Once | INTRAVENOUS | Status: AC
  Administered 2016-04-20: 0.25 mg via INTRAVENOUS

## 2016-04-20 MED ORDER — SODIUM CHLORIDE 0.9% FLUSH
10.0000 mL | INTRAVENOUS | Status: DC | PRN
  Administered 2016-04-20: 10 mL
  Filled 2016-04-20: qty 10

## 2016-04-20 MED ORDER — POTASSIUM CHLORIDE 2 MEQ/ML IV SOLN
Freq: Once | INTRAVENOUS | Status: AC
  Administered 2016-04-20: 11:00:00 via INTRAVENOUS
  Filled 2016-04-20: qty 10

## 2016-04-20 MED ORDER — SODIUM CHLORIDE 0.9 % IV SOLN
Freq: Once | INTRAVENOUS | Status: AC
  Administered 2016-04-20: 13:00:00 via INTRAVENOUS
  Filled 2016-04-20: qty 5

## 2016-04-20 MED ORDER — PALONOSETRON HCL INJECTION 0.25 MG/5ML
INTRAVENOUS | Status: AC
  Filled 2016-04-20: qty 5

## 2016-04-20 NOTE — Patient Instructions (Signed)
[facility] Discharge Instructions for Patients Receiving Chemotherapy  Today you received the following chemotherapy agents: Cisplatin   To help prevent nausea and vomiting after your treatment, we encourage you to take your nausea medication as directed.    If you develop nausea and vomiting that is not controlled by your nausea medication, call the clinic.   BELOW ARE SYMPTOMS THAT SHOULD BE REPORTED IMMEDIATELY:  *FEVER GREATER THAN 100.5 F  *CHILLS WITH OR WITHOUT FEVER  NAUSEA AND VOMITING THAT IS NOT CONTROLLED WITH YOUR NAUSEA MEDICATION  *UNUSUAL SHORTNESS OF BREATH  *UNUSUAL BRUISING OR BLEEDING  TENDERNESS IN MOUTH AND THROAT WITH OR WITHOUT PRESENCE OF ULCERS  *URINARY PROBLEMS  *BOWEL PROBLEMS  UNUSUAL RASH Items with * indicate a potential emergency and should be followed up as soon as possible.  Feel free to call the clinic you have any questions or concerns. The clinic phone number is [phone].  Please show the CHEMO ALERT CARD at check-in to the Emergency Department and triage nurse.   

## 2016-04-20 NOTE — Progress Notes (Signed)
Nutrition follow-up completed with patient during infusion for tongue cancer. Patient is unable to eat or drink by mouth. Weight declined documented as 206.6 pounds April 19, down from 212 pounds March 29. Patient is using one can Osmolite 1.5-4 times a day. She reports this makes her vomit about 5 minutes after infusion. Patient reports no vomiting after instilling Ensure Plus. Patient states she is using room temperature formula and administering this formula properly.  Nutrition diagnosis: Unintended weight loss continues. Severe malnutrition continues.  Intervention: Educated patient to use Ensure Plus 1.5 cans 4 times a day every 4 hours with 60 cc free water before and after bolus feedings. This provides 2100 cal, 78 g protein, 1692 mL free water. Enforced the importance of administering tube feeding room temperature and slowly. Encouraged patient take nausea medications as educated.   Patient educated to give additional 240 cc of free water 3 times a day by mouth or via tube. May add additional protein once tube feeding tolerated at goal rate. Questions were answered.  Teach back method used.  Monitoring, evaluation, goals:  Patient will tolerate tube feeding to meet greater than 90% of estimated caloric needs.  Next visit: Friday able 27, during infusion.  **Disclaimer: This note was dictated with voice recognition software. Similar sounding words can inadvertently be transcribed and this note may contain transcription errors which may not have been corrected upon publication of note.**

## 2016-04-20 NOTE — Progress Notes (Signed)
Pt has low grade temp 99.5. Pt asymptomatic at this time. Denies any fevers or chills at home. Pt states that she keeps her port a cath accessed until next week. Inspected dressing and has no swelling, redness or irritation. Flushes well with good blood return. Will recheck temp and notify MD if pt temp remains elevated.

## 2016-04-21 ENCOUNTER — Ambulatory Visit (HOSPITAL_BASED_OUTPATIENT_CLINIC_OR_DEPARTMENT_OTHER): Payer: 59

## 2016-04-21 VITALS — BP 133/69 | HR 81 | Temp 98.6°F | Resp 18

## 2016-04-21 DIAGNOSIS — C023 Malignant neoplasm of anterior two-thirds of tongue, part unspecified: Secondary | ICD-10-CM | POA: Diagnosis not present

## 2016-04-21 MED ORDER — SODIUM CHLORIDE 0.9% FLUSH
10.0000 mL | INTRAVENOUS | Status: DC | PRN
  Administered 2016-04-21: 10 mL
  Filled 2016-04-21: qty 10

## 2016-04-21 MED ORDER — HEPARIN SOD (PORK) LOCK FLUSH 100 UNIT/ML IV SOLN
500.0000 [IU] | Freq: Once | INTRAVENOUS | Status: DC | PRN
  Filled 2016-04-21: qty 5

## 2016-04-21 MED ORDER — SODIUM CHLORIDE 0.9 % IV SOLN
Freq: Once | INTRAVENOUS | Status: AC
  Administered 2016-04-21: 09:00:00 via INTRAVENOUS

## 2016-04-21 MED ORDER — SODIUM CHLORIDE 0.9% FLUSH
10.0000 mL | Freq: Once | INTRAVENOUS | Status: AC
  Administered 2016-04-21: 10 mL
  Filled 2016-04-21: qty 10

## 2016-04-21 MED ORDER — HEPARIN SOD (PORK) LOCK FLUSH 100 UNIT/ML IV SOLN
500.0000 [IU] | Freq: Once | INTRAVENOUS | Status: AC
  Administered 2016-04-21: 500 [IU]
  Filled 2016-04-21: qty 5

## 2016-04-21 NOTE — Patient Instructions (Signed)
Dehydration, Adult Dehydration is when there is not enough fluid or water in your body. This happens when you lose more fluids than you take in. Dehydration can range from mild to very bad. It should be treated right away to keep it from getting very bad. Symptoms of mild dehydration may include:   Thirst.  Dry lips.  Slightly dry mouth.  Dry, warm skin.  Dizziness. Symptoms of moderate dehydration may include:   Very dry mouth.  Muscle cramps.  Dark pee (urine). Pee may be the color of tea.  Your body making less pee.  Your eyes making fewer tears.  Heartbeat that is uneven or faster than normal (palpitations).  Headache.  Light-headedness, especially when you stand up from sitting.  Fainting (syncope). Symptoms of very bad dehydration may include:   Changes in skin, such as:  Cold and clammy skin.  Blotchy (mottled) or pale skin.  Skin that does not quickly return to normal after being lightly pinched and let go (poor skin turgor).  Changes in body fluids, such as:  Feeling very thirsty.  Your eyes making fewer tears.  Not sweating when body temperature is high, such as in hot weather.  Your body making very little pee.  Changes in vital signs, such as:  Weak pulse.  Pulse that is more than 100 beats a minute when you are sitting still.  Fast breathing.  Low blood pressure.  Other changes, such as:  Sunken eyes.  Cold hands and feet.  Confusion.  Lack of energy (lethargy).  Trouble waking up from sleep.  Short-term weight loss.  Unconsciousness. Follow these instructions at home:  If told by your doctor, drink an ORS:  Make an ORS by using instructions on the package.  Start by drinking small amounts, about  cup (120 mL) every 5-10 minutes.  Slowly drink more until you have had the amount that your doctor said to have.  Drink enough clear fluid to keep your pee clear or pale yellow. If you were told to drink an ORS, finish the  ORS first, then start slowly drinking clear fluids. Drink fluids such as:  Water. Do not drink only water by itself. Doing that can make the salt (sodium) level in your body get too low (hyponatremia).  Ice chips.  Fruit juice that you have added water to (diluted).  Low-calorie sports drinks.  Avoid:  Alcohol.  Drinks that have a lot of sugar. These include high-calorie sports drinks, fruit juice that does not have water added, and soda.  Caffeine.  Foods that are greasy or have a lot of fat or sugar.  Take over-the-counter and prescription medicines only as told by your doctor.  Do not take salt tablets. Doing that can make the salt level in your body get too high (hypernatremia).  Eat foods that have minerals (electrolytes). Examples include bananas, oranges, potatoes, tomatoes, and spinach.  Keep all follow-up visits as told by your doctor. This is important. Contact a doctor if:  You have belly (abdominal) pain that:  Gets worse.  Stays in one area (localizes).  You have a rash.  You have a stiff neck.  You get angry or annoyed more easily than normal (irritability).  You are more sleepy than normal.  You have a harder time waking up than normal.  You feel:  Weak.  Dizzy.  Very thirsty.  You have peed (urinated) only a small amount of very dark pee during 6-8 hours. Get help right away if:  You   have symptoms of very bad dehydration.  You cannot drink fluids without throwing up (vomiting).  Your symptoms get worse with treatment.  You have a fever.  You have a very bad headache.  You are throwing up or having watery poop (diarrhea) and it:  Gets worse.  Does not go away.  You have blood or something green (bile) in your throw-up.  You have blood in your poop (stool). This may cause poop to look black and tarry.  You have not peed in 6-8 hours.  You pass out (faint).  Your heart rate when you are sitting still is more than 100 beats a  minute.  You have trouble breathing. This information is not intended to replace advice given to you by your health care provider. Make sure you discuss any questions you have with your health care provider. Document Released: 10/14/2008 Document Revised: 07/08/2015 Document Reviewed: 02/11/2015 Elsevier Interactive Patient Education  2017 [employer]  

## 2016-04-23 ENCOUNTER — Ambulatory Visit
Admission: RE | Admit: 2016-04-23 | Discharge: 2016-04-23 | Disposition: A | Discharge status: Home / Self Care | Payer: 59 | Source: Ambulatory Visit | Attending: Radiation Oncology | Admitting: Radiation Oncology

## 2016-04-23 ENCOUNTER — Telehealth: Payer: Self-pay | Admitting: Hematology and Oncology

## 2016-04-23 ENCOUNTER — Ambulatory Visit (HOSPITAL_BASED_OUTPATIENT_CLINIC_OR_DEPARTMENT_OTHER): Payer: 59

## 2016-04-23 VITALS — BP 132/79 | HR 88 | Temp 99.0°F | Resp 18

## 2016-04-23 DIAGNOSIS — N179 Acute kidney failure, unspecified: Secondary | ICD-10-CM | POA: Diagnosis not present

## 2016-04-23 DIAGNOSIS — C023 Malignant neoplasm of anterior two-thirds of tongue, part unspecified: Secondary | ICD-10-CM | POA: Diagnosis not present

## 2016-04-23 DIAGNOSIS — Z510 Encounter for antineoplastic radiation therapy: Secondary | ICD-10-CM | POA: Diagnosis not present

## 2016-04-23 MED ORDER — SODIUM CHLORIDE 0.9 % IV SOLN
Freq: Once | INTRAVENOUS | Status: AC
  Administered 2016-04-23: 11:00:00 via INTRAVENOUS

## 2016-04-23 NOTE — Telephone Encounter (Signed)
Gave completed forms to patient in infusion room on 04/20/16. Scanned copy of completed forms on Epic

## 2016-04-23 NOTE — Patient Instructions (Signed)
Dehydration, Adult Dehydration is a condition in which there is not enough fluid or water in the body. This happens when you lose more fluids than you take in. Important organs, such as the kidneys, brain, and heart, cannot function without a proper amount of fluids. Any loss of fluids from the body can lead to dehydration. Dehydration can range from mild to severe. This condition should be treated right away to prevent it from becoming severe. What are the causes? This condition may be caused by:  Vomiting.  Diarrhea.  Excessive sweating, such as from heat exposure or exercise.  Not drinking enough fluid, especially:  When ill.  While doing activity that requires a lot of energy.  Excessive urination.  Fever.  Infection.  Certain medicines, such as medicines that cause the body to lose excess fluid (diuretics).  Inability to access safe drinking water.  Reduced physical ability to get adequate water and food. What increases the risk? This condition is more likely to develop in people:  Who have a poorly controlled long-term (chronic) illness, such as diabetes, heart disease, or kidney disease.  Who are age 65 or older.  Who are disabled.  Who live in a place with high altitude.  Who play endurance sports. What are the signs or symptoms? Symptoms of mild dehydration may include:   Thirst.  Dry lips.  Slightly dry mouth.  Dry, warm skin.  Dizziness. Symptoms of moderate dehydration may include:   Very dry mouth.  Muscle cramps.  Dark urine. Urine may be the color of tea.  Decreased urine production.  Decreased tear production.  Heartbeat that is irregular or faster than normal (palpitations).  Headache.  Light-headedness, especially when you stand up from a sitting position.  Fainting (syncope). Symptoms of severe dehydration may include:   Changes in skin, such as:  Cold and clammy skin.  Blotchy (mottled) or pale skin.  Skin that does  not quickly return to normal after being lightly pinched and released (poor skin turgor).  Changes in body fluids, such as:  Extreme thirst.  No tear production.  Inability to sweat when body temperature is high, such as in hot weather.  Very little urine production.  Changes in vital signs, such as:  Weak pulse.  Pulse that is more than 100 beats a minute when sitting still.  Rapid breathing.  Low blood pressure.  Other changes, such as:  Sunken eyes.  Cold hands and feet.  Confusion.  Lack of energy (lethargy).  Difficulty waking up from sleep.  Short-term weight loss.  Unconsciousness. How is this diagnosed? This condition is diagnosed based on your symptoms and a physical exam. Blood and urine tests may be done to help confirm the diagnosis. How is this treated? Treatment for this condition depends on the severity. Mild or moderate dehydration can often be treated at home. Treatment should be started right away. Do not wait until dehydration becomes severe. Severe dehydration is an emergency and it needs to be treated in a hospital. Treatment for mild dehydration may include:   Drinking more fluids.  Replacing salts and minerals in your blood (electrolytes) that you may have lost. Treatment for moderate dehydration may include:   Drinking an oral rehydration solution (ORS). This is a drink that helps you replace fluids and electrolytes (rehydrate). It can be found at pharmacies and retail stores. Treatment for severe dehydration may include:   Receiving fluids through an IV tube.  Receiving an electrolyte solution through a feeding tube that is   passed through your nose and into your stomach (nasogastric tube, or NG tube).  Correcting any abnormalities in electrolytes.  Treating the underlying cause of dehydration. Follow these instructions at home:  If directed by your health care provider, drink an ORS:  Make an ORS by following instructions on the  package.  Start by drinking small amounts, about  cup (120 mL) every 5-10 minutes.  Slowly increase how much you drink until you have taken the amount recommended by your health care provider.  Drink enough clear fluid to keep your urine clear or pale yellow. If you were told to drink an ORS, finish the ORS first, then start slowly drinking other clear fluids. Drink fluids such as:  Water. Do not drink only water. Doing that can lead to having too little salt (sodium) in the body (hyponatremia).  Ice chips.  Fruit juice that you have added water to (diluted fruit juice).  Low-calorie sports drinks.  Avoid:  Alcohol.  Drinks that contain a lot of sugar. These include high-calorie sports drinks, fruit juice that is not diluted, and soda.  Caffeine.  Foods that are greasy or contain a lot of fat or sugar.  Take over-the-counter and prescription medicines only as told by your health care provider.  Do not take sodium tablets. This can lead to having too much sodium in the body (hypernatremia).  Eat foods that contain a healthy balance of electrolytes, such as bananas, oranges, potatoes, tomatoes, and spinach.  Keep all follow-up visits as told by your health care provider. This is important. Contact a health care provider if:  You have abdominal pain that:  Gets worse.  Stays in one area (localizes).  You have a rash.  You have a stiff neck.  You are more irritable than usual.  You are sleepier or more difficult to wake up than usual.  You feel weak or dizzy.  You feel very thirsty.  You have urinated only a small amount of very dark urine over 6-8 hours. Get help right away if:  You have symptoms of severe dehydration.  You cannot drink fluids without vomiting.  Your symptoms get worse with treatment.  You have a fever.  You have a severe headache.  You have vomiting or diarrhea that:  Gets worse.  Does not go away.  You have blood or green matter  (bile) in your vomit.  You have blood in your stool. This may cause stool to look black and tarry.  You have not urinated in 6-8 hours.  You faint.  Your heart rate while sitting still is over 100 beats a minute.  You have trouble breathing. This information is not intended to replace advice given to you by your health care provider. Make sure you discuss any questions you have with your health care provider. Document Released: 12/18/2004 Document Revised: 07/15/2015 Document Reviewed: 02/11/2015 Elsevier Interactive Patient Education  2017 [employer]  

## 2016-04-24 ENCOUNTER — Ambulatory Visit: Payer: Self-pay

## 2016-04-24 ENCOUNTER — Ambulatory Visit
Admission: RE | Admit: 2016-04-24 | Discharge: 2016-04-24 | Disposition: A | Discharge status: Home / Self Care | Payer: 59 | Source: Ambulatory Visit | Attending: Radiation Oncology | Admitting: Radiation Oncology

## 2016-04-24 ENCOUNTER — Encounter: Payer: Self-pay | Admitting: *Deleted

## 2016-04-24 DIAGNOSIS — Z510 Encounter for antineoplastic radiation therapy: Secondary | ICD-10-CM | POA: Diagnosis not present

## 2016-04-24 NOTE — Progress Notes (Unsigned)
Pt did not show for IVF's in infusion room today.  Call placed to patient's cell phone and home phone with no answer.  Dr. Alvy Bimler nurse notified of no show for IVF's today.

## 2016-04-25 ENCOUNTER — Other Ambulatory Visit (HOSPITAL_BASED_OUTPATIENT_CLINIC_OR_DEPARTMENT_OTHER): Payer: 59

## 2016-04-25 ENCOUNTER — Ambulatory Visit
Admission: RE | Admit: 2016-04-25 | Discharge: 2016-04-25 | Disposition: A | Discharge status: Home / Self Care | Payer: 59 | Source: Ambulatory Visit | Attending: Radiation Oncology | Admitting: Radiation Oncology

## 2016-04-25 ENCOUNTER — Ambulatory Visit (HOSPITAL_BASED_OUTPATIENT_CLINIC_OR_DEPARTMENT_OTHER): Payer: 59

## 2016-04-25 VITALS — BP 128/67 | HR 99 | Temp 99.2°F | Resp 18 | Ht 67.0 in | Wt 204.8 lb

## 2016-04-25 DIAGNOSIS — C023 Malignant neoplasm of anterior two-thirds of tongue, part unspecified: Secondary | ICD-10-CM

## 2016-04-25 DIAGNOSIS — N179 Acute kidney failure, unspecified: Secondary | ICD-10-CM | POA: Diagnosis not present

## 2016-04-25 DIAGNOSIS — Z510 Encounter for antineoplastic radiation therapy: Secondary | ICD-10-CM | POA: Diagnosis not present

## 2016-04-25 DIAGNOSIS — E860 Dehydration: Secondary | ICD-10-CM | POA: Diagnosis not present

## 2016-04-25 LAB — COMPREHENSIVE METABOLIC PANEL
ALBUMIN: 3.5 g/dL (ref 3.5–5.0)
ALK PHOS: 107 U/L (ref 40–150)
ALT: 14 U/L (ref 0–55)
ANION GAP: 10 meq/L (ref 3–11)
AST: 25 U/L (ref 5–34)
BILIRUBIN TOTAL: 0.66 mg/dL (ref 0.20–1.20)
BUN: 12 mg/dL (ref 7.0–26.0)
CO2: 29 mEq/L (ref 22–29)
Calcium: 9.3 mg/dL (ref 8.4–10.4)
Chloride: 101 mEq/L (ref 98–109)
Creatinine: 1.2 mg/dL — ABNORMAL HIGH (ref 0.6–1.1)
EGFR: 55 mL/min/{1.73_m2} — AB (ref >90)
GLUCOSE: 101 mg/dL (ref 70–140)
Potassium: 3.6 mEq/L (ref 3.5–5.1)
Sodium: 140 mEq/L (ref 136–145)
TOTAL PROTEIN: 7.2 g/dL (ref 6.4–8.3)

## 2016-04-25 LAB — MAGNESIUM: Magnesium: 1.6 mg/dl (ref 1.5–2.5)

## 2016-04-25 LAB — CBC WITH DIFFERENTIAL/PLATELET
BASO%: 0.6 % (ref 0.0–2.0)
Basophils Absolute: 0 10*3/uL (ref 0.0–0.1)
EOS%: 1.9 % (ref 0.0–7.0)
Eosinophils Absolute: 0.1 10*3/uL (ref 0.0–0.5)
HCT: 32 % — ABNORMAL LOW (ref 34.8–46.6)
HEMOGLOBIN: 11.1 g/dL — AB (ref 11.6–15.9)
LYMPH%: 7.9 % — AB (ref 14.0–49.7)
MCH: 29 pg (ref 25.1–34.0)
MCHC: 34.5 g/dL (ref 31.5–36.0)
MCV: 84.1 fL (ref 79.5–101.0)
MONO#: 0.5 10*3/uL (ref 0.1–0.9)
MONO%: 15 % — AB (ref 0.0–14.0)
NEUT#: 2.6 10*3/uL (ref 1.5–6.5)
NEUT%: 74.6 % (ref 38.4–76.8)
Platelets: 212 10*3/uL (ref 145–400)
RBC: 3.81 10*6/uL (ref 3.70–5.45)
RDW: 16.8 % — AB (ref 11.2–14.5)
WBC: 3.5 10*3/uL — ABNORMAL LOW (ref 3.9–10.3)
lymph#: 0.3 10*3/uL — ABNORMAL LOW (ref 0.9–3.3)

## 2016-04-25 MED ORDER — ONDANSETRON HCL 4 MG/2ML IJ SOLN
INTRAMUSCULAR | Status: AC
  Filled 2016-04-25: qty 4

## 2016-04-25 MED ORDER — SODIUM CHLORIDE 0.9% FLUSH
10.0000 mL | INTRAVENOUS | Status: DC | PRN
  Administered 2016-04-25: 10 mL
  Filled 2016-04-25: qty 10

## 2016-04-25 MED ORDER — ONDANSETRON HCL 4 MG/2ML IJ SOLN
8.0000 mg | Freq: Once | INTRAMUSCULAR | Status: AC
  Administered 2016-04-25: 8 mg via INTRAVENOUS

## 2016-04-25 MED ORDER — SODIUM CHLORIDE 0.9 % IV SOLN
Freq: Once | INTRAVENOUS | Status: AC
  Administered 2016-04-25: 11:00:00 via INTRAVENOUS

## 2016-04-25 MED ORDER — SODIUM CHLORIDE 0.9 % IV SOLN: Freq: Once | INTRAVENOUS | Status: DC

## 2016-04-25 MED ORDER — HEPARIN SOD (PORK) LOCK FLUSH 100 UNIT/ML IV SOLN
500.0000 [IU] | Freq: Once | INTRAVENOUS | Status: AC | PRN
  Administered 2016-04-25: 500 [IU]
  Filled 2016-04-25: qty 5

## 2016-04-25 NOTE — Progress Notes (Signed)
RN visit for IV fluids. 

## 2016-04-25 NOTE — Patient Instructions (Signed)
Dehydration, Adult Dehydration is a condition in which there is not enough fluid or water in the body. This happens when you lose more fluids than you take in. Important organs, such as the kidneys, brain, and heart, cannot function without a proper amount of fluids. Any loss of fluids from the body can lead to dehydration. Dehydration can range from mild to severe. This condition should be treated right away to prevent it from becoming severe. What are the causes? This condition may be caused by:  Vomiting.  Diarrhea.  Excessive sweating, such as from heat exposure or exercise.  Not drinking enough fluid, especially:  When ill.  While doing activity that requires a lot of energy.  Excessive urination.  Fever.  Infection.  Certain medicines, such as medicines that cause the body to lose excess fluid (diuretics).  Inability to access safe drinking water.  Reduced physical ability to get adequate water and food. What increases the risk? This condition is more likely to develop in people:  Who have a poorly controlled long-term (chronic) illness, such as diabetes, heart disease, or kidney disease.  Who are age 65 or older.  Who are disabled.  Who live in a place with high altitude.  Who play endurance sports. What are the signs or symptoms? Symptoms of mild dehydration may include:   Thirst.  Dry lips.  Slightly dry mouth.  Dry, warm skin.  Dizziness. Symptoms of moderate dehydration may include:   Very dry mouth.  Muscle cramps.  Dark urine. Urine may be the color of tea.  Decreased urine production.  Decreased tear production.  Heartbeat that is irregular or faster than normal (palpitations).  Headache.  Light-headedness, especially when you stand up from a sitting position.  Fainting (syncope). Symptoms of severe dehydration may include:   Changes in skin, such as:  Cold and clammy skin.  Blotchy (mottled) or pale skin.  Skin that does  not quickly return to normal after being lightly pinched and released (poor skin turgor).  Changes in body fluids, such as:  Extreme thirst.  No tear production.  Inability to sweat when body temperature is high, such as in hot weather.  Very little urine production.  Changes in vital signs, such as:  Weak pulse.  Pulse that is more than 100 beats a minute when sitting still.  Rapid breathing.  Low blood pressure.  Other changes, such as:  Sunken eyes.  Cold hands and feet.  Confusion.  Lack of energy (lethargy).  Difficulty waking up from sleep.  Short-term weight loss.  Unconsciousness. How is this diagnosed? This condition is diagnosed based on your symptoms and a physical exam. Blood and urine tests may be done to help confirm the diagnosis. How is this treated? Treatment for this condition depends on the severity. Mild or moderate dehydration can often be treated at home. Treatment should be started right away. Do not wait until dehydration becomes severe. Severe dehydration is an emergency and it needs to be treated in a hospital. Treatment for mild dehydration may include:   Drinking more fluids.  Replacing salts and minerals in your blood (electrolytes) that you may have lost. Treatment for moderate dehydration may include:   Drinking an oral rehydration solution (ORS). This is a drink that helps you replace fluids and electrolytes (rehydrate). It can be found at pharmacies and retail stores. Treatment for severe dehydration may include:   Receiving fluids through an IV tube.  Receiving an electrolyte solution through a feeding tube that is   passed through your nose and into your stomach (nasogastric tube, or NG tube).  Correcting any abnormalities in electrolytes.  Treating the underlying cause of dehydration. Follow these instructions at home:  If directed by your health care provider, drink an ORS:  Make an ORS by following instructions on the  package.  Start by drinking small amounts, about  cup (120 mL) every 5-10 minutes.  Slowly increase how much you drink until you have taken the amount recommended by your health care provider.  Drink enough clear fluid to keep your urine clear or pale yellow. If you were told to drink an ORS, finish the ORS first, then start slowly drinking other clear fluids. Drink fluids such as:  Water. Do not drink only water. Doing that can lead to having too little salt (sodium) in the body (hyponatremia).  Ice chips.  Fruit juice that you have added water to (diluted fruit juice).  Low-calorie sports drinks.  Avoid:  Alcohol.  Drinks that contain a lot of sugar. These include high-calorie sports drinks, fruit juice that is not diluted, and soda.  Caffeine.  Foods that are greasy or contain a lot of fat or sugar.  Take over-the-counter and prescription medicines only as told by your health care provider.  Do not take sodium tablets. This can lead to having too much sodium in the body (hypernatremia).  Eat foods that contain a healthy balance of electrolytes, such as bananas, oranges, potatoes, tomatoes, and spinach.  Keep all follow-up visits as told by your health care provider. This is important. Contact a health care provider if:  You have abdominal pain that:  Gets worse.  Stays in one area (localizes).  You have a rash.  You have a stiff neck.  You are more irritable than usual.  You are sleepier or more difficult to wake up than usual.  You feel weak or dizzy.  You feel very thirsty.  You have urinated only a small amount of very dark urine over 6-8 hours. Get help right away if:  You have symptoms of severe dehydration.  You cannot drink fluids without vomiting.  Your symptoms get worse with treatment.  You have a fever.  You have a severe headache.  You have vomiting or diarrhea that:  Gets worse.  Does not go away.  You have blood or green matter  (bile) in your vomit.  You have blood in your stool. This may cause stool to look black and tarry.  You have not urinated in 6-8 hours.  You faint.  Your heart rate while sitting still is over 100 beats a minute.  You have trouble breathing. This information is not intended to replace advice given to you by your health care provider. Make sure you discuss any questions you have with your health care provider. Document Released: 12/18/2004 Document Revised: 07/15/2015 Document Reviewed: 02/11/2015 Elsevier Interactive Patient Education  2017 [employer]  

## 2016-04-26 ENCOUNTER — Ambulatory Visit
Admission: RE | Admit: 2016-04-26 | Discharge: 2016-04-26 | Disposition: A | Discharge status: Home / Self Care | Payer: 59 | Source: Ambulatory Visit | Attending: Radiation Oncology | Admitting: Radiation Oncology

## 2016-04-26 ENCOUNTER — Telehealth: Payer: Self-pay | Admitting: Hematology and Oncology

## 2016-04-26 ENCOUNTER — Ambulatory Visit (HOSPITAL_BASED_OUTPATIENT_CLINIC_OR_DEPARTMENT_OTHER): Payer: 59 | Admitting: Hematology and Oncology

## 2016-04-26 ENCOUNTER — Ambulatory Visit (HOSPITAL_BASED_OUTPATIENT_CLINIC_OR_DEPARTMENT_OTHER): Payer: 59

## 2016-04-26 ENCOUNTER — Encounter: Payer: Self-pay | Admitting: Hematology and Oncology

## 2016-04-26 VITALS — BP 151/69 | HR 100 | Temp 98.9°F | Resp 20

## 2016-04-26 DIAGNOSIS — Z510 Encounter for antineoplastic radiation therapy: Secondary | ICD-10-CM | POA: Diagnosis not present

## 2016-04-26 DIAGNOSIS — K117 Disturbances of salivary secretion: Secondary | ICD-10-CM

## 2016-04-26 DIAGNOSIS — C023 Malignant neoplasm of anterior two-thirds of tongue, part unspecified: Secondary | ICD-10-CM

## 2016-04-26 DIAGNOSIS — N179 Acute kidney failure, unspecified: Secondary | ICD-10-CM | POA: Diagnosis not present

## 2016-04-26 DIAGNOSIS — D61818 Other pancytopenia: Secondary | ICD-10-CM | POA: Diagnosis not present

## 2016-04-26 DIAGNOSIS — G893 Neoplasm related pain (acute) (chronic): Secondary | ICD-10-CM | POA: Diagnosis not present

## 2016-04-26 MED ORDER — ONDANSETRON HCL 4 MG/2ML IJ SOLN
8.0000 mg | Freq: Once | INTRAMUSCULAR | Status: AC
  Administered 2016-04-26: 8 mg via INTRAVENOUS

## 2016-04-26 MED ORDER — FENTANYL 50 MCG/HR TD PT72: 50.0000 ug | MEDICATED_PATCH | TRANSDERMAL | 0 refills | Status: DC

## 2016-04-26 MED ORDER — SODIUM CHLORIDE 0.9 % IV SOLN
Freq: Once | INTRAVENOUS | Status: AC
  Administered 2016-04-26: 12:00:00 via INTRAVENOUS

## 2016-04-26 MED ORDER — SODIUM CHLORIDE 0.9% FLUSH
10.0000 mL | INTRAVENOUS | Status: DC | PRN
  Administered 2016-04-26: 10 mL
  Filled 2016-04-26: qty 10

## 2016-04-26 MED ORDER — SODIUM CHLORIDE 0.9 % IV SOLN: Freq: Once | INTRAVENOUS | Status: DC

## 2016-04-26 MED ORDER — ONDANSETRON HCL 4 MG/2ML IJ SOLN
INTRAMUSCULAR | Status: AC
  Filled 2016-04-26: qty 4

## 2016-04-26 MED ORDER — HEPARIN SOD (PORK) LOCK FLUSH 100 UNIT/ML IV SOLN
500.0000 [IU] | Freq: Once | INTRAVENOUS | Status: AC | PRN
  Administered 2016-04-26: 500 [IU]
  Filled 2016-04-26: qty 5

## 2016-04-26 MED ORDER — ALTEPLASE 2 MG IJ SOLR
2.0000 mg | Freq: Once | INTRAMUSCULAR | Status: DC | PRN
  Filled 2016-04-26: qty 2

## 2016-04-26 MED FILL — fentaNYL 50 MCG/HR PT72: 50 | 15 days supply | Qty: 5 | Fill #0

## 2016-04-26 NOTE — Assessment & Plan Note (Signed)
Pain control is suboptimal I will increase fentanyl to 50 mcg and reassess pain control next week She will continue breakthrough liquid morphine sulfate

## 2016-04-26 NOTE — Patient Instructions (Signed)
Dehydration, Adult Dehydration is a condition in which there is not enough fluid or water in the body. This happens when you lose more fluids than you take in. Important organs, such as the kidneys, brain, and heart, cannot function without a proper amount of fluids. Any loss of fluids from the body can lead to dehydration. Dehydration can range from mild to severe. This condition should be treated right away to prevent it from becoming severe. What are the causes? This condition may be caused by:  Vomiting.  Diarrhea.  Excessive sweating, such as from heat exposure or exercise.  Not drinking enough fluid, especially:  When ill.  While doing activity that requires a lot of energy.  Excessive urination.  Fever.  Infection.  Certain medicines, such as medicines that cause the body to lose excess fluid (diuretics).  Inability to access safe drinking water.  Reduced physical ability to get adequate water and food. What increases the risk? This condition is more likely to develop in people:  Who have a poorly controlled long-term (chronic) illness, such as diabetes, heart disease, or kidney disease.  Who are age 65 or older.  Who are disabled.  Who live in a place with high altitude.  Who play endurance sports. What are the signs or symptoms? Symptoms of mild dehydration may include:   Thirst.  Dry lips.  Slightly dry mouth.  Dry, warm skin.  Dizziness. Symptoms of moderate dehydration may include:   Very dry mouth.  Muscle cramps.  Dark urine. Urine may be the color of tea.  Decreased urine production.  Decreased tear production.  Heartbeat that is irregular or faster than normal (palpitations).  Headache.  Light-headedness, especially when you stand up from a sitting position.  Fainting (syncope). Symptoms of severe dehydration may include:   Changes in skin, such as:  Cold and clammy skin.  Blotchy (mottled) or pale skin.  Skin that does  not quickly return to normal after being lightly pinched and released (poor skin turgor).  Changes in body fluids, such as:  Extreme thirst.  No tear production.  Inability to sweat when body temperature is high, such as in hot weather.  Very little urine production.  Changes in vital signs, such as:  Weak pulse.  Pulse that is more than 100 beats a minute when sitting still.  Rapid breathing.  Low blood pressure.  Other changes, such as:  Sunken eyes.  Cold hands and feet.  Confusion.  Lack of energy (lethargy).  Difficulty waking up from sleep.  Short-term weight loss.  Unconsciousness. How is this diagnosed? This condition is diagnosed based on your symptoms and a physical exam. Blood and urine tests may be done to help confirm the diagnosis. How is this treated? Treatment for this condition depends on the severity. Mild or moderate dehydration can often be treated at home. Treatment should be started right away. Do not wait until dehydration becomes severe. Severe dehydration is an emergency and it needs to be treated in a hospital. Treatment for mild dehydration may include:   Drinking more fluids.  Replacing salts and minerals in your blood (electrolytes) that you may have lost. Treatment for moderate dehydration may include:   Drinking an oral rehydration solution (ORS). This is a drink that helps you replace fluids and electrolytes (rehydrate). It can be found at pharmacies and retail stores. Treatment for severe dehydration may include:   Receiving fluids through an IV tube.  Receiving an electrolyte solution through a feeding tube that is   passed through your nose and into your stomach (nasogastric tube, or NG tube).  Correcting any abnormalities in electrolytes.  Treating the underlying cause of dehydration. Follow these instructions at home:  If directed by your health care provider, drink an ORS:  Make an ORS by following instructions on the  package.  Start by drinking small amounts, about  cup (120 mL) every 5-10 minutes.  Slowly increase how much you drink until you have taken the amount recommended by your health care provider.  Drink enough clear fluid to keep your urine clear or pale yellow. If you were told to drink an ORS, finish the ORS first, then start slowly drinking other clear fluids. Drink fluids such as:  Water. Do not drink only water. Doing that can lead to having too little salt (sodium) in the body (hyponatremia).  Ice chips.  Fruit juice that you have added water to (diluted fruit juice).  Low-calorie sports drinks.  Avoid:  Alcohol.  Drinks that contain a lot of sugar. These include high-calorie sports drinks, fruit juice that is not diluted, and soda.  Caffeine.  Foods that are greasy or contain a lot of fat or sugar.  Take over-the-counter and prescription medicines only as told by your health care provider.  Do not take sodium tablets. This can lead to having too much sodium in the body (hypernatremia).  Eat foods that contain a healthy balance of electrolytes, such as bananas, oranges, potatoes, tomatoes, and spinach.  Keep all follow-up visits as told by your health care provider. This is important. Contact a health care provider if:  You have abdominal pain that:  Gets worse.  Stays in one area (localizes).  You have a rash.  You have a stiff neck.  You are more irritable than usual.  You are sleepier or more difficult to wake up than usual.  You feel weak or dizzy.  You feel very thirsty.  You have urinated only a small amount of very dark urine over 6-8 hours. Get help right away if:  You have symptoms of severe dehydration.  You cannot drink fluids without vomiting.  Your symptoms get worse with treatment.  You have a fever.  You have a severe headache.  You have vomiting or diarrhea that:  Gets worse.  Does not go away.  You have blood or green matter  (bile) in your vomit.  You have blood in your stool. This may cause stool to look black and tarry.  You have not urinated in 6-8 hours.  You faint.  Your heart rate while sitting still is over 100 beats a minute.  You have trouble breathing. This information is not intended to replace advice given to you by your health care provider. Make sure you discuss any questions you have with your health care provider. Document Released: 12/18/2004 Document Revised: 07/15/2015 Document Reviewed: 02/11/2015 Elsevier Interactive Patient Education  2017 [employer]  

## 2016-04-26 NOTE — Assessment & Plan Note (Signed)
Mucus production is less with scopolamine patch.  Continue the same

## 2016-04-26 NOTE — Telephone Encounter (Signed)
Appointments scheduled per 04/26/16 los. Patient was given a copy of the AVS report and appointment schedule per 04/26/16 los. °

## 2016-04-26 NOTE — Assessment & Plan Note (Signed)
The patient has improvement of renal failure with aggressive IV fluid hydration daily.   I will continue chemotherapy this week with dose modification She will continue blood draw weekly I will continue to see her weekly for supportive care

## 2016-04-26 NOTE — Assessment & Plan Note (Signed)
This is secondary to poor oral intake, dehydration and recent chemotherapy. With aggressive IV fluid resuscitation, her serum creatinine is improving. She will resume chemotherapy tomorrow with 50% dose adjustment

## 2016-04-26 NOTE — Assessment & Plan Note (Signed)
This is likely due to side effects of treatment. She is not symptomatic. She does not need blood transfusion. We will continue dose modification as above

## 2016-04-26 NOTE — Progress Notes (Signed)
RN visit for IV fluids. 

## 2016-04-26 NOTE — Progress Notes (Signed)
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 Tongue, biopsy, Anterior deep margin left SQUAMOUS CELL CARCINOMA 3. Tongue, biopsy, Posterior deep margin left NEGATIVE FOR CARCINOMA 4. Tongue, biopsy, Posterior margin left SQUAMOUS CELL CARCINOMA 5. Tongue, excisional biopsy, New anterior deep margin NEGATIVE FOR CARCINOMA 6. Tongue, excisional biopsy, New posterior deep margin SMALL FOCUS OF SQUAMOUS CELL CARCINOMA (0.1 CM, ONLY PRESENT AT THE DEEPER PERMINENT SECTION) 7. Tongue, resection for tumor, Left lateral INFILTRATIVE KERATINIZED SQUAMOUS CELL CARCINOMA (3.2 CM) THE TUMOR INVADES UNDERNEATH MUSCLE OF THE TONGUE (2.0 CM, PT2) LYMPHOVASCULAR AND PERINEURAL INVASION IDENTIFIED SQUAMOUS CELL CARCINOMA PRESENTED AT (FINAL MARGINS REFER TO PART 2-6) LATERAL INKED MARGIN (1.0 CM) MEDIAL INKED MARGIN (0.6 CM)      02/08/2016 Surgery    SURGICAL PROCEDURES: 1. Left hemiglossectomy with primary closure. 2. Left selective neck dissection (zones I-III).      03/02/2016 Imaging    CT chest with contrast: No evidence of metastatic disease in the chest. 2. Aortic atherosclerosis.       03/02/2016 Imaging    CT neck with contrast showed : Newly enlarged and round 10 mm right  level 1b lymph node (series 5, image 45). The level 1A nodes remain normal. This right 1 B node is nonspecific but suspicious for progressive nodal metastatic disease. It might be amenable to Ultrasound-guided FNA. 2. Interval left hemiglossectomy and selective left neck dissection with confluent indeterminate soft tissue from the anterior left submandibular space through the left carotid space and obscuring the left level 1B, level 2, and level IIIa nodal stations. This study will serve as a new postoperative baseline of this area. Attention directed on close interval follow-up. 3. Mild soft tissue thickening also along the left lateral tongue resection margin. Attention directed on followup. 4. Chest CT today reported separately      03/14/2016 Procedure    She has normal baseline hearing test      03/15/2016 Procedure    1. Successful placement of a right internal jugular approach power injectable Port-A-Cath. The Port a catheter is ready for immediate use. 2. Successful fluoroscopic insertion of a 20-French pull-through gastrostomy tube. The gastrostomy may be used immediately for medication administration and may be utilized in 24 hrs for the initiation of feeds.      03/15/2016 -  Radiation Therapy    She received concurrent radiation treatment      03/16/2016 -  Chemotherapy    She received weekly cisplatin       04/04/2016 Adverse Reaction    Chemotherapy is placed on hold due to acute renal failure      04/13/2016 Adverse Reaction    Chemo was placed on hold due to shingles outbreak and paronychia       INTERVAL HISTORY: Please see below for problem oriented charting. She is seen prior to last dose  of chemotherapy With scopolamine patch, mucus production is less Her pain control is suboptimal She is not eating much by mouth due to pain. She is currently on 25 mcg of fentanyl, has not helped her much She denies nausea or vomiting.  No constipation She has minimum oral intake  and is relying on nutritional supplement through the feeding tube.  Her weight is stable  REVIEW OF SYSTEMS:   Constitutional: Denies fevers, chills or abnormal weight loss Eyes: Denies blurriness of vision Respiratory: Denies cough, dyspnea or wheezes Cardiovascular: Denies palpitation, chest discomfort or lower extremity swelling Gastrointestinal:  Denies nausea, heartburn or change in bowel habits Skin: Denies abnormal skin rashes Lymphatics: Denies new lymphadenopathy or easy bruising Neurological:Denies numbness, tingling or new weaknesses Behavioral/Psych: Mood is stable, no new changes  All other systems were reviewed with the patient and are negative.  I have reviewed the past medical history, past surgical history, social history and family history with the patient and they are unchanged from previous note.  ALLERGIES:  has No Known Allergies.  MEDICATIONS:  Current Outpatient Prescriptions  Medication Sig Dispense Refill  . ALPRAZolam (XANAX) 0.5 MG tablet Take 0.5 mg by mouth at bedtime as needed for sleep.     Marland Kitchen atorvastatin (LIPITOR) 10 MG tablet Take 10 mg by mouth daily at 6 PM.     . buPROPion (ZYBAN) 150 MG 12 hr tablet Take 150 mg by mouth daily.     . cephALEXin (KEFLEX) 250 MG/5ML suspension Place 10 mLs (500 mg total) into feeding tube 3 (three) times daily. 300 mL 0  . chlorhexidine (PERIDEX) 0.12 % solution Rinse with 15 mls three times daily for 30 seconds. Use after breakfast, dinner, and at bedtime. Spit out excess. Do not swallow. 480 mL prn  . fentaNYL (DURAGESIC - DOSED MCG/HR) 50 MCG/HR Place 1 patch (50 mcg total) onto the skin every 3 (three) days. 5 patch 0  . gabapentin (NEURONTIN) 300 MG capsule Take 1 capsule (300 mg total) by mouth 2 (two) times daily as needed. 28 capsule 0  . levothyroxine (SYNTHROID, LEVOTHROID) 75 MCG tablet Take 75 mcg by mouth daily before breakfast.     . lidocaine (XYLOCAINE) 2 % solution Patient: Mix 1part 2% viscous  lidocaine, 1part H20. Swish and swallow 27mL of this mixture, 8min before meals and at bedtime, up to QID 100 mL 5  . lidocaine (XYLOCAINE) 5 % ointment Apply 1 application topically as needed. Around feeding tube area for severe pain 50 g 0  . lidocaine-prilocaine (EMLA) cream Apply to affected area once 30 g 3  . LORazepam (ATIVAN) 1 MG tablet Take 1 tablet (1 mg total) by mouth every 8 (eight) hours as needed for anxiety or sleep (nausea). 30 tablet 0  . Morphine Sulfate (MORPHINE CONCENTRATE) 10 mg / 0.5 ml concentrated solution Take 0.5 mLs (10 mg total) by mouth every 2 (two) hours as needed for severe pain. 120 mL 0  . Nutritional Supplements (FEEDING SUPPLEMENT, OSMOLITE 1.5 CAL,) LIQD Begin Osmolite 1.5 via PEG, 1 can QID with 60 mL free water before and after. Increase to goal rate of 2 cans BID and 1.5 cans BID via PEG. Give additional 240 mL free water TID between feedings. Please send formula and supplies. 1659 mL 0  . ondansetron (ZOFRAN ODT) 8 MG disintegrating tablet Take 1 tablet (8 mg total) by mouth every 8 (eight) hours as needed for nausea or vomiting. 30 tablet 5  . ondansetron (ZOFRAN) 8 MG tablet  Take 1 tablet (8 mg total) by mouth 2 (two) times daily as needed. Start on the third day after chemotherapy. 30 tablet 1  . prochlorperazine (COMPAZINE) 10 MG tablet Take 1 tablet (10 mg total) by mouth every 6 (six) hours as needed (Nausea or vomiting). 30 tablet 1  . scopolamine (TRANSDERM-SCOP) 1 MG/3DAYS Place 1 patch (1.5 mg total) onto the skin every 3 (three) days. 10 patch 12  . senna (SENOKOT) 8.6 MG tablet Take 1 tablet by mouth 2 (two) times daily.    Marland Kitchen triamcinolone (KENALOG) 0.1 % paste APPLY TO AFFECTED AREA    . zolpidem (AMBIEN) 10 MG tablet Take 10 mg by mouth at bedtime.     Marland Kitchen HYDROcodone-acetaminophen (HYCET) 7.5-325 mg/15 ml solution Take 10-15 mLs by mouth every 4 (four) hours as needed for moderate pain. (Patient not taking: Reported on 04/26/2016) 473 mL 0   No  current facility-administered medications for this visit.    Facility-Administered Medications Ordered in Other Visits  Medication Dose Route Frequency Provider Last Rate Last Dose  . alteplase (CATHFLO ACTIVASE) injection 2 mg  2 mg Intracatheter Once PRN [name], MD      . heparin lock flush 100 unit/mL  500 Units Intracatheter Once PRN [name], MD      . ondansetron (ZOFRAN) injection 8 mg  8 mg Intravenous Once [name], MD      . sodium chloride flush (NS) 0.9 % injection 10 mL  10 mL Intracatheter PRN [name], MD        PHYSICAL EXAMINATION: ECOG PERFORMANCE STATUS: 1 - Symptomatic but completely ambulatory  Vitals:   04/26/16 1031  BP: (!) 151/69  Pulse: 100  Resp: 20  Temp: 98.7 F (37.1 C)   Filed Weights   04/26/16 1031  Weight: 204 lb 12.8 oz (92.9 kg)    GENERAL:alert, no distress and comfortable SKIN: skin color, texture, turgor are normal, no rashes or significant lesions EYES: normal, Conjunctiva are pink and non-injected, sclera clear OROPHARYNX: Noticed signs of mucositis.  No thrush NECK: supple, thyroid normal size, non-tender, without nodularity LYMPH:  no palpable lymphadenopathy in the cervical, axillary or inguinal LUNGS: clear to auscultation and percussion with normal breathing effort HEART: regular rate & rhythm and no murmurs and no lower extremity edema ABDOMEN:abdomen soft, non-tender and normal bowel sounds Musculoskeletal:no cyanosis of digits and no clubbing  NEURO: alert & oriented x 3 with fluent speech, no focal motor/sensory deficits  LABORATORY DATA:  I have reviewed the data as listed    Component Value Date/Time   NA 140 04/25/2016 1023   K 3.6 04/25/2016 1023   CL 99 (L) 02/07/2016 0836   CO2 29 04/25/2016 1023   GLUCOSE 101 04/25/2016 1023   BUN 12.0 04/25/2016 1023   CREATININE 1.2 (H) 04/25/2016 1023   CALCIUM 9.3 04/25/2016 1023   PROT 7.2 04/25/2016 1023   ALBUMIN 3.5 04/25/2016 1023   AST 25 04/25/2016 1023    ALT 14 04/25/2016 1023   ALKPHOS 107 04/25/2016 1023   BILITOT 0.66 04/25/2016 1023   GFRNONAA 45 (L) 02/08/2016 1859   GFRAA 52 (L) 02/08/2016 1859    No results found for: SPEP, UPEP  Lab Results  Component Value Date   WBC 3.5 (L) 04/25/2016   NEUTROABS 2.6 04/25/2016   HGB 11.1 (L) 04/25/2016   HCT 32.0 (L) 04/25/2016   MCV 84.1 04/25/2016   PLT 212 04/25/2016      Chemistry  Component Value Date/Time   NA 140 04/25/2016 1023   K 3.6 04/25/2016 1023   CL 99 (L) 02/07/2016 0836   CO2 29 04/25/2016 1023   BUN 12.0 04/25/2016 1023   CREATININE 1.2 (H) 04/25/2016 1023      Component Value Date/Time   CALCIUM 9.3 04/25/2016 1023   ALKPHOS 107 04/25/2016 1023   AST 25 04/25/2016 1023   ALT 14 04/25/2016 1023   BILITOT 0.66 04/25/2016 1023      ASSESSMENT & PLAN:  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   I will continue chemotherapy this week with dose modification She will continue blood draw weekly I will continue to see her weekly for supportive care  Acute renal failure ([place]) This is secondary to poor oral intake, dehydration and recent chemotherapy. With aggressive IV fluid resuscitation, her serum creatinine is improving. She will resume chemotherapy tomorrow with 50% dose adjustment  Cancer associated pain Pain control is suboptimal I will increase fentanyl to 50 mcg and reassess pain control next week She will continue breakthrough liquid morphine sulfate  Continuous salivary secretion Mucus production is less with scopolamine patch.  Continue the same  Pancytopenia, acquired [facility]) This is likely due to side effects of treatment. She is not symptomatic. She does not need blood transfusion. We will continue dose modification as above   No orders of the defined types were placed in this encounter.  All questions were answered. The patient knows to  call the clinic with any problems, questions or concerns. No barriers to learning was detected. I spent 25 minutes counseling the patient face to face. The total time spent in the appointment was 30 minutes and more than 50% was on counseling and review of test results     [name], MD 04/26/2016 12:45 PM

## 2016-04-27 ENCOUNTER — Ambulatory Visit: Payer: 59 | Admitting: Nutrition

## 2016-04-27 ENCOUNTER — Ambulatory Visit
Admission: RE | Admit: 2016-04-27 | Discharge: 2016-04-27 | Disposition: A | Discharge status: Home / Self Care | Payer: 59 | Source: Ambulatory Visit | Attending: Radiation Oncology | Admitting: Radiation Oncology

## 2016-04-27 ENCOUNTER — Ambulatory Visit (HOSPITAL_BASED_OUTPATIENT_CLINIC_OR_DEPARTMENT_OTHER): Payer: 59

## 2016-04-27 DIAGNOSIS — C023 Malignant neoplasm of anterior two-thirds of tongue, part unspecified: Secondary | ICD-10-CM | POA: Diagnosis not present

## 2016-04-27 DIAGNOSIS — Z5111 Encounter for antineoplastic chemotherapy: Secondary | ICD-10-CM | POA: Diagnosis not present

## 2016-04-27 DIAGNOSIS — Z510 Encounter for antineoplastic radiation therapy: Secondary | ICD-10-CM | POA: Diagnosis not present

## 2016-04-27 MED ORDER — SODIUM CHLORIDE 0.9% FLUSH
10.0000 mL | INTRAVENOUS | Status: DC | PRN
  Administered 2016-04-27: 10 mL
  Filled 2016-04-27: qty 10

## 2016-04-27 MED ORDER — PALONOSETRON HCL INJECTION 0.25 MG/5ML
INTRAVENOUS | Status: AC
  Filled 2016-04-27: qty 5

## 2016-04-27 MED ORDER — HEPARIN SOD (PORK) LOCK FLUSH 100 UNIT/ML IV SOLN
500.0000 [IU] | Freq: Once | INTRAVENOUS | Status: AC | PRN
  Administered 2016-04-27: 500 [IU]
  Filled 2016-04-27: qty 5

## 2016-04-27 MED ORDER — SODIUM CHLORIDE 0.9 % IV SOLN
20.0000 mg/m2 | Freq: Once | INTRAVENOUS | Status: AC
  Administered 2016-04-27: 44 mg via INTRAVENOUS
  Filled 2016-04-27: qty 44

## 2016-04-27 MED ORDER — SODIUM CHLORIDE 0.9 % IV SOLN
Freq: Once | INTRAVENOUS | Status: AC
  Administered 2016-04-27: 13:00:00 via INTRAVENOUS
  Filled 2016-04-27: qty 5

## 2016-04-27 MED ORDER — PALONOSETRON HCL INJECTION 0.25 MG/5ML
0.2500 mg | Freq: Once | INTRAVENOUS | Status: AC
  Administered 2016-04-27: 0.25 mg via INTRAVENOUS

## 2016-04-27 MED ORDER — SODIUM CHLORIDE 0.9 % IV SOLN
Freq: Once | INTRAVENOUS | Status: AC
  Administered 2016-04-27: 11:00:00 via INTRAVENOUS

## 2016-04-27 MED ORDER — POTASSIUM CHLORIDE 2 MEQ/ML IV SOLN
Freq: Once | INTRAVENOUS | Status: AC
  Administered 2016-04-27: 11:00:00 via INTRAVENOUS
  Filled 2016-04-27: qty 10

## 2016-04-27 NOTE — Progress Notes (Signed)
Pt void total of 258ml

## 2016-04-27 NOTE — Patient Instructions (Signed)
[facility] Discharge Instructions for Patients Receiving Chemotherapy  Today you received the following chemotherapy agents: Cisplatin   To help prevent nausea and vomiting after your treatment, we encourage you to take your nausea medication as directed.    If you develop nausea and vomiting that is not controlled by your nausea medication, call the clinic.   BELOW ARE SYMPTOMS THAT SHOULD BE REPORTED IMMEDIATELY:  *FEVER GREATER THAN 100.5 F  *CHILLS WITH OR WITHOUT FEVER  NAUSEA AND VOMITING THAT IS NOT CONTROLLED WITH YOUR NAUSEA MEDICATION  *UNUSUAL SHORTNESS OF BREATH  *UNUSUAL BRUISING OR BLEEDING  TENDERNESS IN MOUTH AND THROAT WITH OR WITHOUT PRESENCE OF ULCERS  *URINARY PROBLEMS  *BOWEL PROBLEMS  UNUSUAL RASH Items with * indicate a potential emergency and should be followed up as soon as possible.  Feel free to call the clinic you have any questions or concerns. The clinic phone number is [phone].  Please show the CHEMO ALERT CARD at check-in to the Emergency Department and triage nurse.   

## 2016-04-27 NOTE — Progress Notes (Signed)
Nutrition follow-up completed with patient during infusion for tongue cancer. Patient is unable to eat or drink by mouth. Weight decreased and documented as 204.8 pounds in April 26 decreased from 206.6 pounds in April 19 Patient is tolerating approximately 5 cans of Ensure Plus daily. She is unable to tolerate Osmolite 1.5 without vomiting.  Nutrition diagnosis: Unintended weight loss continues. Severe malnutrition continues.  Estimated nutrition needs: 2300-2500 calories, 120-135 grams protein, 2.5 L.  Intervention: Educated patient to try to increase Ensure Plus 1-1/2 cans 4 times a day every 4 hours with 60 cc free water before and after bolus feeding. This will provide 2100 cal, 78 g protein, 1692 mL free water. Patient educated to give an additional 240 cc of free water 3 times a day by mouth or via tube. Questions answered.  Teach back method used.  Monitoring, evaluation, goals: Patient will increase tube feeding as tolerated to meet greater than 90% of estimated calorie needs.  Next visit: Friday May for during IV fluids.  **Disclaimer: This note was dictated with voice recognition software. Similar sounding words can inadvertently be transcribed and this note may contain transcription errors which may not have been corrected upon publication of note.**

## 2016-04-28 ENCOUNTER — Ambulatory Visit (HOSPITAL_BASED_OUTPATIENT_CLINIC_OR_DEPARTMENT_OTHER): Payer: 59

## 2016-04-28 VITALS — BP 120/74 | HR 100 | Temp 98.4°F | Resp 19

## 2016-04-28 DIAGNOSIS — C023 Malignant neoplasm of anterior two-thirds of tongue, part unspecified: Secondary | ICD-10-CM

## 2016-04-28 MED ORDER — SODIUM CHLORIDE 0.9% FLUSH
10.0000 mL | INTRAVENOUS | Status: DC | PRN
  Filled 2016-04-28: qty 10

## 2016-04-28 MED ORDER — HEPARIN SOD (PORK) LOCK FLUSH 100 UNIT/ML IV SOLN
500.0000 [IU] | Freq: Once | INTRAVENOUS | Status: DC | PRN
Start: 2016-04-28 — End: 2017-03-14
  Filled 2016-04-28: qty 5

## 2016-04-28 MED ORDER — SODIUM CHLORIDE 0.9 % IV SOLN
Freq: Once | INTRAVENOUS | Status: AC
  Administered 2016-04-28: 09:00:00 via INTRAVENOUS

## 2016-04-28 MED ORDER — SODIUM CHLORIDE 0.9 % IV SOLN
Freq: Once | INTRAVENOUS | Status: AC
  Administered 2016-04-28: 09:00:00 via INTRAVENOUS
  Filled 2016-04-28: qty 4

## 2016-04-28 MED ORDER — ONDANSETRON HCL 4 MG/2ML IJ SOLN
INTRAMUSCULAR | Status: AC
  Filled 2016-04-28: qty 4

## 2016-04-30 ENCOUNTER — Ambulatory Visit
Admission: RE | Admit: 2016-04-30 | Discharge: 2016-04-30 | Disposition: A | Discharge status: Home / Self Care | Payer: 59 | Source: Ambulatory Visit | Attending: Radiation Oncology | Admitting: Radiation Oncology

## 2016-04-30 ENCOUNTER — Ambulatory Visit: Payer: 59

## 2016-04-30 ENCOUNTER — Encounter: Payer: Self-pay | Admitting: Radiation Oncology

## 2016-04-30 DIAGNOSIS — C023 Malignant neoplasm of anterior two-thirds of tongue, part unspecified: Secondary | ICD-10-CM

## 2016-04-30 DIAGNOSIS — Z510 Encounter for antineoplastic radiation therapy: Secondary | ICD-10-CM | POA: Diagnosis not present

## 2016-04-30 MED ORDER — BIAFINE EX EMUL
CUTANEOUS | Status: DC | PRN
  Administered 2016-04-30: 16:00:00 via TOPICAL

## 2016-05-01 ENCOUNTER — Ambulatory Visit: Payer: 59

## 2016-05-01 NOTE — Progress Notes (Signed)
  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   Beams/energy:    IMRT / 6 MV photons  Narrative: The patient tolerated radiation treatment relatively well.  The patient developed hyperpigmentation over the face and neck. She reported pain to her throat and is using a fentanyl patch and morphine for pain relief. She was not taking anything orally. She is instilling a nutritional supplement and water through her PEG tube. She stated feeling sick and nauseated. Antiemetics were adjusted.  She is scheduled for IVF 3 times weekly for 3 weeks with Dr. Alvy Bimler.   Plan: The patient has completed radiation treatment. The patient will return to radiation oncology clinic for routine followup in one half month. She is not taking anti emetics around the clock and I instructed her to do so. Refer back to SLP. IVF per med/onc. I advised the patient to call or return sooner if any questions or concerns arise that are related to recovery or treatment.  -----------------------------------  Eppie Gibson, MD   This document serves as a record of services personally performed by Eppie Gibson, MD. It was created on her behalf by Arlyce Harman, a trained medical scribe. The creation of this record is based on the scribe's personal observations and the provider's  statements to them. This document has been checked and approved by the attending provider.

## 2016-05-02 ENCOUNTER — Other Ambulatory Visit: Payer: Self-pay

## 2016-05-02 ENCOUNTER — Ambulatory Visit: Payer: 59

## 2016-05-02 ENCOUNTER — Ambulatory Visit (HOSPITAL_BASED_OUTPATIENT_CLINIC_OR_DEPARTMENT_OTHER): Payer: 59

## 2016-05-02 ENCOUNTER — Ambulatory Visit: Payer: Self-pay | Admitting: Hematology and Oncology

## 2016-05-02 DIAGNOSIS — R110 Nausea: Secondary | ICD-10-CM

## 2016-05-02 DIAGNOSIS — C023 Malignant neoplasm of anterior two-thirds of tongue, part unspecified: Secondary | ICD-10-CM

## 2016-05-02 MED ORDER — SODIUM CHLORIDE 0.9 % IV SOLN
Freq: Once | INTRAVENOUS | Status: AC
  Administered 2016-05-02: 12:00:00 via INTRAVENOUS

## 2016-05-02 MED ORDER — SODIUM CHLORIDE 0.9 % IV SOLN: Freq: Once | INTRAVENOUS | Status: DC

## 2016-05-02 MED ORDER — HEPARIN SOD (PORK) LOCK FLUSH 100 UNIT/ML IV SOLN
500.0000 [IU] | Freq: Once | INTRAVENOUS | Status: AC | PRN
Start: 2016-05-02 — End: 2016-05-02
  Administered 2016-05-02: 500 [IU]
  Filled 2016-05-02: qty 5

## 2016-05-02 MED ORDER — SODIUM CHLORIDE 0.9% FLUSH
10.0000 mL | INTRAVENOUS | Status: DC | PRN
  Administered 2016-05-02: 10 mL
  Filled 2016-05-02: qty 10

## 2016-05-02 MED ORDER — ONDANSETRON HCL 4 MG/2ML IJ SOLN
8.0000 mg | Freq: Once | INTRAMUSCULAR | Status: AC
  Administered 2016-05-02: 8 mg via INTRAVENOUS

## 2016-05-02 MED ORDER — ONDANSETRON HCL 4 MG/2ML IJ SOLN
INTRAMUSCULAR | Status: AC
Start: 2016-05-02 — End: 2016-05-02
  Filled 2016-05-02: qty 4

## 2016-05-02 NOTE — Patient Instructions (Signed)
Dehydration, Adult Dehydration is a condition in which there is not enough fluid or water in the body. This happens when you lose more fluids than you take in. Important organs, such as the kidneys, brain, and heart, cannot function without a proper amount of fluids. Any loss of fluids from the body can lead to dehydration. Dehydration can range from mild to severe. This condition should be treated right away to prevent it from becoming severe. What are the causes? This condition may be caused by:  Vomiting.  Diarrhea.  Excessive sweating, such as from heat exposure or exercise.  Not drinking enough fluid, especially:  When ill.  While doing activity that requires a lot of energy.  Excessive urination.  Fever.  Infection.  Certain medicines, such as medicines that cause the body to lose excess fluid (diuretics).  Inability to access safe drinking water.  Reduced physical ability to get adequate water and food. What increases the risk? This condition is more likely to develop in people:  Who have a poorly controlled long-term (chronic) illness, such as diabetes, heart disease, or kidney disease.  Who are age 65 or older.  Who are disabled.  Who live in a place with high altitude.  Who play endurance sports. What are the signs or symptoms? Symptoms of mild dehydration may include:   Thirst.  Dry lips.  Slightly dry mouth.  Dry, warm skin.  Dizziness. Symptoms of moderate dehydration may include:   Very dry mouth.  Muscle cramps.  Dark urine. Urine may be the color of tea.  Decreased urine production.  Decreased tear production.  Heartbeat that is irregular or faster than normal (palpitations).  Headache.  Light-headedness, especially when you stand up from a sitting position.  Fainting (syncope). Symptoms of severe dehydration may include:   Changes in skin, such as:  Cold and clammy skin.  Blotchy (mottled) or pale skin.  Skin that does  not quickly return to normal after being lightly pinched and released (poor skin turgor).  Changes in body fluids, such as:  Extreme thirst.  No tear production.  Inability to sweat when body temperature is high, such as in hot weather.  Very little urine production.  Changes in vital signs, such as:  Weak pulse.  Pulse that is more than 100 beats a minute when sitting still.  Rapid breathing.  Low blood pressure.  Other changes, such as:  Sunken eyes.  Cold hands and feet.  Confusion.  Lack of energy (lethargy).  Difficulty waking up from sleep.  Short-term weight loss.  Unconsciousness. How is this diagnosed? This condition is diagnosed based on your symptoms and a physical exam. Blood and urine tests may be done to help confirm the diagnosis. How is this treated? Treatment for this condition depends on the severity. Mild or moderate dehydration can often be treated at home. Treatment should be started right away. Do not wait until dehydration becomes severe. Severe dehydration is an emergency and it needs to be treated in a hospital. Treatment for mild dehydration may include:   Drinking more fluids.  Replacing salts and minerals in your blood (electrolytes) that you may have lost. Treatment for moderate dehydration may include:   Drinking an oral rehydration solution (ORS). This is a drink that helps you replace fluids and electrolytes (rehydrate). It can be found at pharmacies and retail stores. Treatment for severe dehydration may include:   Receiving fluids through an IV tube.  Receiving an electrolyte solution through a feeding tube that is   passed through your nose and into your stomach (nasogastric tube, or NG tube).  Correcting any abnormalities in electrolytes.  Treating the underlying cause of dehydration. Follow these instructions at home:  If directed by your health care provider, drink an ORS:  Make an ORS by following instructions on the  package.  Start by drinking small amounts, about  cup (120 mL) every 5-10 minutes.  Slowly increase how much you drink until you have taken the amount recommended by your health care provider.  Drink enough clear fluid to keep your urine clear or pale yellow. If you were told to drink an ORS, finish the ORS first, then start slowly drinking other clear fluids. Drink fluids such as:  Water. Do not drink only water. Doing that can lead to having too little salt (sodium) in the body (hyponatremia).  Ice chips.  Fruit juice that you have added water to (diluted fruit juice).  Low-calorie sports drinks.  Avoid:  Alcohol.  Drinks that contain a lot of sugar. These include high-calorie sports drinks, fruit juice that is not diluted, and soda.  Caffeine.  Foods that are greasy or contain a lot of fat or sugar.  Take over-the-counter and prescription medicines only as told by your health care provider.  Do not take sodium tablets. This can lead to having too much sodium in the body (hypernatremia).  Eat foods that contain a healthy balance of electrolytes, such as bananas, oranges, potatoes, tomatoes, and spinach.  Keep all follow-up visits as told by your health care provider. This is important. Contact a health care provider if:  You have abdominal pain that:  Gets worse.  Stays in one area (localizes).  You have a rash.  You have a stiff neck.  You are more irritable than usual.  You are sleepier or more difficult to wake up than usual.  You feel weak or dizzy.  You feel very thirsty.  You have urinated only a small amount of very dark urine over 6-8 hours. Get help right away if:  You have symptoms of severe dehydration.  You cannot drink fluids without vomiting.  Your symptoms get worse with treatment.  You have a fever.  You have a severe headache.  You have vomiting or diarrhea that:  Gets worse.  Does not go away.  You have blood or green matter  (bile) in your vomit.  You have blood in your stool. This may cause stool to look black and tarry.  You have not urinated in 6-8 hours.  You faint.  Your heart rate while sitting still is over 100 beats a minute.  You have trouble breathing. This information is not intended to replace advice given to you by your health care provider. Make sure you discuss any questions you have with your health care provider. Document Released: 12/18/2004 Document Revised: 07/15/2015 Document Reviewed: 02/11/2015 Elsevier Interactive Patient Education  2017 [employer]  

## 2016-05-02 NOTE — Progress Notes (Signed)
Pt missed Monday's IV fluid. States she had to go home after XRT because her daughter had to go to work. Then pt did not feel well enough to come back for IV fluids. Had nausea that took some time to get under control. Pt taking Zofran 8 mg ODT every 8 hours only. Advised pt that she could also take compazine 10 mg in between her zofran doses to aid in controlling nausea. Pt voiced understanding.  OK to infuse fluids @ 750/hr per Dr. Alvy Bimler. Requested additional 500 mls of IV fluid as pt had not taken as much PEG tube feedings in last couple of days. 7 lb weight loss since 04/26/16  OK per Dr. Alvy Bimler to give additional 500 mls of fluid today.

## 2016-05-04 ENCOUNTER — Other Ambulatory Visit: Payer: Self-pay | Admitting: Hematology and Oncology

## 2016-05-04 ENCOUNTER — Encounter: Payer: Self-pay | Admitting: *Deleted

## 2016-05-04 ENCOUNTER — Encounter: Payer: Self-pay | Admitting: Hematology and Oncology

## 2016-05-04 ENCOUNTER — Ambulatory Visit: Payer: 59

## 2016-05-04 ENCOUNTER — Ambulatory Visit: Payer: 59 | Admitting: Nutrition

## 2016-05-04 ENCOUNTER — Ambulatory Visit (HOSPITAL_BASED_OUTPATIENT_CLINIC_OR_DEPARTMENT_OTHER): Payer: 59

## 2016-05-04 ENCOUNTER — Other Ambulatory Visit (HOSPITAL_BASED_OUTPATIENT_CLINIC_OR_DEPARTMENT_OTHER): Payer: 59

## 2016-05-04 ENCOUNTER — Ambulatory Visit (HOSPITAL_BASED_OUTPATIENT_CLINIC_OR_DEPARTMENT_OTHER): Payer: 59 | Admitting: Hematology and Oncology

## 2016-05-04 DIAGNOSIS — R634 Abnormal weight loss: Secondary | ICD-10-CM | POA: Diagnosis not present

## 2016-05-04 DIAGNOSIS — C023 Malignant neoplasm of anterior two-thirds of tongue, part unspecified: Secondary | ICD-10-CM

## 2016-05-04 DIAGNOSIS — G893 Neoplasm related pain (acute) (chronic): Secondary | ICD-10-CM | POA: Diagnosis not present

## 2016-05-04 HISTORY — DX: Hypomagnesemia: E83.42

## 2016-05-04 LAB — CBC WITH DIFFERENTIAL/PLATELET
BASO%: 0.6 % (ref 0.0–2.0)
BASOS ABS: 0 10*3/uL (ref 0.0–0.1)
EOS ABS: 0.1 10*3/uL (ref 0.0–0.5)
EOS%: 1.1 % (ref 0.0–7.0)
HEMATOCRIT: 32.6 % — AB (ref 34.8–46.6)
HEMOGLOBIN: 11.4 g/dL — AB (ref 11.6–15.9)
LYMPH#: 0.3 10*3/uL — AB (ref 0.9–3.3)
LYMPH%: 3.8 % — ABNORMAL LOW (ref 14.0–49.7)
MCH: 29.5 pg (ref 25.1–34.0)
MCHC: 35.1 g/dL (ref 31.5–36.0)
MCV: 84.1 fL (ref 79.5–101.0)
MONO#: 0.9 10*3/uL (ref 0.1–0.9)
MONO%: 13.4 % (ref 0.0–14.0)
NEUT%: 81.1 % — ABNORMAL HIGH (ref 38.4–76.8)
NEUTROS ABS: 5.5 10*3/uL (ref 1.5–6.5)
PLATELETS: 233 10*3/uL (ref 145–400)
RBC: 3.88 10*6/uL (ref 3.70–5.45)
RDW: 17.4 % — AB (ref 11.2–14.5)
WBC: 6.7 10*3/uL (ref 3.9–10.3)

## 2016-05-04 LAB — COMPREHENSIVE METABOLIC PANEL
ALT: 20 U/L (ref 0–55)
ANION GAP: 13 meq/L — AB (ref 3–11)
AST: 31 U/L (ref 5–34)
Albumin: 3.5 g/dL (ref 3.5–5.0)
Alkaline Phosphatase: 101 U/L (ref 40–150)
BUN: 13.1 mg/dL (ref 7.0–26.0)
CHLORIDE: 99 meq/L (ref 98–109)
CO2: 28 meq/L (ref 22–29)
Calcium: 9.4 mg/dL (ref 8.4–10.4)
Creatinine: 1.1 mg/dL (ref 0.6–1.1)
EGFR: 64 mL/min/{1.73_m2} — AB (ref >90)
GLUCOSE: 96 mg/dL (ref 70–140)
POTASSIUM: 3.6 meq/L (ref 3.5–5.1)
SODIUM: 140 meq/L (ref 136–145)
Total Bilirubin: 0.69 mg/dL (ref 0.20–1.20)
Total Protein: 7.1 g/dL (ref 6.4–8.3)

## 2016-05-04 LAB — MAGNESIUM: Magnesium: 1.4 mg/dl — CL (ref 1.5–2.5)

## 2016-05-04 MED ORDER — MAGNESIUM SULFATE 50 % IJ SOLN: 2.0000 g | Freq: Once | INTRAMUSCULAR | Status: DC

## 2016-05-04 MED ORDER — SODIUM CHLORIDE 0.9% FLUSH
10.0000 mL | INTRAVENOUS | Status: DC | PRN
  Administered 2016-05-04: 10 mL
  Filled 2016-05-04: qty 10

## 2016-05-04 MED ORDER — ONDANSETRON HCL 4 MG/2ML IJ SOLN
8.0000 mg | Freq: Once | INTRAMUSCULAR | Status: AC
  Administered 2016-05-04: 8 mg via INTRAVENOUS

## 2016-05-04 MED ORDER — MORPHINE SULFATE (CONCENTRATE) 20 MG/ML PO SOLN: 20.0000 mg | ORAL | 0 refills | Status: DC | PRN

## 2016-05-04 MED ORDER — LORAZEPAM 1 MG PO TABS: 1.0000 mg | ORAL_TABLET | Freq: Three times a day (TID) | ORAL | 0 refills | Status: DC | PRN

## 2016-05-04 MED ORDER — SODIUM CHLORIDE 0.9 % IV SOLN
Freq: Once | INTRAVENOUS | Status: AC
  Administered 2016-05-04: 12:00:00 via INTRAVENOUS
  Filled 2016-05-04: qty 1000

## 2016-05-04 MED ORDER — ONDANSETRON HCL 4 MG/2ML IJ SOLN
INTRAMUSCULAR | Status: AC
  Filled 2016-05-04: qty 4

## 2016-05-04 MED ORDER — SODIUM CHLORIDE 0.9 % IV SOLN: Freq: Once | INTRAVENOUS | Status: DC

## 2016-05-04 MED ORDER — FENTANYL 75 MCG/HR TD PT72
75.0000 ug | MEDICATED_PATCH | TRANSDERMAL | 0 refills | Status: DC
Start: 2016-05-04 — End: 2016-06-04

## 2016-05-04 MED ORDER — HEPARIN SOD (PORK) LOCK FLUSH 100 UNIT/ML IV SOLN
500.0000 [IU] | Freq: Once | INTRAVENOUS | Status: AC | PRN
  Administered 2016-05-04: 500 [IU]
  Filled 2016-05-04: qty 5

## 2016-05-04 MED FILL — fentaNYL 75 MCG/HR PT72: 75 | 15 days supply | Qty: 5 | Fill #0

## 2016-05-04 MED FILL — MORPHINE SULF 100 MG/5 ML S: 100 | 10 days supply | Qty: 120 | Fill #0

## 2016-05-04 MED FILL — LORazepam 1 MG TABS: 1 | 20 days supply | Qty: 60 | Fill #0

## 2016-05-04 NOTE — Progress Notes (Signed)
Oncology Nurse Navigator Documentation  Met with Tiffany Yu during Established Patient appt with Dr. Alvy Bimler. She reported:  Continuing thick saliva.  I encouraged increased use of salt water/baking soda rinse to help manage.  Continuing throat pain.  She received new Rx from Dr. Alvy Bimler.  Using PEG for hydration and nutrition, instilling ca 4 1/2 cans supplement daily.  She is being seen by Dory Peru, RD, in [facility] later today.  Oral intake limited to water, freeze pops.  I answered her questions re changes in SEs post-RT.  We discussed importance of f/u appts with SLP, Nutrition; protecting treatment area from sunlight. I returned to her completed additional FMLA paperwork submitted by her daughter, called daughter later to inform forms faxed 5/2 to employer HR. She understands my navigation will continue for the next 6 months, to call me with questions/concerns.  [name], RN, BSN, [place] Oncology Nurse [place] [facility] [place] [phone]

## 2016-05-04 NOTE — Patient Instructions (Signed)
Dehydration, Adult Dehydration is a condition in which there is not enough fluid or water in the body. This happens when you lose more fluids than you take in. Important organs, such as the kidneys, brain, and heart, cannot function without a proper amount of fluids. Any loss of fluids from the body can lead to dehydration. Dehydration can range from mild to severe. This condition should be treated right away to prevent it from becoming severe. What are the causes? This condition may be caused by:  Vomiting.  Diarrhea.  Excessive sweating, such as from heat exposure or exercise.  Not drinking enough fluid, especially:  When ill.  While doing activity that requires a lot of energy.  Excessive urination.  Fever.  Infection.  Certain medicines, such as medicines that cause the body to lose excess fluid (diuretics).  Inability to access safe drinking water.  Reduced physical ability to get adequate water and food. What increases the risk? This condition is more likely to develop in people:  Who have a poorly controlled long-term (chronic) illness, such as diabetes, heart disease, or kidney disease.  Who are age 65 or older.  Who are disabled.  Who live in a place with high altitude.  Who play endurance sports. What are the signs or symptoms? Symptoms of mild dehydration may include:   Thirst.  Dry lips.  Slightly dry mouth.  Dry, warm skin.  Dizziness. Symptoms of moderate dehydration may include:   Very dry mouth.  Muscle cramps.  Dark urine. Urine may be the color of tea.  Decreased urine production.  Decreased tear production.  Heartbeat that is irregular or faster than normal (palpitations).  Headache.  Light-headedness, especially when you stand up from a sitting position.  Fainting (syncope). Symptoms of severe dehydration may include:   Changes in skin, such as:  Cold and clammy skin.  Blotchy (mottled) or pale skin.  Skin that does  not quickly return to normal after being lightly pinched and released (poor skin turgor).  Changes in body fluids, such as:  Extreme thirst.  No tear production.  Inability to sweat when body temperature is high, such as in hot weather.  Very little urine production.  Changes in vital signs, such as:  Weak pulse.  Pulse that is more than 100 beats a minute when sitting still.  Rapid breathing.  Low blood pressure.  Other changes, such as:  Sunken eyes.  Cold hands and feet.  Confusion.  Lack of energy (lethargy).  Difficulty waking up from sleep.  Short-term weight loss.  Unconsciousness. How is this diagnosed? This condition is diagnosed based on your symptoms and a physical exam. Blood and urine tests may be done to help confirm the diagnosis. How is this treated? Treatment for this condition depends on the severity. Mild or moderate dehydration can often be treated at home. Treatment should be started right away. Do not wait until dehydration becomes severe. Severe dehydration is an emergency and it needs to be treated in a hospital. Treatment for mild dehydration may include:   Drinking more fluids.  Replacing salts and minerals in your blood (electrolytes) that you may have lost. Treatment for moderate dehydration may include:   Drinking an oral rehydration solution (ORS). This is a drink that helps you replace fluids and electrolytes (rehydrate). It can be found at pharmacies and retail stores. Treatment for severe dehydration may include:   Receiving fluids through an IV tube.  Receiving an electrolyte solution through a feeding tube that is   passed through your nose and into your stomach (nasogastric tube, or NG tube).  Correcting any abnormalities in electrolytes.  Treating the underlying cause of dehydration. Follow these instructions at home:  If directed by your health care provider, drink an ORS:  Make an ORS by following instructions on the  package.  Start by drinking small amounts, about  cup (120 mL) every 5-10 minutes.  Slowly increase how much you drink until you have taken the amount recommended by your health care provider.  Drink enough clear fluid to keep your urine clear or pale yellow. If you were told to drink an ORS, finish the ORS first, then start slowly drinking other clear fluids. Drink fluids such as:  Water. Do not drink only water. Doing that can lead to having too little salt (sodium) in the body (hyponatremia).  Ice chips.  Fruit juice that you have added water to (diluted fruit juice).  Low-calorie sports drinks.  Avoid:  Alcohol.  Drinks that contain a lot of sugar. These include high-calorie sports drinks, fruit juice that is not diluted, and soda.  Caffeine.  Foods that are greasy or contain a lot of fat or sugar.  Take over-the-counter and prescription medicines only as told by your health care provider.  Do not take sodium tablets. This can lead to having too much sodium in the body (hypernatremia).  Eat foods that contain a healthy balance of electrolytes, such as bananas, oranges, potatoes, tomatoes, and spinach.  Keep all follow-up visits as told by your health care provider. This is important. Contact a health care provider if:  You have abdominal pain that:  Gets worse.  Stays in one area (localizes).  You have a rash.  You have a stiff neck.  You are more irritable than usual.  You are sleepier or more difficult to wake up than usual.  You feel weak or dizzy.  You feel very thirsty.  You have urinated only a small amount of very dark urine over 6-8 hours. Get help right away if:  You have symptoms of severe dehydration.  You cannot drink fluids without vomiting.  Your symptoms get worse with treatment.  You have a fever.  You have a severe headache.  You have vomiting or diarrhea that:  Gets worse.  Does not go away.  You have blood or green matter  (bile) in your vomit.  You have blood in your stool. This may cause stool to look black and tarry.  You have not urinated in 6-8 hours.  You faint.  Your heart rate while sitting still is over 100 beats a minute.  You have trouble breathing. This information is not intended to replace advice given to you by your health care provider. Make sure you discuss any questions you have with your health care provider. Document Released: 12/18/2004 Document Revised: 07/15/2015 Document Reviewed: 02/11/2015 Elsevier Interactive Patient Education  2017 [employer]  

## 2016-05-04 NOTE — Progress Notes (Signed)
Nutrition follow-up completed with patient during IV fluids status post treatment for tongue cancer. Weight has decreased significantly and was documented as 197.4 pounds on May 4 down from 204.8 pounds April 26. Patient states she is only able to tolerate approximately 4 cans of Ensure Plus. She understands her goal rate is 7 cans daily. Patient continues to complain of ropy saliva limiting her ability to swallow. She continues to have nausea and vomiting.  Nutrition diagnosis:  Unintended weight loss and severe malnutrition.  Continue.  Estimated nutrition needs:  2300-2500 calories, 120-135 grams protein, 2.5 L fluid.  Intervention: Encouraged patient to consider continuous tube feeding to improve tube feeding tolerance and volume. Patient is refusing this at this time and wants to try to increase bolus feeding. Patient verbalizes goal rate of tube feeding is 7 cans of Ensure Plus daily. Patient wants to try a suction machine if M.D. will order to help relieve thick, ropy saliva. Message sent to head and neck navigator. Teach back method used.  Monitoring, evaluation, goals:  Patient will work to increase tube feeding tolerance and increase oral intake to minimize further weight loss.  Next visit: Monday, May 7, during infusion.  **Disclaimer: This note was dictated with voice recognition software. Similar sounding words can inadvertently be transcribed and this note may contain transcription errors which may not have been corrected upon publication of note.**

## 2016-05-05 ENCOUNTER — Telehealth: Payer: Self-pay | Admitting: Hematology and Oncology

## 2016-05-05 NOTE — Assessment & Plan Note (Signed)
She has completed recent chemoradiation treatment. She is still experiencing significant side effects of treatment I will continue to see her on a weekly basis for supportive care

## 2016-05-05 NOTE — Assessment & Plan Note (Signed)
She has hypomagnesemia due to cisplatin I will give her IV magnesium while she is getting IV fluids

## 2016-05-05 NOTE — Telephone Encounter (Signed)
Per 5/5 schedule message added f/u 5/9 and additional appointments for fluids. Patient to get new schedule in [facility] at next fluid visit 5/7.

## 2016-05-05 NOTE — Assessment & Plan Note (Signed)
She has progressive weight loss I continue to encourage her to try oral intake if tolerated She is reminded that she needs at least 6-7 cans of nutritional supplement through the feeding tube to maintain her weight

## 2016-05-05 NOTE — Assessment & Plan Note (Signed)
She has poorly controlled mucositis pain I plan to increase fentanyl patch to 75 mcg and increased the dose of breakthrough morphine sulfate I will see her back next week in continue to assess pain control

## 2016-05-05 NOTE — Progress Notes (Signed)
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 Tongue, biopsy, Anterior deep margin left SQUAMOUS CELL CARCINOMA 3. Tongue, biopsy, Posterior deep margin left NEGATIVE FOR CARCINOMA 4. Tongue, biopsy, Posterior margin left SQUAMOUS CELL CARCINOMA 5. Tongue, excisional biopsy, New anterior deep margin NEGATIVE FOR CARCINOMA 6. Tongue, excisional biopsy, New posterior deep margin SMALL FOCUS OF SQUAMOUS CELL CARCINOMA (0.1 CM, ONLY PRESENT AT THE DEEPER PERMINENT SECTION) 7. Tongue, resection for tumor, Left lateral INFILTRATIVE KERATINIZED SQUAMOUS CELL CARCINOMA (3.2 CM) THE TUMOR INVADES UNDERNEATH MUSCLE OF THE TONGUE (2.0 CM, PT2) LYMPHOVASCULAR AND PERINEURAL INVASION IDENTIFIED SQUAMOUS CELL CARCINOMA PRESENTED AT (FINAL MARGINS REFER TO PART 2-6) LATERAL INKED MARGIN (1.0 CM) MEDIAL INKED MARGIN (0.6 CM)      02/08/2016 Surgery    SURGICAL PROCEDURES: 1. Left hemiglossectomy with primary closure. 2. Left selective neck dissection (zones I-III).      03/02/2016 Imaging    CT chest with contrast: No evidence of metastatic disease in the chest. 2. Aortic atherosclerosis.       03/02/2016 Imaging    CT neck with contrast showed : Newly enlarged and round 10 mm  right level 1b lymph node (series 5, image 45). The level 1A nodes remain normal. This right 1 B node is nonspecific but suspicious for progressive nodal metastatic disease. It might be amenable to Ultrasound-guided FNA. 2. Interval left hemiglossectomy and selective left neck dissection with confluent indeterminate soft tissue from the anterior left submandibular space through the left carotid space and obscuring the left level 1B, level 2, and level IIIa nodal stations. This study will serve as a new postoperative baseline of this area. Attention directed on close interval follow-up. 3. Mild soft tissue thickening also along the left lateral tongue resection margin. Attention directed on followup. 4. Chest CT today reported separately      03/14/2016 Procedure    She has normal baseline hearing test      03/15/2016 Procedure    1. Successful placement of a right internal jugular approach power injectable Port-A-Cath. The Port a catheter is ready for immediate use. 2. Successful fluoroscopic insertion of a 20-French pull-through gastrostomy tube. The gastrostomy may be used immediately for medication administration and may be utilized in 24 hrs for the initiation of feeds.      03/15/2016 - 04/30/2016 Radiation Therapy    She received concurrent radiation treatment      03/16/2016 - 04/27/2016 Chemotherapy    She received weekly cisplatin       04/04/2016 Adverse Reaction    Chemotherapy is placed on hold due to acute renal failure      04/13/2016 Adverse Reaction    Chemo was placed on hold due to shingles outbreak and paronychia       INTERVAL HISTORY: Please see below for problem oriented charting. She returns for further follow-up She has  completed treatment She still complained of excessive mucositis pain and is not taking much by mouth She denies nausea or vomiting No constipation She is taking nutritional supplement through the feeding tube as instructed  REVIEW OF SYSTEMS:    Constitutional: Denies fevers, chills or abnormal weight loss Eyes: Denies blurriness of vision Respiratory: Denies cough, dyspnea or wheezes Cardiovascular: Denies palpitation, chest discomfort or lower extremity swelling Gastrointestinal:  Denies nausea, heartburn or change in bowel habits Skin: Denies abnormal skin rashes Lymphatics: Denies new lymphadenopathy or easy bruising Neurological:Denies numbness, tingling or new weaknesses Behavioral/Psych: Mood is stable, no new changes  All other systems were reviewed with the patient and are negative.  I have reviewed the past medical history, past surgical history, social history and family history with the patient and they are unchanged from previous note.  ALLERGIES:  has No Known Allergies.  MEDICATIONS:  Current Outpatient Prescriptions  Medication Sig Dispense Refill  . ALPRAZolam (XANAX) 0.5 MG tablet Take 0.5 mg by mouth at bedtime as needed for sleep.     . cephALEXin (KEFLEX) 250 MG/5ML suspension Place 10 mLs (500 mg total) into feeding tube 3 (three) times daily. 300 mL 0  . chlorhexidine (PERIDEX) 0.12 % solution Rinse with 15 mls three times daily for 30 seconds. Use after breakfast, dinner, and at bedtime. Spit out excess. Do not swallow. 480 mL prn  . fentaNYL (DURAGESIC - DOSED MCG/HR) 75 MCG/HR Place 1 patch (75 mcg total) onto the skin every 3 (three) days. 5 patch 0  . gabapentin (NEURONTIN) 300 MG capsule Take 1 capsule (300 mg total) by mouth 2 (two) times daily as needed. 28 capsule 0  . levothyroxine (SYNTHROID, LEVOTHROID) 75 MCG tablet Take 75 mcg by mouth daily before breakfast.     . lidocaine (XYLOCAINE) 2 % solution Patient: Mix 1part 2% viscous lidocaine, 1part H20. Swish and swallow 34mL of this mixture, 32min before meals and at bedtime, up to QID 100 mL 5  . lidocaine (XYLOCAINE) 5 % ointment Apply 1 application topically as needed. Around feeding tube area for severe pain 50 g 0  . lidocaine-prilocaine  (EMLA) cream Apply to affected area once 30 g 3  . LORazepam (ATIVAN) 1 MG tablet Take 1 tablet (1 mg total) by mouth every 8 (eight) hours as needed for anxiety or sleep (nausea). 60 tablet 0  . Morphine Sulfate (MORPHINE CONCENTRATE) 10 mg / 0.5 ml concentrated solution Take 0.5 mLs (10 mg total) by mouth every 2 (two) hours as needed for severe pain. 120 mL 0  . Nutritional Supplements (FEEDING SUPPLEMENT, OSMOLITE 1.5 CAL,) LIQD Begin Osmolite 1.5 via PEG, 1 can QID with 60 mL free water before and after. Increase to goal rate of 2 cans BID and 1.5 cans BID via PEG. Give additional 240 mL free water TID between feedings. Please send formula and supplies. 1659 mL 0  . ondansetron (ZOFRAN ODT) 8 MG disintegrating tablet Take 1 tablet (8 mg total) by mouth every 8 (eight) hours as needed for nausea or vomiting. 30 tablet 5  . prochlorperazine (COMPAZINE) 10 MG tablet Take 1 tablet (10 mg total) by mouth every 6 (six) hours as needed (Nausea or vomiting). 30 tablet 1  . scopolamine (TRANSDERM-SCOP) 1 MG/3DAYS Place 1 patch (1.5 mg total) onto the skin every 3 (three) days. 10 patch 12  . senna (SENOKOT) 8.6 MG tablet Take 1 tablet by mouth 2 (two) times daily.    Marland Kitchen zolpidem (AMBIEN) 10 MG tablet Take  10 mg by mouth at bedtime.     Marland Kitchen atorvastatin (LIPITOR) 10 MG tablet Take 10 mg by mouth daily at 6 PM.     . buPROPion (ZYBAN) 150 MG 12 hr tablet Take 150 mg by mouth daily.     Marland Kitchen HYDROcodone-acetaminophen (HYCET) 7.5-325 mg/15 ml solution Take 10-15 mLs by mouth every 4 (four) hours as needed for moderate pain. (Patient not taking: Reported on 04/26/2016) 473 mL 0  . morphine (ROXANOL) 20 MG/ML concentrated solution Take 1 mL (20 mg total) by mouth every 2 (two) hours as needed for severe pain. 120 mL 0  . ondansetron (ZOFRAN) 8 MG tablet Take 1 tablet (8 mg total) by mouth 2 (two) times daily as needed. Start on the third day after chemotherapy. (Patient not taking: Reported on 05/04/2016) 30 tablet 1  .  triamcinolone (KENALOG) 0.1 % paste APPLY TO AFFECTED AREA     No current facility-administered medications for this visit.    Facility-Administered Medications Ordered in Other Visits  Medication Dose Route Frequency Provider Last Rate Last Dose  . heparin lock flush 100 unit/mL  500 Units Intracatheter Once PRN Alvy Bimler, Sirus Labrie, MD      . sodium chloride flush (NS) 0.9 % injection 10 mL  10 mL Intracatheter PRN Alvy Bimler, Chrishaun Sasso, MD        PHYSICAL EXAMINATION: ECOG PERFORMANCE STATUS: 1 - Symptomatic but completely ambulatory  Vitals:   05/04/16 1056  BP: 130/73  Pulse: (!) 104  Resp: 18  Temp: 99.1 F (37.3 C)   Filed Weights   05/04/16 1056  Weight: 197 lb 6.4 oz (89.5 kg)    GENERAL:alert, no distress and comfortable SKIN: skin color, texture, turgor are normal, no rashes or significant lesions EYES: normal, Conjunctiva are pink and non-injected, sclera clear OROPHARYNX: Noted excessive oral secretions.  No mucositis or thrush NECK: supple, thyroid normal size, non-tender, without nodularity LYMPH:  no palpable lymphadenopathy in the cervical, axillary or inguinal LUNGS: clear to auscultation and percussion with normal breathing effort HEART: regular rate & rhythm and no murmurs and no lower extremity edema ABDOMEN:abdomen soft, non-tender and normal bowel sounds Musculoskeletal:no cyanosis of digits and no clubbing  NEURO: alert & oriented x 3 with fluent speech, no focal motor/sensory deficits  LABORATORY DATA:  I have reviewed the data as listed    Component Value Date/Time   NA 140 05/04/2016 1031   K 3.6 05/04/2016 1031   CL 99 (L) 02/07/2016 0836   CO2 28 05/04/2016 1031   GLUCOSE 96 05/04/2016 1031   BUN 13.1 05/04/2016 1031   CREATININE 1.1 05/04/2016 1031   CALCIUM 9.4 05/04/2016 1031   PROT 7.1 05/04/2016 1031   ALBUMIN 3.5 05/04/2016 1031   AST 31 05/04/2016 1031   ALT 20 05/04/2016 1031   ALKPHOS 101 05/04/2016 1031   BILITOT 0.69 05/04/2016 1031    GFRNONAA 45 (L) 02/08/2016 1859   GFRAA 52 (L) 02/08/2016 1859    No results found for: SPEP, UPEP  Lab Results  Component Value Date   WBC 6.7 05/04/2016   NEUTROABS 5.5 05/04/2016   HGB 11.4 (L) 05/04/2016   HCT 32.6 (L) 05/04/2016   MCV 84.1 05/04/2016   PLT 233 05/04/2016      Chemistry      Component Value Date/Time   NA 140 05/04/2016 1031   K 3.6 05/04/2016 1031   CL 99 (L) 02/07/2016 0836   CO2 28 05/04/2016 1031   BUN 13.1 05/04/2016 1031  CREATININE 1.1 05/04/2016 1031      Component Value Date/Time   CALCIUM 9.4 05/04/2016 1031   ALKPHOS 101 05/04/2016 1031   AST 31 05/04/2016 1031   ALT 20 05/04/2016 1031   BILITOT 0.69 05/04/2016 1031       ASSESSMENT & PLAN:  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 She is still experiencing significant side effects of treatment I will continue to see her on a weekly basis for supportive care  Cancer associated pain She has poorly controlled mucositis pain I plan to increase fentanyl patch to 75 mcg and increased the dose of breakthrough morphine sulfate I will see her back next week in continue to assess pain control  Weight loss She has progressive weight loss I continue to encourage her to try oral intake if tolerated She is reminded that she needs at least 6-7 cans of nutritional supplement through the feeding tube to maintain her weight  Hypomagnesemia She has hypomagnesemia due to cisplatin I will give her IV magnesium while she is getting IV fluids   No orders of the defined types were placed in this encounter.  All questions were answered. The patient knows to call the clinic with any problems, questions or concerns. No barriers to learning was detected. I spent 15 minutes counseling the patient face to face. The total time spent in the appointment was 20 minutes and more than 50% was on counseling and review of test results     [name],  MD 05/05/2016 5:57 AM

## 2016-05-07 ENCOUNTER — Ambulatory Visit: Payer: Self-pay

## 2016-05-07 ENCOUNTER — Ambulatory Visit (HOSPITAL_BASED_OUTPATIENT_CLINIC_OR_DEPARTMENT_OTHER): Payer: 59

## 2016-05-07 ENCOUNTER — Ambulatory Visit: Payer: 59 | Admitting: Nutrition

## 2016-05-07 ENCOUNTER — Other Ambulatory Visit: Payer: Self-pay | Admitting: Hematology and Oncology

## 2016-05-07 ENCOUNTER — Ambulatory Visit: Payer: 59

## 2016-05-07 ENCOUNTER — Ambulatory Visit: Payer: 59 | Attending: Radiation Oncology

## 2016-05-07 VITALS — BP 123/69 | HR 106 | Temp 99.8°F | Resp 19 | Wt 193.6 lb

## 2016-05-07 DIAGNOSIS — R110 Nausea: Secondary | ICD-10-CM | POA: Diagnosis not present

## 2016-05-07 DIAGNOSIS — C023 Malignant neoplasm of anterior two-thirds of tongue, part unspecified: Secondary | ICD-10-CM | POA: Diagnosis not present

## 2016-05-07 MED ORDER — SODIUM CHLORIDE 0.9 % IV SOLN: Freq: Once | INTRAVENOUS | Status: DC

## 2016-05-07 MED ORDER — HEPARIN SOD (PORK) LOCK FLUSH 100 UNIT/ML IV SOLN
500.0000 [IU] | Freq: Once | INTRAVENOUS | Status: AC | PRN
  Administered 2016-05-07: 500 [IU]
  Filled 2016-05-07: qty 5

## 2016-05-07 MED ORDER — ONDANSETRON HCL 4 MG/2ML IJ SOLN
INTRAMUSCULAR | Status: AC
  Filled 2016-05-07: qty 4

## 2016-05-07 MED ORDER — SODIUM CHLORIDE 0.9 % IV SOLN
Freq: Once | INTRAVENOUS | Status: AC
  Administered 2016-05-07: 11:00:00 via INTRAVENOUS

## 2016-05-07 MED ORDER — METOCLOPRAMIDE HCL 10 MG PO TABS: 10.0000 mg | ORAL_TABLET | Freq: Three times a day (TID) | ORAL | 3 refills | Status: DC

## 2016-05-07 MED ORDER — SODIUM CHLORIDE 0.9% FLUSH
10.0000 mL | INTRAVENOUS | Status: DC | PRN
  Administered 2016-05-07: 10 mL
  Filled 2016-05-07: qty 10

## 2016-05-07 MED ORDER — ONDANSETRON HCL 4 MG/2ML IJ SOLN
8.0000 mg | Freq: Once | INTRAMUSCULAR | Status: AC
  Administered 2016-05-07: 8 mg via INTRAVENOUS

## 2016-05-07 MED FILL — ONDANSETRON ODT 8 MG TABLET: 8 | 10 days supply | Qty: 30 | Fill #1

## 2016-05-07 MED FILL — PROCHLORPERAZINE 10 MG TAB: 10 | 7 days supply | Qty: 30 | Fill #0

## 2016-05-07 MED FILL — METOCLOPRAMIDE 10 MG TABLET: 10 | 22 days supply | Qty: 90 | Fill #0

## 2016-05-07 NOTE — Progress Notes (Signed)
Pt unable to stay for speech therapy appt today. Spoke with Glendell Docker, speech therapist and he will speak with [name], RN navigator to re-schedule. Pt aware.

## 2016-05-07 NOTE — Patient Instructions (Signed)
Dehydration, Adult Dehydration is a condition in which there is not enough fluid or water in the body. This happens when you lose more fluids than you take in. Important organs, such as the kidneys, brain, and heart, cannot function without a proper amount of fluids. Any loss of fluids from the body can lead to dehydration. Dehydration can range from mild to severe. This condition should be treated right away to prevent it from becoming severe. What are the causes? This condition may be caused by:  Vomiting.  Diarrhea.  Excessive sweating, such as from heat exposure or exercise.  Not drinking enough fluid, especially:  When ill.  While doing activity that requires a lot of energy.  Excessive urination.  Fever.  Infection.  Certain medicines, such as medicines that cause the body to lose excess fluid (diuretics).  Inability to access safe drinking water.  Reduced physical ability to get adequate water and food. What increases the risk? This condition is more likely to develop in people:  Who have a poorly controlled long-term (chronic) illness, such as diabetes, heart disease, or kidney disease.  Who are age 65 or older.  Who are disabled.  Who live in a place with high altitude.  Who play endurance sports. What are the signs or symptoms? Symptoms of mild dehydration may include:   Thirst.  Dry lips.  Slightly dry mouth.  Dry, warm skin.  Dizziness. Symptoms of moderate dehydration may include:   Very dry mouth.  Muscle cramps.  Dark urine. Urine may be the color of tea.  Decreased urine production.  Decreased tear production.  Heartbeat that is irregular or faster than normal (palpitations).  Headache.  Light-headedness, especially when you stand up from a sitting position.  Fainting (syncope). Symptoms of severe dehydration may include:   Changes in skin, such as:  Cold and clammy skin.  Blotchy (mottled) or pale skin.  Skin that does  not quickly return to normal after being lightly pinched and released (poor skin turgor).  Changes in body fluids, such as:  Extreme thirst.  No tear production.  Inability to sweat when body temperature is high, such as in hot weather.  Very little urine production.  Changes in vital signs, such as:  Weak pulse.  Pulse that is more than 100 beats a minute when sitting still.  Rapid breathing.  Low blood pressure.  Other changes, such as:  Sunken eyes.  Cold hands and feet.  Confusion.  Lack of energy (lethargy).  Difficulty waking up from sleep.  Short-term weight loss.  Unconsciousness. How is this diagnosed? This condition is diagnosed based on your symptoms and a physical exam. Blood and urine tests may be done to help confirm the diagnosis. How is this treated? Treatment for this condition depends on the severity. Mild or moderate dehydration can often be treated at home. Treatment should be started right away. Do not wait until dehydration becomes severe. Severe dehydration is an emergency and it needs to be treated in a hospital. Treatment for mild dehydration may include:   Drinking more fluids.  Replacing salts and minerals in your blood (electrolytes) that you may have lost. Treatment for moderate dehydration may include:   Drinking an oral rehydration solution (ORS). This is a drink that helps you replace fluids and electrolytes (rehydrate). It can be found at pharmacies and retail stores. Treatment for severe dehydration may include:   Receiving fluids through an IV tube.  Receiving an electrolyte solution through a feeding tube that is   passed through your nose and into your stomach (nasogastric tube, or NG tube).  Correcting any abnormalities in electrolytes.  Treating the underlying cause of dehydration. Follow these instructions at home:  If directed by your health care provider, drink an ORS:  Make an ORS by following instructions on the  package.  Start by drinking small amounts, about  cup (120 mL) every 5-10 minutes.  Slowly increase how much you drink until you have taken the amount recommended by your health care provider.  Drink enough clear fluid to keep your urine clear or pale yellow. If you were told to drink an ORS, finish the ORS first, then start slowly drinking other clear fluids. Drink fluids such as:  Water. Do not drink only water. Doing that can lead to having too little salt (sodium) in the body (hyponatremia).  Ice chips.  Fruit juice that you have added water to (diluted fruit juice).  Low-calorie sports drinks.  Avoid:  Alcohol.  Drinks that contain a lot of sugar. These include high-calorie sports drinks, fruit juice that is not diluted, and soda.  Caffeine.  Foods that are greasy or contain a lot of fat or sugar.  Take over-the-counter and prescription medicines only as told by your health care provider.  Do not take sodium tablets. This can lead to having too much sodium in the body (hypernatremia).  Eat foods that contain a healthy balance of electrolytes, such as bananas, oranges, potatoes, tomatoes, and spinach.  Keep all follow-up visits as told by your health care provider. This is important. Contact a health care provider if:  You have abdominal pain that:  Gets worse.  Stays in one area (localizes).  You have a rash.  You have a stiff neck.  You are more irritable than usual.  You are sleepier or more difficult to wake up than usual.  You feel weak or dizzy.  You feel very thirsty.  You have urinated only a small amount of very dark urine over 6-8 hours. Get help right away if:  You have symptoms of severe dehydration.  You cannot drink fluids without vomiting.  Your symptoms get worse with treatment.  You have a fever.  You have a severe headache.  You have vomiting or diarrhea that:  Gets worse.  Does not go away.  You have blood or green matter  (bile) in your vomit.  You have blood in your stool. This may cause stool to look black and tarry.  You have not urinated in 6-8 hours.  You faint.  Your heart rate while sitting still is over 100 beats a minute.  You have trouble breathing. This information is not intended to replace advice given to you by your health care provider. Make sure you discuss any questions you have with your health care provider. Document Released: 12/18/2004 Document Revised: 07/15/2015 Document Reviewed: 02/11/2015 Elsevier Interactive Patient Education  2017 [employer]  

## 2016-05-07 NOTE — Progress Notes (Signed)
Nutrition follow-up completed with patient during IV fluids. Patient still is not tolerating more than 3 bottles of Ensure Plus daily. She continues to refuse continuous tube feeding. Reports ongoing nausea which is not improved with medication. She is afraid to do too much tube feeding for fear of vomiting.  Nutrition diagnosis: Unintended weight loss and severe malnutrition.  Continue.  Intervention: Patient continues to refuse continuous tube feeding, although I have explained how it could be helpful for her. Recommend a trial of Reglan for patient to see if this improves nausea and gastric motility.  Discussed with M.D. Teach back method used.  Monitoring, evaluation, goals:  Patient will work to increase tube feeding/oral intake to minimize further weight loss.  Next visit: Tuesday, May 15, during infusion.  **Disclaimer: This note was dictated with voice recognition software. Similar sounding words can inadvertently be transcribed and this note may contain transcription errors which may not have been corrected upon publication of note.**

## 2016-05-08 ENCOUNTER — Ambulatory Visit (HOSPITAL_BASED_OUTPATIENT_CLINIC_OR_DEPARTMENT_OTHER): Payer: 59

## 2016-05-08 ENCOUNTER — Ambulatory Visit: Payer: 59

## 2016-05-08 VITALS — BP 139/80 | HR 100 | Temp 99.6°F | Resp 19 | Ht 67.0 in | Wt 193.9 lb

## 2016-05-08 DIAGNOSIS — C023 Malignant neoplasm of anterior two-thirds of tongue, part unspecified: Secondary | ICD-10-CM

## 2016-05-08 DIAGNOSIS — R110 Nausea: Secondary | ICD-10-CM | POA: Diagnosis not present

## 2016-05-08 MED ORDER — SODIUM CHLORIDE 0.9 % IV SOLN
Freq: Once | INTRAVENOUS | Status: AC
  Administered 2016-05-08: 12:00:00 via INTRAVENOUS

## 2016-05-08 MED ORDER — ONDANSETRON HCL 4 MG/2ML IJ SOLN
8.0000 mg | Freq: Once | INTRAMUSCULAR | Status: AC
  Administered 2016-05-08: 8 mg via INTRAVENOUS

## 2016-05-08 MED ORDER — ONDANSETRON HCL 4 MG/2ML IJ SOLN
INTRAMUSCULAR | Status: AC
  Filled 2016-05-08: qty 4

## 2016-05-08 MED ORDER — SODIUM CHLORIDE 0.9% FLUSH
10.0000 mL | INTRAVENOUS | Status: DC | PRN
  Administered 2016-05-08: 10 mL
  Filled 2016-05-08: qty 10

## 2016-05-08 MED ORDER — HEPARIN SOD (PORK) LOCK FLUSH 100 UNIT/ML IV SOLN
500.0000 [IU] | Freq: Once | INTRAVENOUS | Status: AC | PRN
  Administered 2016-05-08: 500 [IU]
  Filled 2016-05-08: qty 5

## 2016-05-08 MED ORDER — SODIUM CHLORIDE 0.9 % IV SOLN: Freq: Once | INTRAVENOUS | Status: DC

## 2016-05-08 NOTE — Progress Notes (Signed)
Reviewed and discussed pt's current nutritional intake.  Ernestene Kiel , dietician has talked to her about continuous feedings at night as pt continues to lose weight.  After some conversation with her about, pt is now agreeable to trying this.  Notified Ernestene Kiel and she will follow up with pt tomorrow when the pt comes in for fluids.

## 2016-05-08 NOTE — Patient Instructions (Signed)
Dehydration, Adult Dehydration is a condition in which there is not enough fluid or water in the body. This happens when you lose more fluids than you take in. Important organs, such as the kidneys, brain, and heart, cannot function without a proper amount of fluids. Any loss of fluids from the body can lead to dehydration. Dehydration can range from mild to severe. This condition should be treated right away to prevent it from becoming severe. What are the causes? This condition may be caused by:  Vomiting.  Diarrhea.  Excessive sweating, such as from heat exposure or exercise.  Not drinking enough fluid, especially:  When ill.  While doing activity that requires a lot of energy.  Excessive urination.  Fever.  Infection.  Certain medicines, such as medicines that cause the body to lose excess fluid (diuretics).  Inability to access safe drinking water.  Reduced physical ability to get adequate water and food. What increases the risk? This condition is more likely to develop in people:  Who have a poorly controlled long-term (chronic) illness, such as diabetes, heart disease, or kidney disease.  Who are age 65 or older.  Who are disabled.  Who live in a place with high altitude.  Who play endurance sports. What are the signs or symptoms? Symptoms of mild dehydration may include:   Thirst.  Dry lips.  Slightly dry mouth.  Dry, warm skin.  Dizziness. Symptoms of moderate dehydration may include:   Very dry mouth.  Muscle cramps.  Dark urine. Urine may be the color of tea.  Decreased urine production.  Decreased tear production.  Heartbeat that is irregular or faster than normal (palpitations).  Headache.  Light-headedness, especially when you stand up from a sitting position.  Fainting (syncope). Symptoms of severe dehydration may include:   Changes in skin, such as:  Cold and clammy skin.  Blotchy (mottled) or pale skin.  Skin that does  not quickly return to normal after being lightly pinched and released (poor skin turgor).  Changes in body fluids, such as:  Extreme thirst.  No tear production.  Inability to sweat when body temperature is high, such as in hot weather.  Very little urine production.  Changes in vital signs, such as:  Weak pulse.  Pulse that is more than 100 beats a minute when sitting still.  Rapid breathing.  Low blood pressure.  Other changes, such as:  Sunken eyes.  Cold hands and feet.  Confusion.  Lack of energy (lethargy).  Difficulty waking up from sleep.  Short-term weight loss.  Unconsciousness. How is this diagnosed? This condition is diagnosed based on your symptoms and a physical exam. Blood and urine tests may be done to help confirm the diagnosis. How is this treated? Treatment for this condition depends on the severity. Mild or moderate dehydration can often be treated at home. Treatment should be started right away. Do not wait until dehydration becomes severe. Severe dehydration is an emergency and it needs to be treated in a hospital. Treatment for mild dehydration may include:   Drinking more fluids.  Replacing salts and minerals in your blood (electrolytes) that you may have lost. Treatment for moderate dehydration may include:   Drinking an oral rehydration solution (ORS). This is a drink that helps you replace fluids and electrolytes (rehydrate). It can be found at pharmacies and retail stores. Treatment for severe dehydration may include:   Receiving fluids through an IV tube.  Receiving an electrolyte solution through a feeding tube that is   passed through your nose and into your stomach (nasogastric tube, or NG tube).  Correcting any abnormalities in electrolytes.  Treating the underlying cause of dehydration. Follow these instructions at home:  If directed by your health care provider, drink an ORS:  Make an ORS by following instructions on the  package.  Start by drinking small amounts, about  cup (120 mL) every 5-10 minutes.  Slowly increase how much you drink until you have taken the amount recommended by your health care provider.  Drink enough clear fluid to keep your urine clear or pale yellow. If you were told to drink an ORS, finish the ORS first, then start slowly drinking other clear fluids. Drink fluids such as:  Water. Do not drink only water. Doing that can lead to having too little salt (sodium) in the body (hyponatremia).  Ice chips.  Fruit juice that you have added water to (diluted fruit juice).  Low-calorie sports drinks.  Avoid:  Alcohol.  Drinks that contain a lot of sugar. These include high-calorie sports drinks, fruit juice that is not diluted, and soda.  Caffeine.  Foods that are greasy or contain a lot of fat or sugar.  Take over-the-counter and prescription medicines only as told by your health care provider.  Do not take sodium tablets. This can lead to having too much sodium in the body (hypernatremia).  Eat foods that contain a healthy balance of electrolytes, such as bananas, oranges, potatoes, tomatoes, and spinach.  Keep all follow-up visits as told by your health care provider. This is important. Contact a health care provider if:  You have abdominal pain that:  Gets worse.  Stays in one area (localizes).  You have a rash.  You have a stiff neck.  You are more irritable than usual.  You are sleepier or more difficult to wake up than usual.  You feel weak or dizzy.  You feel very thirsty.  You have urinated only a small amount of very dark urine over 6-8 hours. Get help right away if:  You have symptoms of severe dehydration.  You cannot drink fluids without vomiting.  Your symptoms get worse with treatment.  You have a fever.  You have a severe headache.  You have vomiting or diarrhea that:  Gets worse.  Does not go away.  You have blood or green matter  (bile) in your vomit.  You have blood in your stool. This may cause stool to look black and tarry.  You have not urinated in 6-8 hours.  You faint.  Your heart rate while sitting still is over 100 beats a minute.  You have trouble breathing. This information is not intended to replace advice given to you by your health care provider. Make sure you discuss any questions you have with your health care provider. Document Released: 12/18/2004 Document Revised: 07/15/2015 Document Reviewed: 02/11/2015 Elsevier Interactive Patient Education  2017 [employer]  

## 2016-05-09 ENCOUNTER — Ambulatory Visit: Payer: 59

## 2016-05-09 ENCOUNTER — Ambulatory Visit: Payer: Self-pay

## 2016-05-09 ENCOUNTER — Telehealth: Payer: Self-pay | Admitting: Hematology and Oncology

## 2016-05-09 ENCOUNTER — Encounter: Payer: Self-pay | Admitting: Nutrition

## 2016-05-09 DIAGNOSIS — C023 Malignant neoplasm of anterior two-thirds of tongue, part unspecified: Secondary | ICD-10-CM

## 2016-05-09 MED ORDER — OSMOLITE 1.5 CAL PO LIQD: ORAL | 0 refills | Status: DC

## 2016-05-09 NOTE — Telephone Encounter (Signed)
Pt called to cxl today's appts. Pt confirmed she will be at 5/10 appts

## 2016-05-09 NOTE — Progress Notes (Signed)
Patient is willing to begin continuous tube feeding per nursing secondary to intolerance of bolus feeding.  Continued weight loss. Will begin Osmolite 1.5 at 60 mL an hour via PEG increase 10 mL every 4 hours to goal rate of 120 mL an hour for 14 hours daily from 6 PM until 8 AM.  Patient will flush feeding tube with 120 mL free water before and after continuous feedings.  In addition patient will flush feeding tube with 240 cc free water 4 times a day. Orders were written.  [facility] notified. Patient will be instructed on continuous feedings.

## 2016-05-10 ENCOUNTER — Ambulatory Visit (HOSPITAL_BASED_OUTPATIENT_CLINIC_OR_DEPARTMENT_OTHER): Payer: 59

## 2016-05-10 VITALS — BP 123/59 | HR 18 | Temp 99.1°F | Resp 18 | Ht 67.0 in | Wt 193.3 lb

## 2016-05-10 DIAGNOSIS — C023 Malignant neoplasm of anterior two-thirds of tongue, part unspecified: Secondary | ICD-10-CM

## 2016-05-10 MED ORDER — SODIUM CHLORIDE 0.9 % IV SOLN
Freq: Once | INTRAVENOUS | Status: AC
  Administered 2016-05-10: 12:00:00 via INTRAVENOUS

## 2016-05-10 MED ORDER — HEPARIN SOD (PORK) LOCK FLUSH 100 UNIT/ML IV SOLN
500.0000 [IU] | Freq: Once | INTRAVENOUS | Status: AC | PRN
  Administered 2016-05-10: 500 [IU]
  Filled 2016-05-10: qty 5

## 2016-05-10 MED ORDER — ONDANSETRON HCL 4 MG/2ML IJ SOLN
8.0000 mg | Freq: Once | INTRAMUSCULAR | Status: AC
  Administered 2016-05-10: 8 mg via INTRAVENOUS

## 2016-05-10 MED ORDER — SODIUM CHLORIDE 0.9% FLUSH
10.0000 mL | INTRAVENOUS | Status: DC | PRN
  Administered 2016-05-10: 10 mL
  Filled 2016-05-10: qty 10

## 2016-05-10 MED ORDER — ONDANSETRON HCL 4 MG/2ML IJ SOLN
INTRAMUSCULAR | Status: AC
  Filled 2016-05-10: qty 4

## 2016-05-10 NOTE — Progress Notes (Signed)
Pt states she is beginning to feel better. Saliva has decreased. Continues to use Scopalamine patch. Speech is clearer.  She did not come yesterday d/t working diligently to straighten out health insurance issues. They seem to be resolved at this time.  She seems to be in better spirits.  She is to see Dr. Alvy Bimler and Ernestene Kiel tomorrow during her infusion time.

## 2016-05-10 NOTE — Patient Instructions (Signed)
Dehydration, Adult Dehydration is a condition in which there is not enough fluid or water in the body. This happens when you lose more fluids than you take in. Important organs, such as the kidneys, brain, and heart, cannot function without a proper amount of fluids. Any loss of fluids from the body can lead to dehydration. Dehydration can range from mild to severe. This condition should be treated right away to prevent it from becoming severe. What are the causes? This condition may be caused by:  Vomiting.  Diarrhea.  Excessive sweating, such as from heat exposure or exercise.  Not drinking enough fluid, especially:  When ill.  While doing activity that requires a lot of energy.  Excessive urination.  Fever.  Infection.  Certain medicines, such as medicines that cause the body to lose excess fluid (diuretics).  Inability to access safe drinking water.  Reduced physical ability to get adequate water and food. What increases the risk? This condition is more likely to develop in people:  Who have a poorly controlled long-term (chronic) illness, such as diabetes, heart disease, or kidney disease.  Who are age 65 or older.  Who are disabled.  Who live in a place with high altitude.  Who play endurance sports. What are the signs or symptoms? Symptoms of mild dehydration may include:   Thirst.  Dry lips.  Slightly dry mouth.  Dry, warm skin.  Dizziness. Symptoms of moderate dehydration may include:   Very dry mouth.  Muscle cramps.  Dark urine. Urine may be the color of tea.  Decreased urine production.  Decreased tear production.  Heartbeat that is irregular or faster than normal (palpitations).  Headache.  Light-headedness, especially when you stand up from a sitting position.  Fainting (syncope). Symptoms of severe dehydration may include:   Changes in skin, such as:  Cold and clammy skin.  Blotchy (mottled) or pale skin.  Skin that does  not quickly return to normal after being lightly pinched and released (poor skin turgor).  Changes in body fluids, such as:  Extreme thirst.  No tear production.  Inability to sweat when body temperature is high, such as in hot weather.  Very little urine production.  Changes in vital signs, such as:  Weak pulse.  Pulse that is more than 100 beats a minute when sitting still.  Rapid breathing.  Low blood pressure.  Other changes, such as:  Sunken eyes.  Cold hands and feet.  Confusion.  Lack of energy (lethargy).  Difficulty waking up from sleep.  Short-term weight loss.  Unconsciousness. How is this diagnosed? This condition is diagnosed based on your symptoms and a physical exam. Blood and urine tests may be done to help confirm the diagnosis. How is this treated? Treatment for this condition depends on the severity. Mild or moderate dehydration can often be treated at home. Treatment should be started right away. Do not wait until dehydration becomes severe. Severe dehydration is an emergency and it needs to be treated in a hospital. Treatment for mild dehydration may include:   Drinking more fluids.  Replacing salts and minerals in your blood (electrolytes) that you may have lost. Treatment for moderate dehydration may include:   Drinking an oral rehydration solution (ORS). This is a drink that helps you replace fluids and electrolytes (rehydrate). It can be found at pharmacies and retail stores. Treatment for severe dehydration may include:   Receiving fluids through an IV tube.  Receiving an electrolyte solution through a feeding tube that is   passed through your nose and into your stomach (nasogastric tube, or NG tube).  Correcting any abnormalities in electrolytes.  Treating the underlying cause of dehydration. Follow these instructions at home:  If directed by your health care provider, drink an ORS:  Make an ORS by following instructions on the  package.  Start by drinking small amounts, about  cup (120 mL) every 5-10 minutes.  Slowly increase how much you drink until you have taken the amount recommended by your health care provider.  Drink enough clear fluid to keep your urine clear or pale yellow. If you were told to drink an ORS, finish the ORS first, then start slowly drinking other clear fluids. Drink fluids such as:  Water. Do not drink only water. Doing that can lead to having too little salt (sodium) in the body (hyponatremia).  Ice chips.  Fruit juice that you have added water to (diluted fruit juice).  Low-calorie sports drinks.  Avoid:  Alcohol.  Drinks that contain a lot of sugar. These include high-calorie sports drinks, fruit juice that is not diluted, and soda.  Caffeine.  Foods that are greasy or contain a lot of fat or sugar.  Take over-the-counter and prescription medicines only as told by your health care provider.  Do not take sodium tablets. This can lead to having too much sodium in the body (hypernatremia).  Eat foods that contain a healthy balance of electrolytes, such as bananas, oranges, potatoes, tomatoes, and spinach.  Keep all follow-up visits as told by your health care provider. This is important. Contact a health care provider if:  You have abdominal pain that:  Gets worse.  Stays in one area (localizes).  You have a rash.  You have a stiff neck.  You are more irritable than usual.  You are sleepier or more difficult to wake up than usual.  You feel weak or dizzy.  You feel very thirsty.  You have urinated only a small amount of very dark urine over 6-8 hours. Get help right away if:  You have symptoms of severe dehydration.  You cannot drink fluids without vomiting.  Your symptoms get worse with treatment.  You have a fever.  You have a severe headache.  You have vomiting or diarrhea that:  Gets worse.  Does not go away.  You have blood or green matter  (bile) in your vomit.  You have blood in your stool. This may cause stool to look black and tarry.  You have not urinated in 6-8 hours.  You faint.  Your heart rate while sitting still is over 100 beats a minute.  You have trouble breathing. This information is not intended to replace advice given to you by your health care provider. Make sure you discuss any questions you have with your health care provider. Document Released: 12/18/2004 Document Revised: 07/15/2015 Document Reviewed: 02/11/2015 Elsevier Interactive Patient Education  2017 [employer]  

## 2016-05-11 ENCOUNTER — Ambulatory Visit: Payer: Self-pay | Admitting: Nutrition

## 2016-05-11 ENCOUNTER — Ambulatory Visit (HOSPITAL_BASED_OUTPATIENT_CLINIC_OR_DEPARTMENT_OTHER): Payer: 59 | Admitting: Hematology and Oncology

## 2016-05-11 ENCOUNTER — Ambulatory Visit (HOSPITAL_BASED_OUTPATIENT_CLINIC_OR_DEPARTMENT_OTHER): Payer: 59

## 2016-05-11 ENCOUNTER — Encounter: Payer: Self-pay | Admitting: Hematology and Oncology

## 2016-05-11 ENCOUNTER — Encounter: Payer: Self-pay | Admitting: Radiation Oncology

## 2016-05-11 VITALS — BP 126/69 | HR 84 | Temp 99.0°F | Resp 18 | Ht 67.0 in | Wt 193.8 lb

## 2016-05-11 DIAGNOSIS — I890 Lymphedema, not elsewhere classified: Secondary | ICD-10-CM | POA: Diagnosis not present

## 2016-05-11 DIAGNOSIS — K117 Disturbances of salivary secretion: Secondary | ICD-10-CM | POA: Diagnosis not present

## 2016-05-11 DIAGNOSIS — C023 Malignant neoplasm of anterior two-thirds of tongue, part unspecified: Secondary | ICD-10-CM

## 2016-05-11 DIAGNOSIS — G893 Neoplasm related pain (acute) (chronic): Secondary | ICD-10-CM

## 2016-05-11 DIAGNOSIS — R634 Abnormal weight loss: Secondary | ICD-10-CM

## 2016-05-11 MED ORDER — HEPARIN SOD (PORK) LOCK FLUSH 100 UNIT/ML IV SOLN
500.0000 [IU] | Freq: Once | INTRAVENOUS | Status: AC | PRN
  Administered 2016-05-11: 500 [IU]
  Filled 2016-05-11: qty 5

## 2016-05-11 MED ORDER — ONDANSETRON HCL 4 MG/2ML IJ SOLN
8.0000 mg | Freq: Once | INTRAMUSCULAR | Status: AC
  Administered 2016-05-11: 8 mg via INTRAVENOUS

## 2016-05-11 MED ORDER — SODIUM CHLORIDE 0.9% FLUSH
10.0000 mL | INTRAVENOUS | Status: DC | PRN
  Administered 2016-05-11: 10 mL
  Filled 2016-05-11: qty 10

## 2016-05-11 MED ORDER — SODIUM CHLORIDE 0.9 % IV SOLN
Freq: Once | INTRAVENOUS | Status: AC
  Administered 2016-05-11: 11:00:00 via INTRAVENOUS

## 2016-05-11 MED ORDER — ONDANSETRON HCL 4 MG/2ML IJ SOLN
INTRAMUSCULAR | Status: AC
  Filled 2016-05-11: qty 4

## 2016-05-11 NOTE — Patient Instructions (Signed)
Stomatitis Stomatitis is a condition that causes swelling (inflammation) in your mouth. It can affect a part of your mouth or your whole mouth. The condition often affects your cheek, teeth, gums, lips, and tongue. Stomatitis can also affect the mucous membranes that surround your mouth (mucosa). Pain from stomatitis can make it hard for you to eat or drink. Very bad cases of this condition can lead to not getting enough fluid in your body (dehydration) or poor nutrition. Follow these instructions at home: Medicines   Take medicines only as told by your doctor.  If you were prescribed an antibiotic, finish all of it even if you start to feel better. Lifestyle   Take good care of your mouth and teeth (oral hygiene):  Gently brush your teeth with a soft, nylon-bristled toothbrush two times each day.  Floss your teeth every day.  Have your teeth cleaned regularly. Do this as told by your dentist.  Eat a balanced diet. Do not eat:  Spicy foods.  Citrus, such as oranges.  Foods that have sharp edges, such as chips.  Avoid any foods or other things that you think may be causing this condition.  If you have dentures, make sure that they fit the way that they should.  Do not use any tobacco products, including cigarettes, chewing tobacco, or electronic cigarettes. If you need help quitting, ask your doctor.  Find ways to lower your stress. Try yoga or meditation. Ask your doctor for other ideas. General instructions   Use a salt-water rinse for pain as told by your doctor. Mix 1 tsp of salt in 2 cups of water.  Drink enough fluid to keep your pee (urine) clear or pale yellow. This will keep you hydrated. Contact a doctor if:  Your symptoms get worse.  You develop new symptoms, especially:  A rash.  New symptoms that do not involve your mouth area.  Your symptoms last longer than three weeks.  Your stomatitis goes away and then comes back.  You have a harder time eating  and drinking normally.  You are more tired.  You feel weaker.  You stop feeling hungry.  You feel sick to your stomach (nauseous).  You have a fever. This information is not intended to replace advice given to you by your health care provider. Make sure you discuss any questions you have with your health care provider. Document Released: 12/07/2010 Document Revised: 08/17/2015 Document Reviewed: 12/14/2013 Elsevier Interactive Patient Education  2017 [employer] Dehydration, Adult Dehydration is when there is not enough fluid or water in your body. This happens when you lose more fluids than you take in. Dehydration can range from mild to very bad. It should be treated right away to keep it from getting very bad. Symptoms of mild dehydration may include:   Thirst.  Dry lips.  Slightly dry mouth.  Dry, warm skin.  Dizziness. Symptoms of moderate dehydration may include:   Very dry mouth.  Muscle cramps.  Dark pee (urine). Pee may be the color of tea.  Your body making less pee.  Your eyes making fewer tears.  Heartbeat that is uneven or faster than normal (palpitations).  Headache.  Light-headedness, especially when you stand up from sitting.  Fainting (syncope). Symptoms of very bad dehydration may include:   Changes in skin, such as:  Cold and clammy skin.  Blotchy (mottled) or pale skin.  Skin that does not quickly return to normal after being lightly pinched and let go (poor skin turgor).  Changes in body fluids, such as:  Feeling very thirsty.  Your eyes making fewer tears.  Not sweating when body temperature is high, such as in hot weather.  Your body making very little pee.  Changes in vital signs, such as:  Weak pulse.  Pulse that is more than 100 beats a minute when you are sitting still.  Fast breathing.  Low blood pressure.  Other changes, such as:  Sunken eyes.  Cold hands and feet.  Confusion.  Lack of energy  (lethargy).  Trouble waking up from sleep.  Short-term weight loss.  Unconsciousness. Follow these instructions at home:  If told by your doctor, drink an ORS:  Make an ORS by using instructions on the package.  Start by drinking small amounts, about  cup (120 mL) every 5-10 minutes.  Slowly drink more until you have had the amount that your doctor said to have.  Drink enough clear fluid to keep your pee clear or pale yellow. If you were told to drink an ORS, finish the ORS first, then start slowly drinking clear fluids. Drink fluids such as:  Water. Do not drink only water by itself. Doing that can make the salt (sodium) level in your body get too low (hyponatremia).  Ice chips.  Fruit juice that you have added water to (diluted).  Low-calorie sports drinks.  Avoid:  Alcohol.  Drinks that have a lot of sugar. These include high-calorie sports drinks, fruit juice that does not have water added, and soda.  Caffeine.  Foods that are greasy or have a lot of fat or sugar.  Take over-the-counter and prescription medicines only as told by your doctor.  Do not take salt tablets. Doing that can make the salt level in your body get too high (hypernatremia).  Eat foods that have minerals (electrolytes). Examples include bananas, oranges, potatoes, tomatoes, and spinach.  Keep all follow-up visits as told by your doctor. This is important. Contact a doctor if:  You have belly (abdominal) pain that:  Gets worse.  Stays in one area (localizes).  You have a rash.  You have a stiff neck.  You get angry or annoyed more easily than normal (irritability).  You are more sleepy than normal.  You have a harder time waking up than normal.  You feel:  Weak.  Dizzy.  Very thirsty.  You have peed (urinated) only a small amount of very dark pee during 6-8 hours. Get help right away if:  You have symptoms of very bad dehydration.  You cannot drink fluids without  throwing up (vomiting).  Your symptoms get worse with treatment.  You have a fever.  You have a very bad headache.  You are throwing up or having watery poop (diarrhea) and it:  Gets worse.  Does not go away.  You have blood or something green (bile) in your throw-up.  You have blood in your poop (stool). This may cause poop to look black and tarry.  You have not peed in 6-8 hours.  You pass out (faint).  Your heart rate when you are sitting still is more than 100 beats a minute.  You have trouble breathing. This information is not intended to replace advice given to you by your health care provider. Make sure you discuss any questions you have with your health care provider. Document Released: 10/14/2008 Document Revised: 07/08/2015 Document Reviewed: 02/11/2015 Elsevier Interactive Patient Education  2017 [employer].

## 2016-05-11 NOTE — Assessment & Plan Note (Signed)
She has completed recent chemoradiation treatment. Most of the side effects of treatment are resolving She will continue aggressive supportive care I will space out her return visit appointment and see her back at the end of the month. She has appointment to see radiation oncology next week.

## 2016-05-11 NOTE — Progress Notes (Signed)
  Ms. Withers presents for follow up of radiation completed 04/30/16 to her Tongue and bilateral neck.  Pain issues, if any: She has throat pain which is present all the time and when swallowing. She is using a fentanyl patch, and oral morphine occasionally.  Using a feeding tube?: Yes, she is receiving continuous feeds at night, equaling 4 cans daily.  Weight changes, if any:  Wt Readings from Last 3 Encounters:  05/18/16 195 lb 3.2 oz (88.5 kg)  05/18/16 193 lb 9.6 oz (87.8 kg)  05/16/16 195 lb 1.6 oz (88.5 kg)   Swallowing issues, if any: She is swallowing water, ice cream, and "wet cookies" Smoking or chewing tobacco? No Using fluoride trays daily? No Last ENT visit was on: no Other notable issues, if any:  Dr. Alvy Bimler 05/11/16 She is receiving Monday through Friday IVF scheduled through Friday 06/01/16.  Her skin is healing. She has hyperpigmentation present to her bilateral neck. She is using biafine to this area, and will switch to a vitamin E cream when completed.   BP 134/76   Pulse 89   Temp 99.3 F (37.4 C)   Ht 5\' 7"  (1.702 m)   Wt 195 lb 3.2 oz (88.5 kg)   SpO2 100% Comment: room air  BMI 30.57 kg/m

## 2016-05-11 NOTE — Assessment & Plan Note (Signed)
This is much improved.  I recommend consideration of stopping scopolamine patch next week

## 2016-05-11 NOTE — Progress Notes (Signed)
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 Tongue, biopsy, Anterior deep margin left SQUAMOUS CELL CARCINOMA 3. Tongue, biopsy, Posterior deep margin left NEGATIVE FOR CARCINOMA 4. Tongue, biopsy, Posterior margin left SQUAMOUS CELL CARCINOMA 5. Tongue, excisional biopsy, New anterior deep margin NEGATIVE FOR CARCINOMA 6. Tongue, excisional biopsy, New posterior deep margin SMALL FOCUS OF SQUAMOUS CELL CARCINOMA (0.1 CM, ONLY PRESENT AT THE DEEPER PERMINENT SECTION) 7. Tongue, resection for tumor, Left lateral INFILTRATIVE KERATINIZED SQUAMOUS CELL CARCINOMA (3.2 CM) THE TUMOR INVADES UNDERNEATH MUSCLE OF THE TONGUE (2.0 CM, PT2) LYMPHOVASCULAR AND PERINEURAL INVASION IDENTIFIED SQUAMOUS CELL CARCINOMA PRESENTED AT (FINAL MARGINS REFER TO PART 2-6) LATERAL INKED MARGIN (1.0 CM) MEDIAL INKED MARGIN (0.6 CM)      02/08/2016 Surgery    SURGICAL PROCEDURES: 1. Left hemiglossectomy with primary closure. 2. Left selective neck dissection (zones I-III).      03/02/2016 Imaging    CT chest with contrast: No evidence of metastatic disease in the chest. 2. Aortic atherosclerosis.       03/02/2016 Imaging    CT neck with contrast showed : Newly enlarged and round 10 mm  right level 1b lymph node (series 5, image 45). The level 1A nodes remain normal. This right 1 B node is nonspecific but suspicious for progressive nodal metastatic disease. It might be amenable to Ultrasound-guided FNA. 2. Interval left hemiglossectomy and selective left neck dissection with confluent indeterminate soft tissue from the anterior left submandibular space through the left carotid space and obscuring the left level 1B, level 2, and level IIIa nodal stations. This study will serve as a new postoperative baseline of this area. Attention directed on close interval follow-up. 3. Mild soft tissue thickening also along the left lateral tongue resection margin. Attention directed on followup. 4. Chest CT today reported separately      03/14/2016 Procedure    She has normal baseline hearing test      03/15/2016 Procedure    1. Successful placement of a right internal jugular approach power injectable Port-A-Cath. The Port a catheter is ready for immediate use. 2. Successful fluoroscopic insertion of a 20-French pull-through gastrostomy tube. The gastrostomy may be used immediately for medication administration and may be utilized in 24 hrs for the initiation of feeds.      03/15/2016 - 04/30/2016 Radiation Therapy    She received concurrent radiation treatment      03/16/2016 - 04/27/2016 Chemotherapy    She received weekly cisplatin       04/04/2016 Adverse Reaction    Chemotherapy is placed on hold due to acute renal failure      04/13/2016 Adverse Reaction    Chemo was placed on hold due to shingles outbreak and paronychia       INTERVAL HISTORY: Please see below for problem oriented charting. She returns for further follow-up.  She  is seen in the infusion room. Overall, she is feeling better.  Her voice is stronger.  She is able to tolerate some oral intake with ice chips and water. Pain control is excellent. She is concerned about swelling around her neck, most consistent with  lymphedema. She is able to keep her weight stable and tolerate nutritional supplement as directed by dietitian. Denies nausea vomiting.  Oral secretion has improved.  REVIEW OF SYSTEMS:   Constitutional: Denies fevers, chills or abnormal weight loss Eyes: Denies blurriness of vision Ears, nose, mouth, throat, and face: Denies mucositis or sore throat Respiratory: Denies cough, dyspnea or wheezes Cardiovascular: Denies palpitation, chest discomfort or lower extremity swelling Gastrointestinal:  Denies nausea, heartburn or change in bowel habits Skin: Denies abnormal skin rashes Lymphatics: Denies new lymphadenopathy or easy bruising Neurological:Denies numbness, tingling or new weaknesses Behavioral/Psych: Mood is stable, no new changes  All other systems were reviewed with the patient and are negative.  I have reviewed the past medical history, past surgical history, social history and family history with the patient and they are unchanged from previous note.  ALLERGIES:  has No Known Allergies.  MEDICATIONS:  Current Outpatient Prescriptions  Medication Sig Dispense Refill  . ALPRAZolam (XANAX) 0.5 MG tablet Take 0.5 mg by mouth at bedtime as needed for sleep.     Marland Kitchen atorvastatin (LIPITOR) 10 MG tablet Take 10 mg by mouth daily at 6 PM.     . buPROPion (ZYBAN) 150 MG 12 hr tablet Take 150 mg by mouth daily.     . cephALEXin (KEFLEX) 250 MG/5ML suspension Place 10 mLs (500 mg total) into feeding tube 3 (three) times daily. 300 mL 0  . chlorhexidine (PERIDEX) 0.12 % solution Rinse with 15 mls three times daily for 30 seconds. Use after breakfast, dinner, and at bedtime. Spit out excess. Do not swallow. 480 mL prn  . fentaNYL (DURAGESIC - DOSED MCG/HR) 75 MCG/HR Place 1 patch (75 mcg total) onto the skin every 3 (three) days. 5 patch 0  . gabapentin (NEURONTIN) 300 MG capsule Take 1 capsule (300 mg total) by mouth 2 (two) times daily as needed. 28 capsule 0  . HYDROcodone-acetaminophen  (HYCET) 7.5-325 mg/15 ml solution Take 10-15 mLs by mouth every 4 (four) hours as needed for moderate pain. (Patient not taking: Reported on 04/26/2016) 473 mL 0  . levothyroxine (SYNTHROID, LEVOTHROID) 75 MCG tablet Take 75 mcg by mouth daily before breakfast.     . lidocaine (XYLOCAINE) 2 % solution Patient: Mix 1part 2% viscous lidocaine, 1part H20. Swish and swallow 27mL of this mixture, 66min before meals and at bedtime, up to QID 100 mL 5  . lidocaine (XYLOCAINE) 5 % ointment Apply 1 application topically as needed. Around feeding tube area for severe pain 50 g 0  . lidocaine-prilocaine (EMLA) cream Apply to affected area once 30 g 3  . LORazepam (ATIVAN) 1 MG tablet Take 1 tablet (1 mg total) by mouth every 8 (eight) hours as needed for anxiety or sleep (nausea). 60 tablet 0  . metoCLOPramide (REGLAN) 10 MG tablet Take 1 tablet (10 mg total) by mouth 4 (four) times daily -  before meals and at bedtime. 90 tablet 3  . morphine (ROXANOL) 20 MG/ML concentrated solution Take 1 mL (20 mg total) by mouth every 2 (two) hours as needed for severe pain. 120 mL 0  . Morphine Sulfate (MORPHINE CONCENTRATE) 10 mg / 0.5 ml concentrated solution Take 0.5 mLs (10 mg total) by mouth every 2 (  two) hours as needed for severe pain. 120 mL 0  . Nutritional Supplements (FEEDING SUPPLEMENT, OSMOLITE 1.5 CAL,) LIQD Begin Osmolite 1.5 at 60 mL/hr via PEG and increase 10 mL every 4 hours to goal rate of 120 mL/hr for 14 hours daily from 6pm until 8 am. Flush with 120 mL water before and after continuous feeding. Flush with additional 240 mL water QID. 7 Bottle 0  . ondansetron (ZOFRAN ODT) 8 MG disintegrating tablet Take 1 tablet (8 mg total) by mouth every 8 (eight) hours as needed for nausea or vomiting. 30 tablet 5  . ondansetron (ZOFRAN) 8 MG tablet Take 1 tablet (8 mg total) by mouth 2 (two) times daily as needed. Start on the third day after chemotherapy. (Patient not taking: Reported on 05/04/2016) 30 tablet 1  .  prochlorperazine (COMPAZINE) 10 MG tablet TAKE 1 TABLET BY MOUTH EVERY 6 HOURS AS NEEDED FOR NAUSEA AND VOMITING 30 tablet 1  . scopolamine (TRANSDERM-SCOP) 1 MG/3DAYS Place 1 patch (1.5 mg total) onto the skin every 3 (three) days. 10 patch 12  . senna (SENOKOT) 8.6 MG tablet Take 1 tablet by mouth 2 (two) times daily.    Marland Kitchen triamcinolone (KENALOG) 0.1 % paste APPLY TO AFFECTED AREA    . zolpidem (AMBIEN) 10 MG tablet Take 10 mg by mouth at bedtime.      No current facility-administered medications for this visit.    Facility-Administered Medications Ordered in Other Visits  Medication Dose Route Frequency Provider Last Rate Last Dose  . heparin lock flush 100 unit/mL  500 Units Intracatheter Once PRN Alvy Bimler, Koven Belinsky, MD      . sodium chloride flush (NS) 0.9 % injection 10 mL  10 mL Intracatheter PRN Alvy Bimler, Cortnie Ringel, MD        PHYSICAL EXAMINATION: ECOG PERFORMANCE STATUS: 1 - Symptomatic but completely ambulatory  Vitals:   05/11/16 1132  BP: 135/76  Pulse: 96  Resp: 18  Temp: 99 F (37.2 C)   Filed Weights   05/11/16 1132  Weight: 193 lb 12.8 oz (87.9 kg)    GENERAL:alert, no distress and comfortable SKIN: skin color, texture, turgor are normal, no rashes or significant lesions EYES: normal, Conjunctiva are pink and non-injected, sclera clear OROPHARYNX: Well-healed surgical scar from tongue resection.  Noted oral secretion.  No mucositis or thrush NECK: Noted lymphedema around her neck. LYMPH:  no palpable lymphadenopathy in the cervical, axillary or inguinal LUNGS: clear to auscultation and percussion with normal breathing effort HEART: regular rate & rhythm and no murmurs and no lower extremity edema ABDOMEN:abdomen soft, non-tender and normal bowel sounds Musculoskeletal:no cyanosis of digits and no clubbing  NEURO: alert & oriented x 3 with fluent speech, no focal motor/sensory deficits  LABORATORY DATA:  I have reviewed the data as listed    Component Value Date/Time    NA 140 05/04/2016 1031   K 3.6 05/04/2016 1031   CL 99 (L) 02/07/2016 0836   CO2 28 05/04/2016 1031   GLUCOSE 96 05/04/2016 1031   BUN 13.1 05/04/2016 1031   CREATININE 1.1 05/04/2016 1031   CALCIUM 9.4 05/04/2016 1031   PROT 7.1 05/04/2016 1031   ALBUMIN 3.5 05/04/2016 1031   AST 31 05/04/2016 1031   ALT 20 05/04/2016 1031   ALKPHOS 101 05/04/2016 1031   BILITOT 0.69 05/04/2016 1031   GFRNONAA 45 (L) 02/08/2016 1859   GFRAA 52 (L) 02/08/2016 1859    No results found for: SPEP, UPEP  Lab Results  Component Value Date  WBC 6.7 05/04/2016   NEUTROABS 5.5 05/04/2016   HGB 11.4 (L) 05/04/2016   HCT 32.6 (L) 05/04/2016   MCV 84.1 05/04/2016   PLT 233 05/04/2016      Chemistry      Component Value Date/Time   NA 140 05/04/2016 1031   K 3.6 05/04/2016 1031   CL 99 (L) 02/07/2016 0836   CO2 28 05/04/2016 1031   BUN 13.1 05/04/2016 1031   CREATININE 1.1 05/04/2016 1031      Component Value Date/Time   CALCIUM 9.4 05/04/2016 1031   ALKPHOS 101 05/04/2016 1031   AST 31 05/04/2016 1031   ALT 20 05/04/2016 1031   BILITOT 0.69 05/04/2016 1031      ASSESSMENT & PLAN:  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 Most of the side effects of treatment are resolving She will continue aggressive supportive care I will space out her return visit appointment and see her back at the end of the month. She has appointment to see radiation oncology next week.  Cancer associated pain Her pain is better control. I recommend slow taper of fentanyl from 75 mcg to 50 mcg next week.  Continuous salivary secretion This is much improved.  I recommend consideration of stopping scopolamine patch next week  Weight loss Weight loss has resolved with aggressive nutrition support through the dietitian. She is contemplating using feeding pump. I recommend also trial of oral intake as tolerated  Acquired lymphedema She has acquired  lymphedema around the neck from radiation treatment. Recommend physical therapy as tolerated.   No orders of the defined types were placed in this encounter.  All questions were answered. The patient knows to call the clinic with any problems, questions or concerns. No barriers to learning was detected. I spent 15 minutes counseling the patient face to face. The total time spent in the appointment was 20 minutes and more than 50% was on counseling and review of test results     [name], MD 05/11/2016 3:59 PM

## 2016-05-11 NOTE — Progress Notes (Signed)
Brief nutrition follow-up with patient during IV fluids. Patient reports she is swallowing ice chips. She had continued nausea. Her sore throat has improved slightly and her thick saliva is also improved Patient reports she has not yet received continuous feeding pump. She states she is having trouble with her insurance company. I educated patient on continuous feeding.  Encouraged her to begin once feeding pump is delivered.  Questions were answered.  Teach back method used.  **Disclaimer: This note was dictated with voice recognition software. Similar sounding words can inadvertently be transcribed and this note may contain transcription errors which may not have been corrected upon publication of note.**

## 2016-05-11 NOTE — Assessment & Plan Note (Signed)
Weight loss has resolved with aggressive nutrition support through the dietitian. She is contemplating using feeding pump. I recommend also trial of oral intake as tolerated

## 2016-05-11 NOTE — Assessment & Plan Note (Signed)
Her pain is better control. I recommend slow taper of fentanyl from 75 mcg to 50 mcg next week.

## 2016-05-11 NOTE — Assessment & Plan Note (Signed)
She has acquired lymphedema around the neck from radiation treatment. Recommend physical therapy as tolerated.

## 2016-05-14 ENCOUNTER — Ambulatory Visit (HOSPITAL_BASED_OUTPATIENT_CLINIC_OR_DEPARTMENT_OTHER): Payer: 59

## 2016-05-14 VITALS — BP 116/63 | HR 95 | Temp 98.8°F | Resp 18 | Ht 67.0 in | Wt 191.6 lb

## 2016-05-14 DIAGNOSIS — C023 Malignant neoplasm of anterior two-thirds of tongue, part unspecified: Secondary | ICD-10-CM

## 2016-05-14 MED ORDER — SODIUM CHLORIDE 0.9 % IV SOLN
Freq: Once | INTRAVENOUS | Status: AC
  Administered 2016-05-14: 11:00:00 via INTRAVENOUS

## 2016-05-14 MED ORDER — ONDANSETRON HCL 4 MG/2ML IJ SOLN
INTRAMUSCULAR | Status: AC
  Filled 2016-05-14: qty 4

## 2016-05-14 MED ORDER — SODIUM CHLORIDE 0.9% FLUSH
10.0000 mL | INTRAVENOUS | Status: DC | PRN
  Administered 2016-05-14: 10 mL
  Filled 2016-05-14: qty 10

## 2016-05-14 MED ORDER — HEPARIN SOD (PORK) LOCK FLUSH 100 UNIT/ML IV SOLN
500.0000 [IU] | Freq: Once | INTRAVENOUS | Status: AC | PRN
  Administered 2016-05-14: 500 [IU]
  Filled 2016-05-14: qty 5

## 2016-05-14 MED ORDER — ONDANSETRON HCL 4 MG/2ML IJ SOLN
8.0000 mg | Freq: Once | INTRAMUSCULAR | Status: AC
  Administered 2016-05-14: 8 mg via INTRAVENOUS

## 2016-05-14 NOTE — Patient Instructions (Signed)
Dehydration, Adult Dehydration is a condition in which there is not enough fluid or water in the body. This happens when you lose more fluids than you take in. Important organs, such as the kidneys, brain, and heart, cannot function without a proper amount of fluids. Any loss of fluids from the body can lead to dehydration. Dehydration can range from mild to severe. This condition should be treated right away to prevent it from becoming severe. What are the causes? This condition may be caused by:  Vomiting.  Diarrhea.  Excessive sweating, such as from heat exposure or exercise.  Not drinking enough fluid, especially:  When ill.  While doing activity that requires a lot of energy.  Excessive urination.  Fever.  Infection.  Certain medicines, such as medicines that cause the body to lose excess fluid (diuretics).  Inability to access safe drinking water.  Reduced physical ability to get adequate water and food. What increases the risk? This condition is more likely to develop in people:  Who have a poorly controlled long-term (chronic) illness, such as diabetes, heart disease, or kidney disease.  Who are age 65 or older.  Who are disabled.  Who live in a place with high altitude.  Who play endurance sports. What are the signs or symptoms? Symptoms of mild dehydration may include:   Thirst.  Dry lips.  Slightly dry mouth.  Dry, warm skin.  Dizziness. Symptoms of moderate dehydration may include:   Very dry mouth.  Muscle cramps.  Dark urine. Urine may be the color of tea.  Decreased urine production.  Decreased tear production.  Heartbeat that is irregular or faster than normal (palpitations).  Headache.  Light-headedness, especially when you stand up from a sitting position.  Fainting (syncope). Symptoms of severe dehydration may include:   Changes in skin, such as:  Cold and clammy skin.  Blotchy (mottled) or pale skin.  Skin that does  not quickly return to normal after being lightly pinched and released (poor skin turgor).  Changes in body fluids, such as:  Extreme thirst.  No tear production.  Inability to sweat when body temperature is high, such as in hot weather.  Very little urine production.  Changes in vital signs, such as:  Weak pulse.  Pulse that is more than 100 beats a minute when sitting still.  Rapid breathing.  Low blood pressure.  Other changes, such as:  Sunken eyes.  Cold hands and feet.  Confusion.  Lack of energy (lethargy).  Difficulty waking up from sleep.  Short-term weight loss.  Unconsciousness. How is this diagnosed? This condition is diagnosed based on your symptoms and a physical exam. Blood and urine tests may be done to help confirm the diagnosis. How is this treated? Treatment for this condition depends on the severity. Mild or moderate dehydration can often be treated at home. Treatment should be started right away. Do not wait until dehydration becomes severe. Severe dehydration is an emergency and it needs to be treated in a hospital. Treatment for mild dehydration may include:   Drinking more fluids.  Replacing salts and minerals in your blood (electrolytes) that you may have lost. Treatment for moderate dehydration may include:   Drinking an oral rehydration solution (ORS). This is a drink that helps you replace fluids and electrolytes (rehydrate). It can be found at pharmacies and retail stores. Treatment for severe dehydration may include:   Receiving fluids through an IV tube.  Receiving an electrolyte solution through a feeding tube that is   passed through your nose and into your stomach (nasogastric tube, or NG tube).  Correcting any abnormalities in electrolytes.  Treating the underlying cause of dehydration. Follow these instructions at home:  If directed by your health care provider, drink an ORS:  Make an ORS by following instructions on the  package.  Start by drinking small amounts, about  cup (120 mL) every 5-10 minutes.  Slowly increase how much you drink until you have taken the amount recommended by your health care provider.  Drink enough clear fluid to keep your urine clear or pale yellow. If you were told to drink an ORS, finish the ORS first, then start slowly drinking other clear fluids. Drink fluids such as:  Water. Do not drink only water. Doing that can lead to having too little salt (sodium) in the body (hyponatremia).  Ice chips.  Fruit juice that you have added water to (diluted fruit juice).  Low-calorie sports drinks.  Avoid:  Alcohol.  Drinks that contain a lot of sugar. These include high-calorie sports drinks, fruit juice that is not diluted, and soda.  Caffeine.  Foods that are greasy or contain a lot of fat or sugar.  Take over-the-counter and prescription medicines only as told by your health care provider.  Do not take sodium tablets. This can lead to having too much sodium in the body (hypernatremia).  Eat foods that contain a healthy balance of electrolytes, such as bananas, oranges, potatoes, tomatoes, and spinach.  Keep all follow-up visits as told by your health care provider. This is important. Contact a health care provider if:  You have abdominal pain that:  Gets worse.  Stays in one area (localizes).  You have a rash.  You have a stiff neck.  You are more irritable than usual.  You are sleepier or more difficult to wake up than usual.  You feel weak or dizzy.  You feel very thirsty.  You have urinated only a small amount of very dark urine over 6-8 hours. Get help right away if:  You have symptoms of severe dehydration.  You cannot drink fluids without vomiting.  Your symptoms get worse with treatment.  You have a fever.  You have a severe headache.  You have vomiting or diarrhea that:  Gets worse.  Does not go away.  You have blood or green matter  (bile) in your vomit.  You have blood in your stool. This may cause stool to look black and tarry.  You have not urinated in 6-8 hours.  You faint.  Your heart rate while sitting still is over 100 beats a minute.  You have trouble breathing. This information is not intended to replace advice given to you by your health care provider. Make sure you discuss any questions you have with your health care provider. Document Released: 12/18/2004 Document Revised: 07/15/2015 Document Reviewed: 02/11/2015 Elsevier Interactive Patient Education  2017 [employer]  

## 2016-05-14 NOTE — Progress Notes (Signed)
Ok to infuse fluids @ 750/hour per Dr. Alvy Bimler

## 2016-05-15 ENCOUNTER — Ambulatory Visit: Payer: 59 | Admitting: Nutrition

## 2016-05-15 ENCOUNTER — Ambulatory Visit (HOSPITAL_BASED_OUTPATIENT_CLINIC_OR_DEPARTMENT_OTHER): Payer: 59

## 2016-05-15 ENCOUNTER — Telehealth: Payer: Self-pay | Admitting: Hematology and Oncology

## 2016-05-15 VITALS — BP 119/64 | HR 89 | Temp 98.8°F | Resp 17 | Ht 67.0 in | Wt 192.9 lb

## 2016-05-15 DIAGNOSIS — C023 Malignant neoplasm of anterior two-thirds of tongue, part unspecified: Secondary | ICD-10-CM | POA: Diagnosis not present

## 2016-05-15 DIAGNOSIS — R110 Nausea: Secondary | ICD-10-CM | POA: Diagnosis not present

## 2016-05-15 MED ORDER — HEPARIN SOD (PORK) LOCK FLUSH 100 UNIT/ML IV SOLN
500.0000 [IU] | Freq: Once | INTRAVENOUS | Status: AC | PRN
  Administered 2016-05-15: 500 [IU]
  Filled 2016-05-15: qty 5

## 2016-05-15 MED ORDER — ONDANSETRON HCL 4 MG/2ML IJ SOLN
8.0000 mg | Freq: Once | INTRAMUSCULAR | Status: AC
  Administered 2016-05-15: 8 mg via INTRAVENOUS

## 2016-05-15 MED ORDER — ONDANSETRON HCL 4 MG/2ML IJ SOLN
INTRAMUSCULAR | Status: AC
  Filled 2016-05-15: qty 4

## 2016-05-15 MED ORDER — SODIUM CHLORIDE 0.9 % IV SOLN
Freq: Once | INTRAVENOUS | Status: AC
  Administered 2016-05-15: 11:00:00 via INTRAVENOUS

## 2016-05-15 MED ORDER — SODIUM CHLORIDE 0.9% FLUSH
10.0000 mL | INTRAVENOUS | Status: DC | PRN
  Administered 2016-05-15: 10 mL
  Filled 2016-05-15: qty 10

## 2016-05-15 NOTE — Telephone Encounter (Signed)
Per 5/15 schedule message changed time of [facility] fluids to 9 am. Patient currently in [facility] and will get new schedule.

## 2016-05-15 NOTE — Patient Instructions (Signed)
Dehydration, Adult Dehydration is a condition in which there is not enough fluid or water in the body. This happens when you lose more fluids than you take in. Important organs, such as the kidneys, brain, and heart, cannot function without a proper amount of fluids. Any loss of fluids from the body can lead to dehydration. Dehydration can range from mild to severe. This condition should be treated right away to prevent it from becoming severe. What are the causes? This condition may be caused by:  Vomiting.  Diarrhea.  Excessive sweating, such as from heat exposure or exercise.  Not drinking enough fluid, especially:  When ill.  While doing activity that requires a lot of energy.  Excessive urination.  Fever.  Infection.  Certain medicines, such as medicines that cause the body to lose excess fluid (diuretics).  Inability to access safe drinking water.  Reduced physical ability to get adequate water and food. What increases the risk? This condition is more likely to develop in people:  Who have a poorly controlled long-term (chronic) illness, such as diabetes, heart disease, or kidney disease.  Who are age 65 or older.  Who are disabled.  Who live in a place with high altitude.  Who play endurance sports. What are the signs or symptoms? Symptoms of mild dehydration may include:   Thirst.  Dry lips.  Slightly dry mouth.  Dry, warm skin.  Dizziness. Symptoms of moderate dehydration may include:   Very dry mouth.  Muscle cramps.  Dark urine. Urine may be the color of tea.  Decreased urine production.  Decreased tear production.  Heartbeat that is irregular or faster than normal (palpitations).  Headache.  Light-headedness, especially when you stand up from a sitting position.  Fainting (syncope). Symptoms of severe dehydration may include:   Changes in skin, such as:  Cold and clammy skin.  Blotchy (mottled) or pale skin.  Skin that does  not quickly return to normal after being lightly pinched and released (poor skin turgor).  Changes in body fluids, such as:  Extreme thirst.  No tear production.  Inability to sweat when body temperature is high, such as in hot weather.  Very little urine production.  Changes in vital signs, such as:  Weak pulse.  Pulse that is more than 100 beats a minute when sitting still.  Rapid breathing.  Low blood pressure.  Other changes, such as:  Sunken eyes.  Cold hands and feet.  Confusion.  Lack of energy (lethargy).  Difficulty waking up from sleep.  Short-term weight loss.  Unconsciousness. How is this diagnosed? This condition is diagnosed based on your symptoms and a physical exam. Blood and urine tests may be done to help confirm the diagnosis. How is this treated? Treatment for this condition depends on the severity. Mild or moderate dehydration can often be treated at home. Treatment should be started right away. Do not wait until dehydration becomes severe. Severe dehydration is an emergency and it needs to be treated in a hospital. Treatment for mild dehydration may include:   Drinking more fluids.  Replacing salts and minerals in your blood (electrolytes) that you may have lost. Treatment for moderate dehydration may include:   Drinking an oral rehydration solution (ORS). This is a drink that helps you replace fluids and electrolytes (rehydrate). It can be found at pharmacies and retail stores. Treatment for severe dehydration may include:   Receiving fluids through an IV tube.  Receiving an electrolyte solution through a feeding tube that is   passed through your nose and into your stomach (nasogastric tube, or NG tube).  Correcting any abnormalities in electrolytes.  Treating the underlying cause of dehydration. Follow these instructions at home:  If directed by your health care provider, drink an ORS:  Make an ORS by following instructions on the  package.  Start by drinking small amounts, about  cup (120 mL) every 5-10 minutes.  Slowly increase how much you drink until you have taken the amount recommended by your health care provider.  Drink enough clear fluid to keep your urine clear or pale yellow. If you were told to drink an ORS, finish the ORS first, then start slowly drinking other clear fluids. Drink fluids such as:  Water. Do not drink only water. Doing that can lead to having too little salt (sodium) in the body (hyponatremia).  Ice chips.  Fruit juice that you have added water to (diluted fruit juice).  Low-calorie sports drinks.  Avoid:  Alcohol.  Drinks that contain a lot of sugar. These include high-calorie sports drinks, fruit juice that is not diluted, and soda.  Caffeine.  Foods that are greasy or contain a lot of fat or sugar.  Take over-the-counter and prescription medicines only as told by your health care provider.  Do not take sodium tablets. This can lead to having too much sodium in the body (hypernatremia).  Eat foods that contain a healthy balance of electrolytes, such as bananas, oranges, potatoes, tomatoes, and spinach.  Keep all follow-up visits as told by your health care provider. This is important. Contact a health care provider if:  You have abdominal pain that:  Gets worse.  Stays in one area (localizes).  You have a rash.  You have a stiff neck.  You are more irritable than usual.  You are sleepier or more difficult to wake up than usual.  You feel weak or dizzy.  You feel very thirsty.  You have urinated only a small amount of very dark urine over 6-8 hours. Get help right away if:  You have symptoms of severe dehydration.  You cannot drink fluids without vomiting.  Your symptoms get worse with treatment.  You have a fever.  You have a severe headache.  You have vomiting or diarrhea that:  Gets worse.  Does not go away.  You have blood or green matter  (bile) in your vomit.  You have blood in your stool. This may cause stool to look black and tarry.  You have not urinated in 6-8 hours.  You faint.  Your heart rate while sitting still is over 100 beats a minute.  You have trouble breathing. This information is not intended to replace advice given to you by your health care provider. Make sure you discuss any questions you have with your health care provider. Document Released: 12/18/2004 Document Revised: 07/15/2015 Document Reviewed: 02/11/2015 Elsevier Interactive Patient Education  2017 [employer]  

## 2016-05-15 NOTE — Progress Notes (Signed)
OK to infuse fluids at 750/hour per Dr. Alvy Bimler

## 2016-05-15 NOTE — Progress Notes (Signed)
Nutrition follow-up completed with patient during status post treatment for tongue cancer. Weight is stable and documented as 192 pounds. Patient reports nausea has improved. She has a bowel movement every other day. She can tolerate a slow bolus tube feeding. Patient has not received continuous feeding pump, secondary to insurance issues. I have contacted [facility]. Patient will receive feeding pump today. This information was communicated with patient. She understands continuous tube feeding advancement. Will continue to follow as needed.

## 2016-05-16 ENCOUNTER — Ambulatory Visit (HOSPITAL_BASED_OUTPATIENT_CLINIC_OR_DEPARTMENT_OTHER): Payer: 59

## 2016-05-16 VITALS — BP 118/71 | HR 91 | Temp 99.5°F | Resp 18 | Ht 67.0 in | Wt 195.1 lb

## 2016-05-16 DIAGNOSIS — C023 Malignant neoplasm of anterior two-thirds of tongue, part unspecified: Secondary | ICD-10-CM

## 2016-05-16 MED ORDER — ONDANSETRON HCL 4 MG/2ML IJ SOLN
INTRAMUSCULAR | Status: AC
  Filled 2016-05-16: qty 4

## 2016-05-16 MED ORDER — SODIUM CHLORIDE 0.9% FLUSH
10.0000 mL | INTRAVENOUS | Status: DC | PRN
  Administered 2016-05-16: 10 mL
  Filled 2016-05-16: qty 10

## 2016-05-16 MED ORDER — HEPARIN SOD (PORK) LOCK FLUSH 100 UNIT/ML IV SOLN
500.0000 [IU] | Freq: Once | INTRAVENOUS | Status: AC | PRN
  Administered 2016-05-16: 500 [IU]
  Filled 2016-05-16: qty 5

## 2016-05-16 MED ORDER — SODIUM CHLORIDE 0.9 % IV SOLN
Freq: Once | INTRAVENOUS | Status: AC
  Administered 2016-05-16: 11:00:00 via INTRAVENOUS

## 2016-05-16 MED ORDER — ONDANSETRON HCL 4 MG/2ML IJ SOLN
8.0000 mg | Freq: Once | INTRAMUSCULAR | Status: AC
  Administered 2016-05-16: 8 mg via INTRAVENOUS

## 2016-05-16 NOTE — Patient Instructions (Signed)
Dehydration, Adult Dehydration is a condition in which there is not enough fluid or water in the body. This happens when you lose more fluids than you take in. Important organs, such as the kidneys, brain, and heart, cannot function without a proper amount of fluids. Any loss of fluids from the body can lead to dehydration. Dehydration can range from mild to severe. This condition should be treated right away to prevent it from becoming severe. What are the causes? This condition may be caused by:  Vomiting.  Diarrhea.  Excessive sweating, such as from heat exposure or exercise.  Not drinking enough fluid, especially:  When ill.  While doing activity that requires a lot of energy.  Excessive urination.  Fever.  Infection.  Certain medicines, such as medicines that cause the body to lose excess fluid (diuretics).  Inability to access safe drinking water.  Reduced physical ability to get adequate water and food. What increases the risk? This condition is more likely to develop in people:  Who have a poorly controlled long-term (chronic) illness, such as diabetes, heart disease, or kidney disease.  Who are age 65 or older.  Who are disabled.  Who live in a place with high altitude.  Who play endurance sports. What are the signs or symptoms? Symptoms of mild dehydration may include:   Thirst.  Dry lips.  Slightly dry mouth.  Dry, warm skin.  Dizziness. Symptoms of moderate dehydration may include:   Very dry mouth.  Muscle cramps.  Dark urine. Urine may be the color of tea.  Decreased urine production.  Decreased tear production.  Heartbeat that is irregular or faster than normal (palpitations).  Headache.  Light-headedness, especially when you stand up from a sitting position.  Fainting (syncope). Symptoms of severe dehydration may include:   Changes in skin, such as:  Cold and clammy skin.  Blotchy (mottled) or pale skin.  Skin that does  not quickly return to normal after being lightly pinched and released (poor skin turgor).  Changes in body fluids, such as:  Extreme thirst.  No tear production.  Inability to sweat when body temperature is high, such as in hot weather.  Very little urine production.  Changes in vital signs, such as:  Weak pulse.  Pulse that is more than 100 beats a minute when sitting still.  Rapid breathing.  Low blood pressure.  Other changes, such as:  Sunken eyes.  Cold hands and feet.  Confusion.  Lack of energy (lethargy).  Difficulty waking up from sleep.  Short-term weight loss.  Unconsciousness. How is this diagnosed? This condition is diagnosed based on your symptoms and a physical exam. Blood and urine tests may be done to help confirm the diagnosis. How is this treated? Treatment for this condition depends on the severity. Mild or moderate dehydration can often be treated at home. Treatment should be started right away. Do not wait until dehydration becomes severe. Severe dehydration is an emergency and it needs to be treated in a hospital. Treatment for mild dehydration may include:   Drinking more fluids.  Replacing salts and minerals in your blood (electrolytes) that you may have lost. Treatment for moderate dehydration may include:   Drinking an oral rehydration solution (ORS). This is a drink that helps you replace fluids and electrolytes (rehydrate). It can be found at pharmacies and retail stores. Treatment for severe dehydration may include:   Receiving fluids through an IV tube.  Receiving an electrolyte solution through a feeding tube that is   passed through your nose and into your stomach (nasogastric tube, or NG tube).  Correcting any abnormalities in electrolytes.  Treating the underlying cause of dehydration. Follow these instructions at home:  If directed by your health care provider, drink an ORS:  Make an ORS by following instructions on the  package.  Start by drinking small amounts, about  cup (120 mL) every 5-10 minutes.  Slowly increase how much you drink until you have taken the amount recommended by your health care provider.  Drink enough clear fluid to keep your urine clear or pale yellow. If you were told to drink an ORS, finish the ORS first, then start slowly drinking other clear fluids. Drink fluids such as:  Water. Do not drink only water. Doing that can lead to having too little salt (sodium) in the body (hyponatremia).  Ice chips.  Fruit juice that you have added water to (diluted fruit juice).  Low-calorie sports drinks.  Avoid:  Alcohol.  Drinks that contain a lot of sugar. These include high-calorie sports drinks, fruit juice that is not diluted, and soda.  Caffeine.  Foods that are greasy or contain a lot of fat or sugar.  Take over-the-counter and prescription medicines only as told by your health care provider.  Do not take sodium tablets. This can lead to having too much sodium in the body (hypernatremia).  Eat foods that contain a healthy balance of electrolytes, such as bananas, oranges, potatoes, tomatoes, and spinach.  Keep all follow-up visits as told by your health care provider. This is important. Contact a health care provider if:  You have abdominal pain that:  Gets worse.  Stays in one area (localizes).  You have a rash.  You have a stiff neck.  You are more irritable than usual.  You are sleepier or more difficult to wake up than usual.  You feel weak or dizzy.  You feel very thirsty.  You have urinated only a small amount of very dark urine over 6-8 hours. Get help right away if:  You have symptoms of severe dehydration.  You cannot drink fluids without vomiting.  Your symptoms get worse with treatment.  You have a fever.  You have a severe headache.  You have vomiting or diarrhea that:  Gets worse.  Does not go away.  You have blood or green matter  (bile) in your vomit.  You have blood in your stool. This may cause stool to look black and tarry.  You have not urinated in 6-8 hours.  You faint.  Your heart rate while sitting still is over 100 beats a minute.  You have trouble breathing. This information is not intended to replace advice given to you by your health care provider. Make sure you discuss any questions you have with your health care provider. Document Released: 12/18/2004 Document Revised: 07/15/2015 Document Reviewed: 02/11/2015 Elsevier Interactive Patient Education  2017 [employer]  

## 2016-05-16 NOTE — Progress Notes (Signed)
Pt received pump for enteral feedings yesterday and started using it last night .  She states she did 4 hours of feedings from 6:30p to 10:30 pm and tolerated well. Advised pt to extend the hours tonight to get as much benefit as possible. She was able to tolerate 49ml/hr  No problems with nausea/diarrhea.

## 2016-05-17 ENCOUNTER — Ambulatory Visit: Payer: Self-pay

## 2016-05-17 ENCOUNTER — Telehealth: Payer: Self-pay | Admitting: *Deleted

## 2016-05-17 MED FILL — LIDOCAINE-PRILOCAINE CREAM: 2.5-2.5 | 10 days supply | Qty: 30 | Fill #2

## 2016-05-17 MED FILL — TRANSDERM-SCOP 1.5 MG/3 DAY: 1 | 30 days supply | Qty: 10 | Fill #1

## 2016-05-17 NOTE — Telephone Encounter (Signed)
Per Caryl Pina, phone triage/operator..the patient called this am to cancel appt for IV fluids.

## 2016-05-18 ENCOUNTER — Ambulatory Visit
Admission: RE | Admit: 2016-05-18 | Discharge: 2016-05-18 | Disposition: A | Discharge status: Home / Self Care | Payer: 59 | Source: Ambulatory Visit | Attending: Radiation Oncology | Admitting: Radiation Oncology

## 2016-05-18 ENCOUNTER — Ambulatory Visit (HOSPITAL_BASED_OUTPATIENT_CLINIC_OR_DEPARTMENT_OTHER): Payer: 59

## 2016-05-18 ENCOUNTER — Encounter: Payer: Self-pay | Admitting: Radiation Oncology

## 2016-05-18 VITALS — BP 134/76 | HR 89 | Temp 99.3°F | Ht 67.0 in | Wt 195.2 lb

## 2016-05-18 VITALS — BP 132/53 | HR 92 | Temp 98.6°F | Resp 18 | Ht 67.0 in | Wt 193.6 lb

## 2016-05-18 DIAGNOSIS — Z510 Encounter for antineoplastic radiation therapy: Secondary | ICD-10-CM | POA: Diagnosis not present

## 2016-05-18 DIAGNOSIS — C029 Malignant neoplasm of tongue, unspecified: Secondary | ICD-10-CM

## 2016-05-18 DIAGNOSIS — C023 Malignant neoplasm of anterior two-thirds of tongue, part unspecified: Secondary | ICD-10-CM | POA: Diagnosis not present

## 2016-05-18 HISTORY — DX: Personal history of irradiation: Z92.3

## 2016-05-18 MED ORDER — SODIUM CHLORIDE 0.9% FLUSH
10.0000 mL | INTRAVENOUS | Status: DC | PRN
  Administered 2016-05-18: 10 mL
  Filled 2016-05-18: qty 10

## 2016-05-18 MED ORDER — SODIUM CHLORIDE 0.9 % IV SOLN
Freq: Once | INTRAVENOUS | Status: AC
  Administered 2016-05-18: 11:00:00 via INTRAVENOUS

## 2016-05-18 MED ORDER — HEPARIN SOD (PORK) LOCK FLUSH 100 UNIT/ML IV SOLN
250.0000 [IU] | Freq: Once | INTRAVENOUS | Status: DC | PRN
  Filled 2016-05-18: qty 5

## 2016-05-18 MED ORDER — ONDANSETRON HCL 4 MG/2ML IJ SOLN
8.0000 mg | Freq: Once | INTRAMUSCULAR | Status: AC
  Administered 2016-05-18: 8 mg via INTRAVENOUS

## 2016-05-18 MED ORDER — HEPARIN SOD (PORK) LOCK FLUSH 100 UNIT/ML IV SOLN
500.0000 [IU] | Freq: Once | INTRAVENOUS | Status: AC | PRN
  Administered 2016-05-18: 500 [IU]
  Filled 2016-05-18: qty 5

## 2016-05-18 MED ORDER — ONDANSETRON HCL 4 MG/2ML IJ SOLN
INTRAMUSCULAR | Status: AC
  Filled 2016-05-18: qty 4

## 2016-05-18 MED ORDER — ALTEPLASE 2 MG IJ SOLR
2.0000 mg | Freq: Once | INTRAMUSCULAR | Status: DC | PRN
  Filled 2016-05-18: qty 2

## 2016-05-18 MED FILL — ONDANSETRON ODT 8 MG TABLET: 8 | 10 days supply | Qty: 30 | Fill #2

## 2016-05-18 NOTE — Patient Instructions (Signed)
Dehydration, Adult Dehydration is a condition in which there is not enough fluid or water in the body. This happens when you lose more fluids than you take in. Important organs, such as the kidneys, brain, and heart, cannot function without a proper amount of fluids. Any loss of fluids from the body can lead to dehydration. Dehydration can range from mild to severe. This condition should be treated right away to prevent it from becoming severe. What are the causes? This condition may be caused by:  Vomiting.  Diarrhea.  Excessive sweating, such as from heat exposure or exercise.  Not drinking enough fluid, especially:  When ill.  While doing activity that requires a lot of energy.  Excessive urination.  Fever.  Infection.  Certain medicines, such as medicines that cause the body to lose excess fluid (diuretics).  Inability to access safe drinking water.  Reduced physical ability to get adequate water and food. What increases the risk? This condition is more likely to develop in people:  Who have a poorly controlled long-term (chronic) illness, such as diabetes, heart disease, or kidney disease.  Who are age 65 or older.  Who are disabled.  Who live in a place with high altitude.  Who play endurance sports. What are the signs or symptoms? Symptoms of mild dehydration may include:   Thirst.  Dry lips.  Slightly dry mouth.  Dry, warm skin.  Dizziness. Symptoms of moderate dehydration may include:   Very dry mouth.  Muscle cramps.  Dark urine. Urine may be the color of tea.  Decreased urine production.  Decreased tear production.  Heartbeat that is irregular or faster than normal (palpitations).  Headache.  Light-headedness, especially when you stand up from a sitting position.  Fainting (syncope). Symptoms of severe dehydration may include:   Changes in skin, such as:  Cold and clammy skin.  Blotchy (mottled) or pale skin.  Skin that does  not quickly return to normal after being lightly pinched and released (poor skin turgor).  Changes in body fluids, such as:  Extreme thirst.  No tear production.  Inability to sweat when body temperature is high, such as in hot weather.  Very little urine production.  Changes in vital signs, such as:  Weak pulse.  Pulse that is more than 100 beats a minute when sitting still.  Rapid breathing.  Low blood pressure.  Other changes, such as:  Sunken eyes.  Cold hands and feet.  Confusion.  Lack of energy (lethargy).  Difficulty waking up from sleep.  Short-term weight loss.  Unconsciousness. How is this diagnosed? This condition is diagnosed based on your symptoms and a physical exam. Blood and urine tests may be done to help confirm the diagnosis. How is this treated? Treatment for this condition depends on the severity. Mild or moderate dehydration can often be treated at home. Treatment should be started right away. Do not wait until dehydration becomes severe. Severe dehydration is an emergency and it needs to be treated in a hospital. Treatment for mild dehydration may include:   Drinking more fluids.  Replacing salts and minerals in your blood (electrolytes) that you may have lost. Treatment for moderate dehydration may include:   Drinking an oral rehydration solution (ORS). This is a drink that helps you replace fluids and electrolytes (rehydrate). It can be found at pharmacies and retail stores. Treatment for severe dehydration may include:   Receiving fluids through an IV tube.  Receiving an electrolyte solution through a feeding tube that is   passed through your nose and into your stomach (nasogastric tube, or NG tube).  Correcting any abnormalities in electrolytes.  Treating the underlying cause of dehydration. Follow these instructions at home:  If directed by your health care provider, drink an ORS:  Make an ORS by following instructions on the  package.  Start by drinking small amounts, about  cup (120 mL) every 5-10 minutes.  Slowly increase how much you drink until you have taken the amount recommended by your health care provider.  Drink enough clear fluid to keep your urine clear or pale yellow. If you were told to drink an ORS, finish the ORS first, then start slowly drinking other clear fluids. Drink fluids such as:  Water. Do not drink only water. Doing that can lead to having too little salt (sodium) in the body (hyponatremia).  Ice chips.  Fruit juice that you have added water to (diluted fruit juice).  Low-calorie sports drinks.  Avoid:  Alcohol.  Drinks that contain a lot of sugar. These include high-calorie sports drinks, fruit juice that is not diluted, and soda.  Caffeine.  Foods that are greasy or contain a lot of fat or sugar.  Take over-the-counter and prescription medicines only as told by your health care provider.  Do not take sodium tablets. This can lead to having too much sodium in the body (hypernatremia).  Eat foods that contain a healthy balance of electrolytes, such as bananas, oranges, potatoes, tomatoes, and spinach.  Keep all follow-up visits as told by your health care provider. This is important. Contact a health care provider if:  You have abdominal pain that:  Gets worse.  Stays in one area (localizes).  You have a rash.  You have a stiff neck.  You are more irritable than usual.  You are sleepier or more difficult to wake up than usual.  You feel weak or dizzy.  You feel very thirsty.  You have urinated only a small amount of very dark urine over 6-8 hours. Get help right away if:  You have symptoms of severe dehydration.  You cannot drink fluids without vomiting.  Your symptoms get worse with treatment.  You have a fever.  You have a severe headache.  You have vomiting or diarrhea that:  Gets worse.  Does not go away.  You have blood or green matter  (bile) in your vomit.  You have blood in your stool. This may cause stool to look black and tarry.  You have not urinated in 6-8 hours.  You faint.  Your heart rate while sitting still is over 100 beats a minute.  You have trouble breathing. This information is not intended to replace advice given to you by your health care provider. Make sure you discuss any questions you have with your health care provider. Document Released: 12/18/2004 Document Revised: 07/15/2015 Document Reviewed: 02/11/2015 Elsevier Interactive Patient Education  2017 [employer]  

## 2016-05-18 NOTE — Progress Notes (Signed)
Radiation Oncology         [phone] ________________________________  Name: Tiffany Yu MRN: [id number]  Date: 05/18/2016  DOB: 09-25-1955  Follow-Up Visit Note  CC: Bernerd Limbo, MD  Jerrell Belfast, MD  Diagnosis and Prior Radiotherapy:       ICD-9-CM ICD-10-CM   1. 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H[facility]) 141.4 C02.3   2. 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  On review of systems, she reports she still has some pain with swallowing and is still on fentanyl and morphine. She receives continuous feeds at night through PEG and is also swallowing soft foods and water. She denies tobacco use. She continues IV fluids. The patient reports she continues to use Radiaplex to the skin of her neck.         ALLERGIES:  has No Known Allergies.  Meds: Current Outpatient Prescriptions  Medication Sig Dispense Refill  . ALPRAZolam (XANAX) 0.5 MG tablet Take 0.5 mg by mouth at bedtime as needed for sleep.     Marland Kitchen atorvastatin (LIPITOR) 10 MG tablet Take 10 mg by mouth daily at 6 PM.     . buPROPion (ZYBAN) 150 MG 12 hr tablet Take 150 mg by mouth daily.     . cephALEXin (KEFLEX) 250 MG/5ML suspension Place 10 mLs (500 mg total) into feeding tube 3 (three) times daily. 300 mL 0  . chlorhexidine (PERIDEX) 0.12 % solution Rinse with 15 mls three times daily for 30 seconds. Use after breakfast, dinner, and at bedtime. Spit out excess. Do not swallow. 480 mL prn  . fentaNYL (DURAGESIC - DOSED MCG/HR) 75 MCG/HR Place 1 patch (75 mcg total) onto the skin every 3 (three) days. 5 patch 0  .  gabapentin (NEURONTIN) 300 MG capsule Take 1 capsule (300 mg total) by mouth 2 (two) times daily as needed. 28 capsule 0  . levothyroxine (SYNTHROID, LEVOTHROID) 75 MCG tablet Take 75 mcg by mouth daily before breakfast.     . lidocaine-prilocaine (EMLA) cream Apply to affected area once 30 g 3  . LORazepam (ATIVAN) 1 MG tablet Take 1 tablet (1 mg total) by mouth every 8 (eight) hours as needed for anxiety or sleep (nausea). 60 tablet 0  . metoCLOPramide (REGLAN) 10 MG tablet Take 1 tablet (10 mg total) by mouth 4 (four) times daily -  before meals and at bedtime. 90 tablet 3  . ondansetron (ZOFRAN ODT) 8 MG disintegrating tablet Take 1 tablet (8 mg total) by mouth every 8 (eight) hours as needed for nausea or vomiting. 30 tablet 5  . prochlorperazine (COMPAZINE) 10 MG tablet TAKE 1 TABLET BY MOUTH EVERY 6 HOURS AS NEEDED FOR NAUSEA AND VOMITING 30 tablet 1  . scopolamine (TRANSDERM-SCOP) 1 MG/3DAYS Place 1 patch (1.5 mg total) onto the skin every 3 (three) days. 10 patch 12  . senna (SENOKOT) 8.6 MG tablet Take 1 tablet by mouth 2 (two) times daily.    Marland Kitchen triamcinolone (KENALOG) 0.1 % paste APPLY TO AFFECTED AREA    . zolpidem (AMBIEN) 10 MG tablet Take 10 mg  by mouth at bedtime.     Marland Kitchen HYDROcodone-acetaminophen (HYCET) 7.5-325 mg/15 ml solution Take 10-15 mLs by mouth every 4 (four) hours as needed for moderate pain. (Patient not taking: Reported on 04/26/2016) 473 mL 0  . lidocaine (XYLOCAINE) 2 % solution Patient: Mix 1part 2% viscous lidocaine, 1part H20. Swish and swallow 22mL of this mixture, 18min before meals and at bedtime, up to QID (Patient not taking: Reported on 05/18/2016) 100 mL 5  . lidocaine (XYLOCAINE) 5 % ointment Apply 1 application topically as needed. Around feeding tube area for severe pain (Patient not taking: Reported on 05/18/2016) 50 g 0  . morphine (ROXANOL) 20 MG/ML concentrated solution Take 1 mL (20 mg total) by mouth every 2 (two) hours as needed for severe pain. (Patient  not taking: Reported on 05/18/2016) 120 mL 0  . Morphine Sulfate (MORPHINE CONCENTRATE) 10 mg / 0.5 ml concentrated solution Take 0.5 mLs (10 mg total) by mouth every 2 (two) hours as needed for severe pain. (Patient not taking: Reported on 05/18/2016) 120 mL 0  . Nutritional Supplements (FEEDING SUPPLEMENT, OSMOLITE 1.5 CAL,) LIQD Begin Osmolite 1.5 at 60 mL/hr via PEG and increase 10 mL every 4 hours to goal rate of 120 mL/hr for 14 hours daily from 6pm until 8 am. Flush with 120 mL water before and after continuous feeding. Flush with additional 240 mL water QID. (Patient not taking: Reported on 05/18/2016) 7 Bottle 0  . ondansetron (ZOFRAN) 8 MG tablet Take 1 tablet (8 mg total) by mouth 2 (two) times daily as needed. Start on the third day after chemotherapy. (Patient not taking: Reported on 05/04/2016) 30 tablet 1   No current facility-administered medications for this encounter.    Facility-Administered Medications Ordered in Other Encounters  Medication Dose Route Frequency Provider Last Rate Last Dose  . heparin lock flush 100 unit/mL  500 Units Intracatheter Once PRN Alvy Bimler, Ni, MD      . sodium chloride flush (NS) 0.9 % injection 10 mL  10 mL Intracatheter PRN [name], MD        Physical Findings: The patient is in no acute distress. Patient is alert and oriented. Wt Readings from Last 3 Encounters:  05/21/16 191 lb 6.4 oz (86.8 kg)  05/18/16 195 lb 3.2 oz (88.5 kg)  05/18/16 193 lb 9.6 oz (87.8 kg)    height is 5\' 7"  (1.702 m) and weight is 195 lb 3.2 oz (88.5 kg). Her temperature is 99.3 F (37.4 C). Her blood pressure is 134/76 and her pulse is 89. Her oxygen saturation is 100%. .  General: Alert and oriented, in no acute distress HEENT: Patient is edentulous. Dentures removed for exam. She still has some thick saliva, but mucositis has resolved. Neck: Neck is notable for well healed skin. No palpable masses in cervical or supraclavicular regions. Skin: Skin in treatment fields  shows satisfactory healing.   Lab Findings: Lab Results  Component Value Date   WBC 6.7 05/04/2016   HGB 11.4 (L) 05/04/2016   HCT 32.6 (L) 05/04/2016   MCV 84.1 05/04/2016   PLT 233 05/04/2016    Lab Results  Component Value Date   TSH 6.595 (H) 03/02/2016    Radiographic Findings: No results found.  Impression/Plan:    1) Head and Neck Cancer Status: NED on clinical exam today. Repeat scans in 2-3 months.  2) Nutritional Status: stable PEG tube: Yes, continuous nightly feeds  3) Risk Factors: The patient has been educated about risk factors including  alcohol and tobacco abuse; they understand that avoidance of alcohol and tobacco is important to prevent recurrences as well as other cancers.  4) Swallowing: Stable, mild difficulty  5) Dental: Encouraged to continue regular followup with dentistry, and dental hygiene including fluoride rinses.   6) Thyroid function: recheck at next visit. Lab Results  Component Value Date   TSH 6.595 (H) 03/02/2016    7) Other: Patient will begin applying Vitamin E oil/cream to the skin of the neck in the treatment fields. She should do this for the next 2-3 months.  In 2-3 months I will order repeat scans. She will follow up with me after these scans. Continue f/u with med/onc.   _____________________________________   Eppie Gibson, MD  This document serves as a record of services personally performed by Eppie Gibson, MD. It was created on her behalf by Maryla Morrow, a trained medical scribe. The creation of this record is based on the scribe's personal observations and the provider's statements to them. This document has been checked and approved by the attending provider.

## 2016-05-21 ENCOUNTER — Ambulatory Visit (HOSPITAL_BASED_OUTPATIENT_CLINIC_OR_DEPARTMENT_OTHER): Payer: 59

## 2016-05-21 ENCOUNTER — Other Ambulatory Visit: Payer: Self-pay | Admitting: Radiation Oncology

## 2016-05-21 VITALS — BP 132/72 | HR 96 | Temp 98.7°F | Resp 18 | Ht 67.0 in | Wt 191.4 lb

## 2016-05-21 DIAGNOSIS — C023 Malignant neoplasm of anterior two-thirds of tongue, part unspecified: Secondary | ICD-10-CM

## 2016-05-21 DIAGNOSIS — Z1329 Encounter for screening for other suspected endocrine disorder: Secondary | ICD-10-CM

## 2016-05-21 DIAGNOSIS — R634 Abnormal weight loss: Secondary | ICD-10-CM

## 2016-05-21 MED ORDER — SODIUM CHLORIDE 0.9 % IV SOLN
Freq: Once | INTRAVENOUS | Status: AC
  Administered 2016-05-21: 10:00:00 via INTRAVENOUS

## 2016-05-21 MED ORDER — HEPARIN SOD (PORK) LOCK FLUSH 100 UNIT/ML IV SOLN
500.0000 [IU] | Freq: Once | INTRAVENOUS | Status: AC | PRN
  Administered 2016-05-21: 500 [IU]
  Filled 2016-05-21: qty 5

## 2016-05-21 MED ORDER — ONDANSETRON HCL 4 MG/2ML IJ SOLN
8.0000 mg | Freq: Once | INTRAMUSCULAR | Status: AC
  Administered 2016-05-21: 8 mg via INTRAVENOUS

## 2016-05-21 MED ORDER — SODIUM CHLORIDE 0.9% FLUSH
10.0000 mL | INTRAVENOUS | Status: DC | PRN
  Administered 2016-05-21: 10 mL
  Filled 2016-05-21: qty 10

## 2016-05-21 MED ORDER — ONDANSETRON HCL 4 MG/2ML IJ SOLN
INTRAMUSCULAR | Status: AC
  Filled 2016-05-21: qty 4

## 2016-05-21 NOTE — Patient Instructions (Signed)
Dehydration, Adult Dehydration is a condition in which there is not enough fluid or water in the body. This happens when you lose more fluids than you take in. Important organs, such as the kidneys, brain, and heart, cannot function without a proper amount of fluids. Any loss of fluids from the body can lead to dehydration. Dehydration can range from mild to severe. This condition should be treated right away to prevent it from becoming severe. What are the causes? This condition may be caused by:  Vomiting.  Diarrhea.  Excessive sweating, such as from heat exposure or exercise.  Not drinking enough fluid, especially:  When ill.  While doing activity that requires a lot of energy.  Excessive urination.  Fever.  Infection.  Certain medicines, such as medicines that cause the body to lose excess fluid (diuretics).  Inability to access safe drinking water.  Reduced physical ability to get adequate water and food. What increases the risk? This condition is more likely to develop in people:  Who have a poorly controlled long-term (chronic) illness, such as diabetes, heart disease, or kidney disease.  Who are age 65 or older.  Who are disabled.  Who live in a place with high altitude.  Who play endurance sports. What are the signs or symptoms? Symptoms of mild dehydration may include:   Thirst.  Dry lips.  Slightly dry mouth.  Dry, warm skin.  Dizziness. Symptoms of moderate dehydration may include:   Very dry mouth.  Muscle cramps.  Dark urine. Urine may be the color of tea.  Decreased urine production.  Decreased tear production.  Heartbeat that is irregular or faster than normal (palpitations).  Headache.  Light-headedness, especially when you stand up from a sitting position.  Fainting (syncope). Symptoms of severe dehydration may include:   Changes in skin, such as:  Cold and clammy skin.  Blotchy (mottled) or pale skin.  Skin that does  not quickly return to normal after being lightly pinched and released (poor skin turgor).  Changes in body fluids, such as:  Extreme thirst.  No tear production.  Inability to sweat when body temperature is high, such as in hot weather.  Very little urine production.  Changes in vital signs, such as:  Weak pulse.  Pulse that is more than 100 beats a minute when sitting still.  Rapid breathing.  Low blood pressure.  Other changes, such as:  Sunken eyes.  Cold hands and feet.  Confusion.  Lack of energy (lethargy).  Difficulty waking up from sleep.  Short-term weight loss.  Unconsciousness. How is this diagnosed? This condition is diagnosed based on your symptoms and a physical exam. Blood and urine tests may be done to help confirm the diagnosis. How is this treated? Treatment for this condition depends on the severity. Mild or moderate dehydration can often be treated at home. Treatment should be started right away. Do not wait until dehydration becomes severe. Severe dehydration is an emergency and it needs to be treated in a hospital. Treatment for mild dehydration may include:   Drinking more fluids.  Replacing salts and minerals in your blood (electrolytes) that you may have lost. Treatment for moderate dehydration may include:   Drinking an oral rehydration solution (ORS). This is a drink that helps you replace fluids and electrolytes (rehydrate). It can be found at pharmacies and retail stores. Treatment for severe dehydration may include:   Receiving fluids through an IV tube.  Receiving an electrolyte solution through a feeding tube that is   passed through your nose and into your stomach (nasogastric tube, or NG tube).  Correcting any abnormalities in electrolytes.  Treating the underlying cause of dehydration. Follow these instructions at home:  If directed by your health care provider, drink an ORS:  Make an ORS by following instructions on the  package.  Start by drinking small amounts, about  cup (120 mL) every 5-10 minutes.  Slowly increase how much you drink until you have taken the amount recommended by your health care provider.  Drink enough clear fluid to keep your urine clear or pale yellow. If you were told to drink an ORS, finish the ORS first, then start slowly drinking other clear fluids. Drink fluids such as:  Water. Do not drink only water. Doing that can lead to having too little salt (sodium) in the body (hyponatremia).  Ice chips.  Fruit juice that you have added water to (diluted fruit juice).  Low-calorie sports drinks.  Avoid:  Alcohol.  Drinks that contain a lot of sugar. These include high-calorie sports drinks, fruit juice that is not diluted, and soda.  Caffeine.  Foods that are greasy or contain a lot of fat or sugar.  Take over-the-counter and prescription medicines only as told by your health care provider.  Do not take sodium tablets. This can lead to having too much sodium in the body (hypernatremia).  Eat foods that contain a healthy balance of electrolytes, such as bananas, oranges, potatoes, tomatoes, and spinach.  Keep all follow-up visits as told by your health care provider. This is important. Contact a health care provider if:  You have abdominal pain that:  Gets worse.  Stays in one area (localizes).  You have a rash.  You have a stiff neck.  You are more irritable than usual.  You are sleepier or more difficult to wake up than usual.  You feel weak or dizzy.  You feel very thirsty.  You have urinated only a small amount of very dark urine over 6-8 hours. Get help right away if:  You have symptoms of severe dehydration.  You cannot drink fluids without vomiting.  Your symptoms get worse with treatment.  You have a fever.  You have a severe headache.  You have vomiting or diarrhea that:  Gets worse.  Does not go away.  You have blood or green matter  (bile) in your vomit.  You have blood in your stool. This may cause stool to look black and tarry.  You have not urinated in 6-8 hours.  You faint.  Your heart rate while sitting still is over 100 beats a minute.  You have trouble breathing. This information is not intended to replace advice given to you by your health care provider. Make sure you discuss any questions you have with your health care provider. Document Released: 12/18/2004 Document Revised: 07/15/2015 Document Reviewed: 02/11/2015 Elsevier Interactive Patient Education  2017 [employer]  

## 2016-05-22 ENCOUNTER — Ambulatory Visit: Payer: Self-pay

## 2016-05-23 ENCOUNTER — Telehealth (HOSPITAL_COMMUNITY): Payer: Self-pay | Admitting: Dentistry

## 2016-05-23 ENCOUNTER — Ambulatory Visit (HOSPITAL_BASED_OUTPATIENT_CLINIC_OR_DEPARTMENT_OTHER): Payer: 59

## 2016-05-23 VITALS — BP 124/59 | HR 88 | Temp 98.7°F | Resp 20 | Ht 67.0 in | Wt 191.6 lb

## 2016-05-23 DIAGNOSIS — C023 Malignant neoplasm of anterior two-thirds of tongue, part unspecified: Secondary | ICD-10-CM | POA: Diagnosis not present

## 2016-05-23 MED ORDER — SODIUM CHLORIDE 0.9 % IV SOLN
Freq: Once | INTRAVENOUS | Status: AC
  Administered 2016-05-23: 11:00:00 via INTRAVENOUS

## 2016-05-23 MED ORDER — SODIUM CHLORIDE 0.9% FLUSH
10.0000 mL | INTRAVENOUS | Status: DC | PRN
  Administered 2016-05-23: 10 mL
  Filled 2016-05-23: qty 10

## 2016-05-23 MED ORDER — ONDANSETRON HCL 4 MG/2ML IJ SOLN
INTRAMUSCULAR | Status: AC
  Filled 2016-05-23: qty 2

## 2016-05-23 MED ORDER — ONDANSETRON HCL 4 MG/2ML IJ SOLN
8.0000 mg | Freq: Once | INTRAMUSCULAR | Status: AC
  Administered 2016-05-23: 8 mg via INTRAVENOUS

## 2016-05-23 MED ORDER — ALTEPLASE 2 MG IJ SOLR
2.0000 mg | Freq: Once | INTRAMUSCULAR | Status: DC | PRN
  Filled 2016-05-23: qty 2

## 2016-05-23 MED ORDER — HEPARIN SOD (PORK) LOCK FLUSH 100 UNIT/ML IV SOLN
500.0000 [IU] | Freq: Once | INTRAVENOUS | Status: AC | PRN
  Administered 2016-05-23: 500 [IU]
  Filled 2016-05-23: qty 5

## 2016-05-23 MED ORDER — HEPARIN SOD (PORK) LOCK FLUSH 100 UNIT/ML IV SOLN
250.0000 [IU] | Freq: Once | INTRAVENOUS | Status: DC | PRN
  Filled 2016-05-23: qty 5

## 2016-05-23 NOTE — Patient Instructions (Signed)
Dehydration, Adult Dehydration is when there is not enough fluid or water in your body. This happens when you lose more fluids than you take in. Dehydration can range from mild to very bad. It should be treated right away to keep it from getting very bad. Symptoms of mild dehydration may include:   Thirst.  Dry lips.  Slightly dry mouth.  Dry, warm skin.  Dizziness. Symptoms of moderate dehydration may include:   Very dry mouth.  Muscle cramps.  Dark pee (urine). Pee may be the color of tea.  Your body making less pee.  Your eyes making fewer tears.  Heartbeat that is uneven or faster than normal (palpitations).  Headache.  Light-headedness, especially when you stand up from sitting.  Fainting (syncope). Symptoms of very bad dehydration may include:   Changes in skin, such as:  Cold and clammy skin.  Blotchy (mottled) or pale skin.  Skin that does not quickly return to normal after being lightly pinched and let go (poor skin turgor).  Changes in body fluids, such as:  Feeling very thirsty.  Your eyes making fewer tears.  Not sweating when body temperature is high, such as in hot weather.  Your body making very little pee.  Changes in vital signs, such as:  Weak pulse.  Pulse that is more than 100 beats a minute when you are sitting still.  Fast breathing.  Low blood pressure.  Other changes, such as:  Sunken eyes.  Cold hands and feet.  Confusion.  Lack of energy (lethargy).  Trouble waking up from sleep.  Short-term weight loss.  Unconsciousness. Follow these instructions at home:  If told by your doctor, drink an ORS:  Make an ORS by using instructions on the package.  Start by drinking small amounts, about  cup (120 mL) every 5-10 minutes.  Slowly drink more until you have had the amount that your doctor said to have.  Drink enough clear fluid to keep your pee clear or pale yellow. If you were told to drink an ORS, finish the  ORS first, then start slowly drinking clear fluids. Drink fluids such as:  Water. Do not drink only water by itself. Doing that can make the salt (sodium) level in your body get too low (hyponatremia).  Ice chips.  Fruit juice that you have added water to (diluted).  Low-calorie sports drinks.  Avoid:  Alcohol.  Drinks that have a lot of sugar. These include high-calorie sports drinks, fruit juice that does not have water added, and soda.  Caffeine.  Foods that are greasy or have a lot of fat or sugar.  Take over-the-counter and prescription medicines only as told by your doctor.  Do not take salt tablets. Doing that can make the salt level in your body get too high (hypernatremia).  Eat foods that have minerals (electrolytes). Examples include bananas, oranges, potatoes, tomatoes, and spinach.  Keep all follow-up visits as told by your doctor. This is important. Contact a doctor if:  You have belly (abdominal) pain that:  Gets worse.  Stays in one area (localizes).  You have a rash.  You have a stiff neck.  You get angry or annoyed more easily than normal (irritability).  You are more sleepy than normal.  You have a harder time waking up than normal.  You feel:  Weak.  Dizzy.  Very thirsty.  You have peed (urinated) only a small amount of very dark pee during 6-8 hours. Get help right away if:  You   have symptoms of very bad dehydration.  You cannot drink fluids without throwing up (vomiting).  Your symptoms get worse with treatment.  You have a fever.  You have a very bad headache.  You are throwing up or having watery poop (diarrhea) and it:  Gets worse.  Does not go away.  You have blood or something green (bile) in your throw-up.  You have blood in your poop (stool). This may cause poop to look black and tarry.  You have not peed in 6-8 hours.  You pass out (faint).  Your heart rate when you are sitting still is more than 100 beats a  minute.  You have trouble breathing. This information is not intended to replace advice given to you by your health care provider. Make sure you discuss any questions you have with your health care provider. Document Released: 10/14/2008 Document Revised: 07/08/2015 Document Reviewed: 02/11/2015 Elsevier Interactive Patient Education  2017 [employer]  

## 2016-05-23 NOTE — Telephone Encounter (Signed)
05/23/16 Called and left. msg. for pt. to call Dental Medicine and schedule S/P XRT CK w/Dr. Enrique Sack.  LRI

## 2016-05-24 ENCOUNTER — Telehealth: Payer: Self-pay | Admitting: Hematology and Oncology

## 2016-05-24 ENCOUNTER — Ambulatory Visit (HOSPITAL_BASED_OUTPATIENT_CLINIC_OR_DEPARTMENT_OTHER): Payer: 59

## 2016-05-24 VITALS — BP 121/60 | HR 82 | Temp 99.0°F | Resp 18 | Ht 67.0 in | Wt 189.9 lb

## 2016-05-24 DIAGNOSIS — C023 Malignant neoplasm of anterior two-thirds of tongue, part unspecified: Secondary | ICD-10-CM

## 2016-05-24 DIAGNOSIS — R42 Dizziness and giddiness: Secondary | ICD-10-CM

## 2016-05-24 MED ORDER — ONDANSETRON HCL 4 MG/2ML IJ SOLN
INTRAMUSCULAR | Status: AC
  Filled 2016-05-24: qty 2

## 2016-05-24 MED ORDER — HEPARIN SOD (PORK) LOCK FLUSH 100 UNIT/ML IV SOLN
500.0000 [IU] | Freq: Once | INTRAVENOUS | Status: AC | PRN
  Administered 2016-05-24: 500 [IU]
  Filled 2016-05-24: qty 5

## 2016-05-24 MED ORDER — ONDANSETRON HCL 4 MG/2ML IJ SOLN
8.0000 mg | Freq: Once | INTRAMUSCULAR | Status: AC
  Administered 2016-05-24: 8 mg via INTRAVENOUS

## 2016-05-24 MED ORDER — SODIUM CHLORIDE 0.9% FLUSH
10.0000 mL | INTRAVENOUS | Status: DC | PRN
  Administered 2016-05-24: 10 mL
  Filled 2016-05-24: qty 10

## 2016-05-24 MED ORDER — SODIUM CHLORIDE 0.9 % IV SOLN
Freq: Once | INTRAVENOUS | Status: AC
  Administered 2016-05-24: 12:00:00 via INTRAVENOUS

## 2016-05-24 NOTE — Telephone Encounter (Signed)
Tammi, Can you call her? That did not make sense

## 2016-05-24 NOTE — Telephone Encounter (Signed)
Patient called to cancel her appointment today in [facility] for an infusion because she is dizzy

## 2016-05-24 NOTE — Telephone Encounter (Signed)
Pt states she has cancelled appt for IVF because she was dizzy when she stands up. Encouraged to come in for fluids. Will come in today

## 2016-05-24 NOTE — Patient Instructions (Signed)
Dehydration, Adult Dehydration is when there is not enough fluid or water in your body. This happens when you lose more fluids than you take in. Dehydration can range from mild to very bad. It should be treated right away to keep it from getting very bad. Symptoms of mild dehydration may include:   Thirst.  Dry lips.  Slightly dry mouth.  Dry, warm skin.  Dizziness. Symptoms of moderate dehydration may include:   Very dry mouth.  Muscle cramps.  Dark pee (urine). Pee may be the color of tea.  Your body making less pee.  Your eyes making fewer tears.  Heartbeat that is uneven or faster than normal (palpitations).  Headache.  Light-headedness, especially when you stand up from sitting.  Fainting (syncope). Symptoms of very bad dehydration may include:   Changes in skin, such as:  Cold and clammy skin.  Blotchy (mottled) or pale skin.  Skin that does not quickly return to normal after being lightly pinched and let go (poor skin turgor).  Changes in body fluids, such as:  Feeling very thirsty.  Your eyes making fewer tears.  Not sweating when body temperature is high, such as in hot weather.  Your body making very little pee.  Changes in vital signs, such as:  Weak pulse.  Pulse that is more than 100 beats a minute when you are sitting still.  Fast breathing.  Low blood pressure.  Other changes, such as:  Sunken eyes.  Cold hands and feet.  Confusion.  Lack of energy (lethargy).  Trouble waking up from sleep.  Short-term weight loss.  Unconsciousness. Follow these instructions at home:  If told by your doctor, drink an ORS:  Make an ORS by using instructions on the package.  Start by drinking small amounts, about  cup (120 mL) every 5-10 minutes.  Slowly drink more until you have had the amount that your doctor said to have.  Drink enough clear fluid to keep your pee clear or pale yellow. If you were told to drink an ORS, finish the  ORS first, then start slowly drinking clear fluids. Drink fluids such as:  Water. Do not drink only water by itself. Doing that can make the salt (sodium) level in your body get too low (hyponatremia).  Ice chips.  Fruit juice that you have added water to (diluted).  Low-calorie sports drinks.  Avoid:  Alcohol.  Drinks that have a lot of sugar. These include high-calorie sports drinks, fruit juice that does not have water added, and soda.  Caffeine.  Foods that are greasy or have a lot of fat or sugar.  Take over-the-counter and prescription medicines only as told by your doctor.  Do not take salt tablets. Doing that can make the salt level in your body get too high (hypernatremia).  Eat foods that have minerals (electrolytes). Examples include bananas, oranges, potatoes, tomatoes, and spinach.  Keep all follow-up visits as told by your doctor. This is important. Contact a doctor if:  You have belly (abdominal) pain that:  Gets worse.  Stays in one area (localizes).  You have a rash.  You have a stiff neck.  You get angry or annoyed more easily than normal (irritability).  You are more sleepy than normal.  You have a harder time waking up than normal.  You feel:  Weak.  Dizzy.  Very thirsty.  You have peed (urinated) only a small amount of very dark pee during 6-8 hours. Get help right away if:  You   have symptoms of very bad dehydration.  You cannot drink fluids without throwing up (vomiting).  Your symptoms get worse with treatment.  You have a fever.  You have a very bad headache.  You are throwing up or having watery poop (diarrhea) and it:  Gets worse.  Does not go away.  You have blood or something green (bile) in your throw-up.  You have blood in your poop (stool). This may cause poop to look black and tarry.  You have not peed in 6-8 hours.  You pass out (faint).  Your heart rate when you are sitting still is more than 100 beats a  minute.  You have trouble breathing. This information is not intended to replace advice given to you by your health care provider. Make sure you discuss any questions you have with your health care provider. Document Released: 10/14/2008 Document Revised: 07/08/2015 Document Reviewed: 02/11/2015 Elsevier Interactive Patient Education  2017 [employer]  

## 2016-05-25 ENCOUNTER — Ambulatory Visit (HOSPITAL_BASED_OUTPATIENT_CLINIC_OR_DEPARTMENT_OTHER): Payer: 59

## 2016-05-25 VITALS — BP 126/72 | HR 83 | Temp 99.4°F | Resp 18 | Ht 67.0 in | Wt 191.7 lb

## 2016-05-25 DIAGNOSIS — C023 Malignant neoplasm of anterior two-thirds of tongue, part unspecified: Secondary | ICD-10-CM

## 2016-05-25 DIAGNOSIS — E860 Dehydration: Secondary | ICD-10-CM | POA: Diagnosis not present

## 2016-05-25 MED ORDER — ONDANSETRON HCL 4 MG/2ML IJ SOLN
INTRAMUSCULAR | Status: AC
Start: 2016-05-25 — End: 2016-05-25
  Filled 2016-05-25: qty 4

## 2016-05-25 MED ORDER — SODIUM CHLORIDE 0.9 % IV SOLN
Freq: Once | INTRAVENOUS | Status: AC
  Administered 2016-05-25: 11:00:00 via INTRAVENOUS

## 2016-05-25 MED ORDER — HEPARIN SOD (PORK) LOCK FLUSH 100 UNIT/ML IV SOLN
500.0000 [IU] | Freq: Once | INTRAVENOUS | Status: AC | PRN
  Administered 2016-05-25: 500 [IU]
  Filled 2016-05-25: qty 5

## 2016-05-25 MED ORDER — SODIUM CHLORIDE 0.9% FLUSH
10.0000 mL | INTRAVENOUS | Status: DC | PRN
  Administered 2016-05-25: 10 mL
  Filled 2016-05-25: qty 10

## 2016-05-25 MED ORDER — ONDANSETRON HCL 4 MG/2ML IJ SOLN
8.0000 mg | Freq: Once | INTRAMUSCULAR | Status: AC
  Administered 2016-05-25: 8 mg via INTRAVENOUS

## 2016-05-25 NOTE — Patient Instructions (Signed)
Dehydration, Adult Dehydration is when there is not enough fluid or water in your body. This happens when you lose more fluids than you take in. Dehydration can range from mild to very bad. It should be treated right away to keep it from getting very bad. Symptoms of mild dehydration may include:   Thirst.  Dry lips.  Slightly dry mouth.  Dry, warm skin.  Dizziness. Symptoms of moderate dehydration may include:   Very dry mouth.  Muscle cramps.  Dark pee (urine). Pee may be the color of tea.  Your body making less pee.  Your eyes making fewer tears.  Heartbeat that is uneven or faster than normal (palpitations).  Headache.  Light-headedness, especially when you stand up from sitting.  Fainting (syncope). Symptoms of very bad dehydration may include:   Changes in skin, such as:  Cold and clammy skin.  Blotchy (mottled) or pale skin.  Skin that does not quickly return to normal after being lightly pinched and let go (poor skin turgor).  Changes in body fluids, such as:  Feeling very thirsty.  Your eyes making fewer tears.  Not sweating when body temperature is high, such as in hot weather.  Your body making very little pee.  Changes in vital signs, such as:  Weak pulse.  Pulse that is more than 100 beats a minute when you are sitting still.  Fast breathing.  Low blood pressure.  Other changes, such as:  Sunken eyes.  Cold hands and feet.  Confusion.  Lack of energy (lethargy).  Trouble waking up from sleep.  Short-term weight loss.  Unconsciousness. Follow these instructions at home:  If told by your doctor, drink an ORS:  Make an ORS by using instructions on the package.  Start by drinking small amounts, about  cup (120 mL) every 5-10 minutes.  Slowly drink more until you have had the amount that your doctor said to have.  Drink enough clear fluid to keep your pee clear or pale yellow. If you were told to drink an ORS, finish the  ORS first, then start slowly drinking clear fluids. Drink fluids such as:  Water. Do not drink only water by itself. Doing that can make the salt (sodium) level in your body get too low (hyponatremia).  Ice chips.  Fruit juice that you have added water to (diluted).  Low-calorie sports drinks.  Avoid:  Alcohol.  Drinks that have a lot of sugar. These include high-calorie sports drinks, fruit juice that does not have water added, and soda.  Caffeine.  Foods that are greasy or have a lot of fat or sugar.  Take over-the-counter and prescription medicines only as told by your doctor.  Do not take salt tablets. Doing that can make the salt level in your body get too high (hypernatremia).  Eat foods that have minerals (electrolytes). Examples include bananas, oranges, potatoes, tomatoes, and spinach.  Keep all follow-up visits as told by your doctor. This is important. Contact a doctor if:  You have belly (abdominal) pain that:  Gets worse.  Stays in one area (localizes).  You have a rash.  You have a stiff neck.  You get angry or annoyed more easily than normal (irritability).  You are more sleepy than normal.  You have a harder time waking up than normal.  You feel:  Weak.  Dizzy.  Very thirsty.  You have peed (urinated) only a small amount of very dark pee during 6-8 hours. Get help right away if:  You   have symptoms of very bad dehydration.  You cannot drink fluids without throwing up (vomiting).  Your symptoms get worse with treatment.  You have a fever.  You have a very bad headache.  You are throwing up or having watery poop (diarrhea) and it:  Gets worse.  Does not go away.  You have blood or something green (bile) in your throw-up.  You have blood in your poop (stool). This may cause poop to look black and tarry.  You have not peed in 6-8 hours.  You pass out (faint).  Your heart rate when you are sitting still is more than 100 beats a  minute.  You have trouble breathing. This information is not intended to replace advice given to you by your health care provider. Make sure you discuss any questions you have with your health care provider. Document Released: 10/14/2008 Document Revised: 07/08/2015 Document Reviewed: 02/11/2015 Elsevier Interactive Patient Education  2017 [employer]  

## 2016-05-29 ENCOUNTER — Other Ambulatory Visit: Payer: Self-pay | Admitting: Hematology and Oncology

## 2016-05-29 ENCOUNTER — Ambulatory Visit (HOSPITAL_COMMUNITY)
Admission: RE | Admit: 2016-05-29 | Discharge: 2016-05-29 | Disposition: A | Discharge status: Home / Self Care | Payer: 59 | Source: Ambulatory Visit | Attending: Hematology and Oncology | Admitting: Hematology and Oncology

## 2016-05-29 ENCOUNTER — Ambulatory Visit (HOSPITAL_BASED_OUTPATIENT_CLINIC_OR_DEPARTMENT_OTHER): Payer: 59

## 2016-05-29 ENCOUNTER — Encounter: Payer: 59 | Admitting: Hematology and Oncology

## 2016-05-29 ENCOUNTER — Inpatient Hospital Stay (HOSPITAL_COMMUNITY)
Admission: AD | Admit: 2016-05-29 | Discharge: 2016-06-04 | DRG: 641 | Disposition: A | Discharge status: Home / Self Care | Payer: 59 | Source: Ambulatory Visit | Attending: Hematology and Oncology | Admitting: Hematology and Oncology

## 2016-05-29 VITALS — BP 137/65 | HR 86 | Temp 98.4°F | Resp 20 | Ht 67.0 in | Wt 184.4 lb

## 2016-05-29 DIAGNOSIS — D61818 Other pancytopenia: Secondary | ICD-10-CM | POA: Diagnosis not present

## 2016-05-29 DIAGNOSIS — G893 Neoplasm related pain (acute) (chronic): Secondary | ICD-10-CM

## 2016-05-29 DIAGNOSIS — Z9221 Personal history of antineoplastic chemotherapy: Secondary | ICD-10-CM

## 2016-05-29 DIAGNOSIS — C023 Malignant neoplasm of anterior two-thirds of tongue, part unspecified: Secondary | ICD-10-CM | POA: Diagnosis present

## 2016-05-29 DIAGNOSIS — R634 Abnormal weight loss: Secondary | ICD-10-CM | POA: Diagnosis not present

## 2016-05-29 DIAGNOSIS — Z6828 Body mass index (BMI) 28.0-28.9, adult: Secondary | ICD-10-CM

## 2016-05-29 DIAGNOSIS — E860 Dehydration: Secondary | ICD-10-CM | POA: Diagnosis not present

## 2016-05-29 DIAGNOSIS — R112 Nausea with vomiting, unspecified: Secondary | ICD-10-CM | POA: Diagnosis present

## 2016-05-29 DIAGNOSIS — E440 Moderate protein-calorie malnutrition: Secondary | ICD-10-CM | POA: Diagnosis present

## 2016-05-29 DIAGNOSIS — C029 Malignant neoplasm of tongue, unspecified: Secondary | ICD-10-CM | POA: Diagnosis present

## 2016-05-29 DIAGNOSIS — K5909 Other constipation: Secondary | ICD-10-CM

## 2016-05-29 DIAGNOSIS — Z931 Gastrostomy status: Secondary | ICD-10-CM

## 2016-05-29 DIAGNOSIS — I10 Essential (primary) hypertension: Secondary | ICD-10-CM | POA: Diagnosis present

## 2016-05-29 DIAGNOSIS — E785 Hyperlipidemia, unspecified: Secondary | ICD-10-CM | POA: Diagnosis present

## 2016-05-29 DIAGNOSIS — K117 Disturbances of salivary secretion: Secondary | ICD-10-CM | POA: Diagnosis not present

## 2016-05-29 DIAGNOSIS — R110 Nausea: Secondary | ICD-10-CM | POA: Diagnosis present

## 2016-05-29 DIAGNOSIS — T451X5A Adverse effect of antineoplastic and immunosuppressive drugs, initial encounter: Secondary | ICD-10-CM

## 2016-05-29 DIAGNOSIS — R42 Dizziness and giddiness: Secondary | ICD-10-CM | POA: Diagnosis not present

## 2016-05-29 DIAGNOSIS — E039 Hypothyroidism, unspecified: Secondary | ICD-10-CM | POA: Diagnosis present

## 2016-05-29 DIAGNOSIS — E876 Hypokalemia: Principal | ICD-10-CM | POA: Diagnosis present

## 2016-05-29 DIAGNOSIS — F329 Major depressive disorder, single episode, unspecified: Secondary | ICD-10-CM | POA: Diagnosis present

## 2016-05-29 DIAGNOSIS — R1310 Dysphagia, unspecified: Secondary | ICD-10-CM | POA: Diagnosis present

## 2016-05-29 DIAGNOSIS — F419 Anxiety disorder, unspecified: Secondary | ICD-10-CM | POA: Diagnosis present

## 2016-05-29 DIAGNOSIS — Z8249 Family history of ischemic heart disease and other diseases of the circulatory system: Secondary | ICD-10-CM

## 2016-05-29 DIAGNOSIS — K5900 Constipation, unspecified: Secondary | ICD-10-CM | POA: Diagnosis present

## 2016-05-29 DIAGNOSIS — Z923 Personal history of irradiation: Secondary | ICD-10-CM

## 2016-05-29 LAB — GLUCOSE, CAPILLARY
Glucose-Capillary: 84 mg/dL (ref 65–99)
Glucose-Capillary: 98 mg/dL (ref 65–99)

## 2016-05-29 LAB — COMPREHENSIVE METABOLIC PANEL
ALBUMIN: 3.2 g/dL — AB (ref 3.5–5.0)
ALK PHOS: 73 U/L (ref 40–150)
ALT: 11 U/L (ref 0–55)
ANION GAP: 14 meq/L — AB (ref 3–11)
AST: 22 U/L (ref 5–34)
BILIRUBIN TOTAL: 1.11 mg/dL (ref 0.20–1.20)
BUN: 10.4 mg/dL (ref 7.0–26.0)
CO2: 28 meq/L (ref 22–29)
Calcium: 8.6 mg/dL (ref 8.4–10.4)
Chloride: 100 mEq/L (ref 98–109)
Creatinine: 0.9 mg/dL (ref 0.6–1.1)
EGFR: 75 mL/min/{1.73_m2} — AB (ref >90)
GLUCOSE: 85 mg/dL (ref 70–140)
Potassium: 2.9 mEq/L — CL (ref 3.5–5.1)
SODIUM: 142 meq/L (ref 136–145)
Total Protein: 6.6 g/dL (ref 6.4–8.3)

## 2016-05-29 LAB — CBC WITH DIFFERENTIAL/PLATELET
BASO%: 0.5 % (ref 0.0–2.0)
BASOS ABS: 0 10*3/uL (ref 0.0–0.1)
EOS%: 0.6 % (ref 0.0–7.0)
Eosinophils Absolute: 0 10*3/uL (ref 0.0–0.5)
HEMATOCRIT: 30.2 % — AB (ref 34.8–46.6)
HEMOGLOBIN: 10.6 g/dL — AB (ref 11.6–15.9)
LYMPH#: 0.3 10*3/uL — AB (ref 0.9–3.3)
LYMPH%: 7.4 % — ABNORMAL LOW (ref 14.0–49.7)
MCH: 30.2 pg (ref 25.1–34.0)
MCHC: 35.2 g/dL (ref 31.5–36.0)
MCV: 85.7 fL (ref 79.5–101.0)
MONO#: 0.4 10*3/uL (ref 0.1–0.9)
MONO%: 12.4 % (ref 0.0–14.0)
NEUT%: 79.1 % — ABNORMAL HIGH (ref 38.4–76.8)
NEUTROS ABS: 2.8 10*3/uL (ref 1.5–6.5)
Platelets: 150 10*3/uL (ref 145–400)
RBC: 3.52 10*6/uL — ABNORMAL LOW (ref 3.70–5.45)
RDW: 20 % — AB (ref 11.2–14.5)
WBC: 3.6 10*3/uL — AB (ref 3.9–10.3)

## 2016-05-29 LAB — MAGNESIUM: Magnesium: 1.3 mg/dl — CL (ref 1.5–2.5)

## 2016-05-29 MED ORDER — MAGNESIUM SULFATE 4 GM/100ML IV SOLN
4.0000 g | Freq: Once | INTRAVENOUS | Status: AC
  Administered 2016-05-29: 4 g via INTRAVENOUS
  Filled 2016-05-29: qty 100

## 2016-05-29 MED ORDER — BUPROPION HCL ER (SR) 150 MG PO TB12
150.0000 mg | ORAL_TABLET | Freq: Every day | ORAL | Status: DC
  Administered 2016-05-29 – 2016-06-04 (×2): 150 mg via ORAL
  Filled 2016-05-29 (×6): qty 1

## 2016-05-29 MED ORDER — LIDOCAINE-PRILOCAINE 2.5-2.5 % EX CREA: TOPICAL_CREAM | Freq: Once | CUTANEOUS | Status: DC

## 2016-05-29 MED ORDER — MAGNESIUM CITRATE PO SOLN: 1.0000 | Freq: Once | ORAL | Status: DC | PRN

## 2016-05-29 MED ORDER — SENNOSIDES-DOCUSATE SODIUM 8.6-50 MG PO TABS: 1.0000 | ORAL_TABLET | Freq: Every evening | ORAL | Status: DC | PRN

## 2016-05-29 MED ORDER — ADULT MULTIVITAMIN LIQUID CH
15.0000 mL | Freq: Every day | ORAL | Status: DC
  Administered 2016-05-29 – 2016-06-04 (×7): 15 mL
  Filled 2016-05-29 (×7): qty 15

## 2016-05-29 MED ORDER — ONDANSETRON HCL 4 MG PO TABS: 4.0000 mg | ORAL_TABLET | Freq: Three times a day (TID) | ORAL | Status: DC | PRN

## 2016-05-29 MED ORDER — OSMOLITE 1.5 CAL PO LIQD
1000.0000 mL | ORAL | Status: DC
  Administered 2016-05-29: 1000 mL
  Filled 2016-05-29 (×2): qty 1000

## 2016-05-29 MED ORDER — HYDROMORPHONE HCL 4 MG/ML IJ SOLN: 1.0000 mg | INTRAMUSCULAR | Status: DC | PRN

## 2016-05-29 MED ORDER — ONDANSETRON 4 MG PO TBDP: 4.0000 mg | ORAL_TABLET | Freq: Three times a day (TID) | ORAL | Status: DC | PRN

## 2016-05-29 MED ORDER — ONDANSETRON HCL 4 MG/2ML IJ SOLN
8.0000 mg | Freq: Once | INTRAMUSCULAR | Status: AC
  Administered 2016-05-29: 8 mg via INTRAVENOUS

## 2016-05-29 MED ORDER — HEPARIN SOD (PORK) LOCK FLUSH 100 UNIT/ML IV SOLN
500.0000 [IU] | Freq: Once | INTRAVENOUS | Status: DC | PRN
  Filled 2016-05-29: qty 5

## 2016-05-29 MED ORDER — MORPHINE SULFATE (CONCENTRATE) 10 MG/0.5ML PO SOLN
20.0000 mg | ORAL | Status: DC | PRN
Start: 2016-05-29 — End: 2016-06-04
  Filled 2016-05-29: qty 1

## 2016-05-29 MED ORDER — ORAL CARE MOUTH RINSE
15.0000 mL | Freq: Two times a day (BID) | OROMUCOSAL | Status: DC
  Administered 2016-05-30 – 2016-06-04 (×11): 15 mL via OROMUCOSAL

## 2016-05-29 MED ORDER — JEVITY 1.2 CAL PO LIQD: 1000.0000 mL | ORAL | Status: DC

## 2016-05-29 MED ORDER — SODIUM CHLORIDE 0.9 % IV SOLN
8.0000 mg | Freq: Three times a day (TID) | INTRAVENOUS | Status: DC | PRN
  Filled 2016-05-29: qty 4

## 2016-05-29 MED ORDER — SODIUM CHLORIDE 0.9% FLUSH
10.0000 mL | INTRAVENOUS | Status: DC | PRN
  Filled 2016-05-29: qty 10

## 2016-05-29 MED ORDER — ONDANSETRON HCL 4 MG/2ML IJ SOLN
4.0000 mg | Freq: Three times a day (TID) | INTRAMUSCULAR | Status: DC | PRN
  Administered 2016-05-30 – 2016-06-04 (×9): 4 mg via INTRAVENOUS
  Filled 2016-05-29 (×8): qty 2

## 2016-05-29 MED ORDER — ACETAMINOPHEN 325 MG PO TABS: 650.0000 mg | ORAL_TABLET | ORAL | Status: DC | PRN

## 2016-05-29 MED ORDER — SCOPOLAMINE 1 MG/3DAYS TD PT72
1.0000 | MEDICATED_PATCH | TRANSDERMAL | Status: DC
  Administered 2016-05-31 – 2016-06-03 (×2): 1.5 mg via TRANSDERMAL
  Filled 2016-05-29 (×2): qty 1

## 2016-05-29 MED ORDER — ENOXAPARIN SODIUM 40 MG/0.4ML ~~LOC~~ SOLN
40.0000 mg | SUBCUTANEOUS | Status: DC
  Administered 2016-05-29 – 2016-06-03 (×6): 40 mg via SUBCUTANEOUS
  Filled 2016-05-29 (×6): qty 0.4

## 2016-05-29 MED ORDER — SODIUM CHLORIDE 0.9 % IV SOLN
Freq: Once | INTRAVENOUS | Status: AC
  Administered 2016-05-29: 12:00:00 via INTRAVENOUS

## 2016-05-29 MED ORDER — FENTANYL 75 MCG/HR TD PT72
75.0000 ug | MEDICATED_PATCH | TRANSDERMAL | Status: DC
  Administered 2016-05-29: 75 ug via TRANSDERMAL
  Filled 2016-05-29: qty 1

## 2016-05-29 MED ORDER — GLYCERIN (LAXATIVE) 2.1 G RE SUPP
1.0000 | Freq: Once | RECTAL | Status: AC
Start: 2016-05-29 — End: 2016-05-29
  Administered 2016-05-29: 1 via RECTAL
  Filled 2016-05-29 (×2): qty 1

## 2016-05-29 MED ORDER — LEVOTHYROXINE SODIUM 25 MCG PO TABS
75.0000 ug | ORAL_TABLET | Freq: Every day | ORAL | Status: DC
  Administered 2016-05-30 – 2016-06-04 (×6): 75 ug via ORAL
  Filled 2016-05-29 (×6): qty 1

## 2016-05-29 MED ORDER — ALPRAZOLAM 0.5 MG PO TABS
0.5000 mg | ORAL_TABLET | Freq: Every evening | ORAL | Status: DC | PRN
  Administered 2016-06-02 – 2016-06-03 (×2): 0.5 mg via ORAL
  Filled 2016-05-29 (×2): qty 1

## 2016-05-29 MED ORDER — SENNA 8.6 MG PO TABS
1.0000 | ORAL_TABLET | Freq: Two times a day (BID) | ORAL | Status: DC
  Administered 2016-05-29 – 2016-06-01 (×6): 8.6 mg via ORAL
  Filled 2016-05-29 (×11): qty 1

## 2016-05-29 MED ORDER — LORAZEPAM 1 MG PO TABS
1.0000 mg | ORAL_TABLET | Freq: Three times a day (TID) | ORAL | Status: DC | PRN
  Administered 2016-05-30 – 2016-06-03 (×4): 1 mg via ORAL
  Filled 2016-05-29 (×4): qty 1

## 2016-05-29 MED ORDER — POTASSIUM CHLORIDE IN NACL 20-0.9 MEQ/L-% IV SOLN
INTRAVENOUS | Status: DC
  Administered 2016-05-29 – 2016-06-03 (×6): via INTRAVENOUS
  Administered 2016-06-04: 1000 mL via INTRAVENOUS
  Filled 2016-05-29 (×11): qty 1000

## 2016-05-29 MED ORDER — ONDANSETRON HCL 4 MG/2ML IJ SOLN
INTRAMUSCULAR | Status: AC
  Filled 2016-05-29: qty 4

## 2016-05-29 MED ORDER — CHLORHEXIDINE GLUCONATE 0.12 % MT SOLN
15.0000 mL | Freq: Two times a day (BID) | OROMUCOSAL | Status: DC
  Administered 2016-05-29 – 2016-06-04 (×11): 15 mL via OROMUCOSAL
  Filled 2016-05-29 (×11): qty 15

## 2016-05-29 NOTE — Progress Notes (Signed)
Initial Nutrition Assessment  DOCUMENTATION CODES:   Severe malnutrition in context of chronic illness  INTERVENTION:   Monitor magnesium, potassium, and phosphorus daily for at least 3 days, MD to replete as needed, as pt is at risk for refeeding syndrome given severe malnutrition, inadequate PO and TF infusion over the past month, low K and Mg levels.  Initiate trickle feeds of Osmolite 1.5 @ 15 ml/hr.  Once able to tolerate feeds, will advance every 12 hours to goal rate of 65 ml/hr. 30 ml Prostat BID. Goal rate provides 2540 kcal (100% of needs),  127g protein (101% of needs), and 1188 ml H2O.   Will provide liquid MVI per tube.  RD to continue to monitor  NUTRITION DIAGNOSIS:   Malnutrition related to chronic illness, cancer and cancer related treatments as evidenced by percent weight loss, energy intake < or equal to 75% for > or equal to 1 month.  GOAL:   Patient will meet greater than or equal to 90% of their needs  MONITOR:   Labs, Weight trends, TF tolerance, I & O's  REASON FOR ASSESSMENT:   Consult Enteral/tube feeding initiation and management  ASSESSMENT:   61 y.o. female is admitted for management of above symptoms: Severe dehydration, dizziness, uncontrolled nausea and vomiting.  Patient in room with daughter at bedside. Patient reports she has been unable to tolerate any tube feedings for 3 days d/t N/V. She is using a pump at home and receiving continuous feeds via PEG.  Prior to these symptoms pt was only able to administer 2 cans of Osmolite 1.5 a day at 60 ml/hr. Pt was not able to infuse much water via PEG. Pt is followed by [place] RD since before PEG placement. Pt was struggling to meet estimated needs over the past month. Per [place] RD notes, the most the patient has been able to tolerate is 4 cans of Ensure Plus daily, which provided 1400 kcal (61% of needs)  and 52g protein (41% of needs).   Goal for tube feedings is to transition to  night feeds: Osmolite 1.5 @ 120 ml/hr x 14 hours (6pm-8am). Free water flushes 120 ml before and after continuous feedings. Will need additional free water of 240 ml QID. Provides 2520 kcal (100% of needs), 105g protein (84% of needs) and 2480 ml H2O.  Per chart review, pt has lost 20 lb since 4/26 (10% wt loss x 1 month, significant for time frame). Nutrition focused physical exam shows no sign of depletion of muscle mass or body fat.  Medications: Senokot BID, IV Mg sulfate once  Labs reviewed: Low K, Mg  Diet Order:  Diet regular Room service appropriate? Yes; Fluid consistency: Thin  Skin:  Reviewed, no issues  Last BM:  PTA  Height:   Ht Readings from Last 1 Encounters:  05/29/16 5\' 7"  (1.702 m)    Weight:   Wt Readings from Last 1 Encounters:  05/29/16 184 lb (83.5 kg)    Ideal Body Weight:  61.4 kg  BMI:  Body mass index is 28.82 kg/m.  Estimated Nutritional Needs:   Kcal:  2229-7989  Protein:  125-135g  Fluid:  2.5L/day  EDUCATION NEEDS:   Education needs addressed  Clayton Bibles, MS, RD, LDN Pager: [phone] After Hours Pager: [phone]

## 2016-05-29 NOTE — Progress Notes (Signed)
This encounter was created in error - please disregard.

## 2016-05-29 NOTE — Progress Notes (Signed)
Pt very tearful upon arrival to [facility].  This is very unusual for patient.  She states she has had an increase in her thick sticky secretions, increased nausea and vomiting, unable to tolerate anythingby mouth or even via her PEG tube.Marland Kitchen  "Everything just comes back up".. Pt has lost 7 lbs in 4 days. Pt states she feels dizzy and just generally bad.  Pt has not had labs in 3.5 weeks. Made Dr. Alvy Bimler aware of the above. Labs ordered.  Pt to be admitted to [facility]  # [id number] after going to Radiology for CXR and abd xray.  Labs obtained from port and then flushed.  Left accessed with biopatch and sorbaview dressing.  Pt's daughter present.  Transported to Radiology via w/c per Darden Dates, RN and then radiology to take pt to her room on [address].

## 2016-05-29 NOTE — H&P (Signed)
[place] NOTE  Patient Care Team: Bernerd Limbo, MD as PCP - General (Family Medicine) Jerrell Belfast, MD as Consulting Physician (Otolaryngology) Eppie Gibson, MD as Attending Physician (Radiation Oncology) [name], MD as Consulting Physician (Hematology and Oncology) Leota Sauers, RN as Oncology Nurse Navigator (Oncology)  CHIEF COMPLAINTS/PURPOSE OF ADMISSION Severe dehydration, dizziness, uncontrolled nausea and vomiting  HISTORY OF PRESENTING ILLNESS:  Tiffany Yu 61 y.o. 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 Tongue, biopsy, Anterior deep margin left SQUAMOUS CELL CARCINOMA 3. Tongue, biopsy, Posterior deep margin left NEGATIVE FOR CARCINOMA 4. Tongue, biopsy, Posterior margin left SQUAMOUS CELL CARCINOMA 5. Tongue, excisional biopsy, New anterior deep margin NEGATIVE FOR CARCINOMA 6. Tongue, excisional biopsy, New posterior deep margin SMALL FOCUS OF SQUAMOUS CELL CARCINOMA (0.1 CM, ONLY PRESENT AT THE DEEPER PERMINENT SECTION) 7. Tongue, resection for tumor, Left lateral INFILTRATIVE KERATINIZED SQUAMOUS CELL CARCINOMA (3.2 CM) THE TUMOR INVADES UNDERNEATH MUSCLE OF THE TONGUE (2.0 CM, PT2) LYMPHOVASCULAR AND PERINEURAL INVASION IDENTIFIED SQUAMOUS CELL CARCINOMA PRESENTED AT (FINAL MARGINS REFER TO PART 2-6) LATERAL INKED MARGIN (1.0 CM) MEDIAL INKED MARGIN (0.6 CM)      02/08/2016 Surgery    SURGICAL PROCEDURES: 1. Left hemiglossectomy with primary closure. 2. Left selective neck dissection (zones I-III).       03/02/2016 Imaging    CT chest with contrast: No evidence of metastatic disease in the chest. 2. Aortic atherosclerosis.       03/02/2016 Imaging    CT neck with contrast showed : Newly enlarged and round 10 mm right level 1b lymph node (series 5, image 45). The level 1A nodes remain normal. This right 1 B node is nonspecific but suspicious for progressive nodal metastatic disease. It might be amenable to Ultrasound-guided FNA. 2. Interval left hemiglossectomy and selective left neck dissection with confluent indeterminate soft tissue from the anterior left submandibular space through the left carotid space and obscuring the left level 1B, level 2, and level IIIa nodal stations. This study will serve as a new postoperative baseline of this area. Attention directed on close interval follow-up. 3. Mild soft tissue thickening also along the left lateral tongue resection margin. Attention directed on followup. 4. Chest CT today reported separately      03/14/2016 Procedure    She has normal baseline hearing test      03/15/2016 Procedure    1. Successful placement of a right internal jugular approach power injectable Port-A-Cath. The Port a catheter is ready for immediate use. 2. Successful fluoroscopic insertion of a 20-French pull-through gastrostomy tube. The gastrostomy may be used immediately for medication administration and may be utilized in 24 hrs for the initiation of feeds.      03/15/2016 - 04/30/2016 Radiation Therapy    Tongue and Bilateral Neck treated to 68 Gy in 33 fractions      03/16/2016 - 04/27/2016 Chemotherapy    She received weekly cisplatin       04/04/2016 Adverse Reaction    Chemotherapy is placed on hold due to acute renal failure      04/13/2016 Adverse  Reaction    Chemo was placed on hold due to shingles outbreak and paronychia      Since the last time I saw her, she has felt worse. She has no bowel movement for 5 days. She has uncontrolled nausea and vomiting  and is not able to tolerate any kind of nutritional feeding oral intake for at least 3 days When she arrived in the outpatient clinic, she is grossly dehydrated and appears very dizzy She denies excessive pain The current prescription fentanyl patch was adequate to control her pain She has excessive mucus gagging since radiation treatment was completed She is feeling very miserable and crying in my office because of feeling sick. She denies cough, fever or chills she felt very weak   MEDICAL HISTORY:  Past Medical History:  Diagnosis Date  . Anxiety   . Arthritis   . Depression    takes ZYban daily  . History of colon polyps    benign  . History of radiation therapy 03/15/16- 04/30/16   Tongue and bilateral neck 68 Gy in 33 fractions  . Hyperlipidemia    takes Atorvastatin daily  . Hypertension    takes HCTZ daily  . Hypomagnesemia 05/04/2016  . Hypothyroidism    takes Synthroid daily  . Insomnia    takes Ambien nightly and Xanax as needed  . Refusal of blood transfusions as patient is Jehovah's Witness     SURGICAL HISTORY: Past Surgical History:  Procedure Laterality Date  . CHOLECYSTECTOMY    . COLONOSCOPY    . DILATION AND CURETTAGE OF UTERUS    . EXCISION OF TONGUE LESION Left 02/08/2016   Procedure: WIDE LOCAL EXCISION OF LEFT LATERAL TONGUE;  Surgeon: Jerrell Belfast, MD;  Location: [place];  Service: ENT;  Laterality: Left;  . FEMUR IM NAIL Right 02/28/2012   Procedure: OPEN REDUCTION INTERNAL FIXATION (ORIF) RIGHT KNEE;  Surgeon: Alta Corning, MD;  Location: [place];  Service: Orthopedics;  Laterality: Right;  . IR GENERIC HISTORICAL  03/15/2016   IR FLUORO GUIDE PORT INSERTION RIGHT 03/15/2016 Sandi Mariscal, MD [facility]  . IR GENERIC HISTORICAL  03/15/2016   IR US GUIDE VASC ACCESS RIGHT 03/15/2016 Sandi Mariscal, MD [facility]  . IR GENERIC HISTORICAL  03/15/2016   IR GASTROSTOMY TUBE MOD SED 03/15/2016 [facility]  . KNEE ARTHROPLASTY  12/14/2011   Procedure: COMPUTER  ASSISTED TOTAL KNEE ARTHROPLASTY;  Surgeon: Alta Corning, MD;  Location: [place];  Service: Orthopedics;  Laterality: Right;  . KNEE ARTHROSCOPY     Right  . PLANTAR FASCIA SURGERY     right foot  . RADICAL NECK DISSECTION Left 02/08/2016   Procedure: LEFT SELECTIVE RADICAL NECK DISSECTION;  Surgeon: Jerrell Belfast, MD;  Location: [place];  Service: ENT;  Laterality: Left;  . TENDON RELEASE     right wrist  . TOTAL KNEE ARTHROPLASTY  12/14/2011   right knee    SOCIAL HISTORY: Social History   Social History  . Marital status: Married    Spouse name: Ebony Hail  . Number of children: 2  . Years of education: College   Occupational History  . CUSTOMER SERVICE REP  At [employer]   Social History Main Topics  . Smoking status: Never Smoker  . Smokeless tobacco: Never Used  . Alcohol use Yes     Comment: very rarely  . Drug use: No  . Sexual activity: Not on file   Other Topics Concern  . Not on file  Social History Narrative   Patient is married Warden/ranger) and lives at home with her husband.   Patient has a college education.   Patient drinks two sodas per day.   Patient has two children.   Patient is right-handed.          FAMILY HISTORY: Family History  Problem Relation Age of Onset  . Anemia Mother   . Hypertension Mother   . Heart attack Father     ALLERGIES:  has No Known Allergies.  MEDICATIONS:  Current Facility-Administered Medications  Medication Dose Route Frequency Provider Last Rate Last Dose  . 0.9 % NaCl with KCl 20 mEq/ L  infusion   Intravenous Continuous Alvy Bimler, Christapher Gillian, MD      . acetaminophen (TYLENOL) tablet 650 mg  650 mg Oral Q4H PRN Alvy Bimler, Eimi Viney, MD      . ALPRAZolam Duanne Moron) tablet 0.5 mg  0.5 mg Oral QHS PRN Alvy Bimler, Tiyonna Sardinha, MD      . buPROPion (ZYBAN) 12 hr tablet 150 mg  150 mg Oral Daily Jennavie Martinek, MD      . enoxaparin (LOVENOX) injection 40 mg  40 mg Subcutaneous Q24H Saifullah Jolley, MD      . fentaNYL (DURAGESIC - dosed mcg/hr) 75 mcg  75 mcg  Transdermal Q72H Timera Windt, MD      . Glycerin (Adult) 2.1 g suppository 1 suppository  1 suppository Rectal Once , Cezar Misiaszek, MD      . HYDROmorphone (DILAUDID) injection 1 mg  1 mg Intravenous Q2H PRN [name], MD      . Derrill Memo ON 05/30/2016] levothyroxine (SYNTHROID, LEVOTHROID) tablet 75 mcg  75 mcg Oral QAC breakfast Alvy Bimler, Kerianne Gurr, MD      . lidocaine-prilocaine (EMLA) cream   Topical Once Alvy Bimler, Lodema Parma, MD      . LORazepam (ATIVAN) tablet 1 mg  1 mg Oral Q8H PRN , Diyana Starrett, MD      . magnesium citrate solution 1 Bottle  1 Bottle Oral Once PRN Alvy Bimler, Hildagard Sobecki, MD      . magnesium sulfate IVPB 4 g 100 mL  4 g Intravenous Once Alvy Bimler, Jessiah Steinhart, MD      . morphine (ROXANOL) 20 MG/ML concentrated solution 20 mg  20 mg Oral Q2H PRN Alvy Bimler, Jachin Coury, MD      . ondansetron (ZOFRAN) tablet 4-8 mg  4-8 mg Oral Q8H PRN Alvy Bimler, Adrionna Delcid, MD       Or  . ondansetron (ZOFRAN-ODT) disintegrating tablet 4-8 mg  4-8 mg Oral Q8H PRN Alvy Bimler, Rosilyn Coachman, MD       Or  . ondansetron (ZOFRAN) injection 4 mg  4 mg Intravenous Q8H PRN Anam Bobby, MD       Or  . ondansetron (ZOFRAN) 8 mg in sodium chloride 0.9 % 50 mL IVPB  8 mg Intravenous Q8H PRN Jamarco Zaldivar, MD      . scopolamine (TRANSDERM-SCOP) 1 MG/3DAYS 1.5 mg  1 patch Transdermal Q72H Jermiya Reichl, MD      . senna (SENOKOT) tablet 8.6 mg  1 tablet Oral BID Alvy Bimler, , MD      . senna-docusate (Senokot-S) tablet 1 tablet  1 tablet Oral QHS PRN [name], MD       Facility-Administered Medications Ordered in Other Encounters  Medication Dose Route Frequency Provider Last Rate Last Dose  . heparin lock flush 100 unit/mL  500 Units Intracatheter Once PRN Alvy Bimler, , MD      . sodium chloride flush (NS) 0.9 % injection 10 mL  10 mL  Intracatheter PRN [name], MD        REVIEW OF SYSTEMS:   Constitutional: Denies fevers, chills or abnormal night sweats Eyes: Denies blurriness of vision, double vision or watery eyes Respiratory: Denies cough, dyspnea or  wheezes Cardiovascular: Denies palpitation, chest discomfort or lower extremity swelling Skin: Denies abnormal skin rashes Lymphatics: Denies new lymphadenopathy or easy bruising Behavioral/Psych: Mood is stable, no new changes  All other systems were reviewed with the patient and are negative.  PHYSICAL EXAMINATION: ECOG PERFORMANCE STATUS: 2 - Symptomatic, <50% confined to bed  Vitals:   05/29/16 1317  BP: 124/75  Pulse: 84  Resp: 16  Temp: 98.5 F (36.9 C)   Filed Weights   05/29/16 1317  Weight: 184 lb (83.5 kg)    GENERAL:alert, no distress and comfortable SKIN: skin color, texture, turgor are normal, no rashes or significant lesions EYES: normal, conjunctiva are pink and non-injected, sclera clear OROPHARYNX:no exudate, no erythema and lips, buccal mucosa.  No signs of mucositis or thrush. NECK: supple, thyroid normal size, non-tender, without nodularity LYMPH:  no palpable lymphadenopathy in the cervical, axillary or inguinal LUNGS: clear to auscultation and percussion with normal breathing effort HEART: regular rate & rhythm and no murmurs and no lower extremity edema ABDOMEN:abdomen soft, non-tender and normal bowel sounds.  Feeding tube site looks okay Musculoskeletal:no cyanosis of digits and no clubbing  PSYCH: alert & oriented x 3 with fluent speech NEURO: no focal motor/sensory deficits  LABORATORY DATA:  I have reviewed the data as listed Lab Results  Component Value Date   WBC 3.6 (L) 05/29/2016   HGB 10.6 (L) 05/29/2016   HCT 30.2 (L) 05/29/2016   MCV 85.7 05/29/2016   PLT 150 05/29/2016    Recent Labs  02/07/16 0836 02/08/16 1859  04/25/16 1023 05/04/16 1031 05/29/16 1242  NA 139  --   < > 140 140 142  K 3.0*  --   < > 3.6 3.6 2.9*  CL 99*  --   --   --   --   --   CO2 29  --   < > 29 28 28   GLUCOSE 106*  --   < > 101 96 85  BUN 9  --   < > 12.0 13.1 10.4  CREATININE 1.29* 1.25*  < > 1.2* 1.1 0.9  CALCIUM 9.3  --   < > 9.3 9.4 8.6   GFRNONAA 43* 45*  --   --   --   --   GFRAA 50* 52*  --   --   --   --   PROT  --   --   < > 7.2 7.1 6.6  ALBUMIN  --   --   < > 3.5 3.5 3.2*  AST  --   --   < > 25 31 22   ALT  --   --   < > 14 20 11   ALKPHOS  --   --   < > 107 101 73  BILITOT  --   --   < > 0.66 0.69 1.11  < > = values in this interval not displayed.  RADIOGRAPHIC STUDIES: I have personally reviewed the radiological images as listed and agreed with the findings in the report. Dg Chest 2 View  Result Date: 05/29/2016 CLINICAL DATA:  Head and neck cancer. EXAM: CHEST  2 VIEW COMPARISON:  CT 03/02/2016 . FINDINGS: PowerPort catheter with lead tip projected over superior vena cava. Cardiomegaly with normal pulmonary  vascularity. No focal infiltrate. No pleural effusion or pneumothorax. Thoracic spine scoliosis. IMPRESSION: 1. PowerPort catheter noted with lead tip projected over superior vena cava. 2. No acute cardiopulmonary disease. Electronically Signed   By: Marcello Moores  Register   On: 05/29/2016 13:11   Dg Abd 2 Views  Result Date: 05/29/2016 CLINICAL DATA:  Head neck cancer.  G-tube placement. EXAM: ABDOMEN - 2 VIEW COMPARISON:  03/13/2016 . FINDINGS: Surgical clips noted over the upper abdomen and lower pelvis. G-tube noted with its tip over the stomach. No gastric distention. No bowel distention or free air. No free air. IMPRESSION: G-tube noted over the stomach. No evidence of gastric or bowel distention. Electronically Signed   By: Marcello Moores  Register   On: 05/29/2016 13:09    ASSESSMENT & PLAN:  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[place]) She has completed recent chemoradiation treatment. She is still experiencing significant side effects She will continue aggressive supportive care  Cancer associated pain Her pain is better controlled I recommend slow taper of fentanyl from 75 mcg to 50 mcg the next dose change.  Continuous salivary secretion She will continue scopolamine patch for now  Severe uncontrolled  nausea and vomiting with clinical signs of dehydration and excessive dizziness Severe constipation Likely exacerbated by severe constipation Continue IV fluids.  We will give her magnesium citrate along with suppository for severe constipation  Weight loss with moderate protein calorie malnutrition She was not able to use of feeding tube due to uncontrolled nausea and vomiting Abdominal x-ray showed no evidence of bowel obstruction We will consult dietitian for continuous tube feed while hospitalized  Severe hypomagnesemia and hypokalemia We will replace IV magnesium and IV potassium intravenously  Acquired pancytopenia This is due to recent treatment She is not symptomatic Observe closely  DVT prophylaxis We will start her on Lovenox  CODE STATUS Full code  Discharge planning She is currently admitted under observation status I hope she can be discharged within 48 hours.  If not, we will convert her admission status to full inpatient admission     [name], MD 05/29/2016 1:23 PM

## 2016-05-30 ENCOUNTER — Encounter (HOSPITAL_COMMUNITY): Payer: Self-pay

## 2016-05-30 ENCOUNTER — Ambulatory Visit: Payer: Self-pay

## 2016-05-30 LAB — COMPREHENSIVE METABOLIC PANEL
ALT: 11 U/L — AB (ref 14–54)
AST: 20 U/L (ref 15–41)
Albumin: 3 g/dL — ABNORMAL LOW (ref 3.5–5.0)
Alkaline Phosphatase: 59 U/L (ref 38–126)
Anion gap: 7 (ref 5–15)
BILIRUBIN TOTAL: 1.1 mg/dL (ref 0.3–1.2)
BUN: 10 mg/dL (ref 6–20)
CALCIUM: 8.1 mg/dL — AB (ref 8.9–10.3)
CO2: 31 mmol/L (ref 22–32)
CREATININE: 0.94 mg/dL (ref 0.44–1.00)
Chloride: 102 mmol/L (ref 101–111)
Glucose, Bld: 104 mg/dL — ABNORMAL HIGH (ref 65–99)
Potassium: 3.1 mmol/L — ABNORMAL LOW (ref 3.5–5.1)
Sodium: 140 mmol/L (ref 135–145)
Total Protein: 6.2 g/dL — ABNORMAL LOW (ref 6.5–8.1)

## 2016-05-30 LAB — URINALYSIS, COMPLETE (UACMP) WITH MICROSCOPIC
BILIRUBIN URINE: NEGATIVE
GLUCOSE, UA: NEGATIVE mg/dL
Hgb urine dipstick: NEGATIVE
KETONES UR: 80 mg/dL — AB
LEUKOCYTES UA: NEGATIVE
Nitrite: NEGATIVE
PH: 6 (ref 5.0–8.0)
PROTEIN: NEGATIVE mg/dL
Specific Gravity, Urine: 1.02 (ref 1.005–1.030)

## 2016-05-30 LAB — GLUCOSE, CAPILLARY
GLUCOSE-CAPILLARY: 121 mg/dL — AB (ref 65–99)
GLUCOSE-CAPILLARY: 118 mg/dL — AB (ref 65–99)
Glucose-Capillary: 109 mg/dL — ABNORMAL HIGH (ref 65–99)
Glucose-Capillary: 115 mg/dL — ABNORMAL HIGH (ref 65–99)
Glucose-Capillary: 118 mg/dL — ABNORMAL HIGH (ref 65–99)
Glucose-Capillary: 87 mg/dL (ref 65–99)

## 2016-05-30 LAB — HIV ANTIBODY (ROUTINE TESTING W REFLEX): HIV SCREEN 4TH GENERATION: NONREACTIVE

## 2016-05-30 LAB — MAGNESIUM: Magnesium: 2 mg/dL (ref 1.7–2.4)

## 2016-05-30 LAB — PHOSPHORUS: Phosphorus: 2.8 mg/dL (ref 2.5–4.6)

## 2016-05-30 MED ORDER — FENTANYL 50 MCG/HR TD PT72
50.0000 ug | MEDICATED_PATCH | TRANSDERMAL | Status: DC
  Administered 2016-06-01: 50 ug via TRANSDERMAL
  Filled 2016-05-30: qty 1

## 2016-05-30 MED ORDER — OSMOLITE 1.5 CAL PO LIQD
1000.0000 mL | ORAL | Status: DC
  Administered 2016-05-30 – 2016-05-31 (×2): 1000 mL
  Filled 2016-05-30 (×3): qty 1000

## 2016-05-30 MED ORDER — POTASSIUM CHLORIDE 10 MEQ/50ML IV SOLN
10.0000 meq | INTRAVENOUS | Status: AC
  Administered 2016-05-30 (×4): 10 meq via INTRAVENOUS
  Filled 2016-05-30 (×4): qty 50

## 2016-05-30 MED ORDER — BISACODYL 10 MG RE SUPP
10.0000 mg | Freq: Once | RECTAL | Status: AC
  Administered 2016-05-30: 10 mg via RECTAL
  Filled 2016-05-30: qty 1

## 2016-05-30 NOTE — Progress Notes (Signed)
Nutrition Brief Follow-up  Patient is receiving Osmolite 1.5 @ 15 ml/hr and states she is tolerating without issue. She agrees to further advancing TF towards goal rate. Will advance to 25 ml/hr today and advance every 12 hours to goal rate of 65 ml/hr.   Pt states she has started feeling hungry and is going to try some jello today. Pt feeling better so far today.  Would continue to monitor for signs of refeeding syndrome.   RD will continue to monitor.  Clayton Bibles, MS, RD, LDN Pager: [phone] After Hours Pager: [phone]

## 2016-05-30 NOTE — Progress Notes (Signed)
Pt. Tolerated tube feedings well throughout the night. No nausea or cramping. Experiencing secretions in the throat that have slightly improved.

## 2016-05-30 NOTE — Progress Notes (Signed)
[place]   DOB:March 21, 1955   KC#:[id number]    Subjective: She felt better. Nausea is improved. She was able to tolerate PEG feeding so far. Still no bowel movement. Denies pain  Objective:  Vitals:   05/29/16 2046 05/30/16 0510  BP: 128/67 123/70  Pulse: 79 77  Resp: 16 18  Temp: 98.6 F (37 C) 98.1 F (36.7 C)     Intake/Output Summary (Last 24 hours) at 05/30/16 0806 Last data filed at 05/30/16 0500  Gross per 24 hour  Intake          1671.67 ml  Output              450 ml  Net          1221.67 ml    GENERAL:alert, no distress and comfortable SKIN: skin color, texture, turgor are normal, no rashes or significant lesions EYES: normal, Conjunctiva are pink and non-injected, sclera clear Musculoskeletal:no cyanosis of digits and no clubbing  NEURO: alert & oriented x 3 with fluent speech, no focal motor/sensory deficits   Labs:  Lab Results  Component Value Date   WBC 3.6 (L) 05/29/2016   HGB 10.6 (L) 05/29/2016   HCT 30.2 (L) 05/29/2016   MCV 85.7 05/29/2016   PLT 150 05/29/2016   NEUTROABS 2.8 05/29/2016    Lab Results  Component Value Date   NA 140 05/30/2016   K 3.1 (L) 05/30/2016   CL 102 05/30/2016   CO2 31 05/30/2016    Studies:  Dg Chest 2 View  Result Date: 05/29/2016 CLINICAL DATA:  Head and neck cancer. EXAM: CHEST  2 VIEW COMPARISON:  CT 03/02/2016 . FINDINGS: PowerPort catheter with lead tip projected over superior vena cava. Cardiomegaly with normal pulmonary vascularity. No focal infiltrate. No pleural effusion or pneumothorax. Thoracic spine scoliosis. IMPRESSION: 1. PowerPort catheter noted with lead tip projected over superior vena cava. 2. No acute cardiopulmonary disease. Electronically Signed   By: Marcello Moores  Register   On: 05/29/2016 13:11   Dg Abd 2 Views  Result Date: 05/29/2016 CLINICAL DATA:  Head neck cancer.  G-tube placement. EXAM: ABDOMEN - 2 VIEW COMPARISON:  03/13/2016 . FINDINGS: Surgical clips noted over the upper abdomen and  lower pelvis. G-tube noted with its tip over the stomach. No gastric distention. No bowel distention or free air. No free air. IMPRESSION: G-tube noted over the stomach. No evidence of gastric or bowel distention. Electronically Signed   By: Marcello Moores  Register   On: 05/29/2016 13:09    Assessment & Plan:   Cancer of anterior two-thirds of tongue [facility]) She has completed recent chemoradiation treatment. She is still experiencing significant side effects She will continue aggressive supportive care  Cancer associated pain Her pain is better controlled I recommend slow taper of fentanyl from 75 mcg to 50 mcg the next dose change.  Continuous salivary secretion She will continue scopolamine patch for now  Severe uncontrolled nausea and vomiting with clinical signs of dehydration and excessive dizziness Severe constipation Likely exacerbated by severe constipation Continue IV fluids.  We will give her magnesium citrate along with suppository for severe constipation  Weight loss with moderate protein calorie malnutrition She was not able to use of feeding tube due to uncontrolled nausea and vomiting Abdominal x-ray showed no evidence of bowel obstruction She was started on tube feeding again on 05/29/16, will titrate up to goal  Severe hypomagnesemia and hypokalemia We will replace IV magnesium and IV potassium intravenously as needed  Acquired pancytopenia  This is due to recent treatment She is not symptomatic, no need transfusion Observe closely  DVT prophylaxis On Lovenox  CODE STATUS Full code  Discharge planning She is currently admitted under observation status I hope she can be discharged within 48 hours.  If not, we will convert her admission status to full inpatient admission  [name], MD 05/30/2016  8:06 AM

## 2016-05-30 NOTE — Progress Notes (Signed)
Unable to crush pt's buproprione 150mg  ER for gtube. MD paged to changed order for IR tablet.

## 2016-05-30 NOTE — Progress Notes (Signed)
This CM met with pt at bedside for DC needs. Pt states she has continuous tube feeds at home with feedings and pump both provided by [facility]. CM will continue to follow.  Marney Doctor RN,BSN,NCM [phone]

## 2016-05-31 ENCOUNTER — Ambulatory Visit: Payer: Self-pay

## 2016-05-31 ENCOUNTER — Ambulatory Visit: Payer: Self-pay | Admitting: Hematology and Oncology

## 2016-05-31 ENCOUNTER — Encounter: Payer: Self-pay | Admitting: *Deleted

## 2016-05-31 ENCOUNTER — Encounter (HOSPITAL_COMMUNITY): Payer: Self-pay

## 2016-05-31 DIAGNOSIS — E876 Hypokalemia: Secondary | ICD-10-CM | POA: Diagnosis present

## 2016-05-31 DIAGNOSIS — C023 Malignant neoplasm of anterior two-thirds of tongue, part unspecified: Secondary | ICD-10-CM | POA: Diagnosis present

## 2016-05-31 DIAGNOSIS — Z923 Personal history of irradiation: Secondary | ICD-10-CM | POA: Diagnosis not present

## 2016-05-31 DIAGNOSIS — E860 Dehydration: Secondary | ICD-10-CM | POA: Diagnosis present

## 2016-05-31 DIAGNOSIS — K5900 Constipation, unspecified: Secondary | ICD-10-CM | POA: Diagnosis present

## 2016-05-31 DIAGNOSIS — F329 Major depressive disorder, single episode, unspecified: Secondary | ICD-10-CM | POA: Diagnosis present

## 2016-05-31 DIAGNOSIS — Z6828 Body mass index (BMI) 28.0-28.9, adult: Secondary | ICD-10-CM | POA: Diagnosis not present

## 2016-05-31 DIAGNOSIS — D61818 Other pancytopenia: Secondary | ICD-10-CM | POA: Diagnosis present

## 2016-05-31 DIAGNOSIS — Z931 Gastrostomy status: Secondary | ICD-10-CM | POA: Diagnosis not present

## 2016-05-31 DIAGNOSIS — E440 Moderate protein-calorie malnutrition: Secondary | ICD-10-CM | POA: Diagnosis present

## 2016-05-31 DIAGNOSIS — E785 Hyperlipidemia, unspecified: Secondary | ICD-10-CM | POA: Diagnosis present

## 2016-05-31 DIAGNOSIS — Z8249 Family history of ischemic heart disease and other diseases of the circulatory system: Secondary | ICD-10-CM | POA: Diagnosis not present

## 2016-05-31 DIAGNOSIS — G893 Neoplasm related pain (acute) (chronic): Secondary | ICD-10-CM | POA: Diagnosis present

## 2016-05-31 DIAGNOSIS — R1310 Dysphagia, unspecified: Secondary | ICD-10-CM | POA: Diagnosis present

## 2016-05-31 DIAGNOSIS — F419 Anxiety disorder, unspecified: Secondary | ICD-10-CM | POA: Diagnosis present

## 2016-05-31 DIAGNOSIS — Z9221 Personal history of antineoplastic chemotherapy: Secondary | ICD-10-CM | POA: Diagnosis not present

## 2016-05-31 DIAGNOSIS — E039 Hypothyroidism, unspecified: Secondary | ICD-10-CM | POA: Diagnosis present

## 2016-05-31 DIAGNOSIS — R112 Nausea with vomiting, unspecified: Secondary | ICD-10-CM | POA: Diagnosis present

## 2016-05-31 DIAGNOSIS — K117 Disturbances of salivary secretion: Secondary | ICD-10-CM | POA: Diagnosis present

## 2016-05-31 DIAGNOSIS — R42 Dizziness and giddiness: Secondary | ICD-10-CM | POA: Diagnosis not present

## 2016-05-31 DIAGNOSIS — I10 Essential (primary) hypertension: Secondary | ICD-10-CM | POA: Diagnosis present

## 2016-05-31 LAB — BASIC METABOLIC PANEL
Anion gap: 6 (ref 5–15)
BUN: 7 mg/dL (ref 6–20)
CHLORIDE: 100 mmol/L — AB (ref 101–111)
CO2: 30 mmol/L (ref 22–32)
Calcium: 8.4 mg/dL — ABNORMAL LOW (ref 8.9–10.3)
Creatinine, Ser: 1.08 mg/dL — ABNORMAL HIGH (ref 0.44–1.00)
GFR calc Af Amer: >60 mL/min (ref >60)
GFR calc non Af Amer: 54 mL/min — ABNORMAL LOW (ref >60)
GLUCOSE: 139 mg/dL — AB (ref 65–99)
POTASSIUM: 3.5 mmol/L (ref 3.5–5.1)
Sodium: 136 mmol/L (ref 135–145)

## 2016-05-31 LAB — PHOSPHORUS: Phosphorus: 1.6 mg/dL — ABNORMAL LOW (ref 2.5–4.6)

## 2016-05-31 LAB — GLUCOSE, CAPILLARY
GLUCOSE-CAPILLARY: 158 mg/dL — AB (ref 65–99)
GLUCOSE-CAPILLARY: 107 mg/dL — AB (ref 65–99)
Glucose-Capillary: 107 mg/dL — ABNORMAL HIGH (ref 65–99)
Glucose-Capillary: 125 mg/dL — ABNORMAL HIGH (ref 65–99)
Glucose-Capillary: 136 mg/dL — ABNORMAL HIGH (ref 65–99)
Glucose-Capillary: 139 mg/dL — ABNORMAL HIGH (ref 65–99)

## 2016-05-31 LAB — MAGNESIUM: Magnesium: 1.5 mg/dL — ABNORMAL LOW (ref 1.7–2.4)

## 2016-05-31 MED ORDER — MAGNESIUM SULFATE 2 GM/50ML IV SOLN
2.0000 g | Freq: Once | INTRAVENOUS | Status: AC
  Administered 2016-05-31: 2 g via INTRAVENOUS
  Filled 2016-05-31: qty 50

## 2016-05-31 MED ORDER — POTASSIUM PHOSPHATES 15 MMOLE/5ML IV SOLN
30.0000 mmol | Freq: Once | INTRAVENOUS | Status: AC
  Administered 2016-05-31: 30 mmol via INTRAVENOUS
  Filled 2016-05-31: qty 10

## 2016-05-31 MED ORDER — MAGNESIUM CITRATE PO SOLN
1.0000 | Freq: Once | ORAL | Status: AC
  Administered 2016-05-31: 1 via ORAL
  Filled 2016-05-31: qty 296

## 2016-05-31 NOTE — Progress Notes (Signed)
Nutrition Follow-up  DOCUMENTATION CODES:   Severe malnutrition in context of chronic illness  INTERVENTION:   Monitor magnesium, potassium, and phosphorus daily for at least 3 days, MD to replete as needed, as pt is at risk for refeeding syndrome given severe malnutrition, inadequate PO and TF infusion over the past month, low K and Mg levels.  5/31:Continue Osmolite 1.5 @ 25 ml/hr.  Once able to tolerate feeds, will advance every 12 hours to goal rate of 65 ml/hr. 30 ml Prostat BID. Goal rate provides 2540 kcal (100% of needs),  127g protein (101% of needs), and 1188 ml H2O.   Will provide liquid MVI per tube.  RD to continue to monitor  NUTRITION DIAGNOSIS:   Malnutrition related to chronic illness, cancer and cancer related treatments as evidenced by percent weight loss, energy intake < or equal to 75% for > or equal to 1 month.  Ongoing.  GOAL:   Patient will meet greater than or equal to 90% of their needs  Progressing.  MONITOR:   PO intake, Labs, Weight trends, TF tolerance, I & O's  ASSESSMENT:   61 y.o. female is admitted for management of above symptoms: Severe dehydration, dizziness, uncontrolled nausea and vomiting.  Patient states that she just vomited which has been the only episode this admission. Pt states it was yellowish-tan in color. Pt waiting to receive nausea medication. RD will keep Osmolite 1.5 @ 25 ml/hr today. Pt is also showing signs of refeeding syndrome. Will continue to monitor for ability to advance.   Medications: Liquid MVI daily, IV Zofran PRN, IV KPHOS once Labs reviewed: CBGs: 125-136 Low Mg, Phos  Diet Order:  Diet regular Room service appropriate? Yes; Fluid consistency: Thin  Skin:  Reviewed, no issues  Last BM:  5/30  Height:   Ht Readings from Last 1 Encounters:  05/29/16 5\' 7"  (1.702 m)    Weight:   Wt Readings from Last 1 Encounters:  05/31/16 188 lb 7.9 oz (85.5 kg)    Ideal Body Weight:  61.4 kg  BMI:  Body  mass index is 29.52 kg/m.  Estimated Nutritional Needs:   Kcal:  7341-9379  Protein:  125-135g  Fluid:  2.5L/day  EDUCATION NEEDS:   Education needs addressed  Clayton Bibles, MS, RD, LDN Pager: [phone] After Hours Pager: [phone]

## 2016-05-31 NOTE — Progress Notes (Signed)
Tiffany Yu   DOB:30-Aug-1955   JS#:[id number]    Subjective: She has no bowel movement yet.  She continues to have mild nausea.  She denies significant pain.  She is able to tolerate slow nutritional supplement through the feeding tube.  She complained of mild weakness and generalized fatigue  Objective:  Vitals:   05/30/16 2030 05/31/16 0453  BP: 122/79 (!) 104/57  Pulse: 97 (!) 105  Resp: 16 16  Temp: 99.9 F (37.7 C) 99.5 F (37.5 C)     Intake/Output Summary (Last 24 hours) at 05/31/16 6073 Last data filed at 05/31/16 0454  Gross per 24 hour  Intake              745 ml  Output             1000 ml  Net             -255 ml    GENERAL:alert, no distress and comfortable SKIN: skin color, texture, turgor are normal, no rashes or significant lesions EYES: normal, Conjunctiva are pink and non-injected, sclera clear OROPHARYNX:no exudate, no erythema and lips, buccal mucosa, and tongue normal  NECK: supple, thyroid normal size, non-tender, without nodularity LYMPH:  no palpable lymphadenopathy in the cervical, axillary or inguinal LUNGS: clear to auscultation and percussion with normal breathing effort HEART: regular rate & rhythm and no murmurs and no lower extremity edema ABDOMEN:abdomen soft, non-tender and normal bowel sounds Musculoskeletal:no cyanosis of digits and no clubbing  NEURO: alert & oriented x 3 with fluent speech, no focal motor/sensory deficits   Labs:  Lab Results  Component Value Date   WBC 3.6 (L) 05/29/2016   HGB 10.6 (L) 05/29/2016   HCT 30.2 (L) 05/29/2016   MCV 85.7 05/29/2016   PLT 150 05/29/2016   NEUTROABS 2.8 05/29/2016    Lab Results  Component Value Date   NA 136 05/31/2016   K 3.5 05/31/2016   CL 100 (L) 05/31/2016   CO2 30 05/31/2016    Studies:  Dg Chest 2 View  Result Date: 05/29/2016 CLINICAL DATA:  Head and neck cancer. EXAM: CHEST  2 VIEW COMPARISON:  CT 03/02/2016 . FINDINGS: PowerPort catheter with lead tip projected over  superior vena cava. Cardiomegaly with normal pulmonary vascularity. No focal infiltrate. No pleural effusion or pneumothorax. Thoracic spine scoliosis. IMPRESSION: 1. PowerPort catheter noted with lead tip projected over superior vena cava. 2. No acute cardiopulmonary disease. Electronically Signed   By: Marcello Moores  Register   On: 05/29/2016 13:11   Dg Abd 2 Views  Result Date: 05/29/2016 CLINICAL DATA:  Head neck cancer.  G-tube placement. EXAM: ABDOMEN - 2 VIEW COMPARISON:  03/13/2016 . FINDINGS: Surgical clips noted over the upper abdomen and lower pelvis. G-tube noted with its tip over the stomach. No gastric distention. No bowel distention or free air. No free air. IMPRESSION: G-tube noted over the stomach. No evidence of gastric or bowel distention. Electronically Signed   By: Marcello Moores  Register   On: 05/29/2016 13:09    Assessment & Plan:   Cancer of anterior two-thirds of tongue [facility]) She has completed recent chemoradiation treatment. She is still experiencing significant side effects She will continue aggressive supportive care  Cancer associated pain Her pain is better controlled I recommend slow taper of fentanyl from 75 mcg to 50 mcg the next dose change.  Continuous salivary secretion She will continue scopolamine patch for now  Severe uncontrolled nausea and vomiting with clinical signs of dehydration and excessive  dizziness Severe constipation Likely exacerbated by severe constipation Continue IV fluids. We will give her magnesium citrate along with suppository for severe constipation I will add tap water enema today  Weight loss with moderate protein calorie malnutrition She was not able to use of feeding tube due to uncontrolled nausea and vomiting Abdominal x-ray showedno evidence of bowel obstruction She was started on tube feeding again on 05/29/16, will titrate up to goal  Severe hypomagnesemia, hypophosphatemia and hypokalemia, all signs of refeeding syndrome We  will replace IV magnesium, IV potassium and IV phosphorus intravenously as needed She has classic signs of refeeding syndrome We will continue to monitor electrolytes carefully daily and replace as needed  Acquired pancytopenia This is due to recent treatment She is not symptomatic, no need transfusion Observe closely  DVT prophylaxis On Lovenox  CODE STATUS Full code  Discharge planning She is improving slowly but not safe for discharge I anticipate she will be here at least the next 3-4 days before she is safe for discharge due to signs of refeeding syndrome I would convert her observation status to inpatient status   [name], MD 05/31/2016  8:19 AM

## 2016-05-31 NOTE — Progress Notes (Signed)
Pt received tap water enema. Pt had good results

## 2016-05-31 NOTE — Progress Notes (Signed)
Oncology Nurse Navigator Documentation  Visited Ms Tiffany Yu 1343 to check on her well-being, provide support and encouragement. She reported feeling much better since her admission on Tuesday, noted minimal N&V. She attributed improvement to continuous IV and nutritional supplement via pump. Subjectively, she seemed to be in good spirits, presented positive attitude.  [name], RN, BSN, [place] [facility] Nurse [place] [facility] [place] [phone]

## 2016-06-01 ENCOUNTER — Encounter: Payer: Self-pay | Admitting: Nutrition

## 2016-06-01 ENCOUNTER — Ambulatory Visit: Payer: Self-pay | Admitting: Radiation Oncology

## 2016-06-01 ENCOUNTER — Ambulatory Visit: Payer: Self-pay

## 2016-06-01 LAB — BASIC METABOLIC PANEL
ANION GAP: 6 (ref 5–15)
BUN: 7 mg/dL (ref 6–20)
CHLORIDE: 104 mmol/L (ref 101–111)
CO2: 31 mmol/L (ref 22–32)
Calcium: 8.6 mg/dL — ABNORMAL LOW (ref 8.9–10.3)
Creatinine, Ser: 0.98 mg/dL (ref 0.44–1.00)
GFR calc Af Amer: >60 mL/min (ref >60)
GFR calc non Af Amer: >60 mL/min (ref >60)
GLUCOSE: 117 mg/dL — AB (ref 65–99)
POTASSIUM: 3.6 mmol/L (ref 3.5–5.1)
Sodium: 141 mmol/L (ref 135–145)

## 2016-06-01 LAB — GLUCOSE, CAPILLARY
GLUCOSE-CAPILLARY: 122 mg/dL — AB (ref 65–99)
GLUCOSE-CAPILLARY: 136 mg/dL — AB (ref 65–99)
GLUCOSE-CAPILLARY: 122 mg/dL — AB (ref 65–99)
GLUCOSE-CAPILLARY: 103 mg/dL — AB (ref 65–99)
Glucose-Capillary: 106 mg/dL — ABNORMAL HIGH (ref 65–99)
Glucose-Capillary: 110 mg/dL — ABNORMAL HIGH (ref 65–99)

## 2016-06-01 LAB — PHOSPHORUS: PHOSPHORUS: 3 mg/dL (ref 2.5–4.6)

## 2016-06-01 LAB — MAGNESIUM: Magnesium: 1.9 mg/dL (ref 1.7–2.4)

## 2016-06-01 MED ORDER — OSMOLITE 1.5 CAL PO LIQD: 1000.0000 mL | ORAL | Status: DC

## 2016-06-01 MED ORDER — POLYETHYLENE GLYCOL 3350 17 G PO PACK
17.0000 g | PACK | Freq: Every day | ORAL | Status: DC
  Administered 2016-06-01: 17 g
  Filled 2016-06-01 (×3): qty 1

## 2016-06-01 MED ORDER — OSMOLITE 1.5 CAL PO LIQD
1000.0000 mL | ORAL | Status: DC
  Administered 2016-06-01 – 2016-06-04 (×7): 1000 mL
  Filled 2016-06-01 (×6): qty 1000

## 2016-06-01 MED ORDER — PRO-STAT SUGAR FREE PO LIQD
30.0000 mL | Freq: Two times a day (BID) | ORAL | Status: DC
  Administered 2016-06-01 – 2016-06-04 (×7): 30 mL
  Filled 2016-06-01 (×7): qty 30

## 2016-06-01 NOTE — Progress Notes (Signed)
Tiffany Yu   DOB:May 02, 1955   IR#:[id number]    Subjective: She is feeling better.  She is able to tolerate nutritional supplement at 25 cc an hour without nausea She had good bowel movement recently  Objective:  Vitals:   05/31/16 2035 06/01/16 0446  BP: 129/71 100/60  Pulse: 89 86  Resp: 16   Temp: 98.7 F (37.1 C) 98 F (36.7 C)     Intake/Output Summary (Last 24 hours) at 06/01/16 1338 Last data filed at 06/01/16 0600  Gross per 24 hour  Intake             1200 ml  Output                0 ml  Net             1200 ml    GENERAL:alert, no distress and comfortable SKIN: skin color, texture, turgor are normal, no rashes or significant lesions EYES: normal, Conjunctiva are pink and non-injected, sclera clear Musculoskeletal:no cyanosis of digits and no clubbing  NEURO: alert & oriented x 3 with fluent speech, no focal motor/sensory deficits   Labs:  Lab Results  Component Value Date   WBC 3.6 (L) 05/29/2016   HGB 10.6 (L) 05/29/2016   HCT 30.2 (L) 05/29/2016   MCV 85.7 05/29/2016   PLT 150 05/29/2016   NEUTROABS 2.8 05/29/2016    Lab Results  Component Value Date   NA 141 06/01/2016   K 3.6 06/01/2016   CL 104 06/01/2016   CO2 31 06/01/2016    Assessment & Plan:  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[place]) She has completed recent chemoradiation treatment. She is still experiencing significant side effects She will continue aggressive supportive care  Cancer associated pain Her pain is better controlled I recommend slow taper of fentanyl from 75 mcg to 50 mcg the next dose change.  Continuous salivary secretion She will continue scopolamine patch for now  Severe uncontrolled nausea and vomiting with clinical signs of dehydration and excessive dizziness Severe constipation Likely exacerbated by severe constipation Continue IV fluids. We will give her magnesium citrate along with suppository for severe constipation  Weight loss with moderate  protein calorie malnutrition She was not able to use of feeding tube due to uncontrolled nausea and vomiting Abdominal x-ray showedno evidence of bowel obstruction She was started on tube feeding again on 05/29/16, will titrate up to goal to >50cc/hour  Severe hypomagnesemia, hypophosphatemia and hypokalemia, all signs of refeeding syndrome We will replace IV magnesium, IV potassium and IV phosphorus intravenously as needed She has classic signs of refeeding syndrome We will continue to monitor electrolytes carefully daily and replace as needed  Acquired pancytopenia This is due to recent treatment She is not symptomatic, no need transfusion Observe closely  DVT prophylaxis On Lovenox  CODE STATUS Full code  Discharge planning She is improving slowly but not safe for discharge I anticipate she will be here at least the next 3-4 days before she is safe for discharge due to signs of refeeding syndrome Tiffany Lark, MD 06/01/2016  1:38 PM

## 2016-06-02 LAB — GLUCOSE, CAPILLARY
GLUCOSE-CAPILLARY: 104 mg/dL — AB (ref 65–99)
GLUCOSE-CAPILLARY: 112 mg/dL — AB (ref 65–99)
GLUCOSE-CAPILLARY: 119 mg/dL — AB (ref 65–99)
GLUCOSE-CAPILLARY: 94 mg/dL (ref 65–99)
Glucose-Capillary: 117 mg/dL — ABNORMAL HIGH (ref 65–99)
Glucose-Capillary: 108 mg/dL — ABNORMAL HIGH (ref 65–99)

## 2016-06-02 LAB — CBC WITH DIFFERENTIAL/PLATELET
Basophils Absolute: 0 10*3/uL (ref 0.0–0.1)
Basophils Relative: 0 %
EOS PCT: 4 %
Eosinophils Absolute: 0.1 10*3/uL (ref 0.0–0.7)
HCT: 25.1 % — ABNORMAL LOW (ref 36.0–46.0)
HEMOGLOBIN: 9 g/dL — AB (ref 12.0–15.0)
LYMPHS ABS: 0.6 10*3/uL — AB (ref 0.7–4.0)
Lymphocytes Relative: 18 %
MCH: 29.4 pg (ref 26.0–34.0)
MCHC: 35.9 g/dL (ref 30.0–36.0)
MCV: 82 fL (ref 78.0–100.0)
Monocytes Absolute: 0.3 10*3/uL (ref 0.1–1.0)
Monocytes Relative: 10 %
NEUTROS PCT: 68 %
Neutro Abs: 2.3 10*3/uL (ref 1.7–7.7)
Platelets: 158 10*3/uL (ref 150–400)
RBC: 3.06 MIL/uL — AB (ref 3.87–5.11)
RDW: 19.8 % — ABNORMAL HIGH (ref 11.5–15.5)
WBC: 3.4 10*3/uL — AB (ref 4.0–10.5)

## 2016-06-02 LAB — PHOSPHORUS: Phosphorus: 3.3 mg/dL (ref 2.5–4.6)

## 2016-06-02 LAB — MAGNESIUM: Magnesium: 1.6 mg/dL — ABNORMAL LOW (ref 1.7–2.4)

## 2016-06-02 MED ORDER — MAGNESIUM SULFATE 2 GM/50ML IV SOLN
2.0000 g | Freq: Once | INTRAVENOUS | Status: AC
  Administered 2016-06-02: 2 g via INTRAVENOUS
  Filled 2016-06-02: qty 50

## 2016-06-02 MED ORDER — FENTANYL 25 MCG/HR TD PT72
25.0000 ug | MEDICATED_PATCH | TRANSDERMAL | Status: DC
Start: 2016-06-02 — End: 2016-06-04
  Administered 2016-06-02: 25 ug via TRANSDERMAL
  Filled 2016-06-02: qty 1

## 2016-06-02 NOTE — Progress Notes (Signed)
Increased tube feeding to 45 cc/hour at 1050 at 1350 pt. states she is unable to tolerate due to nausea. Turned TF back down to 35 cc/hour.Pt. had increase in nausea and some emesis. Medicated with antiemetic and MD notified. Per MD, rate changed to 40 cc/hour and MD to reassess in am. Pt. Tolerating the rate change at this time.

## 2016-06-02 NOTE — Progress Notes (Signed)
[place]   DOB:Oct 28, 1955   HU#:[id number]    Subjective: She is feeling a little better.  She had some mild nausea when the  rate of nutritional supplement was increased to 35 cc an hour.  She had some mild nausea but subsequently her nausea resolved.  She had bowel movement yesterday.  Objective:  Vitals:   06/01/16 2004 06/02/16 0413  BP: 117/70 117/71  Pulse: 91 85  Resp: 16 14  Temp: 98.8 F (37.1 C) 98 F (36.7 C)     Intake/Output Summary (Last 24 hours) at 06/02/16 4656 Last data filed at 06/02/16 0500  Gross per 24 hour  Intake          1326.25 ml  Output                0 ml  Net          1326.25 ml    GENERAL:alert, no distress and comfortable SKIN: skin color, texture, turgor are normal, no rashes or significant lesions EYES: normal, Conjunctiva are pink and non-injected, sclera clear Musculoskeletal:no cyanosis of digits and no clubbing  NEURO: alert & oriented x 3 with fluent speech, no focal motor/sensory deficits   Labs:  Lab Results  Component Value Date   WBC 3.4 (L) 06/02/2016   HGB 9.0 (L) 06/02/2016   HCT 25.1 (L) 06/02/2016   MCV 82.0 06/02/2016   PLT 158 06/02/2016   NEUTROABS 2.3 06/02/2016    Lab Results  Component Value Date   NA 141 06/01/2016   K 3.6 06/01/2016   CL 104 06/01/2016   CO2 31 06/01/2016    Assessment & Plan:   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[place]) She has completed recent chemoradiation treatment. She is still experiencing significant side effects She will continue aggressive supportive care  Cancer associated pain Her pain is better controlled I recommend slow taper of fentanyl from 50 mcg to 25 mcg the next dose change.  Continuous salivary secretion She will continue scopolamine patch for now  Severe uncontrolled nausea and vomiting with clinical signs of dehydration and excessive dizziness Severe constipation Likely exacerbated by severe constipation, resolved with magnesium citrate and  suppository Continue IV fluids.  Continue daily Miralax  Weight loss with moderate protein calorie malnutrition She was not able to use of feeding tube due to uncontrolled nausea and vomiting Abdominal x-ray showedno evidence of bowel obstruction She was started on tube feeding again on 05/29/16, will titrate up to goal to >50cc/hour  Severe hypomagnesemia, hypophosphatemiaand hypokalemia, all signs of refeeding syndrome We will replace IV magnesium, IV potassium and IV phosphorus intravenously as needed She has classic signs of refeeding syndrome We will continue to monitor electrolytes carefully daily and replace as needed  Acquired pancytopenia This is due to recent treatment She is not symptomatic, no need transfusion Observe closely  DVT prophylaxis On Lovenox  CODE STATUS Full code  Discharge planning She is improving slowly but not safe for discharge I anticipate she will be here at least the next 3-4 days before she is safe for discharge due to signs of refeeding syndrome  [name], MD 06/02/2016  8:08 AM

## 2016-06-03 DIAGNOSIS — R1310 Dysphagia, unspecified: Secondary | ICD-10-CM

## 2016-06-03 LAB — GLUCOSE, CAPILLARY
GLUCOSE-CAPILLARY: 109 mg/dL — AB (ref 65–99)
GLUCOSE-CAPILLARY: 105 mg/dL — AB (ref 65–99)
GLUCOSE-CAPILLARY: 89 mg/dL (ref 65–99)
Glucose-Capillary: 106 mg/dL — ABNORMAL HIGH (ref 65–99)
Glucose-Capillary: 111 mg/dL — ABNORMAL HIGH (ref 65–99)
Glucose-Capillary: 120 mg/dL — ABNORMAL HIGH (ref 65–99)

## 2016-06-03 LAB — BASIC METABOLIC PANEL
Anion gap: 4 — ABNORMAL LOW (ref 5–15)
BUN: 13 mg/dL (ref 6–20)
CHLORIDE: 105 mmol/L (ref 101–111)
CO2: 31 mmol/L (ref 22–32)
CREATININE: 0.96 mg/dL (ref 0.44–1.00)
Calcium: 9.1 mg/dL (ref 8.9–10.3)
Glucose, Bld: 98 mg/dL (ref 65–99)
Potassium: 4.3 mmol/L (ref 3.5–5.1)
Sodium: 140 mmol/L (ref 135–145)

## 2016-06-03 LAB — MAGNESIUM: MAGNESIUM: 1.9 mg/dL (ref 1.7–2.4)

## 2016-06-03 NOTE — Progress Notes (Signed)
[place]   DOB:1955/06/02   ZD#:[id number]    Subjective: She could not tolerate increase feeding yesterday.  Currently, she is at 40 cc an hour.  The dose of fentanyl was reduced. She has significant gagging and cannot swallow much by mouth.  She denies constipation.  Objective:  Vitals:   06/02/16 2028 06/03/16 0500  BP: 117/75 120/72  Pulse: 92 90  Resp: 16 14  Temp: 98.3 F (36.8 C) 97.9 F (36.6 C)     Intake/Output Summary (Last 24 hours) at 06/03/16 1036 Last data filed at 06/03/16 0645  Gross per 24 hour  Intake             1580 ml  Output                0 ml  Net             1580 ml    GENERAL:alert, no distress and comfortable SKIN: skin color, texture, turgor are normal, no rashes or significant lesions EYES: normal, Conjunctiva are pink and non-injected, sclera clear Musculoskeletal:no cyanosis of digits and no clubbing  NEURO: alert & oriented x 3 with fluent speech, no focal motor/sensory deficits   Labs:  Lab Results  Component Value Date   WBC 3.4 (L) 06/02/2016   HGB 9.0 (L) 06/02/2016   HCT 25.1 (L) 06/02/2016   MCV 82.0 06/02/2016   PLT 158 06/02/2016   NEUTROABS 2.3 06/02/2016    Lab Results  Component Value Date   NA 140 06/03/2016   K 4.3 06/03/2016   CL 105 06/03/2016   CO2 31 06/03/2016    Assessment & Plan:   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[place]) She has completed recent chemoradiation treatment. She is still experiencing significant side effects She will continue aggressive supportive care  Cancer associated pain Her pain is better controlled I recommend slow taper of fentanyl from 25 mcg to 12 mcg the next dose change.  Continuous salivary secretion She will continue scopolamine patch for now  Severe uncontrolled nausea and vomiting with clinical signs of dehydration and excessive dizziness Severe constipation Likely exacerbated by severe constipation, resolved with magnesium citrate and suppository Continue IV  fluids.  Continue daily Miralax  Weight loss with moderate protein calorie malnutrition She was not able to use of feeding tube due to uncontrolled nausea and vomiting Abdominal x-ray showedno evidence of bowel obstruction She was started on tube feeding again on 05/29/16, will titrate up to goal to >50cc/hour  Severe hypomagnesemia, hypophosphatemiaand hypokalemia, all signs of refeeding syndrome We will replace IV magnesium, IV potassium and IV phosphorus intravenously as needed She has classic signs of refeeding syndrome We will continue to monitor electrolytes carefully daily and replace as needed  Acquired pancytopenia This is due to recent treatment She is not symptomatic, no need transfusion Observe closely  Dysphagia I will order a modified barium swallow and speech and language therapist evaluation for further assessment  DVT prophylaxis On Lovenox  CODE STATUS Full code  Discharge planning She is improving slowly but not safe for discharge I anticipate she will be here at least the next 3-4 days before she is safe for discharge due to signs of refeeding syndrome   [name], MD 06/03/2016  10:36 AM

## 2016-06-04 ENCOUNTER — Inpatient Hospital Stay (HOSPITAL_COMMUNITY): Payer: 59

## 2016-06-04 LAB — GLUCOSE, CAPILLARY
GLUCOSE-CAPILLARY: 105 mg/dL — AB (ref 65–99)
Glucose-Capillary: 98 mg/dL (ref 65–99)
Glucose-Capillary: 94 mg/dL (ref 65–99)
Glucose-Capillary: 105 mg/dL — ABNORMAL HIGH (ref 65–99)

## 2016-06-04 MED ORDER — HEPARIN SOD (PORK) LOCK FLUSH 100 UNIT/ML IV SOLN
500.0000 [IU] | Freq: Once | INTRAVENOUS | Status: AC
  Administered 2016-06-04: 500 [IU] via INTRAVENOUS
  Filled 2016-06-04: qty 5

## 2016-06-04 NOTE — Progress Notes (Signed)
Nutrition Follow-up  DOCUMENTATION CODES:   Severe malnutrition in context of chronic illness  INTERVENTION:   Continue Osmolite 1.5 @ 60 ml/hr. Can advance to goal rate of 65 ml/hr once home. Continue 30 ml Prostat BID. Goal rate provides 2540 kcal (100% of needs), 127g protein (101% of needs), and 1188 ml H2O.   Continue liquid MVI per tube.  RD to continue to monitor  NUTRITION DIAGNOSIS:   Malnutrition(severe) related to chronic illness, cancer and cancer related treatments as evidenced by percent weight loss, energy intake < or equal to 75% for > or equal to 1 month.  Ongoing.  GOAL:   Patient will meet greater than or equal to 90% of their needs  Progressing.  MONITOR:   PO intake, Labs, Weight trends, TF tolerance, I & O's  ASSESSMENT:   61 y.o. female is admitted for management of above symptoms: Severe dehydration, dizziness, uncontrolled nausea and vomiting.  Pt reports no nausea at this time and currently tolerating Osmolite 1.5 @ 60 ml/hr. Pt to discharge home today. Pt with no questions for RD at this time.  Results of MBS pending but pt reports she was told not to use straws, to take small sips of liquids and small bites of food.   Lab reviewed. Medications: Liquid MVI daily, Miralax packet daily, Senokot tablet BID, Zofran PRN  Diet Order:  Diet regular Room service appropriate? Yes; Fluid consistency: Thin  Skin:  Reviewed, no issues  Last BM:  6/4  Height:   Ht Readings from Last 1 Encounters:  05/29/16 5\' 7"  (1.702 m)    Weight:   Wt Readings from Last 1 Encounters:  06/04/16 197 lb 15.6 oz (89.8 kg)    Ideal Body Weight:  61.4 kg  BMI:  Body mass index is 31.01 kg/m.  Estimated Nutritional Needs:   Kcal:  8756-4332  Protein:  125-135g  Fluid:  2.5L/day  EDUCATION NEEDS:   Education needs addressed  Clayton Bibles, MS, RD, LDN Pager: [phone] After Hours Pager: [phone]

## 2016-06-04 NOTE — Discharge Summary (Signed)
Physician Discharge Summary  Patient ID: Tiffany Yu MRN: [id number] DOB/AGE: 1955-04-09 61 y.o.  Admit date: 05/29/2016 Discharge date: 06/04/2016  Primary Care Physician:  Bernerd Limbo, MD   Discharge Diagnoses:    Present on Admission: . 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[place]) . Cancer associated pain . Weight loss . Constipation . Nausea with vomiting . Continuous salivary secretion . Hypomagnesemia . Nausea & vomiting . Tongue cancer [facility])   Discharge Medications:  Allergies as of 06/04/2016   No Known Allergies     Medication List    STOP taking these medications   cephALEXin 250 MG/5ML suspension Commonly known as:  KEFLEX   fentaNYL 75 MCG/HR Commonly known as:  DURAGESIC - dosed mcg/hr   HYDROcodone-acetaminophen 7.5-325 mg/15 ml solution Commonly known as:  HYCET   lidocaine 2 % solution Commonly known as:  XYLOCAINE   lidocaine 5 % ointment Commonly known as:  XYLOCAINE   ondansetron 8 MG tablet Commonly known as:  ZOFRAN   triamcinolone 0.1 % paste Commonly known as:  KENALOG   zolpidem 10 MG tablet Commonly known as:  AMBIEN     TAKE these medications   chlorhexidine 0.12 % solution Commonly known as:  PERIDEX Rinse with 15 mls three times daily for 30 seconds. Use after breakfast, dinner, and at bedtime. Spit out excess. Do not swallow.   feeding supplement (OSMOLITE 1.5 CAL) Liqd Begin Osmolite 1.5 at 60 mL/hr via PEG and increase 10 mL every 4 hours to goal rate of 120 mL/hr for 14 hours daily from 6pm until 8 am. Flush with 120 mL water before and after continuous feeding. Flush with additional 240 mL water QID.   gabapentin 300 MG capsule Commonly known as:  NEURONTIN Take 1 capsule (300 mg total) by mouth 2 (two) times daily as needed.   levothyroxine 75 MCG tablet Commonly known as:  SYNTHROID, LEVOTHROID Take 75 mcg by mouth daily before breakfast.   lidocaine-prilocaine cream Commonly known as:  EMLA Apply  to affected area once   LORazepam 1 MG tablet Commonly known as:  ATIVAN Take 1 tablet (1 mg total) by mouth every 8 (eight) hours as needed for anxiety or sleep (nausea).   metoCLOPramide 10 MG tablet Commonly known as:  REGLAN Take 1 tablet (10 mg total) by mouth 4 (four) times daily -  before meals and at bedtime.   morphine 20 MG/ML concentrated solution Commonly known as:  ROXANOL Take 1 mL (20 mg total) by mouth every 2 (two) hours as needed for severe pain. What changed:  Another medication with the same name was removed. Continue taking this medication, and follow the directions you see here.   ondansetron 8 MG disintegrating tablet Commonly known as:  ZOFRAN ODT Take 1 tablet (8 mg total) by mouth every 8 (eight) hours as needed for nausea or vomiting.   prochlorperazine 10 MG tablet Commonly known as:  COMPAZINE TAKE 1 TABLET BY MOUTH EVERY 6 HOURS AS NEEDED FOR NAUSEA AND VOMITING   scopolamine 1 MG/3DAYS Commonly known as:  TRANSDERM-SCOP Place 1 patch (1.5 mg total) onto the skin every 3 (three) days.   senna 8.6 MG tablet Commonly known as:  SENOKOT Take 1 tablet by mouth 2 (two) times daily.       Disposition and Follow-up:   Significant Diagnostic Studies:  Dg Chest 2 View  Result Date: 05/29/2016 CLINICAL DATA:  Head and neck cancer. EXAM: CHEST  2 VIEW COMPARISON:  CT 03/02/2016 .  FINDINGS: PowerPort catheter with lead tip projected over superior vena cava. Cardiomegaly with normal pulmonary vascularity. No focal infiltrate. No pleural effusion or pneumothorax. Thoracic spine scoliosis. IMPRESSION: 1. PowerPort catheter noted with lead tip projected over superior vena cava. 2. No acute cardiopulmonary disease. Electronically Signed   By: Marcello Moores  Register   On: 05/29/2016 13:11   Dg Abd 2 Views  Result Date: 05/29/2016 CLINICAL DATA:  Head neck cancer.  G-tube placement. EXAM: ABDOMEN - 2 VIEW COMPARISON:  03/13/2016 . FINDINGS: Surgical clips noted over  the upper abdomen and lower pelvis. G-tube noted with its tip over the stomach. No gastric distention. No bowel distention or free air. No free air. IMPRESSION: G-tube noted over the stomach. No evidence of gastric or bowel distention. Electronically Signed   By: Marcello Moores  Register   On: 05/29/2016 13:09    Discharge Laboratory Values: Lab Results  Component Value Date   WBC 3.4 (L) 06/02/2016   HGB 9.0 (L) 06/02/2016   HCT 25.1 (L) 06/02/2016   MCV 82.0 06/02/2016   PLT 158 06/02/2016   Lab Results  Component Value Date   NA 140 06/03/2016   K 4.3 06/03/2016   CL 105 06/03/2016   CO2 31 06/03/2016    Brief H and P: For complete details please refer to admission H and P, but in brief, she was admitted for symptoms of severe dehydration, dizziness, uncontrolled nausea and vomiting  Physical Exam at Discharge: BP 130/69 (BP Location: Right Arm)   Pulse (!) 103   Temp 99.6 F (37.6 C) (Oral)   Resp 16   Ht 5\' 7"  (1.702 m)   Wt 197 lb 15.6 oz (89.8 kg)   SpO2 100%   BMI 31.01 kg/m  GENERAL:alert, no distress and comfortable SKIN: skin color, texture, turgor are normal, no rashes or significant lesions EYES: normal, Conjunctiva are pink and non-injected, sclera clear OROPHARYNX:no exudate, no erythema and lips, buccal mucosa, and tongue normal  NECK: supple, thyroid normal size, non-tender, without nodularity LYMPH:  no palpable lymphadenopathy in the cervical, axillary or inguinal LUNGS: clear to auscultation and percussion with normal breathing effort HEART: regular rate & rhythm and no murmurs and no lower extremity edema ABDOMEN:abdomen soft, non-tender and normal bowel sounds Musculoskeletal:no cyanosis of digits and no clubbing  NEURO: alert & oriented x 3 with fluent speech, no focal motor/sensory deficits  Hospital Course:  Active Problems:   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[facility])   Cancer associated pain   Weight loss   Constipation   Nausea with  vomiting   Continuous salivary secretion   Hypomagnesemia   Tongue cancer ([facility])   Nausea & vomiting  Cancer of anterior two-thirds of tongue [facility]) She has completed recent chemoradiation treatment. She is still experiencing significant side effects She will continue aggressive supportive care  Cancer associated pain Her pain is better controlled, resolved despite rapid taper I recommend stopping fentanyl upon discharge  Continuous salivary secretion She will continue scopolamine patch for now  Severe uncontrolled nausea and vomiting with clinical signs of dehydration and excessive dizziness resolved Severe constipation, resolved Likely exacerbated by severe constipation, resolved with magnesium citrate and suppository Continue daily Miralax  Weight loss with moderate protein calorie malnutrition She was not able to use of feeding tube due to uncontrolled nausea and vomiting Abdominal x-ray showedno evidence of bowel obstruction She was started on tube feeding again on 05/29/16, will titrate up to goal to at 60cc/hour She will continue tube feeding at  home  Severe hypomagnesemia, hypophosphatemiaand hypokalemia, all signs of refeeding syndrome We will replace IV magnesium, IV potassium and IV phosphorus intravenously as needed She has classic signs of refeeding syndrome We will continue to monitor electrolytes carefully daily and replace as needed  Acquired pancytopenia This is due to recent treatment She is not symptomatic, no need transfusion Observe closely  Dysphagia I will order a modified barium swallow and speech and language therapist evaluation for further assessment She is able to swallow food with further direction  DVT prophylaxis On Lovenox  CODE STATUS Full code Diet:  Regular  Activity:  As tolerated  Condition at Discharge:   stable  Signed: Dr. [name] [phone]  06/04/2016, 12:50 PM

## 2016-06-04 NOTE — Progress Notes (Signed)
Patient verbalized understanding of discharge instructions,

## 2016-06-06 ENCOUNTER — Encounter (HOSPITAL_COMMUNITY): Payer: Self-pay | Admitting: *Deleted

## 2016-06-06 ENCOUNTER — Encounter: Payer: 59 | Admitting: Hematology and Oncology

## 2016-06-06 ENCOUNTER — Inpatient Hospital Stay (HOSPITAL_COMMUNITY)
Admission: AD | Admit: 2016-06-06 | Discharge: 2016-06-13 | DRG: 640 | Disposition: A | Discharge status: Home / Self Care | Payer: 59 | Source: Ambulatory Visit | Attending: Hematology and Oncology | Admitting: Hematology and Oncology

## 2016-06-06 DIAGNOSIS — R05 Cough: Secondary | ICD-10-CM | POA: Diagnosis not present

## 2016-06-06 DIAGNOSIS — Z96651 Presence of right artificial knee joint: Secondary | ICD-10-CM | POA: Diagnosis not present

## 2016-06-06 DIAGNOSIS — Z8249 Family history of ischemic heart disease and other diseases of the circulatory system: Secondary | ICD-10-CM | POA: Diagnosis not present

## 2016-06-06 DIAGNOSIS — F329 Major depressive disorder, single episode, unspecified: Secondary | ICD-10-CM | POA: Diagnosis present

## 2016-06-06 DIAGNOSIS — R112 Nausea with vomiting, unspecified: Secondary | ICD-10-CM | POA: Diagnosis present

## 2016-06-06 DIAGNOSIS — Z86010 Personal history of colonic polyps: Secondary | ICD-10-CM | POA: Diagnosis not present

## 2016-06-06 DIAGNOSIS — G893 Neoplasm related pain (acute) (chronic): Secondary | ICD-10-CM | POA: Diagnosis present

## 2016-06-06 DIAGNOSIS — E860 Dehydration: Principal | ICD-10-CM | POA: Diagnosis present

## 2016-06-06 DIAGNOSIS — R0981 Nasal congestion: Secondary | ICD-10-CM

## 2016-06-06 DIAGNOSIS — E440 Moderate protein-calorie malnutrition: Secondary | ICD-10-CM | POA: Diagnosis present

## 2016-06-06 DIAGNOSIS — R000 Tachycardia, unspecified: Secondary | ICD-10-CM | POA: Diagnosis not present

## 2016-06-06 DIAGNOSIS — F331 Major depressive disorder, recurrent, moderate: Secondary | ICD-10-CM

## 2016-06-06 DIAGNOSIS — E039 Hypothyroidism, unspecified: Secondary | ICD-10-CM | POA: Diagnosis present

## 2016-06-06 DIAGNOSIS — F1099 Alcohol use, unspecified with unspecified alcohol-induced disorder: Secondary | ICD-10-CM | POA: Diagnosis not present

## 2016-06-06 DIAGNOSIS — R110 Nausea: Secondary | ICD-10-CM | POA: Diagnosis present

## 2016-06-06 DIAGNOSIS — I10 Essential (primary) hypertension: Secondary | ICD-10-CM | POA: Diagnosis present

## 2016-06-06 DIAGNOSIS — D61810 Antineoplastic chemotherapy induced pancytopenia: Secondary | ICD-10-CM | POA: Diagnosis present

## 2016-06-06 DIAGNOSIS — C029 Malignant neoplasm of tongue, unspecified: Secondary | ICD-10-CM

## 2016-06-06 DIAGNOSIS — F0631 Mood disorder due to known physiological condition with depressive features: Secondary | ICD-10-CM | POA: Diagnosis not present

## 2016-06-06 DIAGNOSIS — Z931 Gastrostomy status: Secondary | ICD-10-CM | POA: Diagnosis not present

## 2016-06-06 DIAGNOSIS — R1310 Dysphagia, unspecified: Secondary | ICD-10-CM | POA: Diagnosis present

## 2016-06-06 DIAGNOSIS — R42 Dizziness and giddiness: Secondary | ICD-10-CM

## 2016-06-06 DIAGNOSIS — K117 Disturbances of salivary secretion: Secondary | ICD-10-CM | POA: Diagnosis present

## 2016-06-06 DIAGNOSIS — Z6828 Body mass index (BMI) 28.0-28.9, adult: Secondary | ICD-10-CM | POA: Diagnosis not present

## 2016-06-06 DIAGNOSIS — T451X5A Adverse effect of antineoplastic and immunosuppressive drugs, initial encounter: Secondary | ICD-10-CM | POA: Diagnosis present

## 2016-06-06 DIAGNOSIS — Z923 Personal history of irradiation: Secondary | ICD-10-CM

## 2016-06-06 DIAGNOSIS — R45851 Suicidal ideations: Secondary | ICD-10-CM | POA: Diagnosis not present

## 2016-06-06 DIAGNOSIS — R630 Anorexia: Secondary | ICD-10-CM | POA: Diagnosis not present

## 2016-06-06 DIAGNOSIS — E785 Hyperlipidemia, unspecified: Secondary | ICD-10-CM | POA: Diagnosis present

## 2016-06-06 DIAGNOSIS — C023 Malignant neoplasm of anterior two-thirds of tongue, part unspecified: Secondary | ICD-10-CM | POA: Diagnosis present

## 2016-06-06 DIAGNOSIS — K5900 Constipation, unspecified: Secondary | ICD-10-CM | POA: Diagnosis not present

## 2016-06-06 DIAGNOSIS — B370 Candidal stomatitis: Secondary | ICD-10-CM

## 2016-06-06 DIAGNOSIS — R059 Cough, unspecified: Secondary | ICD-10-CM

## 2016-06-06 MED ORDER — LORAZEPAM 1 MG PO TABS: 1.0000 mg | ORAL_TABLET | Freq: Three times a day (TID) | ORAL | Status: DC | PRN

## 2016-06-06 MED ORDER — ONDANSETRON HCL 4 MG PO TABS: 4.0000 mg | ORAL_TABLET | Freq: Three times a day (TID) | ORAL | Status: DC | PRN

## 2016-06-06 MED ORDER — SENNOSIDES-DOCUSATE SODIUM 8.6-50 MG PO TABS: 1.0000 | ORAL_TABLET | Freq: Every evening | ORAL | Status: DC | PRN

## 2016-06-06 MED ORDER — ONDANSETRON HCL 4 MG/2ML IJ SOLN
4.0000 mg | Freq: Three times a day (TID) | INTRAMUSCULAR | Status: DC | PRN
  Administered 2016-06-06 – 2016-06-11 (×9): 4 mg via INTRAVENOUS
  Filled 2016-06-06 (×10): qty 2

## 2016-06-06 MED ORDER — LIDOCAINE-PRILOCAINE 2.5-2.5 % EX CREA
TOPICAL_CREAM | Freq: Once | CUTANEOUS | Status: AC
  Administered 2016-06-06: 17:00:00 via TOPICAL
  Filled 2016-06-06: qty 5

## 2016-06-06 MED ORDER — ENSURE ENLIVE PO LIQD
237.0000 mL | Freq: Two times a day (BID) | ORAL | Status: DC
  Administered 2016-06-07 – 2016-06-11 (×3): 237 mL via ORAL

## 2016-06-06 MED ORDER — PROCHLORPERAZINE EDISYLATE 5 MG/ML IJ SOLN
10.0000 mg | Freq: Four times a day (QID) | INTRAMUSCULAR | Status: DC | PRN
  Administered 2016-06-09 – 2016-06-12 (×3): 10 mg via INTRAVENOUS
  Filled 2016-06-06 (×3): qty 2

## 2016-06-06 MED ORDER — SCOPOLAMINE 1 MG/3DAYS TD PT72
1.0000 | MEDICATED_PATCH | TRANSDERMAL | Status: DC
  Administered 2016-06-09 – 2016-06-12 (×2): 1.5 mg via TRANSDERMAL
  Filled 2016-06-06 (×3): qty 1

## 2016-06-06 MED ORDER — PROCHLORPERAZINE MALEATE 10 MG PO TABS: 10.0000 mg | ORAL_TABLET | Freq: Four times a day (QID) | ORAL | Status: DC | PRN

## 2016-06-06 MED ORDER — METOCLOPRAMIDE HCL 10 MG PO TABS
10.0000 mg | ORAL_TABLET | Freq: Three times a day (TID) | ORAL | Status: DC
  Administered 2016-06-06 – 2016-06-12 (×21): 10 mg via ORAL
  Filled 2016-06-06 (×21): qty 1

## 2016-06-06 MED ORDER — LEVOTHYROXINE SODIUM 25 MCG PO TABS
75.0000 ug | ORAL_TABLET | Freq: Every day | ORAL | Status: DC
  Administered 2016-06-07 – 2016-06-12 (×6): 75 ug via ORAL
  Filled 2016-06-06 (×6): qty 1

## 2016-06-06 MED ORDER — POTASSIUM CHLORIDE IN NACL 20-0.9 MEQ/L-% IV SOLN
INTRAVENOUS | Status: DC
  Administered 2016-06-06: 1000 mL via INTRAVENOUS
  Administered 2016-06-07 – 2016-06-12 (×8): via INTRAVENOUS
  Filled 2016-06-06 (×13): qty 1000

## 2016-06-06 MED ORDER — SENNA 8.6 MG PO TABS
1.0000 | ORAL_TABLET | Freq: Two times a day (BID) | ORAL | Status: DC
  Administered 2016-06-08 – 2016-06-11 (×5): 8.6 mg via ORAL
  Filled 2016-06-06 (×8): qty 1

## 2016-06-06 MED ORDER — ENOXAPARIN SODIUM 40 MG/0.4ML ~~LOC~~ SOLN
40.0000 mg | SUBCUTANEOUS | Status: DC
  Administered 2016-06-06 – 2016-06-12 (×7): 40 mg via SUBCUTANEOUS
  Filled 2016-06-06 (×7): qty 0.4

## 2016-06-06 MED ORDER — LIDOCAINE-PRILOCAINE 2.5-2.5 % EX CREA: TOPICAL_CREAM | Freq: Once | CUTANEOUS | Status: DC

## 2016-06-06 MED ORDER — ACETAMINOPHEN 325 MG PO TABS: 650.0000 mg | ORAL_TABLET | ORAL | Status: DC | PRN

## 2016-06-06 MED ORDER — ONDANSETRON 4 MG PO TBDP
4.0000 mg | ORAL_TABLET | Freq: Three times a day (TID) | ORAL | Status: DC | PRN
  Filled 2016-06-06: qty 2
  Filled 2016-06-06: qty 1

## 2016-06-06 MED ORDER — ZOLPIDEM TARTRATE 5 MG PO TABS
5.0000 mg | ORAL_TABLET | Freq: Every day | ORAL | Status: DC
Start: 2016-06-06 — End: 2016-06-12
  Administered 2016-06-06 – 2016-06-11 (×6): 5 mg via ORAL
  Filled 2016-06-06 (×6): qty 1

## 2016-06-06 MED ORDER — HYDROMORPHONE HCL 1 MG/ML IJ SOLN
2.0000 mg | INTRAMUSCULAR | Status: DC | PRN
  Administered 2016-06-06 – 2016-06-09 (×9): 2 mg via INTRAVENOUS
  Filled 2016-06-06 (×10): qty 2

## 2016-06-06 MED ORDER — SODIUM CHLORIDE 0.9 % IV SOLN
8.0000 mg | Freq: Three times a day (TID) | INTRAVENOUS | Status: DC | PRN
  Filled 2016-06-06: qty 4

## 2016-06-06 MED ORDER — SODIUM CHLORIDE 0.9 % IV SOLN
Freq: Once | INTRAVENOUS | Status: AC
  Administered 2016-06-06: 18:00:00 via INTRAVENOUS

## 2016-06-06 NOTE — Progress Notes (Signed)
Pt states that she was thinking about harming herself yesterday. I asked her if she is still thinking about that today. She states no. She says she would like to see an elder of the [employer] faith. Order put into chaplain service to send an elder from [employer] faith to see her. Explained all of this to patient's rn Aldean Baker RN who is going to follow up with chaplain services and she will also reassess patient for possible suicide precautions. Lucius Conn BSN, RN-BC Admissions RN 06/06/2016 4:20 PM

## 2016-06-06 NOTE — H&P (Signed)
[name] NOTE  Patient Care Team: Bernerd Limbo, MD as PCP - General (Family Medicine) Jerrell Belfast, MD as Consulting Physician (Otolaryngology) Eppie Gibson, MD as Attending Physician (Radiation Oncology) [name], MD as Consulting Physician (Hematology and Oncology) Leota Sauers, RN as Oncology Nurse Navigator (Oncology)  CHIEF COMPLAINTS/PURPOSE OF ADMISSION Uncontrolled nausea and vomiting, severe dehydration  HISTORY OF PRESENTING ILLNESS:  Tiffany Yu 61 y.o. female is admitted for uncontrolled nausea and vomiting and severe dehydration, inability to tolerate nutritional feeding at home. This patient was just discharged from the hospital 2 days ago. She was admitted at that time for similar reasons. Since discharge from hospital, she was only tolerating nutritional supplement for 4 hours before she have uncontrolled nausea vomiting. Today, she was not able to eat or drink anything by mouth. Upon evaluation in the outpatient clinic, she was noted to be profoundly dehydrated.  The patient cannot tolerate any form of oral intake.  She is not able to come into the outpatient clinic for IV fluid hydration and requested to be admitted for further management. She denies mucositis pain.  Denies recent constipation.  Summary of oncologic history as follows:   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[place])   02/08/2016 Pathology Results    Diagnosis 1. 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 Tongue, biopsy, Anterior deep margin left SQUAMOUS CELL CARCINOMA 3. Tongue, biopsy, Posterior deep margin left NEGATIVE FOR CARCINOMA 4. Tongue, biopsy, Posterior margin left SQUAMOUS CELL CARCINOMA 5. Tongue, excisional  biopsy, New anterior deep margin NEGATIVE FOR CARCINOMA 6. Tongue, excisional biopsy, New posterior deep margin SMALL FOCUS OF SQUAMOUS CELL CARCINOMA (0.1 CM, ONLY PRESENT AT THE DEEPER PERMINENT SECTION) 7. Tongue, resection for tumor, Left lateral INFILTRATIVE KERATINIZED SQUAMOUS CELL CARCINOMA (3.2 CM) THE TUMOR INVADES UNDERNEATH MUSCLE OF THE TONGUE (2.0 CM, PT2) LYMPHOVASCULAR AND PERINEURAL INVASION IDENTIFIED SQUAMOUS CELL CARCINOMA PRESENTED AT (FINAL MARGINS REFER TO PART 2-6) LATERAL INKED MARGIN (1.0 CM) MEDIAL INKED MARGIN (0.6 CM)      02/08/2016 Surgery    SURGICAL PROCEDURES: 1. Left hemiglossectomy with primary closure. 2. Left selective neck dissection (zones I-III).      03/02/2016 Imaging    CT chest with contrast: No evidence of metastatic disease in the chest. 2. Aortic atherosclerosis.       03/02/2016 Imaging    CT neck with contrast showed : Newly enlarged and round 10 mm right level 1b lymph node (series 5, image 45). The level 1A nodes remain normal. This right 1 B node is nonspecific but suspicious for progressive nodal metastatic disease. It might be amenable to Ultrasound-guided FNA. 2. Interval left hemiglossectomy and selective left neck dissection with confluent indeterminate soft tissue from the anterior left submandibular space through the left carotid space and obscuring the left level 1B, level 2, and level IIIa nodal stations. This study will serve as a new postoperative baseline of this area. Attention directed on close interval follow-up. 3. Mild soft tissue thickening also along the left lateral tongue resection margin. Attention directed on followup. 4. Chest CT today reported separately      03/14/2016 Procedure    She has normal baseline hearing test      03/15/2016 Procedure    1. Successful placement of a right  internal jugular approach power injectable Port-A-Cath. The Port a catheter is ready for immediate use. 2. Successful fluoroscopic  insertion of a 20-French pull-through gastrostomy tube. The gastrostomy may be used immediately for medication administration and may be utilized in 24 hrs for the initiation of feeds.      03/15/2016 - 04/30/2016 Radiation Therapy    Tongue and Bilateral Neck treated to 68 Gy in 33 fractions      03/16/2016 - 04/27/2016 Chemotherapy    She received weekly cisplatin       04/04/2016 Adverse Reaction    Chemotherapy is placed on hold due to acute renal failure      04/13/2016 Adverse Reaction    Chemo was placed on hold due to shingles outbreak and paronychia      MEDICAL HISTORY:  Past Medical History:  Diagnosis Date  . Anxiety   . Arthritis   . Depression    takes ZYban daily  . History of colon polyps    benign  . History of radiation therapy 03/15/16- 04/30/16   Tongue and bilateral neck 68 Gy in 33 fractions  . Hyperlipidemia    takes Atorvastatin daily  . Hypertension    takes HCTZ daily  . Hypomagnesemia 05/04/2016  . Hypothyroidism    takes Synthroid daily  . Insomnia    takes Ambien nightly and Xanax as needed  . Refusal of blood transfusions as patient is Jehov[employer]'s Witness     SURGICAL HISTORY: Past Surgical History:  Procedure Laterality Date  . CHOLECYSTECTOMY    . COLONOSCOPY    . DILATION AND CURETTAGE OF UTERUS    . EXCISION OF TONGUE LESION Left 02/08/2016   Procedure: WIDE LOCAL EXCISION OF LEFT LATERAL TONGUE;  Surgeon: Jerrell Belfast, MD;  Location: [place];  Service: ENT;  Laterality: Left;  . FEMUR IM NAIL Right 02/28/2012   Procedure: OPEN REDUCTION INTERNAL FIXATION (ORIF) RIGHT KNEE;  Surgeon: Alta Corning, MD;  Location: [place];  Service: Orthopedics;  Laterality: Right;  . IR GENERIC HISTORICAL  03/15/2016   IR FLUORO GUIDE PORT INSERTION RIGHT 03/15/2016 Sandi Mariscal, MD [facility]-INTERV RAD  . IR GENERIC HISTORICAL  03/15/2016   IR US GUIDE VASC ACCESS RIGHT 03/15/2016 Sandi Mariscal, MD [facility]  . IR GENERIC HISTORICAL  03/15/2016   IR GASTROSTOMY TUBE MOD  SED 03/15/2016 [facility]  . KNEE ARTHROPLASTY  12/14/2011   Procedure: COMPUTER ASSISTED TOTAL KNEE ARTHROPLASTY;  Surgeon: Alta Corning, MD;  Location: [place] [facility];  Service: Orthopedics;  Laterality: Right;  . KNEE ARTHROSCOPY     Right  . PLANTAR FASCIA SURGERY     right foot  . RADICAL NECK DISSECTION Left 02/08/2016   Procedure: LEFT SELECTIVE RADICAL NECK DISSECTION;  Surgeon: Jerrell Belfast, MD;  Location: [place];  Service: ENT;  Laterality: Left;  . TENDON RELEASE     right wrist  . TOTAL KNEE ARTHROPLASTY  12/14/2011   right knee    SOCIAL HISTORY: Social History   Social History  . Marital status: Married    Spouse name: Ebony Hail  . Number of children: 2  . Years of education: College   Occupational History  . CUSTOMER SERVICE REP  At [employer]   Social History Main Topics  . Smoking status: Never Smoker  . Smokeless tobacco: Never Used  . Alcohol use Yes     Comment: very rarely  . Drug use: No  . Sexual activity: Not on file   Other Topics Concern  .  Not on file   Social History Narrative   Patient is married Ebony Hail) and lives at home with her husband.   Patient has a college education.   Patient drinks two sodas per day.   Patient has two children.   Patient is right-handed.          FAMILY HISTORY: Family History  Problem Relation Age of Onset  . Anemia Mother   . Hypertension Mother   . Heart attack Father     ALLERGIES:  has No Known Allergies.  MEDICATIONS:  Current Facility-Administered Medications  Medication Dose Route Frequency Provider Last Rate Last Dose  . 0.9 %  sodium chloride infusion   Intravenous Once Malayla Granberry, MD      . 0.9 % NaCl with KCl 20 mEq/ L  infusion   Intravenous Continuous Alvy Bimler, Rigby Swamy, MD      . acetaminophen (TYLENOL) tablet 650 mg  650 mg Oral Q4H PRN , Alee Katen, MD      . enoxaparin (LOVENOX) injection 40 mg  40 mg Subcutaneous Q24H Alphonsa Brickle, MD      . HYDROmorphone (DILAUDID) injection 2 mg  2 mg Intravenous  Q2H PRN [name], MD      . Derrill Memo ON 06/07/2016] levothyroxine (SYNTHROID, LEVOTHROID) tablet 75 mcg  75 mcg Oral QAC breakfast Alvy Bimler, Graeme Menees, MD      . lidocaine-prilocaine (EMLA) cream   Topical Once Alvy Bimler, Requan Hardge, MD      . LORazepam (ATIVAN) tablet 1 mg  1 mg Oral Q8H PRN Alvy Bimler, Gryffin Altice, MD      . metoCLOPramide (REGLAN) tablet 10 mg  10 mg Oral TID AC & HS Haroon Shatto, MD      . ondansetron (ZOFRAN) tablet 4-8 mg  4-8 mg Oral Q8H PRN Alvy Bimler, Dallis Darden, MD       Or  . ondansetron (ZOFRAN-ODT) disintegrating tablet 4-8 mg  4-8 mg Oral Q8H PRN Alvy Bimler, Brayon Bielefeld, MD       Or  . ondansetron (ZOFRAN) injection 4 mg  4 mg Intravenous Q8H PRN Maureena Dabbs, MD       Or  . ondansetron (ZOFRAN) 8 mg in sodium chloride 0.9 % 50 mL IVPB  8 mg Intravenous Q8H PRN Alvy Bimler, Arabia Nylund, MD      . prochlorperazine (COMPAZINE) tablet 10 mg  10 mg Oral Q6H PRN Alvy Bimler, Beren Yniguez, MD       Or  . prochlorperazine (COMPAZINE) injection 10 mg  10 mg Intravenous Q6H PRN Alvy Bimler, Gail Creekmore, MD      . Derrill Memo ON 06/09/2016] scopolamine (TRANSDERM-SCOP) 1 MG/3DAYS 1.5 mg  1 patch Transdermal Q72H Tatelyn Vanhecke, MD      . senna (SENOKOT) tablet 8.6 mg  1 tablet Oral BID Alvy Bimler, Wilbur Labuda, MD      . senna-docusate (Senokot-S) tablet 1 tablet  1 tablet Oral QHS PRN Alvy Bimler, Kaydense Rizo, MD      . zolpidem (AMBIEN) tablet 5 mg  5 mg Oral QHS Sherelle Castelli, MD       Facility-Administered Medications Ordered in Other Encounters  Medication Dose Route Frequency Provider Last Rate Last Dose  . heparin lock flush 100 unit/mL  500 Units Intracatheter Once PRN Alvy Bimler, Gwendy Boeder, MD      . sodium chloride flush (NS) 0.9 % injection 10 mL  10 mL Intracatheter PRN Alvy Bimler, , MD        REVIEW OF SYSTEMS:   Constitutional: Denies fevers, chills or abnormal night sweats Eyes: Denies blurriness of vision, double vision or watery eyes Ears,  nose, mouth, throat, and face: Denies mucositis or sore throat Respiratory: Denies cough, dyspnea or wheezes Cardiovascular: Denies palpitation, chest  discomfort or lower extremity swelling Skin: Denies abnormal skin rashes Lymphatics: Denies new lymphadenopathy or easy bruising Behavioral/Psych: Mood is stable, no new changes  All other systems were reviewed with the patient and are negative.  PHYSICAL EXAMINATION: ECOG PERFORMANCE STATUS: 2 - Symptomatic, <50% confined to bed Temperature 99.2 respiration rate 18 heart rate 123, blood pressure 112/68 GENERAL:alert, she does not look well, in severe distress from feeling uncomfortable SKIN: skin color, texture, turgor are normal, no rashes or significant lesions EYES: normal, conjunctiva are pink and non-injected, sclera clear OROPHARYNX: Dry mucous membrane is noted NECK: supple, thyroid normal size, non-tender, without nodularity LYMPH:  no palpable lymphadenopathy in the cervical, axillary or inguinal LUNGS: clear to auscultation and percussion with normal breathing effort HEART: Tachycardia, no murmurs and no lower extremity edema ABDOMEN:abdomen soft, non-tender and normal bowel sounds.  Feeding tube site looks okay Musculoskeletal:no cyanosis of digits and no clubbing  PSYCH: alert & oriented x 3 with fluent speech NEURO: no focal motor/sensory deficits  LABORATORY DATA:  I have reviewed the data as listed Lab Results  Component Value Date   WBC 3.4 (L) 06/02/2016   HGB 9.0 (L) 06/02/2016   HCT 25.1 (L) 06/02/2016   MCV 82.0 06/02/2016   PLT 158 06/02/2016    Recent Labs  05/04/16 1031 05/29/16 1242 05/30/16 0426 05/31/16 0600 06/01/16 0523 06/03/16 0500  NA 140 142 140 136 141 140  K 3.6 2.9* 3.1* 3.5 3.6 4.3  CL  --   --  102 100* 104 105  CO2 28 28 31 30 31 31   GLUCOSE 96 85 104* 139* 117* 98  BUN 13.1 10.4 10 7 7 13   CREATININE 1.1 0.9 0.94 1.08* 0.98 0.96  CALCIUM 9.4 8.6 8.1* 8.4* 8.6* 9.1  GFRNONAA  --   --  >60 54* >60 >60  GFRAA  --   --  >60 >60 >60 >60  PROT 7.1 6.6 6.2*  --   --   --   ALBUMIN 3.5 3.2* 3.0*  --   --   --   AST 31 22 20   --   --    --   ALT 20 11 11*  --   --   --   ALKPHOS 101 73 59  --   --   --   BILITOT 0.69 1.11 1.1  --   --   --     RADIOGRAPHIC STUDIES: I have personally reviewed the radiological images as listed and agreed with the findings in the report. Dg Chest 2 View  Result Date: 05/29/2016 CLINICAL DATA:  Head and neck cancer. EXAM: CHEST  2 VIEW COMPARISON:  CT 03/02/2016 . FINDINGS: PowerPort catheter with lead tip projected over superior vena cava. Cardiomegaly with normal pulmonary vascularity. No focal infiltrate. No pleural effusion or pneumothorax. Thoracic spine scoliosis. IMPRESSION: 1. PowerPort catheter noted with lead tip projected over superior vena cava. 2. No acute cardiopulmonary disease. Electronically Signed   By: Marcello Moores  Register   On: 05/29/2016 13:11   Dg Abd 2 Views  Result Date: 05/29/2016 CLINICAL DATA:  Head neck cancer.  G-tube placement. EXAM: ABDOMEN - 2 VIEW COMPARISON:  03/13/2016 . FINDINGS: Surgical clips noted over the upper abdomen and lower pelvis. G-tube noted with its tip over the stomach. No gastric distention. No bowel distention or free air. No free air. IMPRESSION: G-tube noted  over the stomach. No evidence of gastric or bowel distention. Electronically Signed   By: Marcello Moores  Register   On: 05/29/2016 13:09    ASSESSMENT & PLAN:   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[place]) She has completed recent chemoradiation treatment. She is still experiencing significant side effects She will continue aggressive supportive care  Cancer associated pain, resolved Her pain is better controlled I will prescribe Dilaudid as needed  Continuous salivary secretion She will continue scopolamine patch for now  Severe uncontrolled nausea and vomiting with clinical signs of dehydration and excessive dizziness and tachycardia I will give her bolus IV fluidsfollowed by continuous maintenance fluid  Weight loss with moderate protein calorie malnutrition She was not able to  use of feeding tube due to uncontrolled nausea and vomiting For tonight, I will just put her on IV fluids I will consult dietitian tomorrow to restart nutritional supplement through feeding tube I will check electrolyte status tomorrow  Dysphagia She had recent modified barium swallow and speech therapist evaluation I encouraged her to try oral intake as tolerated  DVT prophylaxis On Lovenox  CODE STATUS Full code  Discharge planning She is readmitted again so soon because she cannot tolerate nutritional supplement at home. she will likely be here for the next 3-5 days.    [name], MD 06/06/2016 4:03 PM

## 2016-06-07 DIAGNOSIS — D61810 Antineoplastic chemotherapy induced pancytopenia: Secondary | ICD-10-CM

## 2016-06-07 DIAGNOSIS — R630 Anorexia: Secondary | ICD-10-CM

## 2016-06-07 DIAGNOSIS — E440 Moderate protein-calorie malnutrition: Secondary | ICD-10-CM

## 2016-06-07 LAB — COMPREHENSIVE METABOLIC PANEL
ALT: 21 U/L (ref 14–54)
AST: 41 U/L (ref 15–41)
Albumin: 3.2 g/dL — ABNORMAL LOW (ref 3.5–5.0)
Alkaline Phosphatase: 61 U/L (ref 38–126)
Anion gap: 7 (ref 5–15)
BUN: 17 mg/dL (ref 6–20)
CHLORIDE: 105 mmol/L (ref 101–111)
CO2: 28 mmol/L (ref 22–32)
CREATININE: 1.17 mg/dL — AB (ref 0.44–1.00)
Calcium: 8.8 mg/dL — ABNORMAL LOW (ref 8.9–10.3)
GFR calc Af Amer: 57 mL/min — ABNORMAL LOW (ref >60)
GFR, EST NON AFRICAN AMERICAN: 49 mL/min — AB (ref >60)
Glucose, Bld: 99 mg/dL (ref 65–99)
Potassium: 4.1 mmol/L (ref 3.5–5.1)
Sodium: 140 mmol/L (ref 135–145)
Total Bilirubin: 0.9 mg/dL (ref 0.3–1.2)
Total Protein: 6.2 g/dL — ABNORMAL LOW (ref 6.5–8.1)

## 2016-06-07 LAB — CBC WITH DIFFERENTIAL/PLATELET
Basophils Absolute: 0 10*3/uL (ref 0.0–0.1)
Basophils Relative: 1 %
Eosinophils Absolute: 0.2 10*3/uL (ref 0.0–0.7)
Eosinophils Relative: 5 %
HEMATOCRIT: 25.7 % — AB (ref 36.0–46.0)
Hemoglobin: 9.2 g/dL — ABNORMAL LOW (ref 12.0–15.0)
LYMPHS PCT: 21 %
Lymphs Abs: 0.6 10*3/uL — ABNORMAL LOW (ref 0.7–4.0)
MCH: 29.5 pg (ref 26.0–34.0)
MCHC: 35.8 g/dL (ref 30.0–36.0)
MCV: 82.4 fL (ref 78.0–100.0)
Monocytes Absolute: 0.6 10*3/uL (ref 0.1–1.0)
Monocytes Relative: 21 %
NEUTROS ABS: 1.4 10*3/uL — AB (ref 1.7–7.7)
NEUTROS PCT: 52 %
PLATELETS: 199 10*3/uL (ref 150–400)
RBC: 3.12 MIL/uL — AB (ref 3.87–5.11)
RDW: 19.8 % — ABNORMAL HIGH (ref 11.5–15.5)
WBC: 2.8 10*3/uL — AB (ref 4.0–10.5)

## 2016-06-07 LAB — PHOSPHORUS: Phosphorus: 3.5 mg/dL (ref 2.5–4.6)

## 2016-06-07 LAB — GLUCOSE, CAPILLARY: Glucose-Capillary: 82 mg/dL (ref 65–99)

## 2016-06-07 LAB — MAGNESIUM: Magnesium: 1.7 mg/dL (ref 1.7–2.4)

## 2016-06-07 MED ORDER — VITAL 1.5 CAL PO LIQD
1000.0000 mL | ORAL | Status: DC
  Administered 2016-06-07 – 2016-06-12 (×7): 1000 mL
  Filled 2016-06-07 (×9): qty 1000

## 2016-06-07 MED ORDER — JEVITY 1.2 CAL PO LIQD: 1000.0000 mL | ORAL | Status: DC

## 2016-06-07 NOTE — Progress Notes (Signed)
This encounter was created in error - please disregard.

## 2016-06-07 NOTE — Progress Notes (Signed)
Initial Nutrition Assessment  DOCUMENTATION CODES:   Severe malnutrition in context of chronic illness  INTERVENTION:   Initiate Vital 1.5 @ 15 ml/hr via PEG and increase by 10 ml every 12 hours to goal rate of 65 ml/hr.  Tube feeding regimen provides 2340 kcal (100% of needs), 105 grams of protein, and 1191 ml of H2O.   Once tolerating will add 30 ml Prostat to meet protein needs. Provide liquid Multivitamin with minerals daily  RD to continue to monitor   NUTRITION DIAGNOSIS:   Malnutrition (Severe) related to cancer and cancer related treatments, chronic illness as evidenced by percent weight loss, energy intake < or equal to 75% for > or equal to 1 month.  GOAL:   Patient will meet greater than or equal to 90% of their needs  MONITOR:   PO intake, Labs, Weight trends, TF tolerance, I & O's  REASON FOR ASSESSMENT:   Consult Enteral/tube feeding initiation and management  ASSESSMENT:   61 y.o. female is admitted for uncontrolled nausea and vomiting and severe dehydration, inability to tolerate nutritional feeding at home.  Patient was just discharged 2 days ago (6/5). Pt states that she really only tolerated Osmolite 1.5 at goal rate for 4 hours at home and had to stop it d/t persistent N/V. Pt has problems tolerating Osmolite 1.5 formula via bolus feeds initially and continues to not tolerate via continuous infusion.  Pt has been followed by [facility] prior to admissions to hospital: -PEG placed in March 2018 3/23: Pt was receiving Osmolite 1.5 via PEG, reported vomiting. 3/30: Pt was only infusing 1 can of Osmolite 1.5 at this time 4/20: Pt reported that 4 cans of Osmolite 1.5 was causing N/V about 5 minutes after infusions. 4/27: Pt switched to Ensure Plus and no longer used Osmolite 1.5 d/t intolerance. [place] stated at that time "She is unable to tolerate Osmolite 1.5 without vomiting." 5/7: Pt started to not tolerate Ensure Plus formula as well.  Reglan regimen added at this time. 5/9: Pt was transitioned to continuous feeding regimen using Osmolite 1.5, slowly advanced. 5/29: Pt was admitted to the hospital. Pt never did advance continuous feeds at home d/t a delay in getting a pump from insurance and was admitted for  N/V. Was started on Osmolite 1.5 at 15 ml/hr.  Throughout previous admission, patient continued to advance Osmolite 1.5 with some episodes of vomiting. The best day was 6/4, Osmolite 1.5 was infusing at 60 ml/hr.   RD will order Vital 1.5 @ 15 ml/hr and advance every 12 hours to goal rate of 65 ml/hr. Will add 30 ml Prostat BID once tolerating.  This will meet patient's needs. If patient still is unable to tolerate Vital formula, this will indicate intolerance may not be related to a certain formula of tube feed.  Medications: Reglan tablet QID, Senokot tablet BID, IV Zofran PRN Labs reviewed: Mg/Phos WNL  Diet Order:  Diet regular Room service appropriate? Yes; Fluid consistency: Thin  Skin:  Reviewed, no issues  Last BM:  6/6  Height:   Ht Readings from Last 1 Encounters:  06/06/16 5\' 7"  (1.702 m)    Weight:   Wt Readings from Last 1 Encounters:  06/06/16 180 lb 9.6 oz (81.9 kg)    Ideal Body Weight:  61.4 kg  BMI:  28.2 kg/m^2  Estimated Nutritional Needs:   Kcal:  2300-2500  Protein:  125-135g  Fluid:  2.5L/day  EDUCATION NEEDS:   Education needs addressed  Clayton Bibles, MS, RD, LDN Pager: [phone] After Hours Pager: [phone]

## 2016-06-07 NOTE — Progress Notes (Signed)
Cl[place]   DOB:1955-07-25   BJ#:[id number]    Subjective: She still have persistent nausea.  Dizziness has improved with aggressive IV fluid resuscitation  Objective:  Vitals:   06/06/16 1632 06/06/16 2123  BP: 123/66 (!) 127/55  Pulse: (!) 107 98  Resp: 18 19  Temp: 98.9 F (37.2 C) 98.9 F (37.2 C)     Intake/Output Summary (Last 24 hours) at 06/07/16 1607 Last data filed at 06/07/16 3710  Gross per 24 hour  Intake          1111.67 ml  Output                0 ml  Net          1111.67 ml    GENERAL:alert, no distress and comfortable SKIN: skin color, texture, turgor are normal, no rashes or significant lesions EYES: normal, Conjunctiva are pink and non-injected, sclera clear OROPHARYNX:no exudate, no erythema and lips, buccal mucosa, and tongue normal  NECK: supple, thyroid normal size, non-tender, without nodularity LYMPH:  no palpable lymphadenopathy in the cervical, axillary or inguinal LUNGS: clear to auscultation and percussion with normal breathing effort HEART: regular rate & rhythm and no murmurs and no lower extremity edema ABDOMEN:abdomen soft, non-tender and normal bowel sounds Musculoskeletal:no cyanosis of digits and no clubbing  NEURO: alert & oriented x 3 with fluent speech, no focal motor/sensory deficits   Labs:  Lab Results  Component Value Date   WBC 2.8 (L) 06/07/2016   HGB 9.2 (L) 06/07/2016   HCT 25.7 (L) 06/07/2016   MCV 82.4 06/07/2016   PLT 199 06/07/2016   NEUTROABS 1.4 (L) 06/07/2016    Lab Results  Component Value Date   NA 140 06/07/2016   K 4.1 06/07/2016   CL 105 06/07/2016   CO2 28 06/07/2016    Assessment & Plan:   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[place]) She has completed recent chemoradiation treatment. She is still experiencing significant side effects She will continue aggressive supportive care  Cancer associated pain, resolved Her pain is better controlled, off Fentanyl I will prescribe Dilaudid as  needed  Continuous salivary secretion She will continue scopolamine patch for now  Severe uncontrolled nausea and vomiting with clinical signs of dehydration and excessive dizziness and tachycardia I will give her IV fluids by continuous maintenance fluid She will continue IV antiemetics as needed  Weight loss with moderate protein calorie malnutrition She was not able to use of feeding tube due to uncontrolled nausea and vomiting I will consult dietitian to restart nutritional supplement through feeding tube I will check electrolyte status tomorrow  Dysphagia She had recent modified barium swallow and speech therapist evaluation I encouraged her to try oral intake as tolerated  Mild pancytopenia Due to recent treatment No need G-CSF support of blood transfusion We will monitor closely  DVT prophylaxis On Lovenox  CODE STATUS Full code  Discharge planning She is readmitted again so soon because she cannot tolerate nutritional supplement at home. she will likely be here for the next 3-5 days.  [name], MD 06/07/2016  9:28 AM

## 2016-06-07 NOTE — Progress Notes (Signed)
   06/07/16 0900  Clinical Encounter Type  Visited With Patient  Visit Type Initial;Spiritual support;Psychological support  Referral From Nurse  Consult/Referral To Chaplain  Spiritual Encounters  Spiritual Needs Other (Comment);Prayer ([employer] Elder Visit )  Stress Factors  Patient Stress Factors Other (Comment) (Prayer From Elder of [place])   I visited with the patient per Spiritual Care consult by the nurse stating that the patient had requested a visit from a [place] Witness elder. The patient stated to me that her husband had already contacted her religious hall in [place] to get an elder to visit. She stated that she did not need Spiritual Care to procure one for her.   I let the patient's nurse know this.   Please, contact Spiritual Care for further assistance.  [place] M.Div.

## 2016-06-08 LAB — BASIC METABOLIC PANEL
Anion gap: 7 (ref 5–15)
BUN: 13 mg/dL (ref 6–20)
CHLORIDE: 105 mmol/L (ref 101–111)
CO2: 26 mmol/L (ref 22–32)
Calcium: 8.6 mg/dL — ABNORMAL LOW (ref 8.9–10.3)
Creatinine, Ser: 1.04 mg/dL — ABNORMAL HIGH (ref 0.44–1.00)
GFR calc non Af Amer: 56 mL/min — ABNORMAL LOW (ref >60)
Glucose, Bld: 102 mg/dL — ABNORMAL HIGH (ref 65–99)
POTASSIUM: 4.2 mmol/L (ref 3.5–5.1)
SODIUM: 138 mmol/L (ref 135–145)

## 2016-06-08 LAB — GLUCOSE, CAPILLARY
GLUCOSE-CAPILLARY: 102 mg/dL — AB (ref 65–99)
GLUCOSE-CAPILLARY: 102 mg/dL — AB (ref 65–99)
GLUCOSE-CAPILLARY: 108 mg/dL — AB (ref 65–99)
GLUCOSE-CAPILLARY: 112 mg/dL — AB (ref 65–99)
Glucose-Capillary: 118 mg/dL — ABNORMAL HIGH (ref 65–99)
Glucose-Capillary: 121 mg/dL — ABNORMAL HIGH (ref 65–99)
Glucose-Capillary: 92 mg/dL (ref 65–99)

## 2016-06-08 LAB — MAGNESIUM: MAGNESIUM: 1.5 mg/dL — AB (ref 1.7–2.4)

## 2016-06-08 LAB — PHOSPHORUS: PHOSPHORUS: 3.3 mg/dL (ref 2.5–4.6)

## 2016-06-08 MED ORDER — MAGNESIUM SULFATE 2 GM/50ML IV SOLN
2.0000 g | Freq: Once | INTRAVENOUS | Status: AC
  Administered 2016-06-08: 2 g via INTRAVENOUS
  Filled 2016-06-08: qty 50

## 2016-06-08 MED ORDER — ADULT MULTIVITAMIN LIQUID CH
15.0000 mL | Freq: Every day | ORAL | Status: DC
  Administered 2016-06-08 – 2016-06-13 (×6): 15 mL
  Filled 2016-06-08 (×6): qty 15

## 2016-06-08 NOTE — Progress Notes (Signed)
[place]   DOB:[birth year]   YC#:[id number]    Subjective: She is doing well. She has minimum pain. Denies significant nausea vomiting.  She is able to tolerate nutritional supplement well at 25 cc/hour  Objective:  Vitals:   06/07/16 2208 06/08/16 0504  BP: 122/78 122/69  Pulse: 93 95  Resp: 19 20  Temp: 98.2 F (36.8 C) 98.6 F (37 C)     Intake/Output Summary (Last 24 hours) at 06/08/16 1033 Last data filed at 06/07/16 1507  Gross per 24 hour  Intake            620.5 ml  Output                0 ml  Net            620.5 ml    GENERAL:alert, no distress and comfortable SKIN: skin color, texture, turgor are normal, no rashes or significant lesions EYES: normal, Conjunctiva are pink and non-injected, sclera clear OROPHARYNX:no exudate, no erythema and lips, buccal mucosa, and tongue normal  NECK: supple, thyroid normal size, non-tender, without nodularity LYMPH:  no palpable lymphadenopathy in the cervical, axillary or inguinal LUNGS: clear to auscultation and percussion with normal breathing effort HEART: regular rate & rhythm and no murmurs and no lower extremity edema ABDOMEN:abdomen soft, non-tender and normal bowel sounds Musculoskeletal:no cyanosis of digits and no clubbing  NEURO: alert & oriented x 3 with fluent speech, no focal motor/sensory deficits   Labs:  Lab Results  Component Value Date   WBC 2.8 (L) 06/07/2016   HGB 9.2 (L) 06/07/2016   HCT 25.7 (L) 06/07/2016   MCV 82.4 06/07/2016   PLT 199 06/07/2016   NEUTROABS 1.4 (L) 06/07/2016    Lab Results  Component Value Date   NA 138 06/08/2016   K 4.2 06/08/2016   CL 105 06/08/2016   CO2 26 06/08/2016    Assessment & Plan:   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[place]) She has completed recent chemoradiation treatment. She is still experiencing significant side effects She will continue aggressive supportive care  Cancer associated pain, resolved Her pain is better controlled, off  Fentanyl I will prescribe Dilaudid as needed  Continuous salivary secretion She will continue scopolamine patch for now  Severe uncontrolled nausea and vomiting with clinical signs of dehydration and excessive dizzinessand tachycardia I will give her IV fluids by continuous maintenance fluid She will continue IV antiemetics as needed  Weight loss with moderate protein calorie malnutrition She was not able to use of feeding tube due to uncontrolled nausea and vomiting She is able to tolerate nutritional supplement, currently at 25 cc/h, with plan to slowly increase up to go at 65 cc an hour over the weekend I will check electrolyte status tomorrow and replace as needed She will get IV magnesium today  Dysphagia She had recent modified barium swallow and speech therapist evaluation I encouraged her to try oral intake as tolerated  Mild pancytopenia Due to recent treatment No need G-CSF support of blood transfusion We will monitor closely  DVT prophylaxis On Lovenox  CODE STATUS Full code  Discharge planning She is readmitted again so soon because she cannot tolerate nutritional supplement at home. she will likely be here for the next 3-5 days until goal rate is reached  [name], MD 06/08/2016  10:33 AM

## 2016-06-08 NOTE — Care Management Note (Signed)
Case Management Note  Patient Details  Name: Tiffany Yu MRN: [id number] Date of Birth: July 22, 1955  Subjective/Objective:      61 yo admitted with cancer of anterior two-thirds of tongue.              Action/Plan: Patient currently on continuous tube feeds at home of Osmolite 1.5 provided by [facility]. If patient is to change to Vital at home will need new script from MD. CM will continue to follow.  Expected Discharge Date:   (unknown)               Expected Discharge Plan:  Home/Self Care  In-House Referral:     Discharge planning Services  CM Consult  Post Acute Care Choice:    Choice offered to:     DME Arranged:    DME Agency:     HH Arranged:    HH Agency:     Status of Service:  In process, will continue to follow  If discussed at Long Length of Stay Meetings, dates discussed:    Additional CommentsLynnell Catalan, RN 06/08/2016, 10:42 AM [phone]

## 2016-06-08 NOTE — Progress Notes (Signed)
Nutrition Brief Note  Patient currently tolerating Vital 1.5 @ 25 ml/hr. Will continue to advance as ordered every 12 hours towards goal rate.  Labs noted: Low Mg   RD will continue to monitor advancement and tolerance.  Cl[place]ton Bibles, MS, RD, LDN Pager: [phone] After Hours Pager: [phone]

## 2016-06-09 LAB — CBC WITH DIFFERENTIAL/PLATELET
BASOS ABS: 0 10*3/uL (ref 0.0–0.1)
Basophils Relative: 1 %
Eosinophils Absolute: 0.1 10*3/uL (ref 0.0–0.7)
Eosinophils Relative: 4 %
HEMATOCRIT: 24.9 % — AB (ref 36.0–46.0)
HEMOGLOBIN: 9.1 g/dL — AB (ref 12.0–15.0)
LYMPHS PCT: 25 %
Lymphs Abs: 0.7 10*3/uL (ref 0.7–4.0)
MCH: 30.2 pg (ref 26.0–34.0)
MCHC: 36.5 g/dL — ABNORMAL HIGH (ref 30.0–36.0)
MCV: 82.7 fL (ref 78.0–100.0)
Monocytes Absolute: 0.4 10*3/uL (ref 0.1–1.0)
Monocytes Relative: 15 %
NEUTROS ABS: 1.6 10*3/uL — AB (ref 1.7–7.7)
Neutrophils Relative %: 55 %
Platelets: 189 10*3/uL (ref 150–400)
RBC: 3.01 MIL/uL — AB (ref 3.87–5.11)
RDW: 19.7 % — ABNORMAL HIGH (ref 11.5–15.5)
WBC: 2.9 10*3/uL — AB (ref 4.0–10.5)

## 2016-06-09 LAB — BASIC METABOLIC PANEL
ANION GAP: 5 (ref 5–15)
BUN: 8 mg/dL (ref 6–20)
CHLORIDE: 104 mmol/L (ref 101–111)
CO2: 29 mmol/L (ref 22–32)
Calcium: 8.7 mg/dL — ABNORMAL LOW (ref 8.9–10.3)
Creatinine, Ser: 1.08 mg/dL — ABNORMAL HIGH (ref 0.44–1.00)
GFR calc non Af Amer: 54 mL/min — ABNORMAL LOW (ref >60)
GLUCOSE: 119 mg/dL — AB (ref 65–99)
Potassium: 3.9 mmol/L (ref 3.5–5.1)
Sodium: 138 mmol/L (ref 135–145)

## 2016-06-09 LAB — GLUCOSE, CAPILLARY
GLUCOSE-CAPILLARY: 116 mg/dL — AB (ref 65–99)
GLUCOSE-CAPILLARY: 99 mg/dL (ref 65–99)
GLUCOSE-CAPILLARY: 106 mg/dL — AB (ref 65–99)
Glucose-Capillary: 111 mg/dL — ABNORMAL HIGH (ref 65–99)
Glucose-Capillary: 130 mg/dL — ABNORMAL HIGH (ref 65–99)
Glucose-Capillary: 114 mg/dL — ABNORMAL HIGH (ref 65–99)

## 2016-06-09 LAB — PHOSPHORUS: Phosphorus: 3.9 mg/dL (ref 2.5–4.6)

## 2016-06-09 LAB — MAGNESIUM: MAGNESIUM: 1.9 mg/dL (ref 1.7–2.4)

## 2016-06-09 MED ORDER — HYDROMORPHONE HCL 4 MG/ML IJ SOLN
2.0000 mg | INTRAMUSCULAR | Status: DC | PRN
  Administered 2016-06-09 – 2016-06-11 (×10): 2 mg via INTRAVENOUS
  Filled 2016-06-09 (×10): qty 1

## 2016-06-09 NOTE — Progress Notes (Signed)
IP PROGRESS NOTE  Subjective:   Ms. Cislo reports mild discomfort at the feeding tube site. No other complaint.  Objective: Vital signs in last 24 hours: Blood pressure 130/73, pulse 89, temperature 98.6 F (37 C), temperature source Oral, resp. rate 20, height 5\' 8"  (1.727 m), weight 183 lb 10.3 oz (83.3 kg), SpO2 98 %.  Intake/Output from previous day: 06/08 0701 - 06/09 0700 In: 120 [P.O.:60] Out: -   Physical Exam:  HEENT: No thrush Lungs: Clear bilaterally Cardiac: Regular rate and rhythm Abdomen: No hepatomegaly, soft, feeding tube site without evidence of infection Extremities: No leg edema   Portacath/PICC-without erythema  Lab Results:  Recent Labs  06/07/16 0500 06/09/16 0500  WBC 2.8* 2.9*  HGB 9.2* 9.1*  HCT 25.7* 24.9*  PLT 199 189   ANC-1.6 BMET  Recent Labs  06/08/16 0445 06/09/16 0500  NA 138 138  K 4.2 3.9  CL 105 104  CO2 26 29  GLUCOSE 102* 119*  BUN 13 8  CREATININE 1.04* 1.08*  CALCIUM 8.6* 8.7*    Medications: I have reviewed the patient's current medications.  Assessment/Plan:  1. Head and neck cancer-status post chemotherapy therapy/radiation for treatment of cancer the tongue  2. Nutrition-plan to advance tube feedings as tolerated  3. Admission with nausea/vomiting and dehydration  4. DVT prophylaxis-Lovenox  She now appears to be tolerating the tube feedings well. Tube feedings are being advanced to goal rate by the nutritionist. She has a limited oral intake, continues IV fluids. She will remain hospitalized until the tube feedings are tolerated at goal rate.   LOS: 3 days   Donneta Romberg, MD   06/09/2016, 10:48 AM

## 2016-06-10 DIAGNOSIS — K5900 Constipation, unspecified: Secondary | ICD-10-CM

## 2016-06-10 LAB — GLUCOSE, CAPILLARY
GLUCOSE-CAPILLARY: 113 mg/dL — AB (ref 65–99)
GLUCOSE-CAPILLARY: 118 mg/dL — AB (ref 65–99)
Glucose-Capillary: 110 mg/dL — ABNORMAL HIGH (ref 65–99)
Glucose-Capillary: 98 mg/dL (ref 65–99)

## 2016-06-10 MED ORDER — POLYETHYLENE GLYCOL 3350 17 G PO PACK
17.0000 g | PACK | Freq: Every day | ORAL | Status: DC
  Administered 2016-06-10 – 2016-06-13 (×3): 17 g
  Filled 2016-06-10 (×3): qty 1

## 2016-06-10 NOTE — Progress Notes (Signed)
IP PROGRESS NOTE  Subjective:   Tiffany Yu has no complaint. No nausea. No bowel movement in several days. She is tolerating the tube feedings.  Objective: Vital signs in last 24 hours: Blood pressure 126/72, pulse 96, temperature 99 F (37.2 C), temperature source Oral, resp. rate 18, height 5\' 8"  (1.727 m), weight 186 lb 8.2 oz (84.6 kg), SpO2 99 %.  Intake/Output from previous day: No intake/output data recorded.  Physical Exam:  HEENT: No thrush Lungs: Clear bilaterally Cardiac: Regular rate and rhythm Abdomen: No hepatomegaly, soft, feeding tube site without evidence of infection Extremities: No leg edema   Portacath/PICC-without erythema  Lab Results:  Recent Labs  06/09/16 0500  WBC 2.9*  HGB 9.1*  HCT 24.9*  PLT 189   ANC-1.6 BMET  Recent Labs  06/08/16 0445 06/09/16 0500  NA 138 138  K 4.2 3.9  CL 105 104  CO2 26 29  GLUCOSE 102* 119*  BUN 13 8  CREATININE 1.04* 1.08*  CALCIUM 8.6* 8.7*    Medications: I have reviewed the patient's current medications.  Assessment/Plan:  1. Head and neck cancer-status post chemotherapy therapy/radiation for treatment of cancer the tongue  2. Nutrition-Tube feedings now almost at goal rate  3. Admission with nausea/vomiting and dehydration  4. DVT prophylaxis-Lovenox  She now appears to be tolerating the tube feedings well. The tube feedings should be at goal rate today. I will add MiraLAX for constipation.  LOS: 4 days   Donneta Romberg, MD   06/10/2016, 7:57 AM

## 2016-06-11 DIAGNOSIS — R109 Unspecified abdominal pain: Secondary | ICD-10-CM

## 2016-06-11 LAB — GLUCOSE, CAPILLARY
GLUCOSE-CAPILLARY: 108 mg/dL — AB (ref 65–99)
GLUCOSE-CAPILLARY: 112 mg/dL — AB (ref 65–99)
GLUCOSE-CAPILLARY: 99 mg/dL (ref 65–99)
Glucose-Capillary: 116 mg/dL — ABNORMAL HIGH (ref 65–99)
Glucose-Capillary: 116 mg/dL — ABNORMAL HIGH (ref 65–99)
Glucose-Capillary: 89 mg/dL (ref 65–99)
Glucose-Capillary: 94 mg/dL (ref 65–99)

## 2016-06-11 MED ORDER — PRO-STAT SUGAR FREE PO LIQD
30.0000 mL | Freq: Every day | ORAL | Status: DC
  Administered 2016-06-12 – 2016-06-13 (×2): 30 mL
  Filled 2016-06-11 (×2): qty 30

## 2016-06-11 MED ORDER — HYDROMORPHONE HCL 1 MG/ML IJ SOLN
2.0000 mg | INTRAMUSCULAR | Status: DC | PRN
  Administered 2016-06-11 – 2016-06-12 (×3): 2 mg via INTRAVENOUS
  Administered 2016-06-12: 1 mg via INTRAVENOUS
  Administered 2016-06-12 (×3): 2 mg via INTRAVENOUS
  Filled 2016-06-11 (×7): qty 2

## 2016-06-11 MED ORDER — HYDROMORPHONE HCL 4 MG PO TABS
4.0000 mg | ORAL_TABLET | ORAL | Status: DC | PRN
  Administered 2016-06-11: 4 mg via ORAL
  Filled 2016-06-11: qty 1

## 2016-06-11 MED ORDER — HYDROMORPHONE HCL 2 MG PO TABS
2.0000 mg | ORAL_TABLET | ORAL | Status: DC | PRN
  Administered 2016-06-11: 2 mg via ORAL
  Filled 2016-06-11: qty 1

## 2016-06-11 NOTE — Progress Notes (Signed)
Nutrition Follow-up  DOCUMENTATION CODES:   Severe malnutrition in context of chronic illness  INTERVENTION:   Continue Vital 1.5 @ 65 ml/hr via PEG. Provide 30 ml Prostat daily. Tube feeding regimen provides 2440 kcal (100% of needs), 120 grams of protein, and 1191 ml of H2O.   Continue liquid Multivitamin with minerals daily  RD to continue to monitor   NUTRITION DIAGNOSIS:   Malnutrition (Severe) related to cancer and cancer related treatments, chronic illness as evidenced by percent weight loss, energy intake < or equal to 75% for > or equal to 1 month.  Ongoing.  GOAL:   Patient will meet greater than or equal to 90% of their needs  Meeting with TF at goal rate.  MONITOR:   PO intake, Labs, Weight trends, TF tolerance, I & O's  ASSESSMENT:   61 y.o. female is admitted for uncontrolled nausea and vomiting and severe dehydration, inability to tolerate nutritional feeding at home.   Patient reports feeling more energy since TF has been tolerated and at goal rate. Vital 1.5 is at 65 ml/hr and patient is not having any issues with toleration.  Will continue at goal and add Prostat for additional protein.  Patient has been drinking some water and ice chips. States she is going to try icies and other liquids as well.  Medications: Reglan tablet QID, liquid MVI daily, Miralax packet daily, Senokot tablet BID, IV Zofran PRN  Labs reviewed: CBGs: 108-116 Mg/Phos/K WNL  Diet Order:  Diet regular Room service appropriate? Yes; Fluid consistency: Thin  Skin:  Reviewed, no issues  Last BM:  6/7  Height:   Ht Readings from Last 1 Encounters:  06/08/16 5\' 8"  (1.727 m)    Weight:   Wt Readings from Last 1 Encounters:  06/10/16 186 lb 8.2 oz (84.6 kg)    Ideal Body Weight:  61.4 kg  BMI:  Body mass index is 28.36 kg/m.  Estimated Nutritional Needs:   Kcal:  4081-4481  Protein:  125-135g  Fluid:  2.5L/day  EDUCATION NEEDS:   Education needs  addressed  [place] Bibles, MS, RD, LDN Pager: [phone] After Hours Pager: [phone]

## 2016-06-11 NOTE — Progress Notes (Signed)
Ardel Jagger [place]   DOB:Nov 02, 1955   HX#:[id number]    Subjective: She is able to tolerate nutritional supplement at 65 cc an hour.  She reported no nausea.  She is dependent on IV Zofran at least twice a day.  She had bowel movement yesterday.  She has mild abdominal pain  Objective:  Vitals:   06/10/16 2003 06/11/16 0514  BP: 117/81 (!) 104/49  Pulse: 95 87  Resp: 18 16  Temp: 98.2 F (36.8 C) 98.4 F (36.9 C)    No intake or output data in the 24 hours ending 06/11/16 0844  GENERAL:alert, no distress and comfortable SKIN: skin color, texture, turgor are normal, no rashes or significant lesions EYES: normal, Conjunctiva are pink and non-injected, sclera clear Musculoskeletal:no cyanosis of digits and no clubbing  NEURO: alert & oriented x 3 with fluent speech, no focal motor/sensory deficits   Labs:  Lab Results  Component Value Date   WBC 2.9 (L) 06/09/2016   HGB 9.1 (L) 06/09/2016   HCT 24.9 (L) 06/09/2016   MCV 82.7 06/09/2016   PLT 189 06/09/2016   NEUTROABS 1.6 (L) 06/09/2016    Lab Results  Component Value Date   NA 138 06/09/2016   K 3.9 06/09/2016   CL 104 06/09/2016   CO2 29 06/09/2016   Assessment & Plan:   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 She is still experiencing significant side effects She will continue aggressive supportive care  Cancer associated pain, resolved Her pain is better controlled, off Fentanyl I will prescribe Dilaudid as needed Her abdominal pain could be due to persistent constipation  Continuous salivary secretion She will continue scopolamine patch for now  Severe uncontrolled nausea and vomiting with clinical signs of dehydration and excessive dizzinessand tachycardia I will give her IV fluids by continuous maintenance fluid I encouraged the patient to use oral antiemetics if possible  Weight loss with moderate protein calorie malnutrition She was not able to  use of feeding tube at home due to uncontrolled nausea and vomiting She is able to tolerate 65 cc an hour now and we will continue the same  Dysphagia She had recent modified barium swallow and speech therapist evaluation I encouraged her to try oral intake as tolerated.  So far, she has been dependent on everything intravenous I encouraged her to try to drink something by mouth  Mild pancytopenia Due to recent treatment No need G-CSF support of blood transfusion We will monitor closely  DVT prophylaxis On Lovenox  CODE STATUS Full code  Discharge planning She is readmitted again so soon because she cannot tolerate nutritional supplement at home. She is able to tolerate goal feeding however has not been able to swallow anything by mouth and is dependent on anything intravenous I will reassess again this afternoon to see if she is able to tolerate anything oral and if so, she can be discharged   [name], MD 06/11/2016  8:44 AM

## 2016-06-11 NOTE — Progress Notes (Signed)
The patient is seen again.  She denies nausea.  She has minimum pain. She felt that she is not ready to go home today. She would like a trial of at least 24 hours taking oral medications before she is ready to be discharged I plan to keep her in the hospital 1 more day and repeat some blood work tomorrow.

## 2016-06-12 ENCOUNTER — Inpatient Hospital Stay (HOSPITAL_COMMUNITY): Payer: 59

## 2016-06-12 DIAGNOSIS — F1099 Alcohol use, unspecified with unspecified alcohol-induced disorder: Secondary | ICD-10-CM

## 2016-06-12 DIAGNOSIS — R110 Nausea: Secondary | ICD-10-CM

## 2016-06-12 DIAGNOSIS — R05 Cough: Secondary | ICD-10-CM

## 2016-06-12 DIAGNOSIS — F0631 Mood disorder due to known physiological condition with depressive features: Secondary | ICD-10-CM | POA: Diagnosis present

## 2016-06-12 DIAGNOSIS — R0981 Nasal congestion: Secondary | ICD-10-CM

## 2016-06-12 DIAGNOSIS — R45851 Suicidal ideations: Secondary | ICD-10-CM

## 2016-06-12 DIAGNOSIS — F329 Major depressive disorder, single episode, unspecified: Secondary | ICD-10-CM

## 2016-06-12 LAB — CBC WITH DIFFERENTIAL/PLATELET
Basophils Absolute: 0 10*3/uL (ref 0.0–0.1)
Basophils Relative: 0 %
Eosinophils Absolute: 0.1 10*3/uL (ref 0.0–0.7)
Eosinophils Relative: 1 %
HEMATOCRIT: 25.6 % — AB (ref 36.0–46.0)
HEMOGLOBIN: 9.3 g/dL — AB (ref 12.0–15.0)
LYMPHS ABS: 0.5 10*3/uL — AB (ref 0.7–4.0)
LYMPHS PCT: 9 %
MCH: 30.4 pg (ref 26.0–34.0)
MCHC: 36.3 g/dL — AB (ref 30.0–36.0)
MCV: 83.7 fL (ref 78.0–100.0)
MONO ABS: 0.5 10*3/uL (ref 0.1–1.0)
MONOS PCT: 10 %
NEUTROS ABS: 4 10*3/uL (ref 1.7–7.7)
NEUTROS PCT: 80 %
Platelets: 177 10*3/uL (ref 150–400)
RBC: 3.06 MIL/uL — ABNORMAL LOW (ref 3.87–5.11)
RDW: 19.8 % — AB (ref 11.5–15.5)
WBC: 5.1 10*3/uL (ref 4.0–10.5)

## 2016-06-12 LAB — COMPREHENSIVE METABOLIC PANEL
ALBUMIN: 3.4 g/dL — AB (ref 3.5–5.0)
ALK PHOS: 65 U/L (ref 38–126)
ALT: 19 U/L (ref 14–54)
ANION GAP: 7 (ref 5–15)
AST: 27 U/L (ref 15–41)
BILIRUBIN TOTAL: 0.6 mg/dL (ref 0.3–1.2)
BUN: 11 mg/dL (ref 6–20)
CALCIUM: 9.2 mg/dL (ref 8.9–10.3)
CO2: 30 mmol/L (ref 22–32)
Chloride: 101 mmol/L (ref 101–111)
Creatinine, Ser: 1.04 mg/dL — ABNORMAL HIGH (ref 0.44–1.00)
GFR, EST NON AFRICAN AMERICAN: 56 mL/min — AB (ref >60)
GLUCOSE: 114 mg/dL — AB (ref 65–99)
POTASSIUM: 4.6 mmol/L (ref 3.5–5.1)
Sodium: 138 mmol/L (ref 135–145)
TOTAL PROTEIN: 6.8 g/dL (ref 6.5–8.1)

## 2016-06-12 LAB — GLUCOSE, CAPILLARY
GLUCOSE-CAPILLARY: 111 mg/dL — AB (ref 65–99)
GLUCOSE-CAPILLARY: 103 mg/dL — AB (ref 65–99)
Glucose-Capillary: 95 mg/dL (ref 65–99)
Glucose-Capillary: 124 mg/dL — ABNORMAL HIGH (ref 65–99)
Glucose-Capillary: 103 mg/dL — ABNORMAL HIGH (ref 65–99)

## 2016-06-12 LAB — MAGNESIUM: MAGNESIUM: 1.8 mg/dL (ref 1.7–2.4)

## 2016-06-12 MED ORDER — FLUTICASONE PROPIONATE 50 MCG/ACT NA SUSP
2.0000 | Freq: Every day | NASAL | Status: DC
  Administered 2016-06-12 – 2016-06-13 (×2): 2 via NASAL
  Filled 2016-06-12: qty 16

## 2016-06-12 MED ORDER — OLANZAPINE 5 MG PO TABS
5.0000 mg | ORAL_TABLET | Freq: Every day | ORAL | Status: DC
  Administered 2016-06-12: 5 mg
  Filled 2016-06-12: qty 1

## 2016-06-12 MED ORDER — MIRTAZAPINE 15 MG PO TBDP
15.0000 mg | ORAL_TABLET | Freq: Every day | ORAL | Status: DC
  Administered 2016-06-12: 15 mg via ORAL
  Filled 2016-06-12: qty 1

## 2016-06-12 MED ORDER — ACETAMINOPHEN 160 MG/5ML PO SOLN: 650.0000 mg | ORAL | Status: DC | PRN

## 2016-06-12 MED ORDER — ONDANSETRON 4 MG PO TBDP: 4.0000 mg | ORAL_TABLET | Freq: Three times a day (TID) | ORAL | Status: DC | PRN

## 2016-06-12 MED ORDER — METOCLOPRAMIDE HCL 10 MG PO TABS
10.0000 mg | ORAL_TABLET | Freq: Three times a day (TID) | ORAL | Status: DC
  Administered 2016-06-12 – 2016-06-13 (×4): 10 mg
  Filled 2016-06-12 (×4): qty 1

## 2016-06-12 MED ORDER — ONDANSETRON HCL 4 MG PO TABS: 4.0000 mg | ORAL_TABLET | Freq: Three times a day (TID) | ORAL | Status: DC | PRN

## 2016-06-12 MED ORDER — LEVOTHYROXINE SODIUM 50 MCG PO TABS
75.0000 ug | ORAL_TABLET | Freq: Every day | ORAL | Status: DC
  Administered 2016-06-13: 75 ug
  Filled 2016-06-12: qty 1

## 2016-06-12 MED ORDER — SODIUM CHLORIDE 0.9 % IV SOLN: 8.0000 mg | Freq: Three times a day (TID) | INTRAVENOUS | Status: DC | PRN

## 2016-06-12 MED ORDER — PROCHLORPERAZINE MALEATE 10 MG PO TABS: 10.0000 mg | ORAL_TABLET | Freq: Four times a day (QID) | ORAL | Status: DC | PRN

## 2016-06-12 MED ORDER — LORAZEPAM 2 MG/ML PO CONC: 1.0000 mg | Freq: Three times a day (TID) | ORAL | Status: DC | PRN

## 2016-06-12 MED ORDER — NYSTATIN 100000 UNIT/ML MT SUSP: 5.0000 mL | Freq: Four times a day (QID) | OROMUCOSAL | Status: DC

## 2016-06-12 MED ORDER — TRIAMCINOLONE ACETONIDE 55 MCG/ACT NA AERO: 2.0000 | INHALATION_SPRAY | Freq: Two times a day (BID) | NASAL | Status: DC

## 2016-06-12 MED ORDER — ENSURE ENLIVE PO LIQD
237.0000 mL | Freq: Two times a day (BID) | ORAL | Status: DC
  Administered 2016-06-13: 237 mL

## 2016-06-12 MED ORDER — SENNOSIDES-DOCUSATE SODIUM 8.6-50 MG PO TABS: 1.0000 | ORAL_TABLET | Freq: Every evening | ORAL | Status: DC | PRN

## 2016-06-12 MED ORDER — SENNOSIDES 8.8 MG/5ML PO SYRP
5.0000 mL | ORAL_SOLUTION | Freq: Two times a day (BID) | ORAL | Status: DC
  Administered 2016-06-12 – 2016-06-13 (×2): 5 mL
  Filled 2016-06-12 (×3): qty 5

## 2016-06-12 MED ORDER — PROCHLORPERAZINE EDISYLATE 5 MG/ML IJ SOLN: 10.0000 mg | Freq: Four times a day (QID) | INTRAMUSCULAR | Status: DC | PRN

## 2016-06-12 MED ORDER — OLANZAPINE 5 MG PO TABS: 5.0000 mg | ORAL_TABLET | Freq: Every day | ORAL | Status: DC

## 2016-06-12 MED ORDER — ONDANSETRON HCL 4 MG/2ML IJ SOLN: 4.0000 mg | Freq: Three times a day (TID) | INTRAMUSCULAR | Status: DC | PRN

## 2016-06-12 MED ORDER — SENNA 8.6 MG PO TABS: 1.0000 | ORAL_TABLET | Freq: Two times a day (BID) | ORAL | Status: DC

## 2016-06-12 MED ORDER — NYSTATIN 100000 UNIT/ML MT SUSP
5.0000 mL | Freq: Four times a day (QID) | OROMUCOSAL | Status: DC
  Administered 2016-06-12 – 2016-06-13 (×5): 500000 [IU] via OROMUCOSAL
  Filled 2016-06-12 (×5): qty 5

## 2016-06-12 MED ORDER — ZOLPIDEM TARTRATE 5 MG PO TABS
5.0000 mg | ORAL_TABLET | Freq: Every day | ORAL | Status: DC
  Administered 2016-06-12: 5 mg
  Filled 2016-06-12: qty 1

## 2016-06-12 MED ORDER — HYDROMORPHONE HCL 1 MG/ML PO LIQD
4.0000 mg | ORAL | Status: DC | PRN
Start: 2016-06-12 — End: 2016-06-13

## 2016-06-12 NOTE — Progress Notes (Signed)
I saw the patient again.  Chest x-ray is clear She has been seen by psychiatrist, recommendation is pending I do not recommend the patient to go home today Plan of care is discussed with the patient and her husband and they agree

## 2016-06-12 NOTE — Consult Note (Signed)
[facility] Face-to-Face Psychiatry Consult   Reason for Consult:  Depression and passive suicide ideation. Referring Physician:  Dr. Alvy Bimler Patient Identification: Tiffany Yu MRN:  [id number] Principal Diagnosis: Depression due to physical illness Diagnosis:   Patient Active Problem List   Diagnosis Date Noted  . Nausea & vomiting [R11.2] 05/29/2016  . Acquired lymphedema [I89.0] 05/11/2016  . Hypomagnesemia [E83.42] 05/04/2016  . Nausea with vomiting [R11.2] 04/19/2016  . Continuous salivary secretion [K11.7] 04/19/2016  . Paronychia of right thumb [L03.011] 04/13/2016  . Pancytopenia, acquired ([place]) [E01.007] 04/12/2016  . Acute renal failure ([place]) [N17.9] 03/22/2016  . Constipation [K59.00] 03/22/2016  . Weight loss [R63.4] 03/08/2016  . Dysarthria [R47.1] 03/08/2016  . Goals of care, counseling/discussion [Z71.89] 03/08/2016  . 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[place]) [C02.3] 02/08/2016  . Tongue cancer ([place]) [C02.9] 02/08/2016  . 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 OSA on CPAP [G47.33, Z99.89] 01/14/2014  . Snoring [R06.83] 05/06/2013  . Chronic allergic rhinitis [J30.9] 05/06/2013  . Morbid obesity ([place]) [E66.01] 05/06/2013  . Fracture of femur, distal, right, closed ([place]) [S72.401A] 02/29/2012  . Osteoarthritis of right knee [M17.11] 12/14/2011    Total Time spent with patient: 1 hour  Subjective:   Tiffany Yu is a 61 y.o. female patient admitted with Uncontrollable nausea, vomiting and dehydration.  HPI:  Tiffany Yu 61 y.o. female is seen, chart reviewed and case discussed with the patient husband and PA student during this evaluation. Patient continued to endorse symptoms of depression, unhappiness, sad, tearful, dysphoric, and low energy, disturbed sleep and appetite secondary to uncontrollable nausea, vomiting which resulted dehydration. Patient stated she has been depressed over 5  years and received outpatient medication management from primary care physician. Reportedly she was taken Wellbutrin which stopped a few months ago secondary to acute medical illness including squamous cell carcinoma of the tongue and treatment like chemotherapy and radiotherapy's. Patient feels she should give up her life instead of dealing with these problems. Patient husband has been supportive and reportedly she has been alone at home. Patient is willing to take antidepressant medication as long as she does not have to swallow the tablets or capsules. Patient is willing to swallow liquids. Will ask the pharmacist to suggest a fluoxetine are citalopram liquids if available in the form see otherwise can prescribe outside local pharmacies.  Today we will start  mirtazapine orally disintegrated tablet 15 mg at bedtime along with her current Zyprexa 5 mg at bedtime.  Past Psychiatric History: Patient has no previous acute psychiatric hospitalization but received Wellbutrin from primary care physician as reported. Patient has no history of suicidal attempts.   Risk to Self: Is patient at risk for suicide?: No Risk to Others:   Prior Inpatient Therapy:   Prior Outpatient Therapy:    Past Medical History:  Past Medical History:  Diagnosis Date  . Anxiety   . Arthritis   . Depression    takes ZYban daily  . History of colon polyps    benign  . History of radiation therapy 03/15/16- 04/30/16   Tongue and bilateral neck 68 Gy in 33 fractions  . Hyperlipidemia    takes Atorvastatin daily  . Hypertension    takes HCTZ daily  . Hypomagnesemia 05/04/2016  . Hypothyroidism    takes Synthroid daily  . Insomnia    takes Ambien nightly and Xanax as needed  . Refusal of blood transfusions as patient  is Jehovah's Witness     Past Surgical History:  Procedure Laterality Date  . CHOLECYSTECTOMY    . COLONOSCOPY    . DILATION AND CURETTAGE OF UTERUS    . EXCISION OF TONGUE LESION Left 02/08/2016    Procedure: WIDE LOCAL EXCISION OF LEFT LATERAL TONGUE;  Surgeon: Jerrell Belfast, MD;  Location: [place];  Service: ENT;  Laterality: Left;  . FEMUR IM NAIL Right 02/28/2012   Procedure: OPEN REDUCTION INTERNAL FIXATION (ORIF) RIGHT KNEE;  Surgeon: Alta Corning, MD;  Location: [place];  Service: Orthopedics;  Laterality: Right;  . IR GENERIC HISTORICAL  03/15/2016   IR FLUORO GUIDE PORT INSERTION RIGHT 03/15/2016 Sandi Mariscal, MD [facility]  . IR GENERIC HISTORICAL  03/15/2016   IR US GUIDE VASC ACCESS RIGHT 03/15/2016 Sandi Mariscal, MD [facility]  . IR GENERIC HISTORICAL  03/15/2016   IR GASTROSTOMY TUBE MOD SED 03/15/2016 [facility]  . KNEE ARTHROPLASTY  12/14/2011   Procedure: COMPUTER ASSISTED TOTAL KNEE ARTHROPLASTY;  Surgeon: Alta Corning, MD;  Location: [place];  Service: Orthopedics;  Laterality: Right;  . KNEE ARTHROSCOPY     Right  . PLANTAR FASCIA SURGERY     right foot  . RADICAL NECK DISSECTION Left 02/08/2016   Procedure: LEFT SELECTIVE RADICAL NECK DISSECTION;  Surgeon: Jerrell Belfast, MD;  Location: [place];  Service: ENT;  Laterality: Left;  . TENDON RELEASE     right wrist  . TOTAL KNEE ARTHROPLASTY  12/14/2011   right knee   Family History:  Family History  Problem Relation Age of Onset  . Anemia Mother   . Hypertension Mother   . Heart attack Father    Family Psychiatric  History: Unknown  Social History:  History  Alcohol Use  . Yes    Comment: very rarely     History  Drug Use No    Social History   Social History  . Marital status: Married    Spouse name: Ebony Hail  . Number of children: 2  . Years of education: College   Occupational History  . CUSTOMER SERVICE REP  At [employer]   Social History Main Topics  . Smoking status: Never Smoker  . Smokeless tobacco: Never Used  . Alcohol use Yes     Comment: very rarely  . Drug use: No  . Sexual activity: Not Asked   Other Topics Concern  . None   Social History Narrative   Patient is married Warden/ranger)  and lives at home with her husband.   Patient has a college education.   Patient drinks two sodas per day.   Patient has two children.   Patient is right-handed.         Additional Social History:    Allergies:  No Known Allergies  Labs:  Results for orders placed or performed during the hospital encounter of 06/06/16 (from the past 48 hour(s))  Glucose, capillary     Status: Abnormal   Collection Time: 06/10/16 12:41 PM  Result Value Ref Range   Glucose-Capillary 118 (H) 65 - 99 mg/dL  Glucose, capillary     Status: Abnormal   Collection Time: 06/10/16  4:46 PM  Result Value Ref Range   Glucose-Capillary 110 (H) 65 - 99 mg/dL  Glucose, capillary     Status: None   Collection Time: 06/10/16  8:12 PM  Result Value Ref Range   Glucose-Capillary 98 65 - 99 mg/dL   Comment 1 Notify RN  Comment 2 Document in Chart   Glucose, capillary     Status: Abnormal   Collection Time: 06/11/16 12:08 AM  Result Value Ref Range   Glucose-Capillary 116 (H) 65 - 99 mg/dL  Glucose, capillary     Status: Abnormal   Collection Time: 06/11/16  4:05 AM  Result Value Ref Range   Glucose-Capillary 116 (H) 65 - 99 mg/dL   Comment 1 Notify RN    Comment 2 Document in Chart   Glucose, capillary     Status: Abnormal   Collection Time: 06/11/16  7:58 AM  Result Value Ref Range   Glucose-Capillary 108 (H) 65 - 99 mg/dL  Glucose, capillary     Status: Abnormal   Collection Time: 06/11/16  1:27 PM  Result Value Ref Range   Glucose-Capillary 112 (H) 65 - 99 mg/dL  Glucose, capillary     Status: None   Collection Time: 06/11/16  5:19 PM  Result Value Ref Range   Glucose-Capillary 89 65 - 99 mg/dL  Glucose, capillary     Status: None   Collection Time: 06/11/16  7:48 PM  Result Value Ref Range   Glucose-Capillary 94 65 - 99 mg/dL  Glucose, capillary     Status: None   Collection Time: 06/11/16 11:50 PM  Result Value Ref Range   Glucose-Capillary 99 65 - 99 mg/dL  Glucose, capillary     Status:  None   Collection Time: 06/12/16  3:42 AM  Result Value Ref Range   Glucose-Capillary 95 65 - 99 mg/dL  CBC with Differential/Platelet     Status: Abnormal   Collection Time: 06/12/16  5:40 AM  Result Value Ref Range   WBC 5.1 4.0 - 10.5 K/uL   RBC 3.06 (L) 3.87 - 5.11 MIL/uL   Hemoglobin 9.3 (L) 12.0 - 15.0 g/dL   HCT 25.6 (L) 36.0 - 46.0 %   MCV 83.7 78.0 - 100.0 fL   MCH 30.4 26.0 - 34.0 pg   MCHC 36.3 (H) 30.0 - 36.0 g/dL   RDW 19.8 (H) 11.5 - 15.5 %   Platelets 177 150 - 400 K/uL   Neutrophils Relative % 80 %   Neutro Abs 4.0 1.7 - 7.7 K/uL   Lymphocytes Relative 9 %   Lymphs Abs 0.5 (L) 0.7 - 4.0 K/uL   Monocytes Relative 10 %   Monocytes Absolute 0.5 0.1 - 1.0 K/uL   Eosinophils Relative 1 %   Eosinophils Absolute 0.1 0.0 - 0.7 K/uL   Basophils Relative 0 %   Basophils Absolute 0.0 0.0 - 0.1 K/uL  Comprehensive metabolic panel     Status: Abnormal   Collection Time: 06/12/16  5:40 AM  Result Value Ref Range   Sodium 138 135 - 145 mmol/L   Potassium 4.6 3.5 - 5.1 mmol/L   Chloride 101 101 - 111 mmol/L   CO2 30 22 - 32 mmol/L   Glucose, Bld 114 (H) 65 - 99 mg/dL   BUN 11 6 - 20 mg/dL   Creatinine, Ser 1.04 (H) 0.44 - 1.00 mg/dL   Calcium 9.2 8.9 - 10.3 mg/dL   Total Protein 6.8 6.5 - 8.1 g/dL   Albumin 3.4 (L) 3.5 - 5.0 g/dL   AST 27 15 - 41 U/L   ALT 19 14 - 54 U/L   Alkaline Phosphatase 65 38 - 126 U/L   Total Bilirubin 0.6 0.3 - 1.2 mg/dL   GFR calc non Af Amer 56 (L) >60 mL/min   GFR calc Af  Amer >60 >60 mL/min    Comment: (NOTE) The eGFR has been calculated using the CKD EPI equation. This calculation has not been validated in all clinical situations. eGFR's persistently <60 mL/min signify possible Chronic Kidney Disease.    Anion gap 7 5 - 15  Magnesium     Status: None   Collection Time: 06/12/16  5:40 AM  Result Value Ref Range   Magnesium 1.8 1.7 - 2.4 mg/dL  Glucose, capillary     Status: Abnormal   Collection Time: 06/12/16  7:55 AM  Result  Value Ref Range   Glucose-Capillary 111 (H) 65 - 99 mg/dL   Comment 1 Notify RN    Comment 2 Document in Chart     Current Facility-Administered Medications  Medication Dose Route Frequency Provider Last Rate Last Dose  . 0.9 % NaCl with KCl 20 mEq/ L  infusion   Intravenous Continuous [name], MD 50 mL/hr at 06/11/16 2145    . acetaminophen (TYLENOL) solution 650 mg  650 mg Per Tube Q4H PRN Gorsuch, Ni, MD      . enoxaparin (LOVENOX) injection 40 mg  40 mg Subcutaneous Q24H Alvy Bimler, Ni, MD   40 mg at 06/11/16 1737  . feeding supplement (ENSURE ENLIVE) (ENSURE ENLIVE) liquid 237 mL  237 mL Per Tube BID BM Gorsuch, Ni, MD      . feeding supplement (PRO-STAT SUGAR FREE 64) liquid 30 mL  30 mL Per Tube Daily Gorsuch, Ni, MD      . feeding supplement (VITAL 1.5 CAL) liquid 1,000 mL  1,000 mL Per Tube Q24H Alvy Bimler, Ni, MD 15 mL/hr at 06/11/16 1738 1,000 mL at 06/11/16 1738  . fluticasone (FLONASE) 50 MCG/ACT nasal spray 2 spray  2 spray Each Nare Daily Thomes Lolling, RPH      . HYDROmorphone (DILAUDID) injection 2 mg  2 mg Intravenous Q2H PRN [name], MD   2 mg at 06/11/16 0749  . HYDROmorphone HCl (DILAUDID) liquid 4 mg  4 mg Per Tube Q3H PRN [name], MD      . Derrill Memo ON 06/13/2016] levothyroxine (SYNTHROID, LEVOTHROID) tablet 75 mcg  75 mcg Per Tube QAC breakfast Alvy Bimler, Ni, MD      . LORazepam (ATIVAN) 2 MG/ML concentrated solution 1 mg  1 mg Oral Q8H PRN Alvy Bimler, Ni, MD      . metoCLOPramide (REGLAN) tablet 10 mg  10 mg Per Tube TID AC & HS Gorsuch, Ni, MD      . multivitamin liquid 15 mL  15 mL Per Tube Daily Gorsuch, Ni, MD   15 mL at 06/11/16 1023  . nystatin (MYCOSTATIN) 100000 UNIT/ML suspension 500,000 Units  5 mL Mouth/Throat QID Gorsuch, Ni, MD      . OLANZapine (ZYPREXA) tablet 5 mg  5 mg Per Tube QHS Gorsuch, Ni, MD      . ondansetron (ZOFRAN) tablet 4-8 mg  4-8 mg Per Tube Q8H PRN Alvy Bimler, Ni, MD       Or  . ondansetron (ZOFRAN-ODT) disintegrating tablet 4-8 mg   4-8 mg Oral Q8H PRN Alvy Bimler, Ni, MD       Or  . ondansetron (ZOFRAN) injection 4 mg  4 mg Intravenous Q8H PRN Gorsuch, Ni, MD       Or  . ondansetron (ZOFRAN) 8 mg in sodium chloride 0.9 % 50 mL IVPB  8 mg Intravenous Q8H PRN Gorsuch, Ni, MD      . polyethylene glycol (MIRALAX / GLYCOLAX) packet 17 g  17  g Per Tube Daily Ladell Pier, MD   17 g at 06/11/16 1024  . prochlorperazine (COMPAZINE) tablet 10 mg  10 mg Per Tube Q6H PRN Alvy Bimler, Ni, MD       Or  . prochlorperazine (COMPAZINE) injection 10 mg  10 mg Intravenous Q6H PRN Gorsuch, Ni, MD      . scopolamine (TRANSDERM-SCOP) 1 MG/3DAYS 1.5 mg  1 patch Transdermal Q72H Gorsuch, Ni, MD   1.5 mg at 06/09/16 1037  . senna-docusate (Senokot-S) tablet 1 tablet  1 tablet Per Tube QHS PRN Alvy Bimler, Ni, MD      . sennosides (SENOKOT) 8.8 MG/5ML syrup 5 mL  5 mL Per Tube BID Alvy Bimler, Ni, MD      . zolpidem (AMBIEN) tablet 5 mg  5 mg Per Tube QHS Alvy Bimler, Ni, MD       Facility-Administered Medications Ordered in Other Encounters  Medication Dose Route Frequency Provider Last Rate Last Dose  . heparin lock flush 100 unit/mL  500 Units Intracatheter Once PRN Alvy Bimler, Ni, MD      . sodium chloride flush (NS) 0.9 % injection 10 mL  10 mL Intracatheter PRN Alvy Bimler, Ni, MD        Musculoskeletal: Strength & Muscle Tone: decreased Gait & Station: unable to stand Patient leans: N/A  Psychiatric Specialty Exam: Physical Exam as per history and physical   ROS generalized weakness, difficulty swallowing or painful swallowing and nausea vomiting and ongoing depression and able to take her medication.  No Fever-chills, No Headache, No changes with Vision or hearing, reports vertigo No problems swallowing food or Liquids, No Chest pain, Cough or Shortness of Breath, No Abdominal pain, No Nausea or Vommitting, Bowel movements are regular, No Blood in stool or Urine, No dysuria, No new skin rashes or bruises, No new joints pains-aches,  No new  weakness, tingling, numbness in any extremity, No recent weight gain or loss, No polyuria, polydypsia or polyphagia,  A full 10 point Review of Systems was done, except as stated above, all other Review of Systems were negative.   Blood pressure 116/80, pulse (!) 104, temperature 98.4 F (36.9 C), temperature source Oral, resp. rate 18, height '5\' 8"'  (1.727 m), weight 84.8 kg (186 lb 14.4 oz), SpO2 97 %.Body mass index is 28.42 kg/m.  General Appearance: Guarded  Eye Contact:  Fair  Speech:  Slow  Volume:  Decreased  Mood:  Anxious, Depressed and Worthless  Affect:  Depressed and Tearful  Thought Process:  Coherent and Goal Directed  Orientation:  Full (Time, Place, and Person)  Thought Content:  Rumination  Suicidal Thoughts:  Yes.  without intent/plan  Homicidal Thoughts:  No  Memory:  Immediate;   Good Recent;   Fair Remote;   Fair  Judgement:  Intact  Insight:  Fair  Psychomotor Activity:  Psychomotor Retardation  Concentration:  Concentration: Fair and Attention Span: Fair  Recall:  Good  Fund of Knowledge:  Good  Language:  Good  Akathisia:  Negative  Handed:  Right  AIMS (if indicated):     Assets:  Communication Skills Desire for Improvement Financial Resources/Insurance Housing Intimacy Leisure Time Resilience Social Support Transportation  ADL's:  Intact  Cognition:  WNL  Sleep:        Treatment Plan Summary: Daily contact with patient to assess and evaluate symptoms and progress in treatment and Medication management   We start Remeron orally disintegrating tablet 15 mg daily at bedtime for depression Continue Zyprexa 5 mg at bedtime  we will consider prescribing liquid antidepressant medication  SSRIs fluoxetine OR citalopram 20 mg when available.   Disposition: Supportive therapy provided about ongoing stressors.  Ambrose Finland, MD 06/12/2016 10:21 AM

## 2016-06-12 NOTE — Progress Notes (Signed)
[place]   DOB:1955/05/12   ZJ#:[id number]    Subjective: The patient does not feel well.  She has some nausea, nasal congestion, cough, pain and poor sleep. She felt that she cannot go home today. She is on continuous tube feeding at 65 cc an hour She denies recent constipation I have a long discussion with the patient's husband over the phone.  He has indicated to me that the patient is severely depressed at home and has indicated suicidal ideation in the past although she has not indicated that while she is in the hospital  Objective:  Vitals:   06/11/16 2030 06/12/16 0447  BP: 118/74 116/80  Pulse: 78 (!) 104  Resp: 16 18  Temp: 98 F (36.7 C) 98.4 F (36.9 C)    No intake or output data in the 24 hours ending 06/12/16 0825  GENERAL:alert, no distress and comfortable SKIN: skin color, texture, turgor are normal, no rashes or significant lesions EYES: normal, Conjunctiva are pink and non-injected, sclera clear Musculoskeletal:no cyanosis of digits and no clubbing  NEURO: alert & oriented x 3 with fluent speech, no focal motor/sensory deficits   Labs:  Lab Results  Component Value Date   WBC 5.1 06/12/2016   HGB 9.3 (L) 06/12/2016   HCT 25.6 (L) 06/12/2016   MCV 83.7 06/12/2016   PLT 177 06/12/2016   NEUTROABS 4.0 06/12/2016    Lab Results  Component Value Date   NA 138 06/12/2016   K 4.6 06/12/2016   CL 101 06/12/2016   CO2 30 06/12/2016    Assessment & Plan:   Cancer of anterior two-thirds of tongue [facility]) She has completed recent chemoradiation treatment. She is still experiencing significant side effects She will continue aggressive supportive care  Cough and nasal congestion She have signs of nasal sinusitis I will start her on Nasacort and order chest x-ray for evaluation  Cancer associated pain, resolved Her pain is better controlled, off Fentanyl I will prescribe Dilaudid as needed  Continuous salivary secretion She will continue scopolamine  patch for now  Severe uncontrolled nausea and vomiting with clinical signs of dehydration and excessive dizzinessand tachycardia I will give her IV fluids by continuous maintenance fluid I encouraged the patient to use oral antiemetics if possible  Weight loss with moderate protein calorie malnutrition She was not able to use of feeding tube at home due to uncontrolled nausea and vomiting She is able to tolerate 65 cc an hour now and we will continue the same  Dysphagia She had recent modified barium swallow and speech therapist evaluation I encouraged her to try oral intake as tolerated.  So far, she has been dependent on everything intravenous I encouraged her to try to drink something by mouth  Mild pancytopenia Due to recent treatment No need G-CSF support of blood transfusion We will monitor closely  DVT prophylaxis On Lovenox  Severe depression with history of suicidal ideation I have placed urgent consult to see psychiatrist today, have spoke to Dr. Louretta Shorten I will put her on Zyprexa that may also help with nausea and depression  CODE STATUS Full code  Discharge planning She is readmitted again so soon because she cannot tolerate nutritional supplement at home. She is able to tolerate goal feeding however has not been able to swallow anything by mouth and is dependent on anything intravenous She is unlikely ready to be discharged due to severe depression as indicated by my conversation with her husband today  [name], MD 06/12/2016  8:25 AM

## 2016-06-13 ENCOUNTER — Telehealth: Payer: Self-pay | Admitting: Hematology and Oncology

## 2016-06-13 DIAGNOSIS — F0631 Mood disorder due to known physiological condition with depressive features: Secondary | ICD-10-CM

## 2016-06-13 LAB — GLUCOSE, CAPILLARY
GLUCOSE-CAPILLARY: 102 mg/dL — AB (ref 65–99)
Glucose-Capillary: 103 mg/dL — ABNORMAL HIGH (ref 65–99)
Glucose-Capillary: 114 mg/dL — ABNORMAL HIGH (ref 65–99)

## 2016-06-13 MED ORDER — OLANZAPINE 5 MG PO TABS: 5.0000 mg | ORAL_TABLET | Freq: Every day | ORAL | 9 refills | Status: DC

## 2016-06-13 MED ORDER — HYDROMORPHONE HCL 1 MG/ML PO LIQD: 4.0000 mg | ORAL | 0 refills | Status: DC | PRN

## 2016-06-13 MED ORDER — POLYETHYLENE GLYCOL 3350 17 G PO PACK: 17.0000 g | PACK | Freq: Every day | ORAL | 0 refills | Status: AC

## 2016-06-13 MED ORDER — NYSTATIN 100000 UNIT/ML MT SUSP: 5.0000 mL | Freq: Four times a day (QID) | OROMUCOSAL | 0 refills | Status: DC

## 2016-06-13 MED ORDER — HYDROMORPHONE HCL 1 MG/ML IJ SOLN
2.0000 mg | INTRAMUSCULAR | Status: DC | PRN
  Administered 2016-06-13: 2 mg via INTRAVENOUS
  Filled 2016-06-13: qty 2

## 2016-06-13 MED ORDER — MIRTAZAPINE 15 MG PO TBDP: 15.0000 mg | ORAL_TABLET | Freq: Every day | ORAL | 9 refills | Status: AC

## 2016-06-13 MED ORDER — HEPARIN SOD (PORK) LOCK FLUSH 100 UNIT/ML IV SOLN
500.0000 [IU] | Freq: Once | INTRAVENOUS | Status: AC
  Administered 2016-06-13: 500 [IU] via INTRAVENOUS
  Filled 2016-06-13: qty 5

## 2016-06-13 MED ORDER — PRO-STAT SUGAR FREE PO LIQD: 30.0000 mL | Freq: Every day | ORAL | 0 refills | Status: AC

## 2016-06-13 MED FILL — MIRTAZAPINE 15 MG ODT: 15 | 30 days supply | Qty: 30 | Fill #0

## 2016-06-13 MED FILL — HYDROMORPHONE 5 MG/5 ML SOL: 1 | 14 days supply | Qty: 473 | Fill #0

## 2016-06-13 MED FILL — OLANZapine 5 MG TABS: 5 | 30 days supply | Qty: 30 | Fill #0

## 2016-06-13 MED FILL — NYSTATIN 100,000 UNITS/ML S: 100000 | 3 days supply | Qty: 60 | Fill #0

## 2016-06-13 NOTE — Telephone Encounter (Signed)
Spoke with patient re 6/20 f/u.

## 2016-06-13 NOTE — Discharge Summary (Signed)
Physician Discharge Summary  Patient ID: Tiffany Yu MRN: [id number] DOB/AGE: Dec 25, 1955 61 y.o.  Admit date: 06/06/2016 Discharge date: 06/13/2016  Primary Care Physician:  Bernerd Limbo, MD   Discharge Diagnoses:    Present on Admission: . 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 Nausea with vomiting . Nausea & vomiting . Depression due to physical illness   Discharge Medications:  Allergies as of 06/13/2016   No Known Allergies     Medication List    STOP taking these medications   feeding supplement (OSMOLITE 1.5 CAL) Liqd   zolpidem 10 MG tablet Commonly known as:  AMBIEN     TAKE these medications   chlorhexidine 0.12 % solution Commonly known as:  PERIDEX Rinse with 15 mls three times daily for 30 seconds. Use after breakfast, dinner, and at bedtime. Spit out excess. Do not swallow.   feeding supplement (PRO-STAT SUGAR FREE 64) Liqd Place 30 mLs into feeding tube daily.   HYDROmorphone HCl 1 MG/ML Liqd Commonly known as:  DILAUDID Place 4 mLs (4 mg total) into feeding tube every 3 (three) hours as needed for moderate pain or severe pain.   levothyroxine 75 MCG tablet Commonly known as:  SYNTHROID, LEVOTHROID Take 75 mcg by mouth daily before breakfast.   lidocaine 2 % solution Commonly known as:  XYLOCAINE Use as directed 20 mLs in the mouth or throat every 6 (six) hours as needed for mouth pain.   lidocaine-prilocaine cream Commonly known as:  EMLA Apply to affected area once   LORazepam 1 MG tablet Commonly known as:  ATIVAN Take 1 tablet (1 mg total) by mouth every 8 (eight) hours as needed for anxiety or sleep (nausea).   metoCLOPramide 10 MG tablet Commonly known as:  REGLAN Take 1 tablet (10 mg total) by mouth 4 (four) times daily -  before meals and at bedtime.   mirtazapine 15 MG disintegrating tablet Commonly known as:  REMERON SOL-TAB Take 1 tablet (15 mg total) by mouth at bedtime.   morphine 20 MG/ML concentrated  solution Commonly known as:  ROXANOL Take 1 mL (20 mg total) by mouth every 2 (two) hours as needed for severe pain.   nystatin 100000 UNIT/ML suspension Commonly known as:  MYCOSTATIN Use as directed 5 mLs (500,000 Units total) in the mouth or throat 4 (four) times daily.   OLANZapine 5 MG tablet Commonly known as:  ZYPREXA Place 1 tablet (5 mg total) into feeding tube at bedtime.   ondansetron 8 MG disintegrating tablet Commonly known as:  ZOFRAN ODT Take 1 tablet (8 mg total) by mouth every 8 (eight) hours as needed for nausea or vomiting.   polyethylene glycol packet Commonly known as:  MIRALAX / GLYCOLAX Place 17 g into feeding tube daily.   prochlorperazine 10 MG tablet Commonly known as:  COMPAZINE TAKE 1 TABLET BY MOUTH EVERY 6 HOURS AS NEEDED FOR NAUSEA AND VOMITING   scopolamine 1 MG/3DAYS Commonly known as:  TRANSDERM-SCOP Place 1 patch (1.5 mg total) onto the skin every 3 (three) days.   senna 8.6 MG tablet Commonly known as:  SENOKOT Take 1 tablet by mouth 2 (two) times daily.            Durable Medical Equipment        Start     Ordered   06/12/16 1401  For home use only DME Tube feeding  Once    Comments:  Continue Vital 1.5@ 54ml/hr via PEG. Provide 30 ml Prostat  daily. Tube feeding regimen provides 2440kcal (100% of needs), 120grams of protein, and 1112ml of H2O.     06/12/16 1400      Disposition and Follow-up:   Significant Diagnostic Studies:  Dg Chest 2 View  Result Date: 06/12/2016 CLINICAL DATA:  Onset of cough yesterday. No history of cardiopulmonary abnormality. Nonsmoker. History of tongue malignancy EXAM: CHEST  2 VIEW COMPARISON:  Chest x-ray of May 29, 2016 FINDINGS: Lungs are well-expanded and clear. The heart and pulmonary vascularity are normal. The mediastinum is normal in width. There is no pleural effusion. There is calcification in the wall of the aortic arch. The power port catheter tip projects over the midportion of  the SVC. There is stable dextrocurvature centered in the upper thoracic spine. IMPRESSION: There is no pneumonia, CHF, nor other acute cardiopulmonary abnormality. Thoracic aortic atherosclerosis. Electronically Signed   By: David  Martinique M.D.   On: 06/12/2016 08:23   Dg Chest 2 View  Result Date: 05/29/2016 CLINICAL DATA:  Head and neck cancer. EXAM: CHEST  2 VIEW COMPARISON:  CT 03/02/2016 . FINDINGS: PowerPort catheter with lead tip projected over superior vena cava. Cardiomegaly with normal pulmonary vascularity. No focal infiltrate. No pleural effusion or pneumothorax. Thoracic spine scoliosis. IMPRESSION: 1. PowerPort catheter noted with lead tip projected over superior vena cava. 2. No acute cardiopulmonary disease. Electronically Signed   By: Marcello Moores  Register   On: 05/29/2016 13:11   Dg Abd 2 Views  Result Date: 05/29/2016 CLINICAL DATA:  Head neck cancer.  G-tube placement. EXAM: ABDOMEN - 2 VIEW COMPARISON:  03/13/2016 . FINDINGS: Surgical clips noted over the upper abdomen and lower pelvis. G-tube noted with its tip over the stomach. No gastric distention. No bowel distention or free air. No free air. IMPRESSION: G-tube noted over the stomach. No evidence of gastric or bowel distention. Electronically Signed   By: Marcello Moores  Register   On: 05/29/2016 13:09    Discharge Laboratory Values: Lab Results  Component Value Date   WBC 5.1 06/12/2016   HGB 9.3 (L) 06/12/2016   HCT 25.6 (L) 06/12/2016   MCV 83.7 06/12/2016   PLT 177 06/12/2016   Lab Results  Component Value Date   NA 138 06/12/2016   K 4.6 06/12/2016   CL 101 06/12/2016   CO2 30 06/12/2016    Brief H and P: For complete details please refer to admission H and P, but in brief, the patient was admitted for severe, uncontrolled nausea and vomiting. The cause of nausea and vomiting is multifactorial.  Her tube feeding has been changed to a different formula.  She has significant medication adjustments.  She was subsequently  found to have significant depression and on urgent psychiatry consult was obtained and the patient was subsequently restarted back on some antidepressants.   Physical Exam at Discharge: BP 135/81 (BP Location: Left Arm)   Pulse (!) 110   Temp 99.7 F (37.6 C) (Oral)   Resp 20   Ht 5\' 8"  (1.727 m)   Wt 186 lb 14.4 oz (84.8 kg)   SpO2 98%   BMI 28.42 kg/m  GENERAL:alert, no distress and comfortable SKIN: skin color, texture, turgor are normal, no rashes or significant lesions EYES: normal, Conjunctiva are pink and non-injected, sclera clear OROPHARYNX:no exudate, no erythema and lips, buccal mucosa, and without mucositis NECK: supple, thyroid normal size, non-tender, without nodularity LYMPH:  no palpable lymphadenopathy in the cervical, axillary or inguinal LUNGS: clear to auscultation and percussion with normal breathing effort HEART:  regular rate & rhythm and no murmurs and no lower extremity edema ABDOMEN:abdomen soft, non-tender and normal bowel sounds Musculoskeletal:no cyanosis of digits and no clubbing  NEURO: alert & oriented x 3 with fluent speech, no focal motor/sensory deficits  Hospital Course:  Principal Problem:   Depression due to physical illness Active Problems:   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 She is still experiencing significant side effects She will continue aggressive supportive care  Cough and nasal congestion She have signs of nasal sinusitis Chest x-ray showed no evidence of infection I recommend OTC allergy medications  Cancer associated pain Her pain is better controlled, off Fentanyl I will prescribe Dilaudid as needed She is started on nystatin  Continuous salivary secretion She will continue scopolamine patch for now  Severe uncontrolled nausea and vomiting with clinical signs of  dehydration and excessive dizzinessand tachycardia I will give her IV fluids by continuous maintenance fluid I encouraged the patient to use oral antiemetics if possible  Weight loss with moderate protein calorie malnutrition She was not able to use of feeding tube at home due to uncontrolled nausea and vomiting She is able to tolerate 65 cc an hour now and we will continue the same  Dysphagia She had recent modified barium swallow and speech therapist evaluation I encouraged her to try oral intake as tolerated. So far, she has been dependent on everything intravenous I encouraged her to try to drink something by mouth  Mild pancytopenia Due to recent treatment No need G-CSF support of blood transfusion We will monitor closely  Severe depression with history of suicidal ideation She was seen by Dr. Louretta Shorten She is started on mirtazapine and olanzapine  Diet:  Regular  Activity:  As tolerated  Condition at Discharge:   stable I spent 35 minutes on discharge Signed: Dr. [name] [phone]  06/13/2016, 9:27 AM

## 2016-06-13 NOTE — Discharge Instructions (Signed)
You have an appointment with: [facility] Dr. Cheryln Manly  Where: [address], [place], [place] [address] [phone]  When: 07/18/16 9:30am  Office will call you if an earlier appointment opens

## 2016-06-13 NOTE — Progress Notes (Signed)
Spoke with pt re: recommendations of psychiatric evaluation 06/12/16. Pt reports she is interested in following up with OP individual therapy to address depressive sx related to her illness.  CSW discussed OP provider options with pt and she provided permission for CSW to make appt at [place].  Provided appointment details in pt's DC instructions.  Sharren Bridge, MSW, LCSW Clinical Social Work 06/13/2016 [phone]

## 2016-06-13 NOTE — Clinical Social Work Note (Signed)
Clinical Social Work Assessment  Patient Details  Name: Tiffany Yu MRN: [id number] Date of Birth: 06/09/1955  Date of referral:  06/12/16               Reason for consult:  Suicide Risk/Attempt, Mental Health Concerns, Emotional/Coping/Adjustment to Illness                Permission sought to share information with:  Family Supports Permission granted to share information::     Name::     [place]::     Relationship::  Spouse  Contact Information:  [phone] / [phone]   Housing/Transportation Living arrangements for the past 2 months:  Single Family Home Source of Information:  Patient, Spouse Patient Interpreter Needed:  None Criminal Activity/Legal Involvement Pertinent to Current Situation/Hospitalization:  No - Comment as needed Significant Relationships:  Spouse, Siblings, Adult Children Lives with:  Spouse Do you feel safe going back to the place where you live?  Yes Need for family participation in patient care:  No (Coment)  Care giving concerns:   Patient was diagnosed with cancer of anterior two-thirds of the tongue. Patient admitted for dehydration, dizziness, nausea and vomiting. Spouse reports patient has been very depressed. Patient reports thoughts about ending her life due to her current illness and the drastic changes she has endured.  Social Worker assessment / plan:  CSW and psychiatrist met with patient and spouse at bedside. Patient oriented x4 and agreeable to assessment. She reports, "my life was normal then this happen to me." Patient spouse and family have been very supportive of her.  Patient reports, "it hurts to drink or take my medications." Patient reports she has been depressed for over five years and received medications from her primary care physician.  She has not been taking her medications for depression because it is difficult to swallow. Patient was agreeable to call take medication in liquid form prescribe by psychiatrist.   Patient spouse reports, while he works the patient is home alone. He has arranged with family members for someone to be at home with the patient at all times.  The patient and spouse agree the patient can benefit from support groups and counseling to help talk about her feelings and concerns with the new diagnosis.   Plan:Provide information about [facility] support groups and provide follow up appointment for counseling at [facility].   Employment status:   Unknown  Nurse, adult PT Recommendations:  Not assessed at this time Information / Referral to community resources:     Patient/Family's Response to care:  Patient spouse supportive and at bedside. He is concerned about patient mental health while going through treatment.   Patient/Family's Understanding of and Emotional Response to Diagnosis, Current Treatment, and Prognosis:  "She came in for dehydration, it is difficult for her to eat of drink anything." " She need to be able to talk to someone about what she is going through." Patient family is supportive during her treatment and support any treatment that can benefit the patient overall wellbeing.  Family plans to transport her to follow appointment with therapist  and counseling groups.   Emotional Assessment Appearance:  Developmentally appropriate Attitude/Demeanor/Rapport:    Affect (typically observed):  Accepting, Depressed, Tearful/Crying, Sad Orientation:  Oriented to Self, Oriented to Place, Oriented to  Time, Oriented to Situation Alcohol / Substance use:  Not Applicable Psych involvement (Current and /or in the community):  Yes (Comment) (Depression/Suicidal Ideations)  Discharge Needs  Concerns to be addressed:  Discharge Planning Concerns Readmission within the last 30 days:  No Current discharge risk:  None Barriers to Discharge:  Continued Medical Work up   [employer], LCSW 06/13/2016, 8:35 AM

## 2016-06-19 ENCOUNTER — Other Ambulatory Visit: Payer: Self-pay | Admitting: Hematology and Oncology

## 2016-06-19 ENCOUNTER — Telehealth (HOSPITAL_COMMUNITY): Payer: Self-pay | Admitting: Dentistry

## 2016-06-19 ENCOUNTER — Telehealth: Payer: Self-pay | Admitting: *Deleted

## 2016-06-19 NOTE — Telephone Encounter (Signed)
Pt notified that she will be getting IVF tomorrow at [facility] @ 1100

## 2016-06-19 NOTE — Telephone Encounter (Signed)
Pt called requesting to have IVF on Wednesday. States she is going out of town and feels like fluids would beneficial.

## 2016-06-19 NOTE — Telephone Encounter (Signed)
pls schedule around her appt if possible

## 2016-06-19 NOTE — Telephone Encounter (Signed)
06/19/16 Called to schl. F/U appt. w/Dr. Enrique Sack.  Pt. will follow up w/dentist in [place].  LRI

## 2016-06-20 ENCOUNTER — Telehealth: Payer: Self-pay | Admitting: Hematology and Oncology

## 2016-06-20 ENCOUNTER — Ambulatory Visit (HOSPITAL_BASED_OUTPATIENT_CLINIC_OR_DEPARTMENT_OTHER): Payer: 59 | Admitting: Hematology and Oncology

## 2016-06-20 ENCOUNTER — Encounter (HOSPITAL_COMMUNITY): Payer: Self-pay

## 2016-06-20 DIAGNOSIS — R634 Abnormal weight loss: Secondary | ICD-10-CM

## 2016-06-20 DIAGNOSIS — F0631 Mood disorder due to known physiological condition with depressive features: Secondary | ICD-10-CM | POA: Diagnosis not present

## 2016-06-20 DIAGNOSIS — G893 Neoplasm related pain (acute) (chronic): Secondary | ICD-10-CM

## 2016-06-20 DIAGNOSIS — C023 Malignant neoplasm of anterior two-thirds of tongue, part unspecified: Secondary | ICD-10-CM | POA: Diagnosis not present

## 2016-06-20 MED FILL — LORazepam 1 MG TABS: 1 | 20 days supply | Qty: 60 | Fill #0

## 2016-06-20 NOTE — Telephone Encounter (Signed)
Scheduled appt per 6/20 los - Gave patient AVS and calender per LOS.  

## 2016-06-21 ENCOUNTER — Encounter: Payer: Self-pay | Admitting: Hematology and Oncology

## 2016-06-21 NOTE — Assessment & Plan Note (Signed)
Her pain is better control. I recommend slow taper of fentanyl

## 2016-06-21 NOTE — Assessment & Plan Note (Signed)
She is able to tolerate continuous tube feeds She will continue the same and I recommend increase oral intake as tolerated She has not lost any weight recently

## 2016-06-21 NOTE — Assessment & Plan Note (Signed)
This is well controlled She denies recent suicidal ideation She will continue treatment with olanzapine and Remeron

## 2016-06-21 NOTE — Progress Notes (Signed)
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 Tongue, biopsy, Anterior deep margin left SQUAMOUS CELL CARCINOMA 3. Tongue, biopsy, Posterior deep margin left NEGATIVE FOR CARCINOMA 4. Tongue, biopsy, Posterior margin left SQUAMOUS CELL CARCINOMA 5. Tongue, excisional biopsy, New anterior deep margin NEGATIVE FOR CARCINOMA 6. Tongue, excisional biopsy, New posterior deep margin SMALL FOCUS OF SQUAMOUS CELL CARCINOMA (0.1 CM, ONLY PRESENT AT THE DEEPER PERMINENT SECTION) 7. Tongue, resection for tumor, Left lateral INFILTRATIVE KERATINIZED SQUAMOUS CELL CARCINOMA (3.2 CM) THE TUMOR INVADES UNDERNEATH MUSCLE OF THE TONGUE (2.0 CM, PT2) LYMPHOVASCULAR AND PERINEURAL INVASION IDENTIFIED SQUAMOUS CELL CARCINOMA PRESENTED AT (FINAL MARGINS REFER TO PART 2-6) LATERAL INKED MARGIN (1.0 CM) MEDIAL INKED MARGIN (0.6 CM)      02/08/2016 Surgery    SURGICAL PROCEDURES: 1. Left hemiglossectomy with primary closure. 2. Left selective neck dissection (zones I-III).      03/02/2016 Imaging    CT chest with contrast: No evidence of metastatic disease in the chest. 2. Aortic atherosclerosis.       03/02/2016 Imaging    CT neck with contrast showed : Newly enlarged and round 10 mm  right level 1b lymph node (series 5, image 45). The level 1A nodes remain normal. This right 1 B node is nonspecific but suspicious for progressive nodal metastatic disease. It might be amenable to Ultrasound-guided FNA. 2. Interval left hemiglossectomy and selective left neck dissection with confluent indeterminate soft tissue from the anterior left submandibular space through the left carotid space and obscuring the left level 1B, level 2, and level IIIa nodal stations. This study will serve as a new postoperative baseline of this area. Attention directed on close interval follow-up. 3. Mild soft tissue thickening also along the left lateral tongue resection margin. Attention directed on followup. 4. Chest CT today reported separately      03/14/2016 Procedure    She has normal baseline hearing test      03/15/2016 Procedure    1. Successful placement of a right internal jugular approach power injectable Port-A-Cath. The Port a catheter is ready for immediate use. 2. Successful fluoroscopic insertion of a 20-French pull-through gastrostomy tube. The gastrostomy may be used immediately for medication administration and may be utilized in 24 hrs for the initiation of feeds.      03/15/2016 - 04/30/2016 Radiation Therapy    Tongue and Bilateral Neck treated to 68 Gy in 33 fractions      03/16/2016 - 04/27/2016 Chemotherapy    She received weekly cisplatin       04/04/2016 Adverse Reaction    Chemotherapy is placed on hold due to acute renal failure      04/13/2016 Adverse Reaction    Chemo was placed on hold due to shingles outbreak and paronychia      05/29/2016 - 06/04/2016 Hospital Admission    She was admitted  to the hospital for management of nausea, vomiting and inability to tolerate tube feeding      06/06/2016 - 06/13/2016 Hospital Admission    She was admitted to the hospital for management of nausea, vomiting and inability to tolerate tube feeding       INTERVAL HISTORY: Please see  below for problem oriented charting. She returns with family members for further follow-up She is doing well She is tolerating tube feeding without significant nausea or vomiting Her mood is stable She denies suicidal ideation She has attempted to drink liquids by mouth She denies pain   REVIEW OF SYSTEMS:   Constitutional: Denies fevers, chills or abnormal weight loss Eyes: Denies blurriness of vision Ears, nose, mouth, throat, and face: Denies mucositis or sore throat Respiratory: Denies cough, dyspnea or wheezes Cardiovascular: Denies palpitation, chest discomfort or lower extremity swelling Gastrointestinal:  Denies nausea, heartburn or change in bowel habits Skin: Denies abnormal skin rashes Lymphatics: Denies new lymphadenopathy or easy bruising Neurological:Denies numbness, tingling or new weaknesses Behavioral/Psych: Mood is stable, no new changes  All other systems were reviewed with the patient and are negative.  I have reviewed the past medical history, past surgical history, social history and family history with the patient and they are unchanged from previous note.  ALLERGIES:  has No Known Allergies.  MEDICATIONS:  Current Outpatient Prescriptions  Medication Sig Dispense Refill  . Amino Acids-Protein Hydrolys (FEEDING SUPPLEMENT, PRO-STAT SUGAR FREE 64,) LIQD Place 30 mLs into feeding tube daily. 900 mL 0  . chlorhexidine (PERIDEX) 0.12 % solution Rinse with 15 mls three times daily for 30 seconds. Use after breakfast, dinner, and at bedtime. Spit out excess. Do not swallow. 480 mL prn  . HYDROmorphone HCl (DILAUDID) 1 MG/ML LIQD Place 4 mLs (4 mg total) into feeding tube every 3 (three) hours as needed for moderate pain or severe pain. 473 mL 0  . levothyroxine (SYNTHROID, LEVOTHROID) 75 MCG tablet Take 75 mcg by mouth daily before breakfast.     . lidocaine (XYLOCAINE) 2 % solution Use as directed 20 mLs in the mouth or throat every 6 (six) hours as needed for mouth  pain.   5  . lidocaine-prilocaine (EMLA) cream Apply to affected area once 30 g 3  . LORazepam (ATIVAN) 1 MG tablet TAKE 1 TABLET BY MOUTH EVERY 8 HOURS AS NEEDED FOR ANXIETY OR SLEEP 60 tablet 0  . metoCLOPramide (REGLAN) 10 MG tablet Take 1 tablet (10 mg total) by mouth 4 (four) times daily -  before meals and at bedtime. 90 tablet 3  . mirtazapine (REMERON SOL-TAB) 15 MG disintegrating tablet Take 1 tablet (15 mg total) by mouth at bedtime. 30 tablet 9  . nystatin (MYCOSTATIN) 100000 UNIT/ML suspension Use as directed 5 mLs (500,000 Units total) in the mouth or throat 4 (four) times daily. 60 mL 0  . OLANZapine (ZYPREXA) 5 MG tablet Place 1 tablet (5 mg total) into feeding tube at bedtime. 30 tablet 9  . ondansetron (ZOFRAN ODT) 8 MG disintegrating tablet Take 1 tablet (8 mg total) by mouth every 8 (eight) hours as needed for nausea or vomiting. 30 tablet 5  . polyethylene glycol (MIRALAX / GLYCOLAX) packet Place 17 g into feeding tube daily. 14 each 0  . prochlorperazine (COMPAZINE) 10 MG tablet TAKE 1 TABLET BY MOUTH EVERY 6 HOURS AS NEEDED FOR NAUSEA AND VOMITING 30 tablet 1  . scopolamine (TRANSDERM-SCOP) 1 MG/3DAYS Place 1 patch (1.5 mg total) onto the skin every 3 (three)  days. 10 patch 12  . senna (SENOKOT) 8.6 MG tablet Take 1 tablet by mouth 2 (two) times daily.    Marland Kitchen morphine (ROXANOL) 20 MG/ML concentrated solution Take 1 mL (20 mg total) by mouth every 2 (two) hours as needed for severe pain. (Patient not taking: Reported on 05/18/2016) 120 mL 0   No current facility-administered medications for this visit.    Facility-Administered Medications Ordered in Other Visits  Medication Dose Route Frequency Provider Last Rate Last Dose  . heparin lock flush 100 unit/mL  500 Units Intracatheter Once PRN Alvy Bimler, Gwyn Hieronymus, MD      . sodium chloride flush (NS) 0.9 % injection 10 mL  10 mL Intracatheter PRN Alvy Bimler, Kateena Degroote, MD        PHYSICAL EXAMINATION: ECOG PERFORMANCE STATUS: 0 -  Asymptomatic  Vitals:   06/20/16 1002  BP: 122/77  Pulse: (!) 127  Resp: 18  Temp: 98.5 F (36.9 C)   Filed Weights   06/20/16 1002  Weight: 183 lb 3.2 oz (83.1 kg)    GENERAL:alert, no distress and comfortable SKIN: skin color, texture, turgor are normal, no rashes or significant lesions EYES: normal, Conjunctiva are pink and non-injected, sclera clear OROPHARYNX:no exudate, no erythema and lips, buccal mucosa, and tongue normal  NECK: supple, thyroid normal size, non-tender, without nodularity LYMPH:  no palpable lymphadenopathy in the cervical, axillary or inguinal LUNGS: clear to auscultation and percussion with normal breathing effort HEART: regular rate & rhythm and no murmurs and no lower extremity edema ABDOMEN:abdomen soft, non-tender and normal bowel sounds Musculoskeletal:no cyanosis of digits and no clubbing  NEURO: alert & oriented x 3 with fluent speech, no focal motor/sensory deficits  LABORATORY DATA:  I have reviewed the data as listed    Component Value Date/Time   NA 138 06/12/2016 0540   NA 142 05/29/2016 1242   K 4.6 06/12/2016 0540   K 2.9 (LL) 05/29/2016 1242   CL 101 06/12/2016 0540   CO2 30 06/12/2016 0540   CO2 28 05/29/2016 1242   GLUCOSE 114 (H) 06/12/2016 0540   GLUCOSE 85 05/29/2016 1242   BUN 11 06/12/2016 0540   BUN 10.4 05/29/2016 1242   CREATININE 1.04 (H) 06/12/2016 0540   CREATININE 0.9 05/29/2016 1242   CALCIUM 9.2 06/12/2016 0540   CALCIUM 8.6 05/29/2016 1242   PROT 6.8 06/12/2016 0540   PROT 6.6 05/29/2016 1242   ALBUMIN 3.4 (L) 06/12/2016 0540   ALBUMIN 3.2 (L) 05/29/2016 1242   AST 27 06/12/2016 0540   AST 22 05/29/2016 1242   ALT 19 06/12/2016 0540   ALT 11 05/29/2016 1242   ALKPHOS 65 06/12/2016 0540   ALKPHOS 73 05/29/2016 1242   BILITOT 0.6 06/12/2016 0540   BILITOT 1.11 05/29/2016 1242   GFRNONAA 56 (L) 06/12/2016 0540   GFRAA >60 06/12/2016 0540    No results found for: SPEP, UPEP  Lab Results  Component  Value Date   WBC 5.1 06/12/2016   NEUTROABS 4.0 06/12/2016   HGB 9.3 (L) 06/12/2016   HCT 25.6 (L) 06/12/2016   MCV 83.7 06/12/2016   PLT 177 06/12/2016      Chemistry      Component Value Date/Time   NA 138 06/12/2016 0540   NA 142 05/29/2016 1242   K 4.6 06/12/2016 0540   K 2.9 (LL) 05/29/2016 1242   CL 101 06/12/2016 0540   CO2 30 06/12/2016 0540   CO2 28 05/29/2016 1242   BUN 11 06/12/2016 0540   BUN  10.4 05/29/2016 1242   CREATININE 1.04 (H) 06/12/2016 0540   CREATININE 0.9 05/29/2016 1242      Component Value Date/Time   CALCIUM 9.2 06/12/2016 0540   CALCIUM 8.6 05/29/2016 1242   ALKPHOS 65 06/12/2016 0540   ALKPHOS 73 05/29/2016 1242   AST 27 06/12/2016 0540   AST 22 05/29/2016 1242   ALT 19 06/12/2016 0540   ALT 11 05/29/2016 1242   BILITOT 0.6 06/12/2016 0540   BILITOT 1.11 05/29/2016 1242       RADIOGRAPHIC STUDIES: I have personally reviewed the radiological images as listed and agreed with the findings in the report. Dg Chest 2 View  Result Date: 06/12/2016 CLINICAL DATA:  Onset of cough yesterday. No history of cardiopulmonary abnormality. Nonsmoker. History of tongue malignancy EXAM: CHEST  2 VIEW COMPARISON:  Chest x-ray of May 29, 2016 FINDINGS: Lungs are well-expanded and clear. The heart and pulmonary vascularity are normal. The mediastinum is normal in width. There is no pleural effusion. There is calcification in the wall of the aortic arch. The power port catheter tip projects over the midportion of the SVC. There is stable dextrocurvature centered in the upper thoracic spine. IMPRESSION: There is no pneumonia, CHF, nor other acute cardiopulmonary abnormality. Thoracic aortic atherosclerosis. Electronically Signed   By: David  Martinique M.D.   On: 06/12/2016 08:23   Dg Chest 2 View  Result Date: 05/29/2016 CLINICAL DATA:  Head and neck cancer. EXAM: CHEST  2 VIEW COMPARISON:  CT 03/02/2016 . FINDINGS: PowerPort catheter with lead tip projected over  superior vena cava. Cardiomegaly with normal pulmonary vascularity. No focal infiltrate. No pleural effusion or pneumothorax. Thoracic spine scoliosis. IMPRESSION: 1. PowerPort catheter noted with lead tip projected over superior vena cava. 2. No acute cardiopulmonary disease. Electronically Signed   By: Marcello Moores  Register   On: 05/29/2016 13:11   Dg Abd 2 Views  Result Date: 05/29/2016 CLINICAL DATA:  Head neck cancer.  G-tube placement. EXAM: ABDOMEN - 2 VIEW COMPARISON:  03/13/2016 . FINDINGS: Surgical clips noted over the upper abdomen and lower pelvis. G-tube noted with its tip over the stomach. No gastric distention. No bowel distention or free air. No free air. IMPRESSION: G-tube noted over the stomach. No evidence of gastric or bowel distention. Electronically Signed   By: Marcello Moores  Register   On: 05/29/2016 13:09   Dg Swallowing Func-speech Pathology  Result Date: 06/15/2016 Please refer to "Notes" tab for Speech Pathology notes.   ASSESSMENT & PLAN:  Cancer of anterior two-thirds of tongue [facility]) She has completed recent chemoradiation treatment. Most of the side effects of treatment are resolving She will continue aggressive supportive care I will space out her return visit appointment and see her back next month  Cancer associated pain Her pain is better control. 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  All questions were answered. The patient knows to call the clinic with any problems, questions or concerns. No barriers to learning was detected. I spent 15 minutes counseling the patient face to  face. The total time spent in the appointment was 20 minutes and more than  50% was on counseling and review of test results     [name], MD 06/21/2016 3:34 PM

## 2016-06-21 NOTE — Assessment & Plan Note (Signed)
She has completed recent chemoradiation treatment. Most of the side effects of treatment are resolving She will continue aggressive supportive care I will space out her return visit appointment and see her back next month

## 2016-06-26 ENCOUNTER — Telehealth: Payer: Self-pay

## 2016-06-26 NOTE — Telephone Encounter (Signed)
S/w pt per Dr Gorsuch attached message. 

## 2016-06-26 NOTE — Telephone Encounter (Signed)
Pt has swelling on Right side of face. Last couple of days. Under her jaw. It is tender, and she feels a couple of hard little spots. No redness. No fever, no runny nose nor ears, or eyes. No sore throat. No bug bites. Is putting ice on it, and will decrease swelling for awhile but swelling comes back up.   She is out of town. [facility] [place] if need to rx a medication. She will be back in [place] on Thursday so available on Friday, if needed.

## 2016-06-26 NOTE — Telephone Encounter (Signed)
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

## 2016-06-29 MED FILL — TRANSDERM-SCOP 1.5 MG/3 DAY: 1 | 30 days supply | Qty: 10 | Fill #2

## 2016-07-02 ENCOUNTER — Ambulatory Visit: Payer: 59

## 2016-07-11 ENCOUNTER — Telehealth: Payer: Self-pay | Admitting: Hematology and Oncology

## 2016-07-11 ENCOUNTER — Ambulatory Visit (HOSPITAL_BASED_OUTPATIENT_CLINIC_OR_DEPARTMENT_OTHER): Payer: 59 | Admitting: Hematology and Oncology

## 2016-07-11 DIAGNOSIS — R471 Dysarthria and anarthria: Secondary | ICD-10-CM

## 2016-07-11 DIAGNOSIS — G893 Neoplasm related pain (acute) (chronic): Secondary | ICD-10-CM | POA: Diagnosis not present

## 2016-07-11 DIAGNOSIS — C023 Malignant neoplasm of anterior two-thirds of tongue, part unspecified: Secondary | ICD-10-CM

## 2016-07-11 DIAGNOSIS — I890 Lymphedema, not elsewhere classified: Secondary | ICD-10-CM | POA: Diagnosis not present

## 2016-07-11 MED ORDER — HYDROMORPHONE HCL 1 MG/ML PO LIQD: 4.0000 mg | ORAL | 0 refills | Status: DC | PRN

## 2016-07-11 MED FILL — OLANZapine 5 MG TABS: 5 | 30 days supply | Qty: 30 | Fill #1

## 2016-07-11 NOTE — Telephone Encounter (Signed)
Gvpt appts for 7/16 + 8/3.

## 2016-07-12 MED FILL — HYDROMORPHONE 5 MG/5 ML SOL: 1 | 14 days supply | Qty: 473 | Fill #0

## 2016-07-13 ENCOUNTER — Encounter: Payer: Self-pay | Admitting: Hematology and Oncology

## 2016-07-13 NOTE — Progress Notes (Signed)
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 Tongue, biopsy, Anterior deep margin left SQUAMOUS CELL CARCINOMA 3. Tongue, biopsy, Posterior deep margin left NEGATIVE FOR CARCINOMA 4. Tongue, biopsy, Posterior margin left SQUAMOUS CELL CARCINOMA 5. Tongue, excisional biopsy, New anterior deep margin NEGATIVE FOR CARCINOMA 6. Tongue, excisional biopsy, New posterior deep margin SMALL FOCUS OF SQUAMOUS CELL CARCINOMA (0.1 CM, ONLY PRESENT AT THE DEEPER PERMINENT SECTION) 7. Tongue, resection for tumor, Left lateral INFILTRATIVE KERATINIZED SQUAMOUS CELL CARCINOMA (3.2 CM) THE TUMOR INVADES UNDERNEATH MUSCLE OF THE TONGUE (2.0 CM, PT2) LYMPHOVASCULAR AND PERINEURAL INVASION IDENTIFIED SQUAMOUS CELL CARCINOMA PRESENTED AT (FINAL MARGINS REFER TO PART 2-6) LATERAL INKED MARGIN (1.0 CM) MEDIAL INKED MARGIN (0.6 CM)      02/08/2016 Surgery    SURGICAL PROCEDURES: 1. Left hemiglossectomy with primary closure. 2. Left selective neck dissection (zones I-III).      03/02/2016 Imaging    CT chest with contrast: No evidence of metastatic disease in the chest. 2. Aortic atherosclerosis.       03/02/2016 Imaging    CT neck with contrast showed : Newly enlarged and round 10 mm  right level 1b lymph node (series 5, image 45). The level 1A nodes remain normal. This right 1 B node is nonspecific but suspicious for progressive nodal metastatic disease. It might be amenable to Ultrasound-guided FNA. 2. Interval left hemiglossectomy and selective left neck dissection with confluent indeterminate soft tissue from the anterior left submandibular space through the left carotid space and obscuring the left level 1B, level 2, and level IIIa nodal stations. This study will serve as a new postoperative baseline of this area. Attention directed on close interval follow-up. 3. Mild soft tissue thickening also along the left lateral tongue resection margin. Attention directed on followup. 4. Chest CT today reported separately      03/14/2016 Procedure    She has normal baseline hearing test      03/15/2016 Procedure    1. Successful placement of a right internal jugular approach power injectable Port-A-Cath. The Port a catheter is ready for immediate use. 2. Successful fluoroscopic insertion of a 20-French pull-through gastrostomy tube. The gastrostomy may be used immediately for medication administration and may be utilized in 24 hrs for the initiation of feeds.      03/15/2016 - 04/30/2016 Radiation Therapy    Tongue and Bilateral Neck treated to 68 Gy in 33 fractions      03/16/2016 - 04/27/2016 Chemotherapy    She received weekly cisplatin       04/04/2016 Adverse Reaction    Chemotherapy is placed on hold due to acute renal failure      04/13/2016 Adverse Reaction    Chemo was placed on hold due to shingles outbreak and paronychia      05/29/2016 - 06/04/2016 Hospital Admission    She was admitted  to the hospital for management of nausea, vomiting and inability to tolerate tube feeding      06/06/2016 - 06/13/2016 Hospital Admission    She was admitted to the hospital for management of nausea, vomiting and inability to tolerate tube feeding       INTERVAL HISTORY: Please see  below for problem oriented charting. She returns for further follow-up She is gaining weight However, she is reliant on feeding tube for most of her calorie needs She denies depression She is sleeping well Denies recent infection Denies recent nausea, vomiting or constipation She had minimum pain  REVIEW OF SYSTEMS:   Constitutional: Denies fevers, chills or abnormal weight loss Eyes: Denies blurriness of vision Ears, nose, mouth, throat, and face: Denies mucositis or sore throat Respiratory: Denies cough, dyspnea or wheezes Cardiovascular: Denies palpitation, chest discomfort or lower extremity swelling Gastrointestinal:  Denies nausea, heartburn or change in bowel habits Skin: Denies abnormal skin rashes Lymphatics: Denies new lymphadenopathy or easy bruising Neurological:Denies numbness, tingling or new weaknesses Behavioral/Psych: Mood is stable, no new changes  All other systems were reviewed with the patient and are negative.  I have reviewed the past medical history, past surgical history, social history and family history with the patient and they are unchanged from previous note.  ALLERGIES:  has No Known Allergies.  MEDICATIONS:  Current Outpatient Prescriptions  Medication Sig Dispense Refill  . Amino Acids-Protein Hydrolys (FEEDING SUPPLEMENT, PRO-STAT SUGAR FREE 64,) LIQD Place 30 mLs into feeding tube daily. 900 mL 0  . chlorhexidine (PERIDEX) 0.12 % solution Rinse with 15 mls three times daily for 30 seconds. Use after breakfast, dinner, and at bedtime. Spit out excess. Do not swallow. 480 mL prn  . HYDROmorphone HCl (DILAUDID) 1 MG/ML LIQD Place 4 mLs (4 mg total) into feeding tube every 3 (three) hours as needed for moderate pain or severe pain. 473 mL 0  . levothyroxine (SYNTHROID, LEVOTHROID) 75 MCG tablet Take 75 mcg by mouth daily before breakfast.     . lidocaine-prilocaine (EMLA) cream Apply to affected area once 30 g 3  . mirtazapine (REMERON SOL-TAB) 15 MG  disintegrating tablet Take 1 tablet (15 mg total) by mouth at bedtime. 30 tablet 9  . nystatin (MYCOSTATIN) 100000 UNIT/ML suspension Use as directed 5 mLs (500,000 Units total) in the mouth or throat 4 (four) times daily. 60 mL 0  . OLANZapine (ZYPREXA) 5 MG tablet Place 1 tablet (5 mg total) into feeding tube at bedtime. 30 tablet 9  . ondansetron (ZOFRAN ODT) 8 MG disintegrating tablet Take 1 tablet (8 mg total) by mouth every 8 (eight) hours as needed for nausea or vomiting. 30 tablet 5  . polyethylene glycol (MIRALAX / GLYCOLAX) packet Place 17 g into feeding tube daily. 14 each 0  . prochlorperazine (COMPAZINE) 10 MG tablet TAKE 1 TABLET BY MOUTH EVERY 6 HOURS AS NEEDED FOR NAUSEA AND VOMITING 30 tablet 1  . senna (SENOKOT) 8.6 MG tablet Take 1 tablet by mouth 2 (two) times daily.     No current facility-administered medications for this visit.    Facility-Administered Medications Ordered in Other Visits  Medication Dose Route Frequency Provider Last Rate Last Dose  . heparin lock flush 100 unit/mL  500 Units Intracatheter Once PRN Alvy Bimler, Inola Lisle, MD      . sodium chloride flush (NS) 0.9 % injection 10 mL  10 mL Intracatheter PRN Alvy Bimler, Dejha King, MD        PHYSICAL EXAMINATION: ECOG PERFORMANCE STATUS: 0 - Asymptomatic  Vitals:   07/11/16 1123  BP: 117/60  Pulse: (!) 106  Resp: 18  Temp: 99.2 F (37.3 C)   Filed Weights   07/11/16 1123  Weight: 185 lb 14.4 oz (84.3 kg)    GENERAL:alert, no distress and comfortable SKIN: skin color, texture, turgor are normal, no rashes or significant lesions EYES: normal, Conjunctiva are pink and non-injected, sclera clear OROPHARYNX:no exudate, no erythema and lips, buccal mucosa, and tongue normal  NECK: Noted lymphedema around her neck  LYMPH:  no palpable lymphadenopathy in the cervical, axillary or inguinal LUNGS: clear to auscultation and percussion with normal breathing effort HEART: regular rate & rhythm and no murmurs and no lower  extremity edema ABDOMEN:abdomen soft, non-tender and normal bowel sounds Musculoskeletal:no cyanosis of digits and no clubbing  NEURO: alert & oriented x 3 with fluent speech, no focal motor/sensory deficits  LABORATORY DATA:  I have reviewed the data as listed    Component Value Date/Time   NA 138 06/12/2016 0540   NA 142 05/29/2016 1242   K 4.6 06/12/2016 0540   K 2.9 (LL) 05/29/2016 1242   CL 101 06/12/2016 0540   CO2 30 06/12/2016 0540   CO2 28 05/29/2016 1242   GLUCOSE 114 (H) 06/12/2016 0540   GLUCOSE 85 05/29/2016 1242   BUN 11 06/12/2016 0540   BUN 10.4 05/29/2016 1242   CREATININE 1.04 (H) 06/12/2016 0540   CREATININE 0.9 05/29/2016 1242   CALCIUM 9.2 06/12/2016 0540   CALCIUM 8.6 05/29/2016 1242   PROT 6.8 06/12/2016 0540   PROT 6.6 05/29/2016 1242   ALBUMIN 3.4 (L) 06/12/2016 0540   ALBUMIN 3.2 (L) 05/29/2016 1242   AST 27 06/12/2016 0540   AST 22 05/29/2016 1242   ALT 19 06/12/2016 0540   ALT 11 05/29/2016 1242   ALKPHOS 65 06/12/2016 0540   ALKPHOS 73 05/29/2016 1242   BILITOT 0.6 06/12/2016 0540   BILITOT 1.11 05/29/2016 1242   GFRNONAA 56 (L) 06/12/2016 0540   GFRAA >60 06/12/2016 0540    No results found for: SPEP, UPEP  Lab Results  Component Value Date   WBC 5.1 06/12/2016   NEUTROABS 4.0 06/12/2016   HGB 9.3 (L) 06/12/2016   HCT 25.6 (L) 06/12/2016   MCV 83.7 06/12/2016   PLT 177 06/12/2016      Chemistry      Component Value Date/Time   NA 138 06/12/2016 0540   NA 142 05/29/2016 1242   K 4.6 06/12/2016 0540   K 2.9 (LL) 05/29/2016 1242   CL 101 06/12/2016 0540   CO2 30 06/12/2016 0540   CO2 28 05/29/2016 1242   BUN 11 06/12/2016 0540   BUN 10.4 05/29/2016 1242   CREATININE 1.04 (H) 06/12/2016 0540   CREATININE 0.9 05/29/2016 1242      Component Value Date/Time   CALCIUM 9.2 06/12/2016 0540   CALCIUM 8.6 05/29/2016 1242   ALKPHOS 65 06/12/2016 0540   ALKPHOS 73 05/29/2016 1242   AST 27 06/12/2016 0540   AST 22 05/29/2016 1242    ALT 19 06/12/2016 0540   ALT 11 05/29/2016 1242   BILITOT 0.6 06/12/2016 0540   BILITOT 1.11 05/29/2016 1242      ASSESSMENT & PLAN:  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 Most of the side effects of treatment are resolving She will continue aggressive supportive care I will space out her return visit appointment and see her back next month  Acquired lymphedema She has  acquired lymphedema around the neck from radiation treatment. Recommend physical therapy as tolerated.  Cancer associated pain Her pain is better control. I recommend slow taper of pain medication  Dysarthria She continues to have dysphagia and dysarthria She is dependent on feeding tube for nutritional needs She has appointment to see speech and language therapist I encouraged her to increase oral intake as tolerated with the plan to discontinue feeding tube in the near future.   No orders of the defined types were placed in this encounter.  All questions were answered. The patient knows to call the clinic with any problems, questions or concerns. No barriers to learning was detected. I spent 15 minutes counseling the patient face to face. The total time spent in the appointment was 20 minutes and more than 50% was on counseling and review of test results     [name], MD 07/13/2016 4:02 PM

## 2016-07-13 NOTE — Assessment & Plan Note (Signed)
She has completed recent chemoradiation treatment. Most of the side effects of treatment are resolving She will continue aggressive supportive care I will space out her return visit appointment and see her back next month

## 2016-07-13 NOTE — Assessment & Plan Note (Addendum)
Her pain is better control. I recommend slow taper of pain medication

## 2016-07-13 NOTE — Assessment & Plan Note (Signed)
She has acquired lymphedema around the neck from radiation treatment. Recommend physical therapy as tolerated.

## 2016-07-13 NOTE — Assessment & Plan Note (Signed)
She continues to have dysphagia and dysarthria She is dependent on feeding tube for nutritional needs She has appointment to see speech and language therapist I encouraged her to increase oral intake as tolerated with the plan to discontinue feeding tube in the near future.

## 2016-07-16 ENCOUNTER — Ambulatory Visit: Payer: 59 | Attending: Radiation Oncology

## 2016-07-16 ENCOUNTER — Ambulatory Visit (HOSPITAL_BASED_OUTPATIENT_CLINIC_OR_DEPARTMENT_OTHER): Payer: 59

## 2016-07-16 ENCOUNTER — Other Ambulatory Visit (HOSPITAL_BASED_OUTPATIENT_CLINIC_OR_DEPARTMENT_OTHER): Payer: 59

## 2016-07-16 VITALS — BP 118/69 | HR 109 | Temp 99.6°F | Resp 18 | Ht 68.0 in | Wt 183.7 lb

## 2016-07-16 DIAGNOSIS — C023 Malignant neoplasm of anterior two-thirds of tongue, part unspecified: Secondary | ICD-10-CM | POA: Diagnosis not present

## 2016-07-16 DIAGNOSIS — R1310 Dysphagia, unspecified: Secondary | ICD-10-CM

## 2016-07-16 LAB — CBC WITH DIFFERENTIAL/PLATELET
BASO%: 0.2 % (ref 0.0–2.0)
Basophils Absolute: 0 10*3/uL (ref 0.0–0.1)
EOS%: 2 % (ref 0.0–7.0)
Eosinophils Absolute: 0.1 10*3/uL (ref 0.0–0.5)
HEMATOCRIT: 29.4 % — AB (ref 34.8–46.6)
HEMOGLOBIN: 10.3 g/dL — AB (ref 11.6–15.9)
LYMPH#: 0.6 10*3/uL — AB (ref 0.9–3.3)
LYMPH%: 10.9 % — ABNORMAL LOW (ref 14.0–49.7)
MCH: 31.7 pg (ref 25.1–34.0)
MCHC: 35 g/dL (ref 31.5–36.0)
MCV: 90.5 fL (ref 79.5–101.0)
MONO#: 0.3 10*3/uL (ref 0.1–0.9)
MONO%: 5.3 % (ref 0.0–14.0)
NEUT#: 4.4 10*3/uL (ref 1.5–6.5)
NEUT%: 81.6 % — AB (ref 38.4–76.8)
Platelets: 220 10*3/uL (ref 145–400)
RBC: 3.25 10*6/uL — ABNORMAL LOW (ref 3.70–5.45)
RDW: 14.5 % (ref 11.2–14.5)
WBC: 5.4 10*3/uL (ref 3.9–10.3)

## 2016-07-16 LAB — COMPREHENSIVE METABOLIC PANEL
ALBUMIN: 3.5 g/dL (ref 3.5–5.0)
ALK PHOS: 89 U/L (ref 40–150)
ALT: 14 U/L (ref 0–55)
ANION GAP: 8 meq/L (ref 3–11)
AST: 26 U/L (ref 5–34)
BUN: 30.1 mg/dL — ABNORMAL HIGH (ref 7.0–26.0)
CALCIUM: 10 mg/dL (ref 8.4–10.4)
CO2: 28 mEq/L (ref 22–29)
Chloride: 102 mEq/L (ref 98–109)
Creatinine: 1.3 mg/dL — ABNORMAL HIGH (ref 0.6–1.1)
EGFR: 52 mL/min/{1.73_m2} — AB (ref >90)
Glucose: 113 mg/dl (ref 70–140)
POTASSIUM: 3.8 meq/L (ref 3.5–5.1)
Sodium: 138 mEq/L (ref 136–145)
Total Bilirubin: 0.59 mg/dL (ref 0.20–1.20)
Total Protein: 7.8 g/dL (ref 6.4–8.3)

## 2016-07-16 LAB — MAGNESIUM: MAGNESIUM: 1.9 mg/dL (ref 1.5–2.5)

## 2016-07-16 MED ORDER — SODIUM CHLORIDE 0.9% FLUSH
10.0000 mL | INTRAVENOUS | Status: DC | PRN
  Administered 2016-07-16: 10 mL
  Filled 2016-07-16: qty 10

## 2016-07-16 MED ORDER — HEPARIN SOD (PORK) LOCK FLUSH 100 UNIT/ML IV SOLN
500.0000 [IU] | Freq: Once | INTRAVENOUS | Status: AC | PRN
  Administered 2016-07-16: 500 [IU]
  Filled 2016-07-16: qty 5

## 2016-07-16 NOTE — Therapy (Signed)
[facility] [address] [place], [place], [address] Phone: [phone]   Fax:  [phone]  Speech Language Pathology Treatment  Patient Details  Name: Tiffany Yu MRN: [id number] Date of Birth: 10-23-1955 Referring Provider: Eppie Gibson MD  Encounter Date: 07/16/2016      End of Session - 07/16/16 1716    Visit Number 2   Number of Visits 7   Date for SLP Re-Evaluation 09/14/16   SLP Start Time 1322   SLP Stop Time  9628   SLP Time Calculation (min) 41 min   Activity Tolerance Patient tolerated treatment well      Past Medical History:  Diagnosis Date  . Anxiety   . Arthritis   . Depression    takes ZYban daily  . History of colon polyps    benign  . History of radiation therapy 03/15/16- 04/30/16   Tongue and bilateral neck 68 Gy in 33 fractions  . Hyperlipidemia    takes Atorvastatin daily  . Hypertension    takes HCTZ daily  . Hypomagnesemia 05/04/2016  . Hypothyroidism    takes Synthroid daily  . Insomnia    takes Ambien nightly and Xanax as needed  . Refusal of blood transfusions as patient is Jehovah's Witness     Past Surgical History:  Procedure Laterality Date  . CHOLECYSTECTOMY    . COLONOSCOPY    . DILATION AND CURETTAGE OF UTERUS    . EXCISION OF TONGUE LESION Left 02/08/2016   Procedure: WIDE LOCAL EXCISION OF LEFT LATERAL TONGUE;  Surgeon: Jerrell Belfast, MD;  Location: [facility];  Service: ENT;  Laterality: Left;  . FEMUR IM NAIL Right 02/28/2012   Procedure: OPEN REDUCTION INTERNAL FIXATION (ORIF) RIGHT KNEE;  Surgeon: Alta Corning, MD;  Location: [facility];  Service: Orthopedics;  Laterality: Right;  . IR GENERIC HISTORICAL  03/15/2016   IR FLUORO GUIDE PORT INSERTION RIGHT 03/15/2016 Sandi Mariscal, MD [facility]  . IR GENERIC HISTORICAL  03/15/2016   IR US GUIDE VASC ACCESS RIGHT 03/15/2016 Sandi Mariscal, MD [facility]  . IR GENERIC HISTORICAL  03/15/2016   IR GASTROSTOMY TUBE MOD SED  03/15/2016 [facility]  . KNEE ARTHROPLASTY  12/14/2011   Procedure: COMPUTER ASSISTED TOTAL KNEE ARTHROPLASTY;  Surgeon: Alta Corning, MD;  Location: [facility];  Service: Orthopedics;  Laterality: Right;  . KNEE ARTHROSCOPY     Right  . PLANTAR FASCIA SURGERY     right foot  . RADICAL NECK DISSECTION Left 02/08/2016   Procedure: LEFT SELECTIVE RADICAL NECK DISSECTION;  Surgeon: Jerrell Belfast, MD;  Location: [facility];  Service: ENT;  Laterality: Left;  . TENDON RELEASE     right wrist  . TOTAL KNEE ARTHROPLASTY  12/14/2011   right knee    There were no vitals filed for this visit.      Subjective Assessment - 07/16/16 1704    Subjective Pt had modified barium swallow exam (MBSS) first week of June 2018, She reports SLP encouraged her to eat, restriction on straw use               ADULT SLP TREATMENT - 07/16/16 1705      General Information   Behavior/Cognition Cooperative;Pleasant mood;Alert     Treatment Provided   Treatment provided Dysphagia     Dysphagia Treatment   Temperature Spikes Noted No   Respiratory Status Room air   Treatment Methods Skilled observation;Therapeutic exercise;Compensation strategy training;Patient/caregiver education   Patient observed  directly with PO's Yes   Type of PO's observed Dysphagia 1 (puree);Thin liquids   Feeding Able to feed self   Liquids provided via Teaspoon;Cup   Oral Phase Signs & Symptoms --  placement on rt side oral cavity   Pharyngeal Phase Signs & Symptoms Multiple swallows;Immediate throat clear;Complaints of residue   Type of cueing Verbal;Visual   Amount of cueing Moderate  occasionally, faded to SBA   Other treatment/comments Pt reported with bolus placement on rt side of oral cavity and head turn to lt that eating 1/2 teaspoon boluses with alternating solid/liquid was functional (rare throat clear, 2-3 swallows/bolus), whereas with middle bolus placement and straight head position resulted in SLP-observed and pt  agreed mulitple swallows (x4-6 depending on size of bolus) and immediate throat clear after 75-80% of the swallows. Performance with HEP req'd min-mod A rarely from SLP. Pt has been noncompliant with frequency of HEP to date and SLP reminded pt of rationale of HEP and encouraged x2 completion and that reps should improve as pt continues consistently with completion. Pt left session and said, "I'm glad I came today, I'm not leaving depressed. You gave me some hope."      Assessment / Recommendations / Plan   Plan Continue with current plan of care     Dysphagia Recommendations   Diet recommendations Dysphagia 1 (puree);Thin liquid;Nectar-thick liquid   Liquids provided via Cup;Teaspoon   Medication Administration Via alternative means  or with puree PRN   Compensations Slow rate;Small sips/bites;Multiple dry swallows after each bite/sip;Follow solids with liquid   Postural Changes and/or Swallow Maneuvers Head turn left during swallow     Progression Toward Goals   Progression toward goals Progressing toward goals          SLP Education - 07/16/16 1715    Education provided Yes   Education Details HEP, rationale for HEP, swallow precautions   Person(s) Educated Patient   Methods Explanation;Demonstration;Verbal cues   Comprehension Verbalized understanding;Returned demonstration;Verbal cues required;Need further instruction          SLP Short Term Goals - 07/16/16 1718      SLP SHORT TERM GOAL #1   Title pt will demo HEP with rare min A over two sessions   Time 2   Period --  visits   Status New     SLP [place] #2   Title pt will tell SLP why she is completing HEP   Time 1   Period --  vists   Status New     SLP SHORT TERM GOAL #3   Title pt will tell SLP how long she has to complete HEP x2-3/day (until 6 months after last rad day)   Time 1   Period --  visits   Status New          SLP Long Term Goals - 07/16/16 1718      SLP LONG TERM GOAL #1    Title pt will complete HEP with rare min A over two sessions   Time 5   Period --  visits   Status New     SLP LONG TERM GOAL #2   Title pt will tell how a food journal can assist pt in returning to more regular texture foods   Time 5   Period --  visits   Status New     SLP LONG TERM GOAL #3   Title pt will not exhibit overt signs/symptoms of oropharyngeal dysphagia over two sessions  following completion of rad tx   Time 5   Period --  visits   Status New          Plan - 07/16/16 1716    Clinical Impression Statement Pt presents today with dysphagia - mild dysartrhria has mostly resolved. With precautions and modifications for posture, pt's POs are functional. See "skilled intervention" for furhter details. Skilled ST remains necessary to ensure pt remains safe with POs and to assess completion of HEP properly.   Speech Therapy Frequency --  once approx every four weeks   Duration --  6 visits   Treatment/Interventions Aspiration precaution training;Pharyngeal strengthening exercises;Diet toleration management by SLP;Compensatory techniques;Internal/external aids;SLP instruction and feedback;Patient/family education;Compensatory strategies;Trials of upgraded texture/liquids;Cueing hierarchy  any or all may be used   Potential to Achieve Goals Good      Patient will benefit from skilled therapeutic intervention in order to improve the following deficits and impairments:   Dysphagia, unspecified type    Problem List Patient Active Problem List   Diagnosis Date Noted  . Depression due to physical illness 06/12/2016  . Nausea & vomiting 05/29/2016  . Acquired lymphedema 05/11/2016  . Hypomagnesemia 05/04/2016  . Nausea with vomiting 04/19/2016  . Continuous salivary secretion 04/19/2016  . Paronychia of right thumb 04/13/2016  . Pancytopenia, acquired ([place]) 04/12/2016  . Acute renal failure ([place]) 03/22/2016  . Constipation 03/22/2016  . Weight loss 03/08/2016  .  Dysarthria 03/08/2016  . Goals of care, counseling/discussion 03/08/2016  . Cancer associated pain 03/07/2016  . 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 Tongue cancer ([place]) 02/08/2016  . 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 OSA on CPAP 01/14/2014  . Snoring 05/06/2013  . Chronic allergic rhinitis 05/06/2013  . Morbid obesity ([place]) 05/06/2013  . Fracture of femur, distal, right, closed ([place]) 02/29/2012  . Osteoarthritis of right knee 12/14/2011    [facility] ,MS, CCC-SLP  07/16/2016, 5:19 PM  [facility] [address] [place], [place], [address] Phone: [phone]   Fax:  [phone]   Name: Tiffany Yu MRN: [id number] Date of Birth: Jan 13, 1955

## 2016-07-16 NOTE — Patient Instructions (Signed)
Implanted Port Home Guide An implanted port is a type of central line that is placed under the skin. Central lines are used to provide IV access when treatment or nutrition needs to be given through a person's veins. Implanted ports are used for long-term IV access. An implanted port may be placed because:  You need IV medicine that would be irritating to the small veins in your hands or arms.  You need long-term IV medicines, such as antibiotics.  You need IV nutrition for a long period.  You need frequent blood draws for lab tests.  You need dialysis.  Implanted ports are usually placed in the chest area, but they can also be placed in the upper arm, the abdomen, or the leg. An implanted port has two main parts:  Reservoir. The reservoir is round and will appear as a small, raised area under your skin. The reservoir is the part where a needle is inserted to give medicines or draw blood.  Catheter. The catheter is a thin, flexible tube that extends from the reservoir. The catheter is placed into a large vein. Medicine that is inserted into the reservoir goes into the catheter and then into the vein.  How will I care for my incision site? Do not get the incision site wet. Bathe or shower as directed by your health care provider. How is my port accessed? Special steps must be taken to access the port:  Before the port is accessed, a numbing cream can be placed on the skin. This helps numb the skin over the port site.  Your health care provider uses a sterile technique to access the port. ? Your health care provider must put on a mask and sterile gloves. ? The skin over your port is cleaned carefully with an antiseptic and allowed to dry. ? The port is gently pinched between sterile gloves, and a needle is inserted into the port.  Only "non-coring" port needles should be used to access the port. Once the port is accessed, a blood return should be checked. This helps ensure that the port  is in the vein and is not clogged.  If your port needs to remain accessed for a constant infusion, a clear (transparent) bandage will be placed over the needle site. The bandage and needle will need to be changed every week, or as directed by your health care provider.  Keep the bandage covering the needle clean and dry. Do not get it wet. Follow your health care provider's instructions on how to take a shower or bath while the port is accessed.  If your port does not need to stay accessed, no bandage is needed over the port.  What is flushing? Flushing helps keep the port from getting clogged. Follow your health care provider's instructions on how and when to flush the port. Ports are usually flushed with saline solution or a medicine called heparin. The need for flushing will depend on how the port is used.  If the port is used for intermittent medicines or blood draws, the port will need to be flushed: ? After medicines have been given. ? After blood has been drawn. ? As part of routine maintenance.  If a constant infusion is running, the port may not need to be flushed.  How long will my port stay implanted? The port can stay in for as long as your health care provider thinks it is needed. When it is time for the port to come out, surgery will be   done to remove it. The procedure is similar to the one performed when the port was put in. When should I seek immediate medical care? When you have an implanted port, you should seek immediate medical care if:  You notice a bad smell coming from the incision site.  You have swelling, redness, or drainage at the incision site.  You have more swelling or pain at the port site or the surrounding area.  You have a fever that is not controlled with medicine.  This information is not intended to replace advice given to you by your health care provider. Make sure you discuss any questions you have with your health care provider. Document  Released: 12/18/2004 Document Revised: 05/26/2015 Document Reviewed: 08/25/2012 Elsevier Interactive Patient Education  2017 [employer]  

## 2016-07-16 NOTE — Patient Instructions (Signed)
PLACE ON RT SIDE OF MOUTH AND TURN HEAD TO LEFT: 1/2 teaspoon solid, then small amount of ice chips/teaspoon liquid  DO YOUR EXERCISES! You should improve with strength as you remain consistent in doing them

## 2016-07-18 ENCOUNTER — Ambulatory Visit: Payer: PRIVATE HEALTH INSURANCE | Admitting: Psychology

## 2016-07-30 ENCOUNTER — Other Ambulatory Visit: Payer: Self-pay | Admitting: Hematology and Oncology

## 2016-07-30 ENCOUNTER — Other Ambulatory Visit: Payer: Self-pay

## 2016-07-30 MED ORDER — LORAZEPAM 1 MG PO TABS: ORAL_TABLET | ORAL | 0 refills | Status: DC

## 2016-07-30 MED FILL — LORazepam 1 MG TABS: 1 | 20 days supply | Qty: 60 | Fill #0

## 2016-07-31 NOTE — Progress Notes (Signed)
error 

## 2016-08-01 ENCOUNTER — Telehealth: Payer: Self-pay | Admitting: *Deleted

## 2016-08-01 NOTE — Telephone Encounter (Signed)
Called patient to inform of CT for 08-09-16- arrival time- 1:15 p.m. @ [facility], pt. to have clear liquids only 4 hrs. prior to test, pt. to come for fu on 08-10-16 @ 2 pm for results with Dr. Isidore Moos, lvm for a return call

## 2016-08-03 ENCOUNTER — Ambulatory Visit
Admission: RE | Admit: 2016-08-03 | Discharge: 2016-08-03 | Disposition: A | Discharge status: Home / Self Care | Payer: 59 | Source: Ambulatory Visit | Attending: Radiation Oncology | Admitting: Radiation Oncology

## 2016-08-03 ENCOUNTER — Telehealth: Payer: Self-pay | Admitting: Hematology and Oncology

## 2016-08-03 ENCOUNTER — Encounter: Payer: Self-pay | Admitting: Hematology and Oncology

## 2016-08-03 ENCOUNTER — Ambulatory Visit (HOSPITAL_BASED_OUTPATIENT_CLINIC_OR_DEPARTMENT_OTHER): Payer: 59 | Admitting: Hematology and Oncology

## 2016-08-03 DIAGNOSIS — R471 Dysarthria and anarthria: Secondary | ICD-10-CM

## 2016-08-03 DIAGNOSIS — F0631 Mood disorder due to known physiological condition with depressive features: Secondary | ICD-10-CM | POA: Diagnosis not present

## 2016-08-03 DIAGNOSIS — C023 Malignant neoplasm of anterior two-thirds of tongue, part unspecified: Secondary | ICD-10-CM | POA: Insufficient documentation

## 2016-08-03 DIAGNOSIS — G893 Neoplasm related pain (acute) (chronic): Secondary | ICD-10-CM

## 2016-08-03 DIAGNOSIS — R6889 Other general symptoms and signs: Secondary | ICD-10-CM | POA: Insufficient documentation

## 2016-08-03 DIAGNOSIS — R918 Other nonspecific abnormal finding of lung field: Secondary | ICD-10-CM | POA: Insufficient documentation

## 2016-08-03 DIAGNOSIS — R1310 Dysphagia, unspecified: Secondary | ICD-10-CM | POA: Diagnosis not present

## 2016-08-03 DIAGNOSIS — Z08 Encounter for follow-up examination after completed treatment for malignant neoplasm: Secondary | ICD-10-CM | POA: Insufficient documentation

## 2016-08-03 DIAGNOSIS — Z9221 Personal history of antineoplastic chemotherapy: Secondary | ICD-10-CM | POA: Insufficient documentation

## 2016-08-03 DIAGNOSIS — I890 Lymphedema, not elsewhere classified: Secondary | ICD-10-CM

## 2016-08-03 DIAGNOSIS — Z923 Personal history of irradiation: Secondary | ICD-10-CM | POA: Insufficient documentation

## 2016-08-03 DIAGNOSIS — Z79899 Other long term (current) drug therapy: Secondary | ICD-10-CM | POA: Insufficient documentation

## 2016-08-03 NOTE — Assessment & Plan Note (Signed)
She has profound dysarthria I recommend close follow-up with speech therapist

## 2016-08-03 NOTE — Assessment & Plan Note (Signed)
Her pain is improving She is only used Dilaudid on a rare basis We will continue to same for now with plan to taper her off pain medicine

## 2016-08-03 NOTE — Assessment & Plan Note (Signed)
She has persistent dysphagia and has not been doing her neck exercises as directed Overall, her dysphagia is improving and she is able to tolerate some soft diet I recommend she transition most of her diet by mouth with the aim to remove the feeding tube at the end of the month if possible She will continue close follow-up with speech and language therapist

## 2016-08-03 NOTE — Progress Notes (Signed)
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 Tongue, biopsy, Anterior deep margin left SQUAMOUS CELL CARCINOMA 3. Tongue, biopsy, Posterior deep margin left NEGATIVE FOR CARCINOMA 4. Tongue, biopsy, Posterior margin left SQUAMOUS CELL CARCINOMA 5. Tongue, excisional biopsy, New anterior deep margin NEGATIVE FOR CARCINOMA 6. Tongue, excisional biopsy, New posterior deep margin SMALL FOCUS OF SQUAMOUS CELL CARCINOMA (0.1 CM, ONLY PRESENT AT THE DEEPER PERMINENT SECTION) 7. Tongue, resection for tumor, Left lateral INFILTRATIVE KERATINIZED SQUAMOUS CELL CARCINOMA (3.2 CM) THE TUMOR INVADES UNDERNEATH MUSCLE OF THE TONGUE (2.0 CM, PT2) LYMPHOVASCULAR AND PERINEURAL INVASION IDENTIFIED SQUAMOUS CELL CARCINOMA PRESENTED AT (FINAL MARGINS REFER TO PART 2-6) LATERAL INKED MARGIN (1.0 CM) MEDIAL INKED MARGIN (0.6 CM)      02/08/2016 Surgery    SURGICAL PROCEDURES: 1. Left hemiglossectomy with primary closure. 2. Left selective neck dissection (zones I-III).      03/02/2016 Imaging    CT chest with contrast: No evidence of metastatic disease in the chest. 2. Aortic atherosclerosis.       03/02/2016 Imaging    CT neck with contrast showed : Newly enlarged and round 10 mm  right level 1b lymph node (series 5, image 45). The level 1A nodes remain normal. This right 1 B node is nonspecific but suspicious for progressive nodal metastatic disease. It might be amenable to Ultrasound-guided FNA. 2. Interval left hemiglossectomy and selective left neck dissection with confluent indeterminate soft tissue from the anterior left submandibular space through the left carotid space and obscuring the left level 1B, level 2, and level IIIa nodal stations. This study will serve as a new postoperative baseline of this area. Attention directed on close interval follow-up. 3. Mild soft tissue thickening also along the left lateral tongue resection margin. Attention directed on followup. 4. Chest CT today reported separately      03/14/2016 Procedure    She has normal baseline hearing test      03/15/2016 Procedure    1. Successful placement of a right internal jugular approach power injectable Port-A-Cath. The Port a catheter is ready for immediate use. 2. Successful fluoroscopic insertion of a 20-French pull-through gastrostomy tube. The gastrostomy may be used immediately for medication administration and may be utilized in 24 hrs for the initiation of feeds.      03/15/2016 - 04/30/2016 Radiation Therapy    Tongue and Bilateral Neck treated to 68 Gy in 33 fractions      03/16/2016 - 04/27/2016 Chemotherapy    She received weekly cisplatin       04/04/2016 Adverse Reaction    Chemotherapy is placed on hold due to acute renal failure      04/13/2016 Adverse Reaction    Chemo was placed on hold due to shingles outbreak and paronychia      05/29/2016 - 06/04/2016 Hospital Admission    She was admitted  to the hospital for management of nausea, vomiting and inability to tolerate tube feeding      06/06/2016 - 06/13/2016 Hospital Admission    She was admitted to the hospital for management of nausea, vomiting and inability to tolerate tube feeding       INTERVAL HISTORY: Please see  below for problem oriented charting. She returns for further follow-up She has only lost 2 pounds since I last saw her She is still experiencing some pain when she tries to swallow She is able to tolerate mashed potato and soft diet without significant choking She is still dependent on at least 3 cans of nutritional supplement daily plus Ensure to maintain her weight She only uses Dilaudid twice a day for pain No recent nausea, vomiting or constipation She denies recent infection She denies worsening depression She is concerned about abnormal changes noted at the base of her neck with discomfort when she tries to turn her head  REVIEW OF SYSTEMS:   Constitutional: Denies fevers, chills  Eyes: Denies blurriness of vision Respiratory: Denies cough, dyspnea or wheezes Cardiovascular: Denies palpitation, chest discomfort or lower extremity swelling Gastrointestinal:  Denies nausea, heartburn or change in bowel habits Skin: Denies abnormal skin rashes Lymphatics: Denies new lymphadenopathy or easy bruising Neurological:Denies numbness, tingling or new weaknesses Behavioral/Psych: Mood is stable, no new changes  All other systems were reviewed with the patient and are negative.  I have reviewed the past medical history, past surgical history, social history and family history with the patient and they are unchanged from previous note.  ALLERGIES:  has No Known Allergies.  MEDICATIONS:  Current Outpatient Prescriptions  Medication Sig Dispense Refill  . Amino Acids-Protein Hydrolys (FEEDING SUPPLEMENT, PRO-STAT SUGAR FREE 64,) LIQD Place 30 mLs into feeding tube daily. 900 mL 0  . chlorhexidine (PERIDEX) 0.12 % solution Rinse with 15 mls three times daily for 30 seconds. Use after breakfast, dinner, and at bedtime. Spit out excess. Do not swallow. 480 mL prn  . HYDROmorphone HCl (DILAUDID) 1 MG/ML LIQD Place 4 mLs (4 mg total) into feeding tube every 3 (three) hours as needed for moderate  pain or severe pain. 473 mL 0  . levothyroxine (SYNTHROID, LEVOTHROID) 75 MCG tablet Take 75 mcg by mouth daily before breakfast.     . lidocaine-prilocaine (EMLA) cream Apply to affected area once 30 g 3  . LORazepam (ATIVAN) 1 MG tablet TAKE 1 TABLET BY MOUTH EVERY 8 HOURS AS NEEDED FOR ANXIETY OR SLEEP 60 tablet 0  . mirtazapine (REMERON SOL-TAB) 15 MG disintegrating tablet Take 1 tablet (15 mg total) by mouth at bedtime. 30 tablet 9  . nystatin (MYCOSTATIN) 100000 UNIT/ML suspension Use as directed 5 mLs (500,000 Units total) in the mouth or throat 4 (four) times daily. 60 mL 0  . OLANZapine (ZYPREXA) 5 MG tablet Place 1 tablet (5 mg total) into feeding tube at bedtime. 30 tablet 9  . ondansetron (ZOFRAN ODT) 8 MG disintegrating tablet Take 1 tablet (8 mg total) by mouth every 8 (eight) hours as needed for nausea or vomiting. 30 tablet 5  . polyethylene glycol (MIRALAX / GLYCOLAX) packet Place 17 g into feeding tube daily. 14 each 0  . prochlorperazine (COMPAZINE) 10 MG tablet TAKE 1 TABLET BY MOUTH EVERY 6 HOURS AS NEEDED FOR NAUSEA AND VOMITING 30 tablet 1  . senna (SENOKOT) 8.6 MG tablet Take 1 tablet by mouth 2 (two) times daily.     No current facility-administered medications for this visit.  Facility-Administered Medications Ordered in Other Visits  Medication Dose Route Frequency Provider Last Rate Last Dose  . heparin lock flush 100 unit/mL  500 Units Intracatheter Once PRN Alvy Bimler, Caiya Bettes, MD      . sodium chloride flush (NS) 0.9 % injection 10 mL  10 mL Intracatheter PRN Alvy Bimler, Amanii Snethen, MD        PHYSICAL EXAMINATION: ECOG PERFORMANCE STATUS: 1 - Symptomatic but completely ambulatory  Vitals:   08/03/16 1219  BP: 119/69  Pulse: (!) 117  Resp: (!) 22  Temp: 99.6 F (37.6 C)   Filed Weights   08/03/16 1219  Weight: 180 lb 12.8 oz (82 kg)    GENERAL:alert, no distress and comfortable SKIN: skin color, texture, turgor are normal, no rashes or significant lesions EYES:  normal, Conjunctiva are pink and non-injected, sclera clear OROPHARYNX:no exudate, no erythema and lips, buccal mucosa, and tongue exam revealed postsurgical change, no mucositis or thrush NECK: The base of her floor of the mouth is hard to palpation, likely due to fibrosis from radiation.  Noted acquired lymphedema LYMPH:  no palpable lymphadenopathy in the cervical, axillary or inguinal LUNGS: clear to auscultation and percussion with normal breathing effort HEART: regular rate & rhythm and no murmurs and no lower extremity edema ABDOMEN:abdomen soft, non-tender and normal bowel sounds.  Feeding tube site looks okay Musculoskeletal:no cyanosis of digits and no clubbing  NEURO: alert & oriented x 3 with persistent dysarthria, no focal motor/sensory deficits  LABORATORY DATA:  I have reviewed the data as listed    Component Value Date/Time   NA 138 07/16/2016 1122   K 3.8 07/16/2016 1122   CL 101 06/12/2016 0540   CO2 28 07/16/2016 1122   GLUCOSE 113 07/16/2016 1122   BUN 30.1 (H) 07/16/2016 1122   CREATININE 1.3 (H) 07/16/2016 1122   CALCIUM 10.0 07/16/2016 1122   PROT 7.8 07/16/2016 1122   ALBUMIN 3.5 07/16/2016 1122   AST 26 07/16/2016 1122   ALT 14 07/16/2016 1122   ALKPHOS 89 07/16/2016 1122   BILITOT 0.59 07/16/2016 1122   GFRNONAA 56 (L) 06/12/2016 0540   GFRAA >60 06/12/2016 0540    No results found for: SPEP, UPEP  Lab Results  Component Value Date   WBC 5.4 07/16/2016   NEUTROABS 4.4 07/16/2016   HGB 10.3 (L) 07/16/2016   HCT 29.4 (L) 07/16/2016   MCV 90.5 07/16/2016   PLT 220 07/16/2016      Chemistry      Component Value Date/Time   NA 138 07/16/2016 1122   K 3.8 07/16/2016 1122   CL 101 06/12/2016 0540   CO2 28 07/16/2016 1122   BUN 30.1 (H) 07/16/2016 1122   CREATININE 1.3 (H) 07/16/2016 1122      Component Value Date/Time   CALCIUM 10.0 07/16/2016 1122   ALKPHOS 89 07/16/2016 1122   AST 26 07/16/2016 1122   ALT 14 07/16/2016 1122   BILITOT  0.59 07/16/2016 1122       ASSESSMENT & PLAN:  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[place]) Overall, I felt that the patient is improving in the right direction She is not sure about feeding tube removal as she is still dependent on the feeding tube for caloric needs although she is able to swallow I recommend keeping the feeding tube possibly into the end of the month before we decide to get it removed We will get her referred back to see ENT for follow-up   Acquired lymphedema She has significant acquired  lymphedema at the base of the neck I will refer her back to physical therapist for neck exercises  Cancer associated pain Her pain is improving She is only used Dilaudid on a rare basis We will continue to same for now with plan to taper her off pain medicine  Dysarthria She has profound dysarthria I recommend close follow-up with speech therapist  Depression due to physical illness Her depression is under control She will continue Remeron and olanzapine  Dysphagia She has persistent dysphagia and has not been doing her neck exercises as directed Overall, her dysphagia is improving and she is able to tolerate some soft diet I recommend she transition most of her diet by mouth with the aim to remove the feeding tube at the end of the month if possible She will continue close follow-up with speech and language therapist   Orders Placed This Encounter  Procedures  . Ambulatory Referral to Physical Therapy    Referral Priority:   Routine    Referral Type:   Physical Medicine    Referral Reason:   Specialty Services Required    Requested Specialty:   Physical Therapy    Number of Visits Requested:   1   All questions were answered. The patient knows to call the clinic with any problems, questions or concerns. No barriers to learning was detected. I spent 25 minutes counseling the patient face to face. The total time spent in the appointment was 30 minutes and more than  50% was on counseling and review of test results     [name], MD 08/03/2016 5:39 PM

## 2016-08-03 NOTE — Assessment & Plan Note (Signed)
She has significant acquired lymphedema at the base of the neck I will refer her back to physical therapist for neck exercises

## 2016-08-03 NOTE — Assessment & Plan Note (Signed)
Overall, I felt that the patient is improving in the right direction She is not sure about feeding tube removal as she is still dependent on the feeding tube for caloric needs although she is able to swallow I recommend keeping the feeding tube possibly into the end of the month before we decide to get it removed We will get her referred back to see ENT for follow-up

## 2016-08-03 NOTE — Telephone Encounter (Signed)
Scheduled appt per 8/3 los - Gave patient AVS and calender per los.  

## 2016-08-03 NOTE — Assessment & Plan Note (Signed)
Her depression is under control She will continue Remeron and olanzapine

## 2016-08-03 NOTE — Progress Notes (Signed)
  Tiffany Yu presents for follow up of radiation completed 04/30/16 to her Tongue and bilateral neck.   Pain issues, if any: She reports pain to the right side of her jaw area. She will take 4 mg liquid dilaudid daily to every other day for this pain with relief.  Using a feeding tube? Yes, 3 Vital daily and 1-2 boost or ensure daily.  Weight changes, if any:  Wt Readings from Last 3 Encounters:  08/10/16 179 lb 9.6 oz (81.5 kg)  08/03/16 180 lb 12.8 oz (82 kg)  07/16/16 183 lb 11.2 oz (83.3 kg)   Swallowing issues, if any: She is drinking water and softer foods such as potatoes, broth, pudding. When she has tried other types of food it is not able to go down.  Smoking or chewing tobacco? no Using fluoride trays daily? no Last ENT visit was on: Dr. Wilburn Cornelia not since 03/20/16. Other notable issues, if any:  She has swelling to her neck. She has an evaluation for Lymph PT next week.  CT neck/chest 08/09/16  BP 119/68   Pulse (!) 111   Temp 99 F (37.2 C)   Ht 5\' 8"  (1.727 m)   Wt 179 lb 9.6 oz (81.5 kg)   SpO2 99% Comment: room air  BMI 27.31 kg/m

## 2016-08-07 MED FILL — OLANZapine 5 MG TABS: 5 | 30 days supply | Qty: 30 | Fill #2

## 2016-08-07 MED FILL — MIRTAZAPINE 15 MG ODT: 15 | 30 days supply | Qty: 30 | Fill #1

## 2016-08-09 ENCOUNTER — Encounter (HOSPITAL_COMMUNITY): Payer: Self-pay

## 2016-08-09 ENCOUNTER — Ambulatory Visit (HOSPITAL_COMMUNITY)
Admission: RE | Admit: 2016-08-09 | Discharge: 2016-08-09 | Disposition: A | Discharge status: Home / Self Care | Payer: 59 | Source: Ambulatory Visit | Attending: Radiation Oncology | Admitting: Radiation Oncology

## 2016-08-09 DIAGNOSIS — R634 Abnormal weight loss: Secondary | ICD-10-CM

## 2016-08-09 DIAGNOSIS — C023 Malignant neoplasm of anterior two-thirds of tongue, part unspecified: Secondary | ICD-10-CM

## 2016-08-09 DIAGNOSIS — R918 Other nonspecific abnormal finding of lung field: Secondary | ICD-10-CM | POA: Insufficient documentation

## 2016-08-09 DIAGNOSIS — E041 Nontoxic single thyroid nodule: Secondary | ICD-10-CM | POA: Diagnosis not present

## 2016-08-09 DIAGNOSIS — Z1329 Encounter for screening for other suspected endocrine disorder: Secondary | ICD-10-CM

## 2016-08-09 HISTORY — DX: Malignant (primary) neoplasm, unspecified: C80.1

## 2016-08-09 MED ORDER — IOPAMIDOL (ISOVUE-300) INJECTION 61%
75.0000 mL | Freq: Once | INTRAVENOUS | Status: AC | PRN
  Administered 2016-08-09: 75 mL via INTRAVENOUS

## 2016-08-09 MED ORDER — IOPAMIDOL (ISOVUE-300) INJECTION 61%
INTRAVENOUS | Status: AC
  Filled 2016-08-09: qty 75

## 2016-08-10 ENCOUNTER — Encounter: Payer: Self-pay | Admitting: Radiation Oncology

## 2016-08-10 ENCOUNTER — Encounter: Payer: Self-pay | Admitting: *Deleted

## 2016-08-10 ENCOUNTER — Ambulatory Visit
Admission: RE | Admit: 2016-08-10 | Discharge: 2016-08-10 | Disposition: A | Discharge status: Home / Self Care | Payer: 59 | Source: Ambulatory Visit | Attending: Radiation Oncology | Admitting: Radiation Oncology

## 2016-08-10 ENCOUNTER — Telehealth: Payer: Self-pay | Admitting: Hematology and Oncology

## 2016-08-10 VITALS — BP 119/68 | HR 111 | Temp 99.0°F | Ht 68.0 in | Wt 179.6 lb

## 2016-08-10 DIAGNOSIS — R6889 Other general symptoms and signs: Secondary | ICD-10-CM

## 2016-08-10 DIAGNOSIS — C023 Malignant neoplasm of anterior two-thirds of tongue, part unspecified: Secondary | ICD-10-CM | POA: Diagnosis not present

## 2016-08-10 DIAGNOSIS — Z08 Encounter for follow-up examination after completed treatment for malignant neoplasm: Secondary | ICD-10-CM | POA: Diagnosis not present

## 2016-08-10 DIAGNOSIS — Z79899 Other long term (current) drug therapy: Secondary | ICD-10-CM | POA: Diagnosis not present

## 2016-08-10 DIAGNOSIS — Z923 Personal history of irradiation: Secondary | ICD-10-CM | POA: Diagnosis not present

## 2016-08-10 DIAGNOSIS — Z9221 Personal history of antineoplastic chemotherapy: Secondary | ICD-10-CM | POA: Diagnosis not present

## 2016-08-10 DIAGNOSIS — R918 Other nonspecific abnormal finding of lung field: Secondary | ICD-10-CM | POA: Diagnosis not present

## 2016-08-10 NOTE — Telephone Encounter (Signed)
lvm to inform pt of 8/15 at 1330 MD appt to review scan results T

## 2016-08-10 NOTE — Progress Notes (Signed)
Radiation Oncology         [phone] ________________________________  Name: Tiffany Yu MRN: [id number]  Date: 08/10/2016  DOB: 11-Nov-1955  Follow-Up Visit Note  CC: Bernerd Limbo, MD  Jerrell Belfast, MD  Diagnosis and Prior Radiotherapy:       ICD-10-CM   1. Cold intolerance R68.89 TSH  2. 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  Since the pt last visit, she had a CT chest with contrast completed on 08/09/16 with results revealing: 1. New bilateral upper lobe ground-glass nodules. Favor infectious or inflammatory process. 2. Angular nodule RIGHT upper lobe is also favored infectious or inflammatory. 3. No mediastinal or supraclavicular lymphadenopathy. 4. Small RIGHT thyroid nodule does not meet biopsy criteria.  Pt also had a CT soft tissue neck with contrast completed on 08/09/16 with results revealing: IMPRESSION: 1. Progression of disease with new/markedly worsened mass of the left base of tongue measuring 2.9 x 2.2 x 2.3 centimeters. Persistent soft tissue thickening of the postoperative left neck extending from the angle of the mandible to the level of the hyoid bone. 2. Confluent, centrally necrotic mass in the right submental region, at the site of previously seen enlarged level 1B lymph node. This mass extends into the floor of the mouth and displaces or invades the right sublingual gland and the right mylohyoid and hyoglossus muscles. 3. Centrally necrotic right level IIa ymph node is new from the prior study and consistent with metastatic spread. No other  lower cervical lymphadenopathy. 4. Multiple ground-glass opacities in the lung apices. 5. Thickening of the epiglottis and aryepiglottic folds is likely secondary to prior radiation treatment. No acute airway compromise.  Pt returns to the office today accompanied by her husband. Pt notes that she has a PT appointment next week. Pt reports that she hasn't had any issues since her last visit. She denies tongue pain or swelling.   On review of systems, she reports she has right sided jaw pain and swelling that she is taking liquid 4 mL dialudid daily.        ALLERGIES:  has No Known Allergies.  Meds: Current Outpatient Prescriptions  Medication Sig Dispense Refill  . Amino Acids-Protein Hydrolys (FEEDING SUPPLEMENT, PRO-STAT SUGAR FREE 64,) LIQD Place 30 mLs into feeding tube daily. 900 mL 0  . chlorhexidine (PERIDEX) 0.12 % solution Rinse with 15 mls three times daily for 30 seconds. Use after breakfast, dinner, and at bedtime. Spit out excess. Do not swallow. 480 mL prn  . HYDROmorphone HCl (DILAUDID) 1 MG/ML LIQD Place 4 mLs (4 mg total) into feeding tube every 3 (three) hours as needed for moderate pain or severe pain. 473 mL 0  . levothyroxine (SYNTHROID, LEVOTHROID) 75 MCG tablet Take 75 mcg by mouth daily before breakfast.     . lidocaine-prilocaine (EMLA) cream Apply to affected area once 30 g 3  . LORazepam (ATIVAN) 1 MG tablet TAKE 1 TABLET BY MOUTH EVERY 8 HOURS AS NEEDED FOR ANXIETY OR SLEEP 60 tablet 0  . mirtazapine (REMERON SOL-TAB) 15 MG disintegrating tablet Take  1 tablet (15 mg total) by mouth at bedtime. 30 tablet 9  . nystatin (MYCOSTATIN) 100000 UNIT/ML suspension Use as directed 5 mLs (500,000 Units total) in the mouth or throat 4 (four) times daily. 60 mL 0  . polyethylene glycol (MIRALAX / GLYCOLAX) packet Place 17 g into feeding tube daily. 14 each 0  . OLANZapine (ZYPREXA) 5 MG tablet Place 1 tablet (5 mg total) into feeding tube at bedtime. (Patient not taking: Reported  on 08/10/2016) 30 tablet 9  . ondansetron (ZOFRAN ODT) 8 MG disintegrating tablet Take 1 tablet (8 mg total) by mouth every 8 (eight) hours as needed for nausea or vomiting. (Patient not taking: Reported on 08/10/2016) 30 tablet 5  . prochlorperazine (COMPAZINE) 10 MG tablet TAKE 1 TABLET BY MOUTH EVERY 6 HOURS AS NEEDED FOR NAUSEA AND VOMITING (Patient not taking: Reported on 08/10/2016) 30 tablet 1  . senna (SENOKOT) 8.6 MG tablet Take 1 tablet by mouth 2 (two) times daily.     No current facility-administered medications for this encounter.    Facility-Administered Medications Ordered in Other Encounters  Medication Dose Route Frequency Provider Last Rate Last Dose  . heparin lock flush 100 unit/mL  500 Units Intracatheter Once PRN Alvy Bimler, Ni, MD      . sodium chloride flush (NS) 0.9 % injection 10 mL  10 mL Intracatheter PRN Alvy Bimler, Ni, MD       REVIEW OF SYSTEMS: A 10+ POINT REVIEW OF SYSTEMS WAS OBTAINED including neurology, dermatology, psychiatry, cardiac, respiratory, lymph, extremities, GI, GU, musculoskeletal, constitutional, reproductive, HEENT. All pertinent positives are noted in the HPI. All others are negative.  Physical Findings: The patient is tearful. Patient is alert and oriented. Wt Readings from Last 3 Encounters:  08/10/16 179 lb 9.6 oz (81.5 kg)  08/03/16 180 lb 12.8 oz (82 kg)  07/16/16 183 lb 11.2 oz (83.3 kg)    height is 5\' 8"  (1.727 m) and weight is 179 lb 9.6 oz (81.5 kg). Her temperature is 99 F (37.2 C). Her blood pressure is 119/68 and her pulse is 111 (abnormal). Her oxygen saturation is 99%. Marland Kitchen   HEENT: Patient is edentulous. Dentures removed for exam.  brisk gag reflex. No obvious oropharyngeal lesions. Firmness in posterior lateral left tongue, but is not obvious tumor. Non specific white appearance to tongue Neck: Lymphedema in upper neck. Tender, firm, swelling concentrated in level 1B region of right neck. Lymphedema makes it difficult to appreciate  discrete masses. Neck is notable for well healed skin.  Skin: Skin in treatment fields shows satisfactory healing. Heart is regular in rhythm, rate is slightly tachycardic. Chest: Lungs clear to ausculation bilaterally.    Lab Findings: Lab Results  Component Value Date   WBC 5.4 07/16/2016   HGB 10.3 (L) 07/16/2016   HCT 29.4 (L) 07/16/2016   MCV 90.5 07/16/2016   PLT 220 07/16/2016    Lab Results  Component Value Date   TSH 6.595 (H) 03/02/2016    Radiographic Findings: Ct Soft Tissue Neck W Contrast  Result Date: 08/09/2016 CLINICAL DATA:  Squamous cell carcinoma of the tongue. Completion of chemo radiation in May of 2018. EXAM: CT NECK WITH CONTRAST TECHNIQUE: Multidetector CT imaging of the neck was performed using the standard protocol following the bolus administration of intravenous contrast. CONTRAST:  3mL ISOVUE-300 IOPAMIDOL (ISOVUE-300) INJECTION 61% COMPARISON:  CT neck 03/02/2016 FINDINGS: Pharynx and larynx: --Nasopharynx: Fossae of Rosenmuller are clear. Normal adenoid tonsils for age. --Oral cavity and oropharynx: At the left base  of tongue there is a mass lesion measuring 2.9 x 2.2 x 2.3 cm (AP x Transverse x CC). The degree of conspicuity is markedly increased compared to the more indistinct soft tissue in this region described on the prior study. There is persistent lower left facial and left neck soft tissue thickening extending from the ankle of the mandible to the level of the hyoid bone, which is unchanged. --Hypopharynx: The epiglottis is thickened, likely secondary to radiation treatment. There is no hypopharyngeal airway compromise. There is mild crowding of the left vallecula due to mass effect from the above-described process. --Larynx: There is thickening of the aryepiglottic folds without discrete lesion. The vocal cords appear normal. --Retropharyngeal space: No abscess, effusion or lymphadenopathy. Salivary glands: --Parotid: No mass lesion or inflammation. No  sialolithiasis or ductal dilatation. --Submandibular: The left submandibular gland is surgically absent. There is intraglandular ductal dilatation, unchanged. --Sublingual: The left sublingual gland is not clearly identified. The expected location of the right sublingual gland is obscured or invaded by the below described right submental lesion. Thyroid: Cystic left thyroid nodule measures 12 mm. Lymph nodes: At the site of the previously identified enlarged right level 1 B lymph node, there is now a confluent, partially necrotic mass that measures approximately 4.0 x 3.0 cm. The mass extends into the right floor of mouth and displaces or invades the right mylohyoid and hyoglossus muscles. The right genioglossus muscle is deviated to the left. There is a centrally necrotic right level IIa lymph node measuring 8 millimeters. No other right-sided cervical lymphadenopathy. The patient is status post left neck dissection. There is no residual left cervical lymphadenopathy. Vascular: Major cervical vessels are patent. Limited intracranial: Normal. Visualized orbits: Normal. Mastoids and visualized paranasal sinuses: No fluid levels or advanced mucosal thickening. No mastoid effusion. Skeleton: No bony spinal canal stenosis. No lytic or blastic lesions. Upper chest: The lungs are more fully characterized on the concomitant dedicated chest CT. Within the visible lung apices there are multiple ground-glass opacities. Other: None. IMPRESSION: 1. Progression of disease with new/markedly worsened mass of the left base of tongue measuring 2.9 x 2.2 x 2.3 centimeters. Persistent soft tissue thickening of the postoperative left neck extending from the angle of the mandible to the level of the hyoid bone. 2. Confluent, centrally necrotic mass in the right submental region, at the site of previously seen enlarged level 1B lymph node. This mass extends into the floor of the mouth and displaces or invades the right sublingual gland  and the right mylohyoid and hyoglossus muscles. 3. Centrally necrotic right level IIa lymph node is new from the prior study and consistent with metastatic spread. No other lower cervical lymphadenopathy. 4. Multiple ground-glass opacities in the lung apices. Please see dedicated report for concomitant chest CT for more complete characterization. 5. Thickening of the epiglottis and aryepiglottic folds is likely secondary to prior radiation treatment. No acute airway compromise. Electronically Signed   By: Ulyses Jarred M.D.   On: 08/09/2016 16:43   Ct Chest W Contrast  Result Date: 08/09/2016 CLINICAL DATA:  Squamous cell carcinoma the tongue diagnosis December 2017 with chemo radiation therapy complete. EXAM: CT CHEST WITH CONTRAST TECHNIQUE: Multidetector CT imaging of the chest was performed during intravenous contrast administration. CONTRAST:  33mL ISOVUE-300 IOPAMIDOL (ISOVUE-300) INJECTION 61% COMPARISON:  Chest CT 03/02/2016, neck CT 08/09/2016 FINDINGS: Cardiovascular: No significant vascular findings. Normal heart size. No pericardial effusion. Mediastinum/Nodes: Port in the RIGHT chest. No axillary or supraclavicular adenopathy. No mediastinal hilar adenopathy.  No pericardial fluid. Small LEFT thyroid nodule measures 8 mm. Lungs/Pleura: Bilateral upper lobe ground-glass nodules which are new from prior. RIGHT upper lobe nodule measures 15 mm (image 36, series 11). LEFT upper lobe nodule measures 18 mm on image 36. Angular nodule in the RIGHT upper lobe measures 11 mm on image 58, series 11. Upper Abdomen: Peg tube in stomach.  Adrenal glands normal. Musculoskeletal: No aggressive osseous lesion IMPRESSION: 1. New bilateral upper lobe ground-glass nodules. Favor infectious or inflammatory process. Recommend close attention on routine follow-up. 2. Angular nodule RIGHT upper lobe is also favored infectious or inflammatory. Recommend attention on follow-up as above. 3. No mediastinal or supraclavicular  lymphadenopathy. 4. Small RIGHT thyroid nodule does not meet biopsy criteria. Please see separate CT neck dictation. Electronically Signed   By: Suzy Bouchard M.D.   On: 08/09/2016 16:23    Impression/Plan:    1) Head and Neck Cancer Status: Concerning for progressive head and neck disease. Pt will be reviewed at tumor board next week and referred back to ENT Specialist, Dr. Wilburn Cornelia. Her areas of suspected progression appear to fall within previous high dose radiation fields.   2) Nutritional Status: stable PEG tube: Yes, continuous nightly feeds (3 Vital daily and 1-2 boost or ensure daily)  3) Risk Factors: The patient has been educated about risk factors including alcohol and tobacco abuse; they understand that avoidance of alcohol and tobacco is important to prevent recurrences as well as other cancers. Pt notes that she is not smoking cigarettes at this time.  4) Swallowing: She is drinking water and softer foods (potatoes, broth, pudding). When she attempts to swallow other types of food, it will not go down.   5) Dental: . Pt has been on antifungal Rx for tongue, since there is still a white coating it is unlikely be thrush, pt will use soft bristly brush.   6) Thyroid function: TSH labs pending today. We will call the patient if there is a need to start a supplement. Lab Results  Component Value Date   TSH 6.595 (H) 03/02/2016    7) Other: [name], RN, our Head and Neck Oncology Navigator, called Dr. Alvy Bimler schedulers today and was informed that the pt would be placed on the closet appointment next week with Dr. Alvy Bimler for evaluation. I advise that the pt follow up with Dr. Alvy Bimler for a follow up appointment to discuss possible chemotherapy or immunotherapy treatment. Liliane Channel, will also call PT and inquire if the patient should delay PT appointment due to new findings.  He will arrange f/u ASAP with Dr Wilburn Cornelia. Added patient to tumor board.  I will follow up with the patient  PRN.  Emotional support given today.  This was difficult news to deliver, and receive - she has fought her cancer so courageously. We need more information to prove if this is a recurrence, but I am highly concerned.   _____________________________________   Eppie Gibson, MD  This document serves as a record of services personally performed by Eppie Gibson, MD. It was created on her behalf by Steva Colder, a trained medical scribe. The creation of this record is based on the scribe's personal observations and the provider's statements to them. This document has been checked and approved by the attending provider.

## 2016-08-10 NOTE — Progress Notes (Signed)
Oncology Nurse Navigator Documentation  To provide support, encouragement and care continuity, met with Tiffany Yu and her husband during follow-up with Dr. Isidore Moos.  She completed adjuvant RT 04/30/16 and weekly Cisplatin 04/27/16 for surgically treated lateral tongue carcinoma. They voiced understanding of unfavorable scan results, recommendation for follow-up appts with ENT Dr. Wilburn Cornelia and Dr. Alvy Bimler which has already been scheduled for next week Wednesday.  They understanding I will coordinate on Monday next available appt with Dr. Wilburn Cornelia.  They understand I will follow-up with PT re appropriateness of next week Tuesday's PT Lymphedema appt in context of today's discussion. I provided emotional support, encouragement, assured them I will continue to be available them during this next phase, encouraged them to call with needs/concerns.  [name], RN, BSN, [place] [facility] Oncology Nurse [place] at [place] [phone]

## 2016-08-10 NOTE — Progress Notes (Signed)
Paperwork ([employer]) received for patients (caregiver) 08/10/16, given to nurse 08/10/16

## 2016-08-13 ENCOUNTER — Telehealth: Payer: Self-pay | Admitting: *Deleted

## 2016-08-13 LAB — TSH: TSH: 3.115 m[IU]/L (ref 0.308–3.960)

## 2016-08-13 NOTE — Telephone Encounter (Signed)
Oncology Nurse [place] [facility] to coordinate ASAP appt with Dr. Wilburn Cornelia, spoke with Carlisle Cater, received appt for tomorrow 2:00.  LVMM on patient's home and mobile with information, spoke with husband, provided information including reminder of 1:30 appt with Dr. Alvy Bimler on Wed.  Also informed him 8/14 1:45 PT appt and 8/20 9:30 SLP appts are being cancelled and will be rescheduled when appropriate.  He voiced understanding.  [name], RN, BSN, [place] [facility] Oncology Nurse [place] at [place] [phone]

## 2016-08-14 ENCOUNTER — Ambulatory Visit: Payer: 59 | Admitting: Physical Therapy

## 2016-08-15 ENCOUNTER — Encounter: Payer: Self-pay | Admitting: *Deleted

## 2016-08-15 ENCOUNTER — Telehealth: Payer: Self-pay | Admitting: Hematology and Oncology

## 2016-08-15 ENCOUNTER — Ambulatory Visit (HOSPITAL_BASED_OUTPATIENT_CLINIC_OR_DEPARTMENT_OTHER): Payer: 59 | Admitting: Hematology and Oncology

## 2016-08-15 VITALS — BP 120/68 | HR 113 | Temp 99.6°F | Resp 18 | Ht 68.0 in | Wt 180.2 lb

## 2016-08-15 DIAGNOSIS — R471 Dysarthria and anarthria: Secondary | ICD-10-CM

## 2016-08-15 DIAGNOSIS — C023 Malignant neoplasm of anterior two-thirds of tongue, part unspecified: Secondary | ICD-10-CM

## 2016-08-15 DIAGNOSIS — G893 Neoplasm related pain (acute) (chronic): Secondary | ICD-10-CM

## 2016-08-15 DIAGNOSIS — R1310 Dysphagia, unspecified: Secondary | ICD-10-CM | POA: Diagnosis not present

## 2016-08-15 MED ORDER — HYDROMORPHONE HCL 1 MG/ML PO LIQD: 4.0000 mg | ORAL | 0 refills | Status: DC | PRN

## 2016-08-15 MED FILL — HYDROMORPHONE 5 MG/5 ML SOL: 1 | 14 days supply | Qty: 473 | Fill #0

## 2016-08-15 NOTE — Telephone Encounter (Signed)
Gave patient avs and calendar for appts.  °

## 2016-08-15 NOTE — Progress Notes (Signed)
Oncology Nurse Navigator Documentation  To provide support, encouragement and care continuity, met with Ms. Trela during Established Patient appt with Dr. Alvy Bimler.  She was accompanied by her husband.  They voiced understanding of Dr. Calton Dach discussion/review of recent CT Neck scan, need for further diagnostics with PET scan; chemotherapy/immunotherapy options pending PET results.  They agreed to have PET conducted at [facility] to expedite scheduling, voiced understanding of follow-up with Dr. Alvy Bimler next Wed.  She noted she continues to have supplies delivered for feeding pump which she is no longer using, she bolus feeds only.   She stated she needs continued delivery of Vital 1.5 and syringes.  I indicated I would communicate needs to Dory Peru, RD. They understand to call me needs/concerns.  [name], RN, BSN, [place] [facility] Oncology Nurse [place] at [place] [phone]

## 2016-08-16 ENCOUNTER — Encounter: Payer: Self-pay | Admitting: Hematology and Oncology

## 2016-08-16 ENCOUNTER — Encounter: Payer: Self-pay | Admitting: Nutrition

## 2016-08-16 DIAGNOSIS — C029 Malignant neoplasm of tongue, unspecified: Secondary | ICD-10-CM

## 2016-08-16 DIAGNOSIS — C023 Malignant neoplasm of anterior two-thirds of tongue, part unspecified: Secondary | ICD-10-CM

## 2016-08-16 MED ORDER — VITAL 1.5 CAL PO LIQD: ORAL | 4 refills | Status: AC

## 2016-08-16 NOTE — Progress Notes (Signed)
[facility] OFFICE PROGRESS NOTE  Patient Care Team: Bernerd Limbo, MD as PCP - General (Family Medicine) Jerrell Belfast, MD as Consulting Physician (Otolaryngology) Eppie Gibson, MD as Attending Physician (Radiation Oncology) [name], MD as Consulting Physician (Hematology and Oncology) Leota Sauers, RN as Oncology Nurse Navigator (Oncology)  SUMMARY OF ONCOLOGIC HISTORY:   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[place])   02/08/2016 Pathology Results    Diagnosis 1. 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 Tongue, biopsy, Anterior deep margin left SQUAMOUS CELL CARCINOMA 3. Tongue, biopsy, Posterior deep margin left NEGATIVE FOR CARCINOMA 4. Tongue, biopsy, Posterior margin left SQUAMOUS CELL CARCINOMA 5. Tongue, excisional biopsy, New anterior deep margin NEGATIVE FOR CARCINOMA 6. Tongue, excisional biopsy, New posterior deep margin SMALL FOCUS OF SQUAMOUS CELL CARCINOMA (0.1 CM, ONLY PRESENT AT THE DEEPER PERMINENT SECTION) 7. Tongue, resection for tumor, Left lateral INFILTRATIVE KERATINIZED SQUAMOUS CELL CARCINOMA (3.2 CM) THE TUMOR INVADES UNDERNEATH MUSCLE OF THE TONGUE (2.0 CM, PT2) LYMPHOVASCULAR AND PERINEURAL INVASION IDENTIFIED SQUAMOUS CELL CARCINOMA PRESENTED AT (FINAL MARGINS REFER TO PART 2-6) LATERAL INKED MARGIN (1.0 CM) MEDIAL INKED MARGIN (0.6 CM)      02/08/2016 Surgery    SURGICAL PROCEDURES: 1. Left hemiglossectomy with primary closure. 2. Left selective neck dissection (zones I-III).      03/02/2016 Imaging    CT chest with contrast: No evidence of metastatic disease in the chest. 2. Aortic atherosclerosis.       03/02/2016 Imaging    CT neck with contrast showed : Newly enlarged and round 10 mm  right level 1b lymph node (series 5, image 45). The level 1A nodes remain normal. This right 1 B node is nonspecific but suspicious for progressive nodal metastatic disease. It might be amenable to Ultrasound-guided FNA. 2. Interval left hemiglossectomy and selective left neck dissection with confluent indeterminate soft tissue from the anterior left submandibular space through the left carotid space and obscuring the left level 1B, level 2, and level IIIa nodal stations. This study will serve as a new postoperative baseline of this area. Attention directed on close interval follow-up. 3. Mild soft tissue thickening also along the left lateral tongue resection margin. Attention directed on followup. 4. Chest CT today reported separately      03/14/2016 Procedure    She has normal baseline hearing test      03/15/2016 Procedure    1. Successful placement of a right internal jugular approach power injectable Port-A-Cath. The Port a catheter is ready for immediate use. 2. Successful fluoroscopic insertion of a 20-French pull-through gastrostomy tube. The gastrostomy may be used immediately for medication administration and may be utilized in 24 hrs for the initiation of feeds.      03/15/2016 - 04/30/2016 Radiation Therapy    Tongue and Bilateral Neck treated to 68 Gy in 33 fractions      03/16/2016 - 04/27/2016 Chemotherapy    She received weekly cisplatin. Cycle 2-6 reduced dose at 20 mg/m2 due to significant side-effects and poor tolerance       04/04/2016 Adverse Reaction    Chemotherapy is placed on hold due to acute renal failure      04/13/2016 Adverse Reaction    Chemo was placed on hold due to shingles outbreak and paronychia  05/29/2016 - 06/04/2016 Hospital Admission    She was admitted to the hospital for management of nausea, vomiting and inability to tolerate tube feeding      06/06/2016 - 06/13/2016 Hospital Admission    She was admitted to the hospital for management of nausea,  vomiting and inability to tolerate tube feeding      08/09/2016 Imaging    1. New bilateral upper lobe ground-glass nodules. Favor infectious or inflammatory process. Recommend close attention on routine follow-up. 2. Angular nodule RIGHT upper lobe is also favored infectious or inflammatory. Recommend attention on follow-up as above. 3. No mediastinal or supraclavicular lymphadenopathy. 4. Small RIGHT thyroid nodule does not meet biopsy criteria. Please see separate CT neck dictation.      08/09/2016 Imaging    1. Progression of disease with new/markedly worsened mass of the left base of tongue measuring 2.9 x 2.2 x 2.3 centimeters. Persistent soft tissue thickening of the postoperative left neck extending from the angle of the mandible to the level of the hyoid bone. 2. Confluent, centrally necrotic mass in the right submental region, at the site of previously seen enlarged level 1B lymph node. This mass extends into the floor of the mouth and displaces or invades the right sublingual gland and the right mylohyoid and hyoglossus muscles. 3. Centrally necrotic right level IIa lymph node is new from the prior study and consistent with metastatic spread. No other lower cervical lymphadenopathy. 4. Multiple ground-glass opacities in the lung apices. Please see dedicated report for concomitant chest CT for more complete characterization. 5. Thickening of the epiglottis and aryepiglottic folds is likely secondary to prior radiation treatment. No acute airway compromise.       INTERVAL HISTORY: Please see below for problem oriented charting. She returns with her husband for further follow-up Her case was presented at the tumor board She was noted to have abnormal imaging study at the CT scan of the neck, worrisome for local recurrence She also is noted to have mild nodule on bilateral upper lungs She denies cough, chest pain or shortness of breath Her weight is stable She denies worsening  pain She continues to have this dysphagia and dysarthria.  REVIEW OF SYSTEMS:   Constitutional: Denies fevers, chills or abnormal weight loss Eyes: Denies blurriness of vision Ears, nose, mouth, throat, and face: Denies mucositis or sore throat Respiratory: Denies cough, dyspnea or wheezes Cardiovascular: Denies palpitation, chest discomfort or lower extremity swelling Gastrointestinal:  Denies nausea, heartburn or change in bowel habits Skin: Denies abnormal skin rashes Lymphatics: Denies new lymphadenopathy or easy bruising Neurological:Denies numbness, tingling or new weaknesses Behavioral/Psych: Mood is stable, no new changes  All other systems were reviewed with the patient and are negative.  I have reviewed the past medical history, past surgical history, social history and family history with the patient and they are unchanged from previous note.  ALLERGIES:  has No Known Allergies.  MEDICATIONS:  Current Outpatient Prescriptions  Medication Sig Dispense Refill  . Amino Acids-Protein Hydrolys (FEEDING SUPPLEMENT, PRO-STAT SUGAR FREE 64,) LIQD Place 30 mLs into feeding tube daily. 900 mL 0  . chlorhexidine (PERIDEX) 0.12 % solution Rinse with 15 mls three times daily for 30 seconds. Use after breakfast, dinner, and at bedtime. Spit out excess. Do not swallow. 480 mL prn  . HYDROmorphone HCl (DILAUDID) 1 MG/ML LIQD Place 4 mLs (4 mg total) into feeding tube every 3 (three) hours as needed for moderate pain or severe pain. 473 mL 0  . levothyroxine (  SYNTHROID, LEVOTHROID) 75 MCG tablet Take 75 mcg by mouth daily before breakfast.     . lidocaine-prilocaine (EMLA) cream Apply to affected area once 30 g 3  . LORazepam (ATIVAN) 1 MG tablet TAKE 1 TABLET BY MOUTH EVERY 8 HOURS AS NEEDED FOR ANXIETY OR SLEEP 60 tablet 0  . mirtazapine (REMERON SOL-TAB) 15 MG disintegrating tablet Take 1 tablet (15 mg total) by mouth at bedtime. 30 tablet 9  . Nutritional Supplements (FEEDING SUPPLEMENT,  VITAL 1.5 CAL,) LIQD D/C continuous feeding pump. Give Vital 1.5 or equivalent, 7 bottles daily, bolus feeding via PEG. Flush tube with 60 mL free water before and after bolus feeding QID. In addition, flush tube with 250 mL free water QID. Send formula and syringes. Pick up pump. 7 Can 4  . nystatin (MYCOSTATIN) 100000 UNIT/ML suspension Use as directed 5 mLs (500,000 Units total) in the mouth or throat 4 (four) times daily. 60 mL 0  . OLANZapine (ZYPREXA) 5 MG tablet Place 1 tablet (5 mg total) into feeding tube at bedtime. (Patient not taking: Reported on 08/10/2016) 30 tablet 9  . ondansetron (ZOFRAN ODT) 8 MG disintegrating tablet Take 1 tablet (8 mg total) by mouth every 8 (eight) hours as needed for nausea or vomiting. (Patient not taking: Reported on 08/10/2016) 30 tablet 5  . polyethylene glycol (MIRALAX / GLYCOLAX) packet Place 17 g into feeding tube daily. 14 each 0  . prochlorperazine (COMPAZINE) 10 MG tablet TAKE 1 TABLET BY MOUTH EVERY 6 HOURS AS NEEDED FOR NAUSEA AND VOMITING (Patient not taking: Reported on 08/10/2016) 30 tablet 1  . senna (SENOKOT) 8.6 MG tablet Take 1 tablet by mouth 2 (two) times daily.     No current facility-administered medications for this visit.    Facility-Administered Medications Ordered in Other Visits  Medication Dose Route Frequency Provider Last Rate Last Dose  . heparin lock flush 100 unit/mL  500 Units Intracatheter Once PRN Alvy Bimler, Saddie Sandeen, MD      . sodium chloride flush (NS) 0.9 % injection 10 mL  10 mL Intracatheter PRN Alvy Bimler, Richetta Cubillos, MD        PHYSICAL EXAMINATION: ECOG PERFORMANCE STATUS: 1 - Symptomatic but completely ambulatory  Vitals:   08/15/16 1313  BP: 120/68  Pulse: (!) 113  Resp: 18  Temp: 99.6 F (37.6 C)  SpO2: 99%   Filed Weights   08/15/16 1313  Weight: 180 lb 3.2 oz (81.7 kg)    GENERAL:alert, no distress and comfortable SKIN: skin color, texture, turgor are normal, no rashes or significant lesions EYES: normal,  Conjunctiva are pink and non-injected, sclera clear OROPHARYNX: She has no oral thrush. NECK: Noted hard and soft tissue mass beneath her chin LYMPH: Palpable right neck lymphadenopathy on exam LUNGS: clear to auscultation and percussion with normal breathing effort HEART: regular rate & rhythm and no murmurs and no lower extremity edema ABDOMEN:abdomen soft, non-tender and normal bowel sounds Musculoskeletal:no cyanosis of digits and no clubbing  NEURO: alert & oriented x 3 with fluent speech, no focal motor/sensory deficits  LABORATORY DATA:  I have reviewed the data as listed    Component Value Date/Time   NA 138 07/16/2016 1122   K 3.8 07/16/2016 1122   CL 101 06/12/2016 0540   CO2 28 07/16/2016 1122   GLUCOSE 113 07/16/2016 1122   BUN 30.1 (H) 07/16/2016 1122   CREATININE 1.3 (H) 07/16/2016 1122   CALCIUM 10.0 07/16/2016 1122   PROT 7.8 07/16/2016 1122   ALBUMIN 3.5 07/16/2016 1122  AST 26 07/16/2016 1122   ALT 14 07/16/2016 1122   ALKPHOS 89 07/16/2016 1122   BILITOT 0.59 07/16/2016 1122   GFRNONAA 56 (L) 06/12/2016 0540   GFRAA >60 06/12/2016 0540    No results found for: SPEP, UPEP  Lab Results  Component Value Date   WBC 5.4 07/16/2016   NEUTROABS 4.4 07/16/2016   HGB 10.3 (L) 07/16/2016   HCT 29.4 (L) 07/16/2016   MCV 90.5 07/16/2016   PLT 220 07/16/2016      Chemistry      Component Value Date/Time   NA 138 07/16/2016 1122   K 3.8 07/16/2016 1122   CL 101 06/12/2016 0540   CO2 28 07/16/2016 1122   BUN 30.1 (H) 07/16/2016 1122   CREATININE 1.3 (H) 07/16/2016 1122      Component Value Date/Time   CALCIUM 10.0 07/16/2016 1122   ALKPHOS 89 07/16/2016 1122   AST 26 07/16/2016 1122   ALT 14 07/16/2016 1122   BILITOT 0.59 07/16/2016 1122       RADIOGRAPHIC STUDIES: I have personally reviewed the radiological images as listed and agreed with the findings in the report. Ct Soft Tissue Neck W Contrast  Result Date: 08/09/2016 CLINICAL DATA:   Squamous cell carcinoma of the tongue. Completion of chemo radiation in May of 2018. EXAM: CT NECK WITH CONTRAST TECHNIQUE: Multidetector CT imaging of the neck was performed using the standard protocol following the bolus administration of intravenous contrast. CONTRAST:  18mL ISOVUE-300 IOPAMIDOL (ISOVUE-300) INJECTION 61% COMPARISON:  CT neck 03/02/2016 FINDINGS: Pharynx and larynx: --Nasopharynx: Fossae of Rosenmuller are clear. Normal adenoid tonsils for age. --Oral cavity and oropharynx: At the left base of tongue there is a mass lesion measuring 2.9 x 2.2 x 2.3 cm (AP x Transverse x CC). The degree of conspicuity is markedly increased compared to the more indistinct soft tissue in this region described on the prior study. There is persistent lower left facial and left neck soft tissue thickening extending from the ankle of the mandible to the level of the hyoid bone, which is unchanged. --Hypopharynx: The epiglottis is thickened, likely secondary to radiation treatment. There is no hypopharyngeal airway compromise. There is mild crowding of the left vallecula due to mass effect from the above-described process. --Larynx: There is thickening of the aryepiglottic folds without discrete lesion. The vocal cords appear normal. --Retropharyngeal space: No abscess, effusion or lymphadenopathy. Salivary glands: --Parotid: No mass lesion or inflammation. No sialolithiasis or ductal dilatation. --Submandibular: The left submandibular gland is surgically absent. There is intraglandular ductal dilatation, unchanged. --Sublingual: The left sublingual gland is not clearly identified. The expected location of the right sublingual gland is obscured or invaded by the below described right submental lesion. Thyroid: Cystic left thyroid nodule measures 12 mm. Lymph nodes: At the site of the previously identified enlarged right level 1 B lymph node, there is now a confluent, partially necrotic mass that measures approximately  4.0 x 3.0 cm. The mass extends into the right floor of mouth and displaces or invades the right mylohyoid and hyoglossus muscles. The right genioglossus muscle is deviated to the left. There is a centrally necrotic right level IIa lymph node measuring 8 millimeters. No other right-sided cervical lymphadenopathy. The patient is status post left neck dissection. There is no residual left cervical lymphadenopathy. Vascular: Major cervical vessels are patent. Limited intracranial: Normal. Visualized orbits: Normal. Mastoids and visualized paranasal sinuses: No fluid levels or advanced mucosal thickening. No mastoid effusion. Skeleton: No bony spinal canal  stenosis. No lytic or blastic lesions. Upper chest: The lungs are more fully characterized on the concomitant dedicated chest CT. Within the visible lung apices there are multiple ground-glass opacities. Other: None. IMPRESSION: 1. Progression of disease with new/markedly worsened mass of the left base of tongue measuring 2.9 x 2.2 x 2.3 centimeters. Persistent soft tissue thickening of the postoperative left neck extending from the angle of the mandible to the level of the hyoid bone. 2. Confluent, centrally necrotic mass in the right submental region, at the site of previously seen enlarged level 1B lymph node. This mass extends into the floor of the mouth and displaces or invades the right sublingual gland and the right mylohyoid and hyoglossus muscles. 3. Centrally necrotic right level IIa lymph node is new from the prior study and consistent with metastatic spread. No other lower cervical lymphadenopathy. 4. Multiple ground-glass opacities in the lung apices. Please see dedicated report for concomitant chest CT for more complete characterization. 5. Thickening of the epiglottis and aryepiglottic folds is likely secondary to prior radiation treatment. No acute airway compromise. Electronically Signed   By: Ulyses Jarred M.D.   On: 08/09/2016 16:43   Ct Chest W  Contrast  Result Date: 08/09/2016 CLINICAL DATA:  Squamous cell carcinoma the tongue diagnosis December 2017 with chemo radiation therapy complete. EXAM: CT CHEST WITH CONTRAST TECHNIQUE: Multidetector CT imaging of the chest was performed during intravenous contrast administration. CONTRAST:  26mL ISOVUE-300 IOPAMIDOL (ISOVUE-300) INJECTION 61% COMPARISON:  Chest CT 03/02/2016, neck CT 08/09/2016 FINDINGS: Cardiovascular: No significant vascular findings. Normal heart size. No pericardial effusion. Mediastinum/Nodes: Port in the RIGHT chest. No axillary or supraclavicular adenopathy. No mediastinal hilar adenopathy. No pericardial fluid. Small LEFT thyroid nodule measures 8 mm. Lungs/Pleura: Bilateral upper lobe ground-glass nodules which are new from prior. RIGHT upper lobe nodule measures 15 mm (image 36, series 11). LEFT upper lobe nodule measures 18 mm on image 36. Angular nodule in the RIGHT upper lobe measures 11 mm on image 58, series 11. Upper Abdomen: Peg tube in stomach.  Adrenal glands normal. Musculoskeletal: No aggressive osseous lesion IMPRESSION: 1. New bilateral upper lobe ground-glass nodules. Favor infectious or inflammatory process. Recommend close attention on routine follow-up. 2. Angular nodule RIGHT upper lobe is also favored infectious or inflammatory. Recommend attention on follow-up as above. 3. No mediastinal or supraclavicular lymphadenopathy. 4. Small RIGHT thyroid nodule does not meet biopsy criteria. Please see separate CT neck dictation. Electronically Signed   By: Suzy Bouchard M.D.   On: 08/09/2016 16:23    ASSESSMENT & PLAN:  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[facility]) Review imaging study with the patient and her husband The abnormal soft tissue thickening underneath her chin and the right side of her neck worrisome for cancer recurrence I recommend PET CT scan for further staging and clarification I will order the as soon as possible and see her back next week to  review test results  Cancer associated pain Her pain is improving She is only used Dilaudid on a rare basis We will continue to same for now with plan to taper her off pain medicine in the future  Dysarthria She has profound dysarthria I recommend close follow-up with speech therapist  Dysphagia She has persistent dysphagia and has not been doing her neck exercises as directed Overall, her dysphagia is improving and she is able to tolerate some soft diet I recommend she transition most of her diet by mouth with the aim to remove the feeding tube at  the end of the month if possible She will continue close follow-up with speech and language therapist   Orders Placed This Encounter  Procedures  . NM PET Image Initial (PI) Skull Base To Thigh    Standing Status:   Future    Standing Expiration Date:   08/15/2017    Order Specific Question:   If indicated for the ordered procedure, I authorize the administration of a radiopharmaceutical per Radiology protocol    Answer:   Yes    Order Specific Question:   Preferred imaging location?    Answer:   [facility]    Order Specific Question:   Radiology Contrast Protocol - do NOT remove file path    Answer:   [email or web address]   All questions were answered. The patient knows to call the clinic with any problems, questions or concerns. No barriers to learning was detected. I spent 25 minutes counseling the patient face to face. The total time spent in the appointment was 40 minutes and more than 50% was on counseling and review of test results     [name], MD 08/16/2016 3:56 PM

## 2016-08-16 NOTE — Assessment & Plan Note (Signed)
Her pain is improving She is only used Dilaudid on a rare basis We will continue to same for now with plan to taper her off pain medicine in the future

## 2016-08-16 NOTE — Assessment & Plan Note (Signed)
Review imaging study with the patient and her husband The abnormal soft tissue thickening underneath her chin and the right side of her neck worrisome for cancer recurrence I recommend PET CT scan for further staging and clarification I will order the as soon as possible and see her back next week to review test results

## 2016-08-16 NOTE — Assessment & Plan Note (Signed)
She has persistent dysphagia and has not been doing her neck exercises as directed Overall, her dysphagia is improving and she is able to tolerate some soft diet I recommend she transition most of her diet by mouth with the aim to remove the feeding tube at the end of the month if possible She will continue close follow-up with speech and language therapist

## 2016-08-16 NOTE — Progress Notes (Signed)
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 Pt will be reviewed at tumor board next week and referred back to ENT Specialist, Dr. Wilburn Cornelia. Her areas of suspected progression appear to fall within previous high dose radiation fields.   Estimated nutrition needs: 2300-2500 calories, 120-135 grams protein, 2.5 L fluid.  Nutrition diagnosis: Unintended weight loss and severe malnutrition in both continue.  Intervention: Patient has requested tube feeding change to bolus feeding.  States she no longer uses her feeding pump at home. Tube feeding order changed to bolus feeds using vital 1.5, 7 bottles daily via PEG with 60 mL free water before and after each bolus feeding 4 times a day. In addition, patient will flush feeding tube with 250 mL free water 4 times a day Tube feeding orders were written and [facility] was notified.  Monitoring, evaluation, goals: Patient will tolerate tube feeding to meet greater than 90% of estimated nutrition needs.  Next visit: Follow-up to be scheduled.  **Disclaimer: This note was dictated with voice recognition software. Similar sounding words can inadvertently be transcribed and this note may contain transcription errors which may not have been corrected upon publication of note.**

## 2016-08-16 NOTE — Assessment & Plan Note (Signed)
She has profound dysarthria I recommend close follow-up with speech therapist

## 2016-08-20 ENCOUNTER — Telehealth: Payer: Self-pay | Admitting: Hematology and Oncology

## 2016-08-20 ENCOUNTER — Ambulatory Visit: Payer: 59

## 2016-08-20 NOTE — Telephone Encounter (Signed)
sw pt to confirm 8/23 appt at 12 per sch msg

## 2016-08-22 ENCOUNTER — Ambulatory Visit: Payer: Self-pay | Admitting: Hematology and Oncology

## 2016-08-22 ENCOUNTER — Ambulatory Visit
Admission: RE | Admit: 2016-08-22 | Discharge: 2016-08-22 | Disposition: A | Discharge status: Home / Self Care | Payer: 59 | Source: Ambulatory Visit | Attending: Hematology and Oncology | Admitting: Hematology and Oncology

## 2016-08-22 DIAGNOSIS — C023 Malignant neoplasm of anterior two-thirds of tongue, part unspecified: Secondary | ICD-10-CM | POA: Diagnosis not present

## 2016-08-22 DIAGNOSIS — R918 Other nonspecific abnormal finding of lung field: Secondary | ICD-10-CM | POA: Insufficient documentation

## 2016-08-22 DIAGNOSIS — Z931 Gastrostomy status: Secondary | ICD-10-CM | POA: Diagnosis not present

## 2016-08-22 LAB — GLUCOSE, CAPILLARY: GLUCOSE-CAPILLARY: 90 mg/dL (ref 65–99)

## 2016-08-22 MED ORDER — FLUDEOXYGLUCOSE F - 18 (FDG) INJECTION
12.0000 | Freq: Once | INTRAVENOUS | Status: AC | PRN
  Administered 2016-08-22: 13.178 via INTRAVENOUS

## 2016-08-23 ENCOUNTER — Ambulatory Visit (HOSPITAL_BASED_OUTPATIENT_CLINIC_OR_DEPARTMENT_OTHER): Payer: 59 | Admitting: Hematology and Oncology

## 2016-08-23 ENCOUNTER — Ambulatory Visit (HOSPITAL_BASED_OUTPATIENT_CLINIC_OR_DEPARTMENT_OTHER): Payer: 59

## 2016-08-23 VITALS — BP 123/66 | HR 101 | Temp 98.8°F | Resp 18 | Ht 68.0 in | Wt 180.6 lb

## 2016-08-23 DIAGNOSIS — G893 Neoplasm related pain (acute) (chronic): Secondary | ICD-10-CM | POA: Diagnosis not present

## 2016-08-23 DIAGNOSIS — R5381 Other malaise: Secondary | ICD-10-CM

## 2016-08-23 DIAGNOSIS — C023 Malignant neoplasm of anterior two-thirds of tongue, part unspecified: Secondary | ICD-10-CM

## 2016-08-23 DIAGNOSIS — Z7189 Other specified counseling: Secondary | ICD-10-CM | POA: Diagnosis not present

## 2016-08-23 LAB — COMPREHENSIVE METABOLIC PANEL
ALT: 10 U/L (ref 0–55)
ANION GAP: 7 meq/L (ref 3–11)
AST: 19 U/L (ref 5–34)
Albumin: 3.4 g/dL — ABNORMAL LOW (ref 3.5–5.0)
Alkaline Phosphatase: 96 U/L (ref 40–150)
BUN: 25.9 mg/dL (ref 7.0–26.0)
CALCIUM: 10.2 mg/dL (ref 8.4–10.4)
CHLORIDE: 99 meq/L (ref 98–109)
CO2: 33 mEq/L — ABNORMAL HIGH (ref 22–29)
Creatinine: 1.4 mg/dL — ABNORMAL HIGH (ref 0.6–1.1)
EGFR: 48 mL/min/{1.73_m2} — ABNORMAL LOW (ref >90)
Glucose: 103 mg/dl (ref 70–140)
POTASSIUM: 4.1 meq/L (ref 3.5–5.1)
Sodium: 139 mEq/L (ref 136–145)
Total Bilirubin: 0.44 mg/dL (ref 0.20–1.20)
Total Protein: 8.1 g/dL (ref 6.4–8.3)

## 2016-08-23 LAB — CBC WITH DIFFERENTIAL/PLATELET
BASO%: 0.6 % (ref 0.0–2.0)
BASOS ABS: 0 10*3/uL (ref 0.0–0.1)
EOS ABS: 0.1 10*3/uL (ref 0.0–0.5)
EOS%: 1.2 % (ref 0.0–7.0)
HEMATOCRIT: 32 % — AB (ref 34.8–46.6)
HGB: 11.1 g/dL — ABNORMAL LOW (ref 11.6–15.9)
LYMPH#: 0.6 10*3/uL — AB (ref 0.9–3.3)
LYMPH%: 9.3 % — ABNORMAL LOW (ref 14.0–49.7)
MCH: 30.8 pg (ref 25.1–34.0)
MCHC: 34.7 g/dL (ref 31.5–36.0)
MCV: 88.9 fL (ref 79.5–101.0)
MONO#: 0.7 10*3/uL (ref 0.1–0.9)
MONO%: 10.5 % (ref 0.0–14.0)
NEUT#: 5.1 10*3/uL (ref 1.5–6.5)
NEUT%: 78.4 % — AB (ref 38.4–76.8)
PLATELETS: 232 10*3/uL (ref 145–400)
RBC: 3.6 10*6/uL — ABNORMAL LOW (ref 3.70–5.45)
RDW: 13.5 % (ref 11.2–14.5)
WBC: 6.5 10*3/uL (ref 3.9–10.3)

## 2016-08-23 LAB — MAGNESIUM: Magnesium: 2.3 mg/dl (ref 1.5–2.5)

## 2016-08-23 MED ORDER — ONDANSETRON HCL 8 MG PO TABS: 8.0000 mg | ORAL_TABLET | Freq: Two times a day (BID) | ORAL | 1 refills | Status: DC | PRN

## 2016-08-23 MED ORDER — PROCHLORPERAZINE MALEATE 10 MG PO TABS: 10.0000 mg | ORAL_TABLET | Freq: Four times a day (QID) | ORAL | 1 refills | Status: DC | PRN

## 2016-08-23 MED FILL — PROCHLORPERAZINE 10 MG TAB: 10 | 7 days supply | Qty: 30 | Fill #0

## 2016-08-23 MED FILL — ONDANSETRON HCL 8 MG TAB: 8 | 15 days supply | Qty: 30 | Fill #0

## 2016-08-23 NOTE — Progress Notes (Signed)
DISCONTINUE ON PATHWAY REGIMEN - Head and Neck     Administer weekly:     Cisplatin        Dose Mod: None  **Always confirm dose/schedule in your pharmacy ordering system**    REASON: Disease Progression PRIOR TREATMENT: [id number]: Cisplatin 40 mg/m2 Weekly with Concurrent Radiation for 6 - 7 Weeks TREATMENT RESPONSE: Partial Response (PR)  START ON PATHWAY REGIMEN - Head and Neck   Cetuximab 400/250 mg/m2 D1,8,15 + Carboplatin AUC 5 D1 + 5-Fluorouracil 1000 mg/m2 CIV D1,2,3,4 q21 Days:   A cycle is every 21 days:     Cetuximab      Cetuximab      Cetuximab      Carboplatin      5-Fluorouracil   **Always confirm dose/schedule in your pharmacy ordering system**    Cetuximab Maintenance (4 weeks per order sheet):   Administer weekly:     Cetuximab   **Always confirm dose/schedule in your pharmacy ordering system**    Patient Characteristics: Oral Cavity, Local Recurrence, Not a Candidate for Radiation Therapy Disease Classification: Oral Cavity AJCC T Category: T2 Current Disease Status: Local Recurrence AJCC M Category: M0 AJCC N Category: pN2 AJCC 8 Stage Grouping: IVA Is patient a candidate for radiation therapy? No Intent of Therapy: Non-Curative / Palliative Intent, Discussed with Patient

## 2016-08-24 ENCOUNTER — Encounter: Payer: Self-pay | Admitting: Hematology and Oncology

## 2016-08-24 ENCOUNTER — Telehealth: Payer: Self-pay | Admitting: *Deleted

## 2016-08-24 DIAGNOSIS — R5381 Other malaise: Secondary | ICD-10-CM | POA: Insufficient documentation

## 2016-08-24 NOTE — Progress Notes (Signed)
[facility] is prepared to provide [facility] services for IV hydration therapy for Tiffany Yu starting on 09-01-16.  I have confirmed receipt and processing of referral/acceptance with Tiffany Plunk, RN at [facility].   If patient discharges after hours, please call [phone].   Tiffany Yu 08/24/2016, 11:40 AM

## 2016-08-24 NOTE — Addendum Note (Signed)
Addended byAlvy Bimler, Antion Andres on: 08/24/2016 08:35 AM   Modules accepted: Orders

## 2016-08-24 NOTE — Assessment & Plan Note (Signed)
We have extensive discussion about goals of care. 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 We discussed importance of Advanced Directives and Living will.

## 2016-08-24 NOTE — Telephone Encounter (Signed)
Referral called to [facility] at [facility] for home infusions

## 2016-08-24 NOTE — Progress Notes (Addendum)
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 Tongue, biopsy, Anterior deep margin left SQUAMOUS CELL CARCINOMA 3. Tongue, biopsy, Posterior deep margin left NEGATIVE FOR CARCINOMA 4. Tongue, biopsy, Posterior margin left SQUAMOUS CELL CARCINOMA 5. Tongue, excisional biopsy, New anterior deep margin NEGATIVE FOR CARCINOMA 6. Tongue, excisional biopsy, New posterior deep margin SMALL FOCUS OF SQUAMOUS CELL CARCINOMA (0.1 CM, ONLY PRESENT AT THE DEEPER PERMINENT SECTION) 7. Tongue, resection for tumor, Left lateral INFILTRATIVE KERATINIZED SQUAMOUS CELL CARCINOMA (3.2 CM) THE TUMOR INVADES UNDERNEATH MUSCLE OF THE TONGUE (2.0 CM, PT2) LYMPHOVASCULAR AND PERINEURAL INVASION IDENTIFIED SQUAMOUS CELL CARCINOMA PRESENTED AT (FINAL MARGINS REFER TO PART 2-6) LATERAL INKED MARGIN (1.0 CM) MEDIAL INKED MARGIN (0.6 CM)      02/08/2016 Surgery    SURGICAL PROCEDURES: 1. Left hemiglossectomy with primary closure. 2. Left selective neck dissection (zones I-III).      03/02/2016 Imaging    CT chest with contrast: No evidence of metastatic disease in the chest. 2. Aortic atherosclerosis.       03/02/2016 Imaging    CT neck with contrast showed : Newly enlarged and round 10 mm  right level 1b lymph node (series 5, image 45). The level 1A nodes remain normal. This right 1 B node is nonspecific but suspicious for progressive nodal metastatic disease. It might be amenable to Ultrasound-guided FNA. 2. Interval left hemiglossectomy and selective left neck dissection with confluent indeterminate soft tissue from the anterior left submandibular space through the left carotid space and obscuring the left level 1B, level 2, and level IIIa nodal stations. This study will serve as a new postoperative baseline of this area. Attention directed on close interval follow-up. 3. Mild soft tissue thickening also along the left lateral tongue resection margin. Attention directed on followup. 4. Chest CT today reported separately      03/14/2016 Procedure    She has normal baseline hearing test      03/15/2016 Procedure    1. Successful placement of a right internal jugular approach power injectable Port-A-Cath. The Port a catheter is ready for immediate use. 2. Successful fluoroscopic insertion of a 20-French pull-through gastrostomy tube. The gastrostomy may be used immediately for medication administration and may be utilized in 24 hrs for the initiation of feeds.      03/15/2016 - 04/30/2016 Radiation Therapy    Tongue and Bilateral Neck treated to 68 Gy in 33 fractions      03/16/2016 - 04/27/2016 Chemotherapy    She received weekly cisplatin. Cycle 2-6 reduced dose at 20 mg/m2 due to significant side-effects and poor tolerance       04/04/2016 Adverse Reaction    Chemotherapy is placed on hold due to acute renal failure      04/13/2016 Adverse Reaction    Chemo was placed on hold due to shingles outbreak and paronychia  05/29/2016 - 06/04/2016 Hospital Admission    She was admitted to the hospital for management of nausea, vomiting and inability to tolerate tube feeding      06/06/2016 - 06/13/2016 Hospital Admission    She was admitted to the hospital for management of nausea,  vomiting and inability to tolerate tube feeding      08/09/2016 Imaging    1. New bilateral upper lobe ground-glass nodules. Favor infectious or inflammatory process. Recommend close attention on routine follow-up. 2. Angular nodule RIGHT upper lobe is also favored infectious or inflammatory. Recommend attention on follow-up as above. 3. No mediastinal or supraclavicular lymphadenopathy. 4. Small RIGHT thyroid nodule does not meet biopsy criteria. Please see separate CT neck dictation.      08/09/2016 Imaging    1. Progression of disease with new/markedly worsened mass of the left base of tongue measuring 2.9 x 2.2 x 2.3 centimeters. Persistent soft tissue thickening of the postoperative left neck extending from the angle of the mandible to the level of the hyoid bone. 2. Confluent, centrally necrotic mass in the right submental region, at the site of previously seen enlarged level 1B lymph node. This mass extends into the floor of the mouth and displaces or invades the right sublingual gland and the right mylohyoid and hyoglossus muscles. 3. Centrally necrotic right level IIa lymph node is new from the prior study and consistent with metastatic spread. No other lower cervical lymphadenopathy. 4. Multiple ground-glass opacities in the lung apices. Please see dedicated report for concomitant chest CT for more complete characterization. 5. Thickening of the epiglottis and aryepiglottic folds is likely secondary to prior radiation treatment. No acute airway compromise.      08/22/2016 PET scan    1. The recently described progressive cervical disease is markedly hypermetabolic. This includes local recurrence in the left base of tongue and a confluent right submental nodal mass. In addition, there are small hypermetabolic lymph nodes on the right in station IIb and the anterior to the lower trachea.  2. No typical metastatic disease within the chest, abdomen or pelvis. 3. The recently described  ground-glass opacities in both upper lobes have enlarged, become more dense and are associated with moderate hypermetabolic activity. These are new from February and probably inflammatory/infectious. CT follow-up after appropriate therapy recommended.       INTERVAL HISTORY: Please see below for problem oriented charting. She returns with multiple family members She denies pain No recent cough, chest pain or shortness of breath She has not been eating much by mouth and is dependent on feeding tube She denies depression  REVIEW OF SYSTEMS:   Constitutional: Denies fevers, chills or abnormal weight loss Eyes: Denies blurriness of vision Ears, nose, mouth, throat, and face: Denies mucositis or sore throat Respiratory: Denies cough, dyspnea or wheezes Cardiovascular: Denies palpitation, chest discomfort or lower extremity swelling Gastrointestinal:  Denies nausea, heartburn or change in bowel habits Skin: Denies abnormal skin rashes Lymphatics: Denies new lymphadenopathy or easy bruising Neurological:Denies numbness, tingling or new weaknesses Behavioral/Psych: Mood is stable, no new changes  All other systems were reviewed with the patient and are negative.  I have reviewed the past medical history, past surgical history, social history and family history with the patient and they are unchanged from previous note.  ALLERGIES:  has No Known Allergies.  MEDICATIONS:  Current Outpatient Prescriptions  Medication Sig Dispense Refill  . Amino Acids-Protein Hydrolys (FEEDING SUPPLEMENT, PRO-STAT SUGAR FREE 64,) LIQD Place 30 mLs into feeding tube daily. [place]  mL 0  . chlorhexidine (PERIDEX) 0.12 % solution Rinse with 15 mls three times daily for 30 seconds. Use after breakfast, dinner, and at bedtime. Spit out excess. Do not swallow. 480 mL prn  . HYDROmorphone HCl (DILAUDID) 1 MG/ML LIQD Place 4 mLs (4 mg total) into feeding tube every 3 (three) hours as needed for moderate pain or severe  pain. 473 mL 0  . levothyroxine (SYNTHROID, LEVOTHROID) 75 MCG tablet Take 75 mcg by mouth daily before breakfast.     . lidocaine-prilocaine (EMLA) cream Apply to affected area once 30 g 3  . LORazepam (ATIVAN) 1 MG tablet TAKE 1 TABLET BY MOUTH EVERY 8 HOURS AS NEEDED FOR ANXIETY OR SLEEP 60 tablet 0  . mirtazapine (REMERON SOL-TAB) 15 MG disintegrating tablet Take 1 tablet (15 mg total) by mouth at bedtime. 30 tablet 9  . Nutritional Supplements (FEEDING SUPPLEMENT, VITAL 1.5 CAL,) LIQD D/C continuous feeding pump. Give Vital 1.5 or equivalent, 7 bottles daily, bolus feeding via PEG. Flush tube with 60 mL free water before and after bolus feeding QID. In addition, flush tube with 250 mL free water QID. Send formula and syringes. Pick up pump. 7 Can 4  . nystatin (MYCOSTATIN) 100000 UNIT/ML suspension Use as directed 5 mLs (500,000 Units total) in the mouth or throat 4 (four) times daily. 60 mL 0  . OLANZapine (ZYPREXA) 5 MG tablet Place 1 tablet (5 mg total) into feeding tube at bedtime. (Patient not taking: Reported on 08/10/2016) 30 tablet 9  . ondansetron (ZOFRAN ODT) 8 MG disintegrating tablet Take 1 tablet (8 mg total) by mouth every 8 (eight) hours as needed for nausea or vomiting. (Patient not taking: Reported on 08/10/2016) 30 tablet 5  . ondansetron (ZOFRAN) 8 MG tablet Take 1 tablet (8 mg total) by mouth 2 (two) times daily as needed for refractory nausea / vomiting. Start on day 3 after chemo. 30 tablet 1  . polyethylene glycol (MIRALAX / GLYCOLAX) packet Place 17 g into feeding tube daily. 14 each 0  . prochlorperazine (COMPAZINE) 10 MG tablet TAKE 1 TABLET BY MOUTH EVERY 6 HOURS AS NEEDED FOR NAUSEA AND VOMITING (Patient not taking: Reported on 08/10/2016) 30 tablet 1  . prochlorperazine (COMPAZINE) 10 MG tablet Take 1 tablet (10 mg total) by mouth every 6 (six) hours as needed (Nausea or vomiting). 30 tablet 1  . senna (SENOKOT) 8.6 MG tablet Take 1 tablet by mouth 2 (two) times daily.      No current facility-administered medications for this visit.    Facility-Administered Medications Ordered in Other Visits  Medication Dose Route Frequency Provider Last Rate Last Dose  . heparin lock flush 100 unit/mL  500 Units Intracatheter Once PRN Alvy Bimler, [place] Athanas, MD      . sodium chloride flush (NS) 0.9 % injection 10 mL  10 mL Intracatheter PRN Alvy Bimler, Delayla Hoffmaster, MD        PHYSICAL EXAMINATION: ECOG PERFORMANCE STATUS: 1 - Symptomatic but completely ambulatory  Vitals:   08/23/16 1213  BP: 123/66  Pulse: (!) 101  Resp: 18  Temp: 98.8 F (37.1 C)  SpO2: 97%   Filed Weights   08/23/16 1213  Weight: 180 lb 9.6 oz (81.9 kg)    GENERAL:alert, no distress and comfortable SKIN: skin color, texture, turgor are normal, no rashes or significant lesions EYES: normal, Conjunctiva are pink and non-injected, sclera clear OROPHARYNX:no exudate, no erythema and lips, buccal mucosa, and tongue normal  NECK: She has palpable mass in the submental region  consistent with disease  LYMPH:  no palpable lymphadenopathy in the cervical, axillary or inguinal LUNGS: clear to auscultation and percussion with normal breathing effort HEART: regular rate & rhythm and no murmurs and no lower extremity edema ABDOMEN:abdomen soft, non-tender and normal bowel sounds Musculoskeletal:no cyanosis of digits and no clubbing  NEURO: alert & oriented x 3 with fluent speech, no focal motor/sensory deficits  LABORATORY DATA:  I have reviewed the data as listed    Component Value Date/Time   NA 139 08/23/2016 1337   K 4.1 08/23/2016 1337   CL 101 06/12/2016 0540   CO2 33 (H) 08/23/2016 1337   GLUCOSE 103 08/23/2016 1337   BUN 25.9 08/23/2016 1337   CREATININE 1.4 (H) 08/23/2016 1337   CALCIUM 10.2 08/23/2016 1337   PROT 8.1 08/23/2016 1337   ALBUMIN 3.4 (L) 08/23/2016 1337   AST 19 08/23/2016 1337   ALT 10 08/23/2016 1337   ALKPHOS 96 08/23/2016 1337   BILITOT 0.44 08/23/2016 1337   GFRNONAA 56 (L)  06/12/2016 0540   GFRAA >60 06/12/2016 0540    No results found for: SPEP, UPEP  Lab Results  Component Value Date   WBC 6.5 08/23/2016   NEUTROABS 5.1 08/23/2016   HGB 11.1 (L) 08/23/2016   HCT 32.0 (L) 08/23/2016   MCV 88.9 08/23/2016   PLT 232 08/23/2016      Chemistry      Component Value Date/Time   NA 139 08/23/2016 1337   K 4.1 08/23/2016 1337   CL 101 06/12/2016 0540   CO2 33 (H) 08/23/2016 1337   BUN 25.9 08/23/2016 1337   CREATININE 1.4 (H) 08/23/2016 1337      Component Value Date/Time   CALCIUM 10.2 08/23/2016 1337   ALKPHOS 96 08/23/2016 1337   AST 19 08/23/2016 1337   ALT 10 08/23/2016 1337   BILITOT 0.44 08/23/2016 1337       RADIOGRAPHIC STUDIES: I have reviewed imaging study with the patient and family members I have personally reviewed the radiological images as listed and agreed with the findings in the report. Ct Soft Tissue Neck W Contrast  Result Date: 08/09/2016 CLINICAL DATA:  Squamous cell carcinoma of the tongue. Completion of chemo radiation in May of 2018. EXAM: CT NECK WITH CONTRAST TECHNIQUE: Multidetector CT imaging of the neck was performed using the standard protocol following the bolus administration of intravenous contrast. CONTRAST:  73mL ISOVUE-300 IOPAMIDOL (ISOVUE-300) INJECTION 61% COMPARISON:  CT neck 03/02/2016 FINDINGS: Pharynx and larynx: --Nasopharynx: Fossae of Rosenmuller are clear. Normal adenoid tonsils for age. --Oral cavity and oropharynx: At the left base of tongue there is a mass lesion measuring 2.9 x 2.2 x 2.3 cm (AP x Transverse x CC). The degree of conspicuity is markedly increased compared to the more indistinct soft tissue in this region described on the prior study. There is persistent lower left facial and left neck soft tissue thickening extending from the ankle of the mandible to the level of the hyoid bone, which is unchanged. --Hypopharynx: The epiglottis is thickened, likely secondary to radiation treatment.  There is no hypopharyngeal airway compromise. There is mild crowding of the left vallecula due to mass effect from the above-described process. --Larynx: There is thickening of the aryepiglottic folds without discrete lesion. The vocal cords appear normal. --Retropharyngeal space: No abscess, effusion or lymphadenopathy. Salivary glands: --Parotid: No mass lesion or inflammation. No sialolithiasis or ductal dilatation. --Submandibular: The left submandibular gland is surgically absent. There is intraglandular ductal dilatation, unchanged. --Sublingual:  The left sublingual gland is not clearly identified. The expected location of the right sublingual gland is obscured or invaded by the below described right submental lesion. Thyroid: Cystic left thyroid nodule measures 12 mm. Lymph nodes: At the site of the previously identified enlarged right level 1 B lymph node, there is now a confluent, partially necrotic mass that measures approximately 4.0 x 3.0 cm. The mass extends into the right floor of mouth and displaces or invades the right mylohyoid and hyoglossus muscles. The right genioglossus muscle is deviated to the left. There is a centrally necrotic right level IIa lymph node measuring 8 millimeters. No other right-sided cervical lymphadenopathy. The patient is status post left neck dissection. There is no residual left cervical lymphadenopathy. Vascular: Major cervical vessels are patent. Limited intracranial: Normal. Visualized orbits: Normal. Mastoids and visualized paranasal sinuses: No fluid levels or advanced mucosal thickening. No mastoid effusion. Skeleton: No bony spinal canal stenosis. No lytic or blastic lesions. Upper chest: The lungs are more fully characterized on the concomitant dedicated chest CT. Within the visible lung apices there are multiple ground-glass opacities. Other: None. IMPRESSION: 1. Progression of disease with new/markedly worsened mass of the left base of tongue measuring 2.9 x 2.2  x 2.3 centimeters. Persistent soft tissue thickening of the postoperative left neck extending from the angle of the mandible to the level of the hyoid bone. 2. Confluent, centrally necrotic mass in the right submental region, at the site of previously seen enlarged level 1B lymph node. This mass extends into the floor of the mouth and displaces or invades the right sublingual gland and the right mylohyoid and hyoglossus muscles. 3. Centrally necrotic right level IIa lymph node is new from the prior study and consistent with metastatic spread. No other lower cervical lymphadenopathy. 4. Multiple ground-glass opacities in the lung apices. Please see dedicated report for concomitant chest CT for more complete characterization. 5. Thickening of the epiglottis and aryepiglottic folds is likely secondary to prior radiation treatment. No acute airway compromise. Electronically Signed   By: Ulyses Jarred M.D.   On: 08/09/2016 16:43   Ct Chest W Contrast  Result Date: 08/09/2016 CLINICAL DATA:  Squamous cell carcinoma the tongue diagnosis December 2017 with chemo radiation therapy complete. EXAM: CT CHEST WITH CONTRAST TECHNIQUE: Multidetector CT imaging of the chest was performed during intravenous contrast administration. CONTRAST:  72mL ISOVUE-300 IOPAMIDOL (ISOVUE-300) INJECTION 61% COMPARISON:  Chest CT 03/02/2016, neck CT 08/09/2016 FINDINGS: Cardiovascular: No significant vascular findings. Normal heart size. No pericardial effusion. Mediastinum/Nodes: Port in the RIGHT chest. No axillary or supraclavicular adenopathy. No mediastinal hilar adenopathy. No pericardial fluid. Small LEFT thyroid nodule measures 8 mm. Lungs/Pleura: Bilateral upper lobe ground-glass nodules which are new from prior. RIGHT upper lobe nodule measures 15 mm (image 36, series 11). LEFT upper lobe nodule measures 18 mm on image 36. Angular nodule in the RIGHT upper lobe measures 11 mm on image 58, series 11. Upper Abdomen: Peg tube in  stomach.  Adrenal glands normal. Musculoskeletal: No aggressive osseous lesion IMPRESSION: 1. New bilateral upper lobe ground-glass nodules. Favor infectious or inflammatory process. Recommend close attention on routine follow-up. 2. Angular nodule RIGHT upper lobe is also favored infectious or inflammatory. Recommend attention on follow-up as above. 3. No mediastinal or supraclavicular lymphadenopathy. 4. Small RIGHT thyroid nodule does not meet biopsy criteria. Please see separate CT neck dictation. Electronically Signed   By: Suzy Bouchard M.D.   On: 08/09/2016 16:23   Nm Pet Image Initial (pi) Skull  Base To Thigh  Result Date: 08/22/2016 CLINICAL DATA:  Subsequent treatment strategy for squamous cell carcinoma of the left aspect of the tongue. Surgery 6 months ago with subsequent chemotherapy and radiation therapy. EXAM: NUCLEAR MEDICINE PET SKULL BASE TO THIGH TECHNIQUE: 13.178 mCi F-18 FDG was injected intravenously. Full-ring PET imaging was performed from the skull base to thigh after the radiotracer. CT data was obtained and used for attenuation correction and anatomic localization. FASTING BLOOD GLUCOSE:  Value: 90 mg/dl COMPARISON:  CT of the neck and chest 08/09/2016. FINDINGS: NECK Extensive tumor demonstrated on the recent neck CT is markedly hypermetabolic. The individual masses are better defined on the previous CT performed with contrast, although have not grossly changed. The infiltrating right submental lesion measures approximately 5.7 x 3.1 cm and demonstrates homogeneous hypermetabolic activity with an SUV max of 13.3. The tumor at the left tongue base measures approximately 3.7 x 2.9 cm on image 35 and has an SUV max of 12.0.9 mm level IIa node on the right (image 39) is also hypermetabolic with an SUV max of 5.6. There is also focal hypermetabolic activity anterior to the trachea along the inferior aspect of the right thyroid cartilage. This corresponds with a small soft tissue nodule  and has an SUV max of 7.2. Activity along the arytenoid cartilage is symmetric and likely physiologic. No abnormal activity within the left thyroid nodule. CHEST There are no hypermetabolic mediastinal, hilar or axillary lymph nodes. There are enlarging and increasingly dense focal ground-glass opacities in both upper lobes. These are moderately hypermetabolic. 2.4 cm right upper lobe component on image 56 has an SUV max of 4.7. 2.0 cm left upper lobe component on image 55 has an SUV max of 5.3. There are other smaller components in both upper lobes. No definite lower lobe involvement or solid pulmonary nodule. ABDOMEN/PELVIS There is no hypermetabolic activity within the liver, adrenal glands, spleen or pancreas. There is no hypermetabolic nodal activity. Percutaneous G-tube noted. SKELETON There is no hypermetabolic activity to suggest osseous metastatic disease. IMPRESSION: 1. The recently described progressive cervical disease is markedly hypermetabolic. This includes local recurrence in the left base of tongue and a confluent right submental nodal mass. In addition, there are small hypermetabolic lymph nodes on the right in station IIb and the anterior to the lower trachea. 2. No typical metastatic disease within the chest, abdomen or pelvis. 3. The recently described ground-glass opacities in both upper lobes have enlarged, become more dense and are associated with moderate hypermetabolic activity. These are new from February and probably inflammatory/infectious. CT follow-up after appropriate therapy recommended. Electronically Signed   By: Richardean Sale M.D.   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 We discussed various options. The decision was made based on publication at the [facility]. It is a category 1 recommendation from NCCN. We discussed the role of treatment is of palliative intent [facility] [facility] Med 2008; [phone]  The chemotherapy consists of   1. Carboplatin (at an area under the curve of 5 mg per milliliter per minute, as a 1-hour intravenous infusion on day 1) plus  2. Fluorouracil (at a dose of 1000 mg per square meter per day for 4 days) every 3 weeks for a maximum of 6 cycles  3. Cetuximab (at a dose of 400 mg per square meter initially, as a 2-hour intravenous infusion, then 250 mg per square meter, as a 1-hour intravenous infusion per week) for a maximum of 6 cycles.   Patients with stable disease who received chemotherapy plus cetuximab continued to receive cetuximab until disease progression or unacceptable toxic effects, whichever occurred first.  Adding cetuximab to platinum-based chemotherapy with fluorouracil (platinum-fluorouracil) significantly prolonged the median overall survival from 7.4 months in the chemotherapy-alone group to 10.1 months in the group that received chemotherapy plus cetuximab (hazard ratio for death, 0.80; 95% confidence interval, 0.64 to 0.99; P=0.04).   The addition of cetuximab prolonged the median progression-free survival time from 3.3 to 5.6 months (hazard ratio for progression, 0.54; P<0.001) and increased the response rate from 20% to 36% (P<0.001).   There were no cetuximab-related deaths.  We discussed some of the risks, benefits, side-effects of carboplatin, 5FU and Cetuximab  Some of the short term side-effects included, though not limited to, including weight loss, life threatening infections, risk of allergic reactions, need for transfusions of blood products, nausea, vomiting, mouth sores, change in bowel  habits, loss of hair, admission to hospital for various reasons, and risks of death.   Long term side-effects are also discussed including risks of infertility, permanent damage to nerve function, hearing loss, chronic fatigue, kidney damage with possibility needing hemodialysis, and rare secondary malignancy including bone marrow disorders.  The patient is aware that the response rates discussed earlier is not guaranteed.  After a long discussion, patient made an informed decision to proceed with the prescribed plan of care.   Patient education material was dispensed. Overall, given the patient is Jehovah witness and is not able to accept blood transfusion and given poor tolerability of prior treatment, I will reduce a dose of carboplatin and 5-FU for cycle 1 with the goal of adjusting the dose of treatment if needed depending on her blood counts in the future With upcoming holidays, we will skip over the week of Labor Day and I plan to see her back on the second week of September   Goals of care, counseling/discussion We have extensive discussion about goals of care. 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 We discussed importance of Advanced Directives and Living will.  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 Ambulatory referral to Home Health    Referral Priority:   Routine    Referral Type:   Home Health Care    Referral Reason:    Specialty Services Required    Requested Specialty:   [place]    Number of Visits Requested:   1   All questions were answered. The patient knows to call the clinic with any problems, questions or concerns. No barriers to learning  was detected. I spent 60 minutes counseling the patient face to face. The total time spent in the appointment was 80 minutes and more than 50% was on counseling and review of test results     [name], MD 08/24/2016 8:34 AM

## 2016-08-24 NOTE — Assessment & Plan Note (Signed)
The patient had significant problems with side effects to treatment in the past, including uncontrolled nausea, vomiting and dehydration Due to potential immunocompromise state, I would  recommend home health agency to schedule IV fluids if needed and to consider pump disconnect at home I explained to the patient why this is necessary and I agree with the plan of care

## 2016-08-24 NOTE — Assessment & Plan Note (Signed)
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 We discussed various options. The decision was made based on publication at the [facility]. It is a category 1 recommendation from NCCN. We discussed the role of treatment is of palliative intent [place] Med 2008; [phone]  The chemotherapy consists of   1. Carboplatin (at an area under the curve of 5 mg per milliliter per minute, as a 1-hour intravenous infusion on day 1) plus  2. Fluorouracil (at a dose of 1000 mg per square meter per day for 4 days) every 3 weeks for a maximum of 6 cycles  3. Cetuximab (at a dose of 400 mg per square meter initially, as a 2-hour intravenous infusion, then 250 mg per square meter, as a 1-hour intravenous infusion per week) for a maximum of 6 cycles.   Patients with stable disease who received chemotherapy plus cetuximab continued to receive cetuximab until disease progression or unacceptable toxic effects, whichever occurred first.  Adding cetuximab to platinum-based chemotherapy with fluorouracil (platinum-fluorouracil) significantly prolonged the median overall survival from 7.4 months in the chemotherapy-alone group to 10.1 months in the group that received chemotherapy plus cetuximab (hazard ratio for death, 0.80; 95% confidence interval, 0.64 to 0.99; P=0.04).   The addition of cetuximab prolonged the median progression-free survival time from 3.3 to 5.6 months (hazard ratio for progression, 0.54; P<0.001) and increased the response rate from 20% to 36% (P<0.001).   There were no cetuximab-related deaths.  We discussed some of the risks, benefits, side-effects of carboplatin, 5FU and  Cetuximab  Some of the short term side-effects included, though not limited to, including weight loss, life threatening infections, risk of allergic reactions, need for transfusions of blood products, nausea, vomiting, mouth sores, change in bowel habits, loss of hair, admission to hospital for various reasons, and risks of death.   Long term side-effects are also discussed including risks of infertility, permanent damage to nerve function, hearing loss, chronic fatigue, kidney damage with possibility needing hemodialysis, and rare secondary malignancy including bone marrow disorders.  The patient is aware that the response rates discussed earlier is not guaranteed.  After a long discussion, patient made an informed decision to proceed with the prescribed plan of care.   Patient education material was dispensed. Overall, given the patient is Jehov[employer] witness and is not able to accept blood transfusion and given poor tolerability of prior treatment, I will reduce a dose of carboplatin and 5-FU for cycle 1 with the goal of adjusting the dose of treatment if needed depending on her blood counts in the future With upcoming holidays, we will skip over the week of Labor Day and I plan to see her back on the second week of September

## 2016-08-24 NOTE — Assessment & Plan Note (Signed)
Her pain is stable She is only used Dilaudid on a rare basis We will continue to same for now

## 2016-08-27 ENCOUNTER — Encounter: Payer: Self-pay | Admitting: *Deleted

## 2016-08-27 ENCOUNTER — Ambulatory Visit (HOSPITAL_BASED_OUTPATIENT_CLINIC_OR_DEPARTMENT_OTHER): Payer: 59

## 2016-08-27 ENCOUNTER — Other Ambulatory Visit (HOSPITAL_BASED_OUTPATIENT_CLINIC_OR_DEPARTMENT_OTHER): Payer: 59

## 2016-08-27 ENCOUNTER — Other Ambulatory Visit: Payer: Self-pay | Admitting: Hematology and Oncology

## 2016-08-27 VITALS — BP 108/60 | HR 95 | Temp 99.2°F | Resp 18

## 2016-08-27 DIAGNOSIS — C023 Malignant neoplasm of anterior two-thirds of tongue, part unspecified: Secondary | ICD-10-CM

## 2016-08-27 DIAGNOSIS — Z5112 Encounter for antineoplastic immunotherapy: Secondary | ICD-10-CM | POA: Diagnosis not present

## 2016-08-27 DIAGNOSIS — Z5111 Encounter for antineoplastic chemotherapy: Secondary | ICD-10-CM | POA: Diagnosis not present

## 2016-08-27 LAB — COMPREHENSIVE METABOLIC PANEL
ALBUMIN: 3.4 g/dL — AB (ref 3.5–5.0)
ALT: 7 U/L (ref 0–55)
AST: 17 U/L (ref 5–34)
Alkaline Phosphatase: 93 U/L (ref 40–150)
Anion Gap: 8 mEq/L (ref 3–11)
BUN: 23.5 mg/dL (ref 7.0–26.0)
CHLORIDE: 100 meq/L (ref 98–109)
CO2: 31 mEq/L — ABNORMAL HIGH (ref 22–29)
Calcium: 10.1 mg/dL (ref 8.4–10.4)
Creatinine: 1.4 mg/dL — ABNORMAL HIGH (ref 0.6–1.1)
EGFR: 46 mL/min/{1.73_m2} — ABNORMAL LOW (ref >90)
GLUCOSE: 114 mg/dL (ref 70–140)
POTASSIUM: 3.9 meq/L (ref 3.5–5.1)
SODIUM: 138 meq/L (ref 136–145)
Total Bilirubin: 0.45 mg/dL (ref 0.20–1.20)
Total Protein: 8.1 g/dL (ref 6.4–8.3)

## 2016-08-27 LAB — CBC WITH DIFFERENTIAL/PLATELET
BASO%: 0.3 % (ref 0.0–2.0)
Basophils Absolute: 0 10*3/uL (ref 0.0–0.1)
EOS%: 1.5 % (ref 0.0–7.0)
Eosinophils Absolute: 0.1 10*3/uL (ref 0.0–0.5)
HEMATOCRIT: 30 % — AB (ref 34.8–46.6)
HGB: 10.4 g/dL — ABNORMAL LOW (ref 11.6–15.9)
LYMPH#: 0.7 10*3/uL — AB (ref 0.9–3.3)
LYMPH%: 10.6 % — AB (ref 14.0–49.7)
MCH: 29.9 pg (ref 25.1–34.0)
MCHC: 34.7 g/dL (ref 31.5–36.0)
MCV: 86.2 fL (ref 79.5–101.0)
MONO#: 0.8 10*3/uL (ref 0.1–0.9)
MONO%: 11.5 % (ref 0.0–14.0)
NEUT%: 76.1 % (ref 38.4–76.8)
NEUTROS ABS: 5 10*3/uL (ref 1.5–6.5)
Platelets: 218 10*3/uL (ref 145–400)
RBC: 3.48 10*6/uL — ABNORMAL LOW (ref 3.70–5.45)
RDW: 13.2 % (ref 11.2–14.5)
WBC: 6.6 10*3/uL (ref 3.9–10.3)
nRBC: 0 % (ref 0–0)

## 2016-08-27 LAB — MAGNESIUM: Magnesium: 2.1 mg/dl (ref 1.5–2.5)

## 2016-08-27 MED ORDER — SODIUM CHLORIDE 0.9 % IV SOLN
356.0000 mg | Freq: Once | INTRAVENOUS | Status: AC
  Administered 2016-08-27: 360 mg via INTRAVENOUS
  Filled 2016-08-27: qty 36

## 2016-08-27 MED ORDER — FAMOTIDINE IN NACL 20-0.9 MG/50ML-% IV SOLN
INTRAVENOUS | Status: AC
  Filled 2016-08-27: qty 50

## 2016-08-27 MED ORDER — DIPHENHYDRAMINE HCL 50 MG/ML IJ SOLN
50.0000 mg | Freq: Once | INTRAMUSCULAR | Status: AC
  Administered 2016-08-27: 50 mg via INTRAVENOUS

## 2016-08-27 MED ORDER — PALONOSETRON HCL INJECTION 0.25 MG/5ML
0.2500 mg | Freq: Once | INTRAVENOUS | Status: AC
  Administered 2016-08-27: 0.25 mg via INTRAVENOUS

## 2016-08-27 MED ORDER — SODIUM CHLORIDE 0.9 % IV SOLN
Freq: Once | INTRAVENOUS | Status: AC
  Administered 2016-08-27: 08:00:00 via INTRAVENOUS

## 2016-08-27 MED ORDER — FAMOTIDINE IN NACL 20-0.9 MG/50ML-% IV SOLN
20.0000 mg | Freq: Two times a day (BID) | INTRAVENOUS | Status: DC
  Administered 2016-08-27: 20 mg via INTRAVENOUS

## 2016-08-27 MED ORDER — DEXAMETHASONE SODIUM PHOSPHATE 10 MG/ML IJ SOLN
10.0000 mg | Freq: Once | INTRAMUSCULAR | Status: AC
  Administered 2016-08-27: 10 mg via INTRAVENOUS

## 2016-08-27 MED ORDER — PALONOSETRON HCL INJECTION 0.25 MG/5ML
INTRAVENOUS | Status: AC
  Filled 2016-08-27: qty 5

## 2016-08-27 MED ORDER — DIPHENHYDRAMINE HCL 50 MG/ML IJ SOLN
INTRAMUSCULAR | Status: AC
  Filled 2016-08-27: qty 1

## 2016-08-27 MED ORDER — CETUXIMAB CHEMO IV INJECTION 200 MG/100ML
400.0000 mg/m2 | Freq: Once | INTRAVENOUS | Status: AC
  Administered 2016-08-27: 800 mg via INTRAVENOUS
  Filled 2016-08-27: qty 400

## 2016-08-27 MED ORDER — SODIUM CHLORIDE 0.9 % IV SOLN: 10.0000 mg | Freq: Once | INTRAVENOUS | Status: DC

## 2016-08-27 MED ORDER — DEXAMETHASONE SODIUM PHOSPHATE 10 MG/ML IJ SOLN
INTRAMUSCULAR | Status: AC
  Filled 2016-08-27: qty 1

## 2016-08-27 MED ORDER — SODIUM CHLORIDE 0.9 % IV SOLN
7000.0000 mg | INTRAVENOUS | Status: DC
  Administered 2016-08-27: 7000 mg via INTRAVENOUS
  Filled 2016-08-27: qty 140

## 2016-08-27 NOTE — Progress Notes (Signed)
Oncology Nurse Navigator Documentation  To provide support/encouragment, visited Ms. Croke in Infusion where she was receiving first cycle of carboplatin/5 FU.  She indicated she was very apprehensive over the weekend about today's treatment, did not sleep well last night.  I recognized her anxiety/concern.  She noted she called her patient mentor who was assigned when she started her first tmts months ago.  She stated it was very helpful to talk with her.  I congratulated her on initiating the call, encouraged her to continue communication with her. She expressed appreciation for my seeing her, voiced understanding I can be contacted with needs/concerns.  [name], RN, BSN, [place] [facility] Nurse [facility] [phone]

## 2016-08-27 NOTE — Progress Notes (Signed)
Per Arbie Cookey in Pharmacy okay to give Aloxi 30 minutes into 1 hour post observation. Pt tolerated infusion well. Pt stable at discharge.

## 2016-08-27 NOTE — Patient Instructions (Signed)
[facility] Discharge Instructions for Patients Receiving Chemotherapy  Today you received the following chemotherapy agents: Erbitux, Carboplatin and Adrucil.   To help prevent nausea and vomiting after your treatment, we encourage you to take your nausea medication as directed.    If you develop nausea and vomiting that is not controlled by your nausea medication, call the clinic.   BELOW ARE SYMPTOMS THAT SHOULD BE REPORTED IMMEDIATELY:  *FEVER GREATER THAN 100.5 F  *CHILLS WITH OR WITHOUT FEVER  NAUSEA AND VOMITING THAT IS NOT CONTROLLED WITH YOUR NAUSEA MEDICATION  *UNUSUAL SHORTNESS OF BREATH  *UNUSUAL BRUISING OR BLEEDING  TENDERNESS IN MOUTH AND THROAT WITH OR WITHOUT PRESENCE OF ULCERS  *URINARY PROBLEMS  *BOWEL PROBLEMS  UNUSUAL RASH Items with * indicate a potential emergency and should be followed up as soon as possible.  Feel free to call the clinic you have any questions or concerns. The clinic phone number is [phone].  Please show the [facility] at check-in to the Emergency Department and triage nurse.  Palonosetron Injection What is this medicine? PALONOSETRON (pal oh NOE se tron) is used to prevent nausea and vomiting caused by chemotherapy. It also helps prevent delayed nausea and vomiting that may occur a few days after your treatment. This medicine may be used for other purposes; ask your health care provider or pharmacist if you have questions. COMMON BRAND NAME(S): Aloxi What should I tell my health care provider before I take this medicine? They need to know if you have any of these conditions: -an unusual or allergic reaction to palonosetron, dolasetron, granisetron, ondansetron, other medicines, foods, dyes, or preservatives -pregnant or trying to get pregnant -breast-feeding How should I use this medicine? This medicine is for infusion into a vein. It is given by a health care professional in a hospital or clinic  setting. Talk to your pediatrician regarding the use of this medicine in children. While this drug may be prescribed for children as young as 1 month for selected conditions, precautions do apply. Overdosage: If you think you have taken too much of this medicine contact a poison control center or emergency room at once. NOTE: This medicine is only for you. Do not share this medicine with others. What if I miss a dose? This does not apply. What may interact with this medicine? -certain medicines for depression, anxiety, or psychotic disturbances -fentanyl -linezolid -MAOIs like Carbex, Eldepryl, Marplan, Nardil, and Parnate -methylene blue (injected into a vein) -tramadol This list may not describe all possible interactions. Give your health care provider a list of all the medicines, herbs, non-prescription drugs, or dietary supplements you use. Also tell them if you smoke, drink alcohol, or use illegal drugs. Some items may interact with your medicine. What should I watch for while using this medicine? Your condition will be monitored carefully while you are receiving this medicine. What side effects may I notice from receiving this medicine? Side effects that you should report to your doctor or health care professional as soon as possible: -allergic reactions like skin rash, itching or hives, swelling of the face, lips, or tongue -breathing problems -confusion -dizziness -fast, irregular heartbeat -fever and chills -loss of balance or coordination -seizures -sweating -swelling of the hands and feet -tremors -unusually weak or tired Side effects that usually do not require medical attention (report to your doctor or health care professional if they continue or are bothersome): -constipation or diarrhea -headache This list may not describe all possible side effects.  Call your doctor for medical advice about side effects. You may report side effects to FDA at 1-800-FDA-1088. Where  should I keep my medicine? This drug is given in a hospital or clinic and will not be stored at home. NOTE: This sheet is a summary. It may not cover all possible information. If you have questions about this medicine, talk to your doctor, pharmacist, or health care provider.  2018 Elsevier/Gold Standard (2012-10-24 10:38:36)   Cetuximab injection What is this medicine? CETUXIMAB (se TUX i mab) is a monoclonal antibody. It is used to treat colorectal cancer and head and neck cancer. This medicine may be used for other purposes; ask your health care provider or pharmacist if you have questions. COMMON BRAND NAME(S): Erbitux What should I tell my health care provider before I take this medicine? They need to know if you have any of these conditions: -heart disease -history of irregular heartbeat -history of low levels of calcium, magnesium, or potassium in the blood -lung or breathing disease, like asthma -an unusual or allergic reaction to cetuximab, other medicines, foods, dyes, or preservatives -pregnant or trying to get pregnant -breast-feeding How should I use this medicine? This drug is given as an infusion into a vein. It is administered in a hospital or clinic by a specially trained health care professional. Talk to your pediatrician regarding the use of this medicine in children. Special care may be needed. Overdosage: If you think you have taken too much of this medicine contact a poison control center or emergency room at once. NOTE: This medicine is only for you. Do not share this medicine with others. What if I miss a dose? It is important not to miss your dose. Call your doctor or health care professional if you are unable to keep an appointment. What may interact with this medicine? Interactions are not expected. This list may not describe all possible interactions. Give your health care provider a list of all the medicines, herbs, non-prescription drugs, or dietary  supplements you use. Also tell them if you smoke, drink alcohol, or use illegal drugs. Some items may interact with your medicine. What should I watch for while using this medicine? Visit your doctor or health care professional for regular checks on your progress. This drug may make you feel generally unwell. This is not uncommon, as chemotherapy can affect healthy cells as well as cancer cells. Report any side effects. Continue your course of treatment even though you feel ill unless your doctor tells you to stop. This medicine can make you more sensitive to the sun. Keep out of the sun while taking this medicine and for 2 months after the last dose. If you cannot avoid being in the sun, wear protective clothing and use sunscreen. Do not use sun lamps or tanning beds/booths. You may need blood work done while you are taking this medicine. In some cases, you may be given additional medicines to help with side effects. Follow all directions for their use. Call your doctor or health care professional for advice if you get a fever, chills or sore throat, or other symptoms of a cold or flu. Do not treat yourself. This drug decreases your body's ability to fight infections. Try to avoid being around people who are sick. Avoid taking products that contain aspirin, acetaminophen, ibuprofen, naproxen, or ketoprofen unless instructed by your doctor. These medicines may hide a fever. Do not become pregnant while taking this medicine. Women should inform their doctor if they wish to  become pregnant or think they might be pregnant. There is a potential for serious side effects to an unborn child. Use adequate birth control methods. Avoid pregnancy for at least 6 months after your last dose. Talk to your health care professional or pharmacist for more information. Do not breast-feed an infant while taking this medicine or during the 2 months after your last dose. What side effects may I notice from receiving this  medicine? Side effects that you should report to your doctor or health care professional as soon as possible: -allergic reactions like skin rash, itching or hives, swelling of the face, lips, or tongue -breathing problems -changes in vision -fast, irregular heartbeat -feeling faint or lightheaded, falls -fever, chills -mouth sores -redness, blistering, peeling or loosening of the skin, including inside the mouth -trouble passing urine or change in the amount of urine -unusually weak or tired Side effects that usually do not require medical attention (report to your doctor or health care professional if they continue or are bothersome): -changes in skin like acne, cracks, skin dryness -constipation -diarrhea -headache -nail changes -nausea, vomiting -stomach upset -weight loss This list may not describe all possible side effects. Call your doctor for medical advice about side effects. You may report side effects to FDA at 1-800-FDA-1088. Where should I keep my medicine? This drug is given in a hospital or clinic and will not be stored at home. NOTE: This sheet is a summary. It may not cover all possible information. If you have questions about this medicine, talk to your doctor, pharmacist, or health care provider.  2018 Elsevier/Gold Standard (2014-02-24 22:27:08)    Carboplatin injection What is this medicine? CARBOPLATIN (KAR boe pla tin) is a chemotherapy drug. It targets fast dividing cells, like cancer cells, and causes these cells to die. This medicine is used to treat ovarian cancer and many other cancers. This medicine may be used for other purposes; ask your health care provider or pharmacist if you have questions. COMMON BRAND NAME(S): Paraplatin What should I tell my health care provider before I take this medicine? They need to know if you have any of these conditions: -blood disorders -hearing problems -kidney disease -recent or ongoing radiation therapy -an  unusual or allergic reaction to carboplatin, cisplatin, other chemotherapy, other medicines, foods, dyes, or preservatives -pregnant or trying to get pregnant -breast-feeding How should I use this medicine? This drug is usually given as an infusion into a vein. It is administered in a hospital or clinic by a specially trained health care professional. Talk to your pediatrician regarding the use of this medicine in children. Special care may be needed. Overdosage: If you think you have taken too much of this medicine contact a poison control center or emergency room at once. NOTE: This medicine is only for you. Do not share this medicine with others. What if I miss a dose? It is important not to miss a dose. Call your doctor or health care professional if you are unable to keep an appointment. What may interact with this medicine? -medicines for seizures -medicines to increase blood counts like filgrastim, pegfilgrastim, sargramostim -some antibiotics like amikacin, gentamicin, neomycin, streptomycin, tobramycin -vaccines Talk to your doctor or health care professional before taking any of these medicines: -acetaminophen -aspirin -ibuprofen -ketoprofen -naproxen This list may not describe all possible interactions. Give your health care provider a list of all the medicines, herbs, non-prescription drugs, or dietary supplements you use. Also tell them if you smoke, drink alcohol, or use  illegal drugs. Some items may interact with your medicine. What should I watch for while using this medicine? Your condition will be monitored carefully while you are receiving this medicine. You will need important blood work done while you are taking this medicine. This drug may make you feel generally unwell. This is not uncommon, as chemotherapy can affect healthy cells as well as cancer cells. Report any side effects. Continue your course of treatment even though you feel ill unless your doctor tells you to  stop. In some cases, you may be given additional medicines to help with side effects. Follow all directions for their use. Call your doctor or health care professional for advice if you get a fever, chills or sore throat, or other symptoms of a cold or flu. Do not treat yourself. This drug decreases your body's ability to fight infections. Try to avoid being around people who are sick. This medicine may increase your risk to bruise or bleed. Call your doctor or health care professional if you notice any unusual bleeding. Be careful brushing and flossing your teeth or using a toothpick because you may get an infection or bleed more easily. If you have any dental work done, tell your dentist you are receiving this medicine. Avoid taking products that contain aspirin, acetaminophen, ibuprofen, naproxen, or ketoprofen unless instructed by your doctor. These medicines may hide a fever. Do not become pregnant while taking this medicine. Women should inform their doctor if they wish to become pregnant or think they might be pregnant. There is a potential for serious side effects to an unborn child. Talk to your health care professional or pharmacist for more information. Do not breast-feed an infant while taking this medicine. What side effects may I notice from receiving this medicine? Side effects that you should report to your doctor or health care professional as soon as possible: -allergic reactions like skin rash, itching or hives, swelling of the face, lips, or tongue -signs of infection - fever or chills, cough, sore throat, pain or difficulty passing urine -signs of decreased platelets or bleeding - bruising, pinpoint red spots on the skin, black, tarry stools, nosebleeds -signs of decreased red blood cells - unusually weak or tired, fainting spells, lightheadedness -breathing problems -changes in hearing -changes in vision -chest pain -high blood pressure -low blood counts - This drug may  decrease the number of white blood cells, red blood cells and platelets. You may be at increased risk for infections and bleeding. -nausea and vomiting -pain, swelling, redness or irritation at the injection site -pain, tingling, numbness in the hands or feet -problems with balance, talking, walking -trouble passing urine or change in the amount of urine Side effects that usually do not require medical attention (report to your doctor or health care professional if they continue or are bothersome): -hair loss -loss of appetite -metallic taste in the mouth or changes in taste This list may not describe all possible side effects. Call your doctor for medical advice about side effects. You may report side effects to FDA at 1-800-FDA-1088. Where should I keep my medicine? This drug is given in a hospital or clinic and will not be stored at home. NOTE: This sheet is a summary. It may not cover all possible information. If you have questions about this medicine, talk to your doctor, pharmacist, or health care provider.  2018 Elsevier/Gold Standard (2007-03-25 14:38:05)    Fluorouracil, 5-FU injection What is this medicine? FLUOROURACIL, 5-FU (flure oh YOOR a sil) is  a chemotherapy drug. It slows the growth of cancer cells. This medicine is used to treat many types of cancer like breast cancer, colon or rectal cancer, pancreatic cancer, and stomach cancer. This medicine may be used for other purposes; ask your health care provider or pharmacist if you have questions. COMMON BRAND NAME(S): Adrucil What should I tell my health care provider before I take this medicine? They need to know if you have any of these conditions: -blood disorders -dihydropyrimidine dehydrogenase (DPD) deficiency -infection (especially a virus infection such as chickenpox, cold sores, or herpes) -kidney disease -liver disease -malnourished, poor nutrition -recent or ongoing radiation therapy -an unusual or allergic  reaction to fluorouracil, other chemotherapy, other medicines, foods, dyes, or preservatives -pregnant or trying to get pregnant -breast-feeding How should I use this medicine? This drug is given as an infusion or injection into a vein. It is administered in a hospital or clinic by a specially trained health care professional. Talk to your pediatrician regarding the use of this medicine in children. Special care may be needed. Overdosage: If you think you have taken too much of this medicine contact a poison control center or emergency room at once. NOTE: This medicine is only for you. Do not share this medicine with others. What if I miss a dose? It is important not to miss your dose. Call your doctor or health care professional if you are unable to keep an appointment. What may interact with this medicine? -allopurinol -cimetidine -dapsone -digoxin -hydroxyurea -leucovorin -levamisole -medicines for seizures like ethotoin, fosphenytoin, phenytoin -medicines to increase blood counts like filgrastim, pegfilgrastim, sargramostim -medicines that treat or prevent blood clots like warfarin, enoxaparin, and dalteparin -methotrexate -metronidazole -pyrimethamine -some other chemotherapy drugs like busulfan, cisplatin, estramustine, vinblastine -trimethoprim -trimetrexate -vaccines Talk to your doctor or health care professional before taking any of these medicines: -acetaminophen -aspirin -ibuprofen -ketoprofen -naproxen This list may not describe all possible interactions. Give your health care provider a list of all the medicines, herbs, non-prescription drugs, or dietary supplements you use. Also tell them if you smoke, drink alcohol, or use illegal drugs. Some items may interact with your medicine. What should I watch for while using this medicine? Visit your doctor for checks on your progress. This drug may make you feel generally unwell. This is not uncommon, as chemotherapy can  affect healthy cells as well as cancer cells. Report any side effects. Continue your course of treatment even though you feel ill unless your doctor tells you to stop. In some cases, you may be given additional medicines to help with side effects. Follow all directions for their use. Call your doctor or health care professional for advice if you get a fever, chills or sore throat, or other symptoms of a cold or flu. Do not treat yourself. This drug decreases your body's ability to fight infections. Try to avoid being around people who are sick. This medicine may increase your risk to bruise or bleed. Call your doctor or health care professional if you notice any unusual bleeding. Be careful brushing and flossing your teeth or using a toothpick because you may get an infection or bleed more easily. If you have any dental work done, tell your dentist you are receiving this medicine. Avoid taking products that contain aspirin, acetaminophen, ibuprofen, naproxen, or ketoprofen unless instructed by your doctor. These medicines may hide a fever. Do not become pregnant while taking this medicine. Women should inform their doctor if they wish to become pregnant or think  they might be pregnant. There is a potential for serious side effects to an unborn child. Talk to your health care professional or pharmacist for more information. Do not breast-feed an infant while taking this medicine. Men should inform their doctor if they wish to father a child. This medicine may lower sperm counts. Do not treat diarrhea with over the counter products. Contact your doctor if you have diarrhea that lasts more than 2 days or if it is severe and watery. This medicine can make you more sensitive to the sun. Keep out of the sun. If you cannot avoid being in the sun, wear protective clothing and use sunscreen. Do not use sun lamps or tanning beds/booths. What side effects may I notice from receiving this medicine? Side effects that  you should report to your doctor or health care professional as soon as possible: -allergic reactions like skin rash, itching or hives, swelling of the face, lips, or tongue -low blood counts - this medicine may decrease the number of white blood cells, red blood cells and platelets. You may be at increased risk for infections and bleeding. -signs of infection - fever or chills, cough, sore throat, pain or difficulty passing urine -signs of decreased platelets or bleeding - bruising, pinpoint red spots on the skin, black, tarry stools, blood in the urine -signs of decreased red blood cells - unusually weak or tired, fainting spells, lightheadedness -breathing problems -changes in vision -chest pain -mouth sores -nausea and vomiting -pain, swelling, redness at site where injected -pain, tingling, numbness in the hands or feet -redness, swelling, or sores on hands or feet -stomach pain -unusual bleeding Side effects that usually do not require medical attention (report to your doctor or health care professional if they continue or are bothersome): -changes in finger or toe nails -diarrhea -dry or itchy skin -hair loss -headache -loss of appetite -sensitivity of eyes to the light -stomach upset -unusually teary eyes This list may not describe all possible side effects. Call your doctor for medical advice about side effects. You may report side effects to FDA at 1-800-FDA-1088. Where should I keep my medicine? This drug is given in a hospital or clinic and will not be stored at home. NOTE: This sheet is a summary. It may not cover all possible information. If you have questions about this medicine, talk to your doctor, pharmacist, or health care provider.  2018 Elsevier/Gold Standard (2007-04-23 13:53:16)

## 2016-08-28 ENCOUNTER — Telehealth: Payer: Self-pay | Admitting: *Deleted

## 2016-08-28 ENCOUNTER — Encounter: Payer: Self-pay | Admitting: Radiation Oncology

## 2016-08-28 ENCOUNTER — Other Ambulatory Visit: Payer: Self-pay | Admitting: Hematology and Oncology

## 2016-08-28 NOTE — Telephone Encounter (Signed)
-----   Message from Egbert Garibaldi, RN sent at 08/27/2016  4:00 PM EDT ----- Regarding: Alvy Bimler chemo f/u call  Pt of Dr. Alvy Bimler, first time Erbitux, Carboplatin, and Adrucil ( 96 hour pump) pt tolerated well.

## 2016-08-28 NOTE — Progress Notes (Signed)
Spoke with [name] this morning and was told to disregard fax that we received from [employer] with claimant, Tiffany Yu on it.

## 2016-08-28 NOTE — Telephone Encounter (Signed)
LM for chemo follow up call. To call if having any problems or concerns.

## 2016-08-29 ENCOUNTER — Other Ambulatory Visit: Payer: Self-pay | Admitting: Hematology and Oncology

## 2016-08-29 ENCOUNTER — Telehealth: Payer: Self-pay | Admitting: *Deleted

## 2016-08-29 ENCOUNTER — Other Ambulatory Visit: Payer: Self-pay | Admitting: *Deleted

## 2016-08-29 MED ORDER — HYDROMORPHONE HCL 1 MG/ML PO LIQD: 4.0000 mg | ORAL | 0 refills | Status: DC | PRN

## 2016-08-29 MED ORDER — LORAZEPAM 1 MG PO TABS: ORAL_TABLET | ORAL | 0 refills | Status: DC

## 2016-08-29 MED FILL — LORazepam 1 MG TABS: 1 | 20 days supply | Qty: 60 | Fill #0

## 2016-08-29 NOTE — Telephone Encounter (Signed)
Oncology Nurse Navigator Documentation  Received VMM from patient husband indicating Ms. Habig needs refill for Lorazepam 1 mg tablet, requested sent to [facility].  Dr. Alvy Bimler, [place] notified.  [name], RN, BSN, [place] On[facility] Nurse [place] [facility] [phone]

## 2016-08-31 ENCOUNTER — Telehealth: Payer: Self-pay | Admitting: *Deleted

## 2016-08-31 ENCOUNTER — Ambulatory Visit (HOSPITAL_BASED_OUTPATIENT_CLINIC_OR_DEPARTMENT_OTHER): Payer: 59

## 2016-08-31 VITALS — BP 124/64 | HR 100 | Temp 98.4°F

## 2016-08-31 DIAGNOSIS — C023 Malignant neoplasm of anterior two-thirds of tongue, part unspecified: Secondary | ICD-10-CM | POA: Diagnosis not present

## 2016-08-31 MED ORDER — SODIUM CHLORIDE 0.9% FLUSH
10.0000 mL | INTRAVENOUS | Status: DC | PRN
  Administered 2016-08-31: 10 mL
  Filled 2016-08-31: qty 10

## 2016-08-31 MED ORDER — HEPARIN SOD (PORK) LOCK FLUSH 100 UNIT/ML IV SOLN
500.0000 [IU] | Freq: Once | INTRAVENOUS | Status: AC | PRN
  Administered 2016-08-31: 500 [IU]
  Filled 2016-08-31: qty 5

## 2016-08-31 MED FILL — HYDROMORPHONE 5 MG/5 ML SOL: 1 | 4 days supply | Qty: 127 | Fill #0

## 2016-08-31 NOTE — Telephone Encounter (Signed)
[facility] called to say they only filled 100 ml of Dilaudid due to insurance

## 2016-08-31 NOTE — Patient Instructions (Signed)
Implanted Port Home Guide An implanted port is a type of central line that is placed under the skin. Central lines are used to provide IV access when treatment or nutrition needs to be given through a person's veins. Implanted ports are used for long-term IV access. An implanted port may be placed because:  You need IV medicine that would be irritating to the small veins in your hands or arms.  You need long-term IV medicines, such as antibiotics.  You need IV nutrition for a long period.  You need frequent blood draws for lab tests.  You need dialysis.  Implanted ports are usually placed in the chest area, but they can also be placed in the upper arm, the abdomen, or the leg. An implanted port has two main parts:  Reservoir. The reservoir is round and will appear as a small, raised area under your skin. The reservoir is the part where a needle is inserted to give medicines or draw blood.  Catheter. The catheter is a thin, flexible tube that extends from the reservoir. The catheter is placed into a large vein. Medicine that is inserted into the reservoir goes into the catheter and then into the vein.  How will I care for my incision site? Do not get the incision site wet. Bathe or shower as directed by your health care provider. How is my port accessed? Special steps must be taken to access the port:  Before the port is accessed, a numbing cream can be placed on the skin. This helps numb the skin over the port site.  Your health care provider uses a sterile technique to access the port. ? Your health care provider must put on a mask and sterile gloves. ? The skin over your port is cleaned carefully with an antiseptic and allowed to dry. ? The port is gently pinched between sterile gloves, and a needle is inserted into the port.  Only "non-coring" port needles should be used to access the port. Once the port is accessed, a blood return should be checked. This helps ensure that the port  is in the vein and is not clogged.  If your port needs to remain accessed for a constant infusion, a clear (transparent) bandage will be placed over the needle site. The bandage and needle will need to be changed every week, or as directed by your health care provider.  Keep the bandage covering the needle clean and dry. Do not get it wet. Follow your health care provider's instructions on how to take a shower or bath while the port is accessed.  If your port does not need to stay accessed, no bandage is needed over the port.  What is flushing? Flushing helps keep the port from getting clogged. Follow your health care provider's instructions on how and when to flush the port. Ports are usually flushed with saline solution or a medicine called heparin. The need for flushing will depend on how the port is used.  If the port is used for intermittent medicines or blood draws, the port will need to be flushed: ? After medicines have been given. ? After blood has been drawn. ? As part of routine maintenance.  If a constant infusion is running, the port may not need to be flushed.  How long will my port stay implanted? The port can stay in for as long as your health care provider thinks it is needed. When it is time for the port to come out, surgery will be   done to remove it. The procedure is similar to the one performed when the port was put in. When should I seek immediate medical care? When you have an implanted port, you should seek immediate medical care if:  You notice a bad smell coming from the incision site.  You have swelling, redness, or drainage at the incision site.  You have more swelling or pain at the port site or the surrounding area.  You have a fever that is not controlled with medicine.  This information is not intended to replace advice given to you by your health care provider. Make sure you discuss any questions you have with your health care provider. Document  Released: 12/18/2004 Document Revised: 05/26/2015 Document Reviewed: 08/25/2012 Elsevier Interactive Patient Education  2017 [employer]  

## 2016-09-01 ENCOUNTER — Other Ambulatory Visit: Payer: Self-pay | Admitting: Hematology

## 2016-09-04 ENCOUNTER — Telehealth: Payer: Self-pay | Admitting: *Deleted

## 2016-09-04 NOTE — Telephone Encounter (Signed)
-----   Message from [name], MD sent at 09/04/2016  7:28 AM EDT ----- Regarding: advanced home care Please touch base with patient re: [facility]

## 2016-09-04 NOTE — Telephone Encounter (Signed)
Left message to call Dr Gorsuch's nurse 

## 2016-09-04 NOTE — Telephone Encounter (Signed)
Spoke with patient and let her know that the IVF with [facility] are as only as needed. She does not have to get fluids every day. Verbalized understanding

## 2016-09-06 ENCOUNTER — Ambulatory Visit: Payer: Self-pay | Admitting: Hematology and Oncology

## 2016-09-06 ENCOUNTER — Other Ambulatory Visit: Payer: Self-pay

## 2016-09-06 MED FILL — PROCHLORPERAZINE 10 MG TAB: 10 | 7 days supply | Qty: 30 | Fill #1

## 2016-09-06 MED FILL — ONDANSETRON HCL 8 MG TAB: 8 | 15 days supply | Qty: 30 | Fill #1

## 2016-09-07 MED FILL — OLANZapine 5 MG TABS: 5 | 30 days supply | Qty: 30 | Fill #3

## 2016-09-11 ENCOUNTER — Ambulatory Visit: Payer: 59

## 2016-09-11 ENCOUNTER — Other Ambulatory Visit (HOSPITAL_BASED_OUTPATIENT_CLINIC_OR_DEPARTMENT_OTHER): Payer: 59

## 2016-09-11 ENCOUNTER — Ambulatory Visit (HOSPITAL_BASED_OUTPATIENT_CLINIC_OR_DEPARTMENT_OTHER): Payer: 59 | Admitting: Hematology and Oncology

## 2016-09-11 ENCOUNTER — Telehealth: Payer: Self-pay | Admitting: Hematology and Oncology

## 2016-09-11 ENCOUNTER — Ambulatory Visit (HOSPITAL_BASED_OUTPATIENT_CLINIC_OR_DEPARTMENT_OTHER): Payer: 59

## 2016-09-11 ENCOUNTER — Encounter: Payer: Self-pay | Admitting: Hematology and Oncology

## 2016-09-11 VITALS — BP 123/82 | HR 99 | Resp 20

## 2016-09-11 DIAGNOSIS — L270 Generalized skin eruption due to drugs and medicaments taken internally: Secondary | ICD-10-CM | POA: Diagnosis not present

## 2016-09-11 DIAGNOSIS — D61818 Other pancytopenia: Secondary | ICD-10-CM | POA: Diagnosis not present

## 2016-09-11 DIAGNOSIS — R112 Nausea with vomiting, unspecified: Secondary | ICD-10-CM | POA: Diagnosis not present

## 2016-09-11 DIAGNOSIS — Z5112 Encounter for antineoplastic immunotherapy: Secondary | ICD-10-CM | POA: Diagnosis not present

## 2016-09-11 DIAGNOSIS — C023 Malignant neoplasm of anterior two-thirds of tongue, part unspecified: Secondary | ICD-10-CM

## 2016-09-11 DIAGNOSIS — G893 Neoplasm related pain (acute) (chronic): Secondary | ICD-10-CM

## 2016-09-11 DIAGNOSIS — N183 Chronic kidney disease, stage 3 unspecified: Secondary | ICD-10-CM

## 2016-09-11 LAB — CBC WITH DIFFERENTIAL/PLATELET
BASO%: 0.4 % (ref 0.0–2.0)
Basophils Absolute: 0 10*3/uL (ref 0.0–0.1)
EOS ABS: 0.2 10*3/uL (ref 0.0–0.5)
EOS%: 11.3 % — AB (ref 0.0–7.0)
HCT: 30.3 % — ABNORMAL LOW (ref 34.8–46.6)
HGB: 10.5 g/dL — ABNORMAL LOW (ref 11.6–15.9)
LYMPH%: 21.2 % (ref 14.0–49.7)
MCH: 29.7 pg (ref 25.1–34.0)
MCHC: 34.7 g/dL (ref 31.5–36.0)
MCV: 85.6 fL (ref 79.5–101.0)
MONO#: 0.3 10*3/uL (ref 0.1–0.9)
MONO%: 15.4 % — AB (ref 0.0–14.0)
NEUT%: 51.7 % (ref 38.4–76.8)
NEUTROS ABS: 0.9 10*3/uL — AB (ref 1.5–6.5)
Platelets: 74 10*3/uL — ABNORMAL LOW (ref 145–400)
RBC: 3.54 10*6/uL — AB (ref 3.70–5.45)
RDW: 13.7 % (ref 11.2–14.5)
WBC: 1.7 10*3/uL — AB (ref 3.9–10.3)
lymph#: 0.4 10*3/uL — ABNORMAL LOW (ref 0.9–3.3)

## 2016-09-11 LAB — COMPREHENSIVE METABOLIC PANEL
ALBUMIN: 3.5 g/dL (ref 3.5–5.0)
ALK PHOS: 96 U/L (ref 40–150)
ALT: 12 U/L (ref 0–55)
ANION GAP: 9 meq/L (ref 3–11)
AST: 19 U/L (ref 5–34)
BILIRUBIN TOTAL: 0.62 mg/dL (ref 0.20–1.20)
BUN: 15.9 mg/dL (ref 7.0–26.0)
CALCIUM: 10.3 mg/dL (ref 8.4–10.4)
CO2: 28 meq/L (ref 22–29)
CREATININE: 1.3 mg/dL — AB (ref 0.6–1.1)
Chloride: 103 mEq/L (ref 98–109)
EGFR: 52 mL/min/{1.73_m2} — ABNORMAL LOW (ref >90)
Glucose: 98 mg/dl (ref 70–140)
Potassium: 3.8 mEq/L (ref 3.5–5.1)
Sodium: 141 mEq/L (ref 136–145)
TOTAL PROTEIN: 8 g/dL (ref 6.4–8.3)

## 2016-09-11 LAB — MAGNESIUM: MAGNESIUM: 2 mg/dL (ref 1.5–2.5)

## 2016-09-11 MED ORDER — HEPARIN SOD (PORK) LOCK FLUSH 100 UNIT/ML IV SOLN
500.0000 [IU] | Freq: Once | INTRAVENOUS | Status: AC | PRN
  Administered 2016-09-11: 500 [IU]
  Filled 2016-09-11: qty 5

## 2016-09-11 MED ORDER — SODIUM CHLORIDE 0.9% FLUSH
10.0000 mL | Freq: Once | INTRAVENOUS | Status: AC
  Administered 2016-09-11: 10 mL
  Filled 2016-09-11: qty 10

## 2016-09-11 MED ORDER — DIPHENHYDRAMINE HCL 50 MG/ML IJ SOLN
50.0000 mg | Freq: Once | INTRAMUSCULAR | Status: AC
  Administered 2016-09-11: 50 mg via INTRAVENOUS

## 2016-09-11 MED ORDER — SODIUM CHLORIDE 0.9% FLUSH
10.0000 mL | INTRAVENOUS | Status: DC | PRN
  Administered 2016-09-11: 10 mL
  Filled 2016-09-11: qty 10

## 2016-09-11 MED ORDER — CLINDAMYCIN PHOSPHATE 1 % EX GEL: Freq: Two times a day (BID) | CUTANEOUS | 0 refills | Status: DC

## 2016-09-11 MED ORDER — PROMETHAZINE HCL 25 MG/ML IJ SOLN
12.5000 mg | Freq: Once | INTRAMUSCULAR | Status: AC
  Administered 2016-09-11: 12.5 mg via INTRAVENOUS
  Filled 2016-09-11: qty 1

## 2016-09-11 MED ORDER — SODIUM CHLORIDE 0.9 % IV SOLN
Freq: Once | INTRAVENOUS | Status: AC
  Administered 2016-09-11: 13:00:00 via INTRAVENOUS

## 2016-09-11 MED ORDER — FENTANYL 12 MCG/HR TD PT72: 12.0000 ug | MEDICATED_PATCH | TRANSDERMAL | 0 refills | Status: DC

## 2016-09-11 MED ORDER — CETUXIMAB CHEMO IV INJECTION 200 MG/100ML
200.0000 mg/m2 | Freq: Once | INTRAVENOUS | Status: AC
Start: 2016-09-11 — End: 2016-09-11
  Administered 2016-09-11: 400 mg via INTRAVENOUS
  Filled 2016-09-11: qty 200

## 2016-09-11 MED ORDER — ONDANSETRON HCL 4 MG/2ML IJ SOLN
8.0000 mg | Freq: Once | INTRAMUSCULAR | Status: AC
  Administered 2016-09-11: 8 mg via INTRAVENOUS

## 2016-09-11 MED ORDER — FAMOTIDINE IN NACL 20-0.9 MG/50ML-% IV SOLN
INTRAVENOUS | Status: AC
  Filled 2016-09-11: qty 50

## 2016-09-11 MED ORDER — FAMOTIDINE IN NACL 20-0.9 MG/50ML-% IV SOLN
20.0000 mg | Freq: Once | INTRAVENOUS | Status: AC
  Administered 2016-09-11: 20 mg via INTRAVENOUS

## 2016-09-11 MED ORDER — FENTANYL 25 MCG/HR TD PT72: 25.0000 ug | MEDICATED_PATCH | TRANSDERMAL | 0 refills | Status: DC

## 2016-09-11 MED ORDER — ONDANSETRON HCL 4 MG/2ML IJ SOLN
INTRAMUSCULAR | Status: AC
  Filled 2016-09-11: qty 4

## 2016-09-11 MED ORDER — CLINDAMYCIN PHOSPHATE 1 % EX GEL: Freq: Two times a day (BID) | CUTANEOUS | 0 refills | Status: AC

## 2016-09-11 MED ORDER — DIPHENHYDRAMINE HCL 50 MG/ML IJ SOLN
INTRAMUSCULAR | Status: AC
Start: 2016-09-11 — End: 2016-09-11
  Filled 2016-09-11: qty 1

## 2016-09-11 MED FILL — fentaNYL 12 MCG/HR PT72: 12 | 15 days supply | Qty: 5 | Fill #0

## 2016-09-11 MED FILL — CLINDAMYCIN PH 1% GEL: 1 | 20 days supply | Qty: 30 | Fill #0

## 2016-09-11 NOTE — Assessment & Plan Note (Signed)
She has mild worsening mucositis pain due to side effects of treatment I recommend addition of low-dose fentanyl patch along with her liquid hydromorphone to take as needed I will reassess pain control next week

## 2016-09-11 NOTE — Progress Notes (Signed)
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 Tongue, biopsy, Anterior deep margin left SQUAMOUS CELL CARCINOMA 3. Tongue, biopsy, Posterior deep margin left NEGATIVE FOR CARCINOMA 4. Tongue, biopsy, Posterior margin left SQUAMOUS CELL CARCINOMA 5. Tongue, excisional biopsy, New anterior deep margin NEGATIVE FOR CARCINOMA 6. Tongue, excisional biopsy, New posterior deep margin SMALL FOCUS OF SQUAMOUS CELL CARCINOMA (0.1 CM, ONLY PRESENT AT THE DEEPER PERMINENT SECTION) 7. Tongue, resection for tumor, Left lateral INFILTRATIVE KERATINIZED SQUAMOUS CELL CARCINOMA (3.2 CM) THE TUMOR INVADES UNDERNEATH MUSCLE OF THE TONGUE (2.0 CM, PT2) LYMPHOVASCULAR AND PERINEURAL INVASION IDENTIFIED SQUAMOUS CELL CARCINOMA PRESENTED AT (FINAL MARGINS REFER TO PART 2-6) LATERAL INKED MARGIN (1.0 CM) MEDIAL INKED MARGIN (0.6 CM)      02/08/2016 Surgery    SURGICAL PROCEDURES: 1. Left hemiglossectomy with primary closure. 2. Left selective neck dissection (zones I-III).      03/02/2016 Imaging    CT chest with contrast: No evidence of metastatic disease in the chest. 2. Aortic atherosclerosis.       03/02/2016 Imaging    CT neck with contrast showed : Newly enlarged and round 10 mm  right level 1b lymph node (series 5, image 45). The level 1A nodes remain normal. This right 1 B node is nonspecific but suspicious for progressive nodal metastatic disease. It might be amenable to Ultrasound-guided FNA. 2. Interval left hemiglossectomy and selective left neck dissection with confluent indeterminate soft tissue from the anterior left submandibular space through the left carotid space and obscuring the left level 1B, level 2, and level IIIa nodal stations. This study will serve as a new postoperative baseline of this area. Attention directed on close interval follow-up. 3. Mild soft tissue thickening also along the left lateral tongue resection margin. Attention directed on followup. 4. Chest CT today reported separately      03/14/2016 Procedure    She has normal baseline hearing test      03/15/2016 Procedure    1. Successful placement of a right internal jugular approach power injectable Port-A-Cath. The Port a catheter is ready for immediate use. 2. Successful fluoroscopic insertion of a 20-French pull-through gastrostomy tube. The gastrostomy may be used immediately for medication administration and may be utilized in 24 hrs for the initiation of feeds.      03/15/2016 - 04/30/2016 Radiation Therapy    Tongue and Bilateral Neck treated to 68 Gy in 33 fractions      03/16/2016 - 04/27/2016 Chemotherapy    She received weekly cisplatin. Cycle 2-6 reduced dose at 20 mg/m2 due to significant side-effects and poor tolerance       04/04/2016 Adverse Reaction    Chemotherapy is placed on hold due to acute renal failure      04/13/2016 Adverse Reaction    Chemo was placed on hold due to shingles outbreak and paronychia  05/29/2016 - 06/04/2016 Hospital Admission    She was admitted to the hospital for management of nausea, vomiting and inability to tolerate tube feeding      06/06/2016 - 06/13/2016 Hospital Admission    She was admitted to the hospital for management of nausea,  vomiting and inability to tolerate tube feeding      08/09/2016 Imaging    1. New bilateral upper lobe ground-glass nodules. Favor infectious or inflammatory process. Recommend close attention on routine follow-up. 2. Angular nodule RIGHT upper lobe is also favored infectious or inflammatory. Recommend attention on follow-up as above. 3. No mediastinal or supraclavicular lymphadenopathy. 4. Small RIGHT thyroid nodule does not meet biopsy criteria. Please see separate CT neck dictation.      08/09/2016 Imaging    1. Progression of disease with new/markedly worsened mass of the left base of tongue measuring 2.9 x 2.2 x 2.3 centimeters. Persistent soft tissue thickening of the postoperative left neck extending from the angle of the mandible to the level of the hyoid bone. 2. Confluent, centrally necrotic mass in the right submental region, at the site of previously seen enlarged level 1B lymph node. This mass extends into the floor of the mouth and displaces or invades the right sublingual gland and the right mylohyoid and hyoglossus muscles. 3. Centrally necrotic right level IIa lymph node is new from the prior study and consistent with metastatic spread. No other lower cervical lymphadenopathy. 4. Multiple ground-glass opacities in the lung apices. Please see dedicated report for concomitant chest CT for more complete characterization. 5. Thickening of the epiglottis and aryepiglottic folds is likely secondary to prior radiation treatment. No acute airway compromise.      08/22/2016 PET scan    1. The recently described progressive cervical disease is markedly hypermetabolic. This includes local recurrence in the left base of tongue and a confluent right submental nodal mass. In addition, there are small hypermetabolic lymph nodes on the right in station IIb and the anterior to the lower trachea.  2. No typical metastatic disease within the chest, abdomen or pelvis. 3. The recently described  ground-glass opacities in both upper lobes have enlarged, become more dense and are associated with moderate hypermetabolic activity. These are new from February and probably inflammatory/infectious. CT follow-up after appropriate therapy recommended.      08/27/2016 -  Chemotherapy    She received palliative chemo with carboplatin/5FU and cetuximab      09/11/2016 Adverse Reaction    Dose of cetuximab is reduced due to skin toxicity       INTERVAL HISTORY: Please see below for problem oriented charting. She is seen prior to treatment. She developed significant acneform rash on her face and torso She have worsening mucositis pain No nausea or vomiting She is mildly constipated She has lost some weight due to reduced oral intake and poor appetite No recent fever, cough or chills.  REVIEW OF SYSTEMS:   Constitutional: Denies fevers, chills or abnormal weight loss Eyes: Denies blurriness of vision Respiratory: Denies cough, dyspnea or wheezes Cardiovascular: Denies palpitation, chest discomfort or lower extremity swelling Lymphatics: Denies new lymphadenopathy or easy bruising Neurological:Denies numbness, tingling or new weaknesses Behavioral/Psych: Mood is stable, no new changes  All other systems were reviewed with the patient and are negative.  I have reviewed the past medical history, past surgical history, social history and family history with the patient and they are unchanged from previous note.  ALLERGIES:  has No Known Allergies.  MEDICATIONS:  Current  Outpatient Prescriptions  Medication Sig Dispense Refill  . Amino Acids-Protein Hydrolys (FEEDING SUPPLEMENT, PRO-STAT SUGAR FREE 64,) LIQD Place 30 mLs into feeding tube daily. 900 mL 0  . chlorhexidine (PERIDEX) 0.12 % solution Rinse with 15 mls three times daily for 30 seconds. Use after breakfast, dinner, and at bedtime. Spit out excess. Do not swallow. 480 mL prn  . clindamycin (CLINDAGEL) 1 % gel Apply topically 2  (two) times daily. 30 g 0  . fentaNYL (DURAGESIC - DOSED MCG/HR) 12 MCG/HR Place 1 patch (12.5 mcg total) onto the skin every 3 (three) days. 5 patch 0  . HYDROmorphone HCl (DILAUDID) 1 MG/ML LIQD Place 4 mLs (4 mg total) into feeding tube every 3 (three) hours as needed for moderate pain or severe pain. 473 mL 0  . levothyroxine (SYNTHROID, LEVOTHROID) 75 MCG tablet Take 75 mcg by mouth daily before breakfast.     . lidocaine-prilocaine (EMLA) cream Apply to affected area once 30 g 3  . LORazepam (ATIVAN) 1 MG tablet TAKE 1 TABLET BY MOUTH EVERY 8 HOURS AS NEEDED FOR ANXIETY OR SLEEP 60 tablet 0  . mirtazapine (REMERON SOL-TAB) 15 MG disintegrating tablet Take 1 tablet (15 mg total) by mouth at bedtime. 30 tablet 9  . Nutritional Supplements (FEEDING SUPPLEMENT, VITAL 1.5 CAL,) LIQD D/C continuous feeding pump. Give Vital 1.5 or equivalent, 7 bottles daily, bolus feeding via PEG. Flush tube with 60 mL free water before and after bolus feeding QID. In addition, flush tube with 250 mL free water QID. Send formula and syringes. Pick up pump. 7 Can 4  . nystatin (MYCOSTATIN) 100000 UNIT/ML suspension Use as directed 5 mLs (500,000 Units total) in the mouth or throat 4 (four) times daily. 60 mL 0  . OLANZapine (ZYPREXA) 5 MG tablet Place 1 tablet (5 mg total) into feeding tube at bedtime. (Patient not taking: Reported on 08/10/2016) 30 tablet 9  . ondansetron (ZOFRAN ODT) 8 MG disintegrating tablet Take 1 tablet (8 mg total) by mouth every 8 (eight) hours as needed for nausea or vomiting. (Patient not taking: Reported on 08/10/2016) 30 tablet 5  . ondansetron (ZOFRAN) 8 MG tablet Take 1 tablet (8 mg total) by mouth 2 (two) times daily as needed for refractory nausea / vomiting. Start on day 3 after chemo. 30 tablet 1  . polyethylene glycol (MIRALAX / GLYCOLAX) packet Place 17 g into feeding tube daily. 14 each 0  . prochlorperazine (COMPAZINE) 10 MG tablet TAKE 1 TABLET BY MOUTH EVERY 6 HOURS AS NEEDED FOR  NAUSEA AND VOMITING (Patient not taking: Reported on 08/10/2016) 30 tablet 1  . prochlorperazine (COMPAZINE) 10 MG tablet Take 1 tablet (10 mg total) by mouth every 6 (six) hours as needed (Nausea or vomiting). 30 tablet 1  . senna (SENOKOT) 8.6 MG tablet Take 1 tablet by mouth 2 (two) times daily.     No current facility-administered medications for this visit.    Facility-Administered Medications Ordered in Other Visits  Medication Dose Route Frequency Provider Last Rate Last Dose  . 0.9 %  sodium chloride infusion   Intravenous Once Alvy Bimler, Fidel Caggiano, MD      . cetuximab (ERBITUX) chemo infusion 400 mg  200 mg/m2 (Treatment Plan Recorded) Intravenous Once Alvy Bimler, Kerington Hildebrant, MD      . diphenhydrAMINE (BENADRYL) injection 50 mg  50 mg Intravenous Once Alvy Bimler, Aariah Godette, MD      . famotidine (PEPCID) IVPB 20 mg premix  20 mg Intravenous Q12H [name], MD      .  heparin lock flush 100 unit/mL  500 Units Intracatheter Once PRN Alvy Bimler, Elazar Argabright, MD      . heparin lock flush 100 unit/mL  500 Units Intracatheter Once PRN Alvy Bimler, Gaelan Glennon, MD      . sodium chloride flush (NS) 0.9 % injection 10 mL  10 mL Intracatheter PRN Prabhnoor Ellenberger, MD      . sodium chloride flush (NS) 0.9 % injection 10 mL  10 mL Intracatheter PRN Alvy Bimler, Anaija Wissink, MD        PHYSICAL EXAMINATION: ECOG PERFORMANCE STATUS: 2 - Symptomatic, <50% confined to bed  Vitals:   09/11/16 1134  BP: 126/80  Pulse: (!) 116  Resp: 20  Temp: 99.4 F (37.4 C)  SpO2: 100%   Filed Weights   09/11/16 1134  Weight: 175 lb 9.6 oz (79.7 kg)    GENERAL:alert, no distress and comfortable SKIN: Noted classic acneform rash on her face and torso, grade 1-2 EYES: normal, Conjunctiva are pink and non-injected, sclera clear OROPHARYNX:no exudate, no erythema and lips, buccal mucosa, and tongue normal.  No significant mucositis or thrush NECK: supple, thyroid normal size, non-tender, without nodularity LYMPH:  no palpable lymphadenopathy in the cervical, axillary or  inguinal LUNGS: clear to auscultation and percussion with normal breathing effort HEART: regular rate & rhythm and no murmurs and no lower extremity edema ABDOMEN:abdomen soft, non-tender and normal bowel sounds Musculoskeletal:no cyanosis of digits and no clubbing  NEURO: alert & oriented x 3 with fluent speech, no focal motor/sensory deficits  LABORATORY DATA:  I have reviewed the data as listed    Component Value Date/Time   NA 141 09/11/2016 1116   K 3.8 09/11/2016 1116   CL 101 06/12/2016 0540   CO2 28 09/11/2016 1116   GLUCOSE 98 09/11/2016 1116   BUN 15.9 09/11/2016 1116   CREATININE 1.3 (H) 09/11/2016 1116   CALCIUM 10.3 09/11/2016 1116   PROT 8.0 09/11/2016 1116   ALBUMIN 3.5 09/11/2016 1116   AST 19 09/11/2016 1116   ALT 12 09/11/2016 1116   ALKPHOS 96 09/11/2016 1116   BILITOT 0.62 09/11/2016 1116   GFRNONAA 56 (L) 06/12/2016 0540   GFRAA >60 06/12/2016 0540    No results found for: SPEP, UPEP  Lab Results  Component Value Date   WBC 1.7 (L) 09/11/2016   NEUTROABS 0.9 (L) 09/11/2016   HGB 10.5 (L) 09/11/2016   HCT 30.3 (L) 09/11/2016   MCV 85.6 09/11/2016   PLT 74 (L) 09/11/2016      Chemistry      Component Value Date/Time   NA 141 09/11/2016 1116   K 3.8 09/11/2016 1116   CL 101 06/12/2016 0540   CO2 28 09/11/2016 1116   BUN 15.9 09/11/2016 1116   CREATININE 1.3 (H) 09/11/2016 1116      Component Value Date/Time   CALCIUM 10.3 09/11/2016 1116   ALKPHOS 96 09/11/2016 1116   AST 19 09/11/2016 1116   ALT 12 09/11/2016 1116   BILITOT 0.62 09/11/2016 1116       RADIOGRAPHIC STUDIES: I have personally reviewed the radiological images as listed and agreed with the findings in the report. Nm Pet Image Initial (pi) Skull Base To Thigh  Result Date: 08/22/2016 CLINICAL DATA:  Subsequent treatment strategy for squamous cell carcinoma of the left aspect of the tongue. Surgery 6 months ago with subsequent chemotherapy and radiation therapy. EXAM:  NUCLEAR MEDICINE PET SKULL BASE TO THIGH TECHNIQUE: 13.178 mCi F-18 FDG was injected intravenously. Full-ring PET imaging was performed from  the skull base to thigh after the radiotracer. CT data was obtained and used for attenuation correction and anatomic localization. FASTING BLOOD GLUCOSE:  Value: 90 mg/dl COMPARISON:  CT of the neck and chest 08/09/2016. FINDINGS: NECK Extensive tumor demonstrated on the recent neck CT is markedly hypermetabolic. The individual masses are better defined on the previous CT performed with contrast, although have not grossly changed. The infiltrating right submental lesion measures approximately 5.7 x 3.1 cm and demonstrates homogeneous hypermetabolic activity with an SUV max of 13.3. The tumor at the left tongue base measures approximately 3.7 x 2.9 cm on image 35 and has an SUV max of 12.0.9 mm level IIa node on the right (image 39) is also hypermetabolic with an SUV max of 5.6. There is also focal hypermetabolic activity anterior to the trachea along the inferior aspect of the right thyroid cartilage. This corresponds with a small soft tissue nodule and has an SUV max of 7.2. Activity along the arytenoid cartilage is symmetric and likely physiologic. No abnormal activity within the left thyroid nodule. CHEST There are no hypermetabolic mediastinal, hilar or axillary lymph nodes. There are enlarging and increasingly dense focal ground-glass opacities in both upper lobes. These are moderately hypermetabolic. 2.4 cm right upper lobe component on image 56 has an SUV max of 4.7. 2.0 cm left upper lobe component on image 55 has an SUV max of 5.3. There are other smaller components in both upper lobes. No definite lower lobe involvement or solid pulmonary nodule. ABDOMEN/PELVIS There is no hypermetabolic activity within the liver, adrenal glands, spleen or pancreas. There is no hypermetabolic nodal activity. Percutaneous G-tube noted. SKELETON There is no hypermetabolic activity to  suggest osseous metastatic disease. IMPRESSION: 1. The recently described progressive cervical disease is markedly hypermetabolic. This includes local recurrence in the left base of tongue and a confluent right submental nodal mass. In addition, there are small hypermetabolic lymph nodes on the right in station IIb and the anterior to the lower trachea. 2. No typical metastatic disease within the chest, abdomen or pelvis. 3. The recently described ground-glass opacities in both upper lobes have enlarged, become more dense and are associated with moderate hypermetabolic activity. These are new from February and probably inflammatory/infectious. CT follow-up after appropriate therapy recommended. Electronically Signed   By: Richardean Sale M.D.   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[place]) She tolerated cycle 1 of treatment with expected side effects such as acneform rash, mucositis pain, weight loss and altered taste sensation She is also pancytopenic today but it is not unexpected due to given recent treatment I will proceed with cetuximab with dose adjustment due to side effects I reinforced the importance of increase nutritional supplement as tolerated I will get advance home care to continue follow the patient at home and administer IV fluids as needed for supportive care  Pancytopenia, acquired [facility]) She has acquired pancytopenia, not unexpected given recent chemotherapy She does not need G-CSF support for now She does not need platelet of blood transfusion Will monitor closely  Cancer associated pain She has mild worsening mucositis pain due to side effects of treatment I recommend addition of low-dose fentanyl patch along with her liquid hydromorphone to take as needed I will reassess pain control next week  Dysphagia She has persistent dysphagia and is not taking anything by mouth I will continue nutritional supplement through feeding  tube If she continue to have progressive weight loss, I  will order continuous feeding via feeding tube  Acneiform drug eruption She has grade 1 skin toxicity I recommend reducing the dose of cetuximab and starting her on clindamycin gel I will reassess next week If is not better, I will consider oral antibiotic treatment next week  CKD (chronic kidney disease), stage III She has stable chronic renal failure stage III I recommend regular IV fluids at home through advance home care to prevent dehydration   No orders of the defined types were placed in this encounter.  All questions were answered. The patient knows to call the clinic with any problems, questions or concerns. No barriers to learning was detected. I spent 30 minutes counseling the patient face to face. The total time spent in the appointment was 40 minutes and more than 50% was on counseling and review of test results     [name], MD 09/11/2016 12:32 PM

## 2016-09-11 NOTE — Assessment & Plan Note (Signed)
She has persistent dysphagia and is not taking anything by mouth I will continue nutritional supplement through feeding tube If she continue to have progressive weight loss, I will order continuous feeding via feeding tube

## 2016-09-11 NOTE — Progress Notes (Signed)
Prior to cetuximab, patient began c/o N/V. Dr. Alvy Bimler aware. Medicated per orders. Patient verbalized complete resolution of nausea prior to initiating cetuximab.

## 2016-09-11 NOTE — Assessment & Plan Note (Signed)
She has acquired pancytopenia, not unexpected given recent chemotherapy She does not need G-CSF support for now She does not need platelet of blood transfusion Will monitor closely

## 2016-09-11 NOTE — Telephone Encounter (Signed)
Gave patients husband AVS and calendar of upcoming September appointments.

## 2016-09-11 NOTE — Assessment & Plan Note (Signed)
She tolerated cycle 1 of treatment with expected side effects such as acneform rash, mucositis pain, weight loss and altered taste sensation She is also pancytopenic today but it is not unexpected due to given recent treatment I will proceed with cetuximab with dose adjustment due to side effects I reinforced the importance of increase nutritional supplement as tolerated I will get advance home care to continue follow the patient at home and administer IV fluids as needed for supportive care

## 2016-09-11 NOTE — Assessment & Plan Note (Signed)
She has stable chronic renal failure stage III I recommend regular IV fluids at home through advance home care to prevent dehydration

## 2016-09-11 NOTE — Assessment & Plan Note (Signed)
She has grade 1 skin toxicity I recommend reducing the dose of cetuximab and starting her on clindamycin gel I will reassess next week If is not better, I will consider oral antibiotic treatment next week

## 2016-09-11 NOTE — Patient Instructions (Signed)
[facility] Discharge Instructions for Patients Receiving Chemotherapy  Today you received the following chemotherapy agents: Erbitux   To help prevent nausea and vomiting after your treatment, we encourage you to take your nausea medication as directed.    If you develop nausea and vomiting that is not controlled by your nausea medication, call the clinic.   BELOW ARE SYMPTOMS THAT SHOULD BE REPORTED IMMEDIATELY:  *FEVER GREATER THAN 100.5 F  *CHILLS WITH OR WITHOUT FEVER  NAUSEA AND VOMITING THAT IS NOT CONTROLLED WITH YOUR NAUSEA MEDICATION  *UNUSUAL SHORTNESS OF BREATH  *UNUSUAL BRUISING OR BLEEDING  TENDERNESS IN MOUTH AND THROAT WITH OR WITHOUT PRESENCE OF ULCERS  *URINARY PROBLEMS  *BOWEL PROBLEMS  UNUSUAL RASH Items with * indicate a potential emergency and should be followed up as soon as possible.  Feel free to call the clinic you have any questions or concerns. The clinic phone number is [phone].  Please show the CHEMO ALERT CARD at check-in to the Emergency Department and triage nurse.   

## 2016-09-18 ENCOUNTER — Encounter: Payer: Self-pay | Admitting: Hematology and Oncology

## 2016-09-18 ENCOUNTER — Ambulatory Visit (HOSPITAL_BASED_OUTPATIENT_CLINIC_OR_DEPARTMENT_OTHER): Payer: 59

## 2016-09-18 ENCOUNTER — Other Ambulatory Visit (HOSPITAL_BASED_OUTPATIENT_CLINIC_OR_DEPARTMENT_OTHER): Payer: 59

## 2016-09-18 ENCOUNTER — Ambulatory Visit: Payer: 59

## 2016-09-18 ENCOUNTER — Ambulatory Visit (HOSPITAL_BASED_OUTPATIENT_CLINIC_OR_DEPARTMENT_OTHER): Payer: 59 | Admitting: Hematology and Oncology

## 2016-09-18 ENCOUNTER — Encounter: Payer: Self-pay | Admitting: *Deleted

## 2016-09-18 VITALS — BP 100/72 | HR 105 | Temp 99.7°F | Resp 20

## 2016-09-18 DIAGNOSIS — G893 Neoplasm related pain (acute) (chronic): Secondary | ICD-10-CM

## 2016-09-18 DIAGNOSIS — C023 Malignant neoplasm of anterior two-thirds of tongue, part unspecified: Secondary | ICD-10-CM

## 2016-09-18 DIAGNOSIS — R110 Nausea: Secondary | ICD-10-CM

## 2016-09-18 DIAGNOSIS — Z5111 Encounter for antineoplastic chemotherapy: Secondary | ICD-10-CM | POA: Diagnosis not present

## 2016-09-18 DIAGNOSIS — D61818 Other pancytopenia: Secondary | ICD-10-CM | POA: Diagnosis not present

## 2016-09-18 DIAGNOSIS — T451X5A Adverse effect of antineoplastic and immunosuppressive drugs, initial encounter: Secondary | ICD-10-CM

## 2016-09-18 DIAGNOSIS — Z5112 Encounter for antineoplastic immunotherapy: Secondary | ICD-10-CM

## 2016-09-18 DIAGNOSIS — L270 Generalized skin eruption due to drugs and medicaments taken internally: Secondary | ICD-10-CM

## 2016-09-18 LAB — COMPREHENSIVE METABOLIC PANEL
ALBUMIN: 3.4 g/dL — AB (ref 3.5–5.0)
ALK PHOS: 94 U/L (ref 40–150)
ALT: 12 U/L (ref 0–55)
ANION GAP: 9 meq/L (ref 3–11)
AST: 19 U/L (ref 5–34)
BILIRUBIN TOTAL: 0.34 mg/dL (ref 0.20–1.20)
BUN: 18.4 mg/dL (ref 7.0–26.0)
CO2: 27 mEq/L (ref 22–29)
CREATININE: 1.3 mg/dL — AB (ref 0.6–1.1)
Calcium: 9.9 mg/dL (ref 8.4–10.4)
Chloride: 102 mEq/L (ref 98–109)
EGFR: 52 mL/min/{1.73_m2} — AB (ref >90)
Glucose: 118 mg/dl (ref 70–140)
Potassium: 3.7 mEq/L (ref 3.5–5.1)
Sodium: 139 mEq/L (ref 136–145)
Total Protein: 7.8 g/dL (ref 6.4–8.3)

## 2016-09-18 LAB — CBC WITH DIFFERENTIAL/PLATELET
BASO%: 0.4 % (ref 0.0–2.0)
BASOS ABS: 0 10*3/uL (ref 0.0–0.1)
EOS ABS: 0.2 10*3/uL (ref 0.0–0.5)
EOS%: 8.3 % — AB (ref 0.0–7.0)
HCT: 27.7 % — ABNORMAL LOW (ref 34.8–46.6)
HGB: 9.6 g/dL — ABNORMAL LOW (ref 11.6–15.9)
LYMPH%: 19.9 % (ref 14.0–49.7)
MCH: 29.3 pg (ref 25.1–34.0)
MCHC: 34.7 g/dL (ref 31.5–36.0)
MCV: 84.5 fL (ref 79.5–101.0)
MONO#: 0.3 10*3/uL (ref 0.1–0.9)
MONO%: 9.8 % (ref 0.0–14.0)
NEUT%: 61.6 % (ref 38.4–76.8)
NEUTROS ABS: 1.6 10*3/uL (ref 1.5–6.5)
Platelets: 160 10*3/uL (ref 145–400)
RBC: 3.28 10*6/uL — AB (ref 3.70–5.45)
RDW: 14.2 % (ref 11.2–14.5)
WBC: 2.7 10*3/uL — AB (ref 3.9–10.3)
lymph#: 0.5 10*3/uL — ABNORMAL LOW (ref 0.9–3.3)

## 2016-09-18 LAB — MAGNESIUM: Magnesium: 2.1 mg/dl (ref 1.5–2.5)

## 2016-09-18 MED ORDER — FAMOTIDINE IN NACL 20-0.9 MG/50ML-% IV SOLN
INTRAVENOUS | Status: AC
  Filled 2016-09-18: qty 50

## 2016-09-18 MED ORDER — CETUXIMAB CHEMO IV INJECTION 200 MG/100ML
200.0000 mg/m2 | Freq: Once | INTRAVENOUS | Status: AC
Start: 2016-09-18 — End: 2016-09-18
  Administered 2016-09-18: 400 mg via INTRAVENOUS
  Filled 2016-09-18: qty 200

## 2016-09-18 MED ORDER — PALONOSETRON HCL INJECTION 0.25 MG/5ML
INTRAVENOUS | Status: AC
  Filled 2016-09-18: qty 5

## 2016-09-18 MED ORDER — SODIUM CHLORIDE 0.9% FLUSH
10.0000 mL | INTRAVENOUS | Status: DC | PRN
Start: 2016-09-18 — End: 2016-09-18
  Filled 2016-09-18: qty 10

## 2016-09-18 MED ORDER — SODIUM CHLORIDE 0.9 % IV SOLN
7000.0000 mg | INTRAVENOUS | Status: DC
  Administered 2016-09-18: 7000 mg via INTRAVENOUS
  Filled 2016-09-18: qty 140

## 2016-09-18 MED ORDER — DEXAMETHASONE SODIUM PHOSPHATE 100 MG/10ML IJ SOLN: 10.0000 mg | Freq: Once | INTRAMUSCULAR | Status: DC

## 2016-09-18 MED ORDER — HYDROMORPHONE HCL 1 MG/ML PO LIQD: 6.0000 mg | ORAL | 0 refills | Status: DC | PRN

## 2016-09-18 MED ORDER — FAMOTIDINE IN NACL 20-0.9 MG/50ML-% IV SOLN
20.0000 mg | Freq: Two times a day (BID) | INTRAVENOUS | Status: DC
  Administered 2016-09-18: 20 mg via INTRAVENOUS

## 2016-09-18 MED ORDER — DEXAMETHASONE SODIUM PHOSPHATE 10 MG/ML IJ SOLN
10.0000 mg | Freq: Once | INTRAMUSCULAR | Status: AC
  Administered 2016-09-18: 10 mg via INTRAVENOUS

## 2016-09-18 MED ORDER — DEXAMETHASONE SODIUM PHOSPHATE 10 MG/ML IJ SOLN
INTRAMUSCULAR | Status: AC
  Filled 2016-09-18: qty 1

## 2016-09-18 MED ORDER — LEVOTHYROXINE SODIUM 75 MCG PO TABS: 75.0000 ug | ORAL_TABLET | Freq: Every day | ORAL | 9 refills | Status: AC

## 2016-09-18 MED ORDER — SODIUM CHLORIDE 0.9 % IV SOLN
373.5000 mg | Freq: Once | INTRAVENOUS | Status: AC
  Administered 2016-09-18: 370 mg via INTRAVENOUS
  Filled 2016-09-18: qty 37

## 2016-09-18 MED ORDER — DIPHENHYDRAMINE HCL 50 MG/ML IJ SOLN
50.0000 mg | Freq: Once | INTRAMUSCULAR | Status: AC
  Administered 2016-09-18: 50 mg via INTRAVENOUS

## 2016-09-18 MED ORDER — HEPARIN SOD (PORK) LOCK FLUSH 100 UNIT/ML IV SOLN
500.0000 [IU] | Freq: Once | INTRAVENOUS | Status: DC | PRN
  Filled 2016-09-18: qty 5

## 2016-09-18 MED ORDER — DIPHENHYDRAMINE HCL 50 MG/ML IJ SOLN
INTRAMUSCULAR | Status: AC
  Filled 2016-09-18: qty 1

## 2016-09-18 MED ORDER — SODIUM CHLORIDE 0.9% FLUSH
10.0000 mL | Freq: Once | INTRAVENOUS | Status: AC
  Administered 2016-09-18: 10 mL
  Filled 2016-09-18: qty 10

## 2016-09-18 MED ORDER — PALONOSETRON HCL INJECTION 0.25 MG/5ML
0.2500 mg | Freq: Once | INTRAVENOUS | Status: AC
  Administered 2016-09-18: 0.25 mg via INTRAVENOUS

## 2016-09-18 MED ORDER — SODIUM CHLORIDE 0.9 % IV SOLN
Freq: Once | INTRAVENOUS | Status: AC
  Administered 2016-09-18: 10:00:00 via INTRAVENOUS

## 2016-09-18 MED FILL — HYDROMORPHONE 5 MG/5 ML SOL: 1 | 9 days supply | Qty: 473 | Fill #0

## 2016-09-18 NOTE — Progress Notes (Signed)
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 Tongue, biopsy, Anterior deep margin left SQUAMOUS CELL CARCINOMA 3. Tongue, biopsy, Posterior deep margin left NEGATIVE FOR CARCINOMA 4. Tongue, biopsy, Posterior margin left SQUAMOUS CELL CARCINOMA 5. Tongue, excisional biopsy, New anterior deep margin NEGATIVE FOR CARCINOMA 6. Tongue, excisional biopsy, New posterior deep margin SMALL FOCUS OF SQUAMOUS CELL CARCINOMA (0.1 CM, ONLY PRESENT AT THE DEEPER PERMINENT SECTION) 7. Tongue, resection for tumor, Left lateral INFILTRATIVE KERATINIZED SQUAMOUS CELL CARCINOMA (3.2 CM) THE TUMOR INVADES UNDERNEATH MUSCLE OF THE TONGUE (2.0 CM, PT2) LYMPHOVASCULAR AND PERINEURAL INVASION IDENTIFIED SQUAMOUS CELL CARCINOMA PRESENTED AT (FINAL MARGINS REFER TO PART 2-6) LATERAL INKED MARGIN (1.0 CM) MEDIAL INKED MARGIN (0.6 CM)      02/08/2016 Surgery    SURGICAL PROCEDURES: 1. Left hemiglossectomy with primary closure. 2. Left selective neck dissection (zones I-III).      03/02/2016 Imaging    CT chest with contrast: No evidence of metastatic disease in the chest. 2. Aortic atherosclerosis.       03/02/2016 Imaging    CT neck with contrast showed : Newly enlarged and round 10 mm  right level 1b lymph node (series 5, image 45). The level 1A nodes remain normal. This right 1 B node is nonspecific but suspicious for progressive nodal metastatic disease. It might be amenable to Ultrasound-guided FNA. 2. Interval left hemiglossectomy and selective left neck dissection with confluent indeterminate soft tissue from the anterior left submandibular space through the left carotid space and obscuring the left level 1B, level 2, and level IIIa nodal stations. This study will serve as a new postoperative baseline of this area. Attention directed on close interval follow-up. 3. Mild soft tissue thickening also along the left lateral tongue resection margin. Attention directed on followup. 4. Chest CT today reported separately      03/14/2016 Procedure    She has normal baseline hearing test      03/15/2016 Procedure    1. Successful placement of a right internal jugular approach power injectable Port-A-Cath. The Port a catheter is ready for immediate use. 2. Successful fluoroscopic insertion of a 20-French pull-through gastrostomy tube. The gastrostomy may be used immediately for medication administration and may be utilized in 24 hrs for the initiation of feeds.      03/15/2016 - 04/30/2016 Radiation Therapy    Tongue and Bilateral Neck treated to 68 Gy in 33 fractions      03/16/2016 - 04/27/2016 Chemotherapy    She received weekly cisplatin. Cycle 2-6 reduced dose at 20 mg/m2 due to significant side-effects and poor tolerance       04/04/2016 Adverse Reaction    Chemotherapy is placed on hold due to acute renal failure      04/13/2016 Adverse Reaction    Chemo was placed on hold due to shingles outbreak and paronychia  05/29/2016 - 06/04/2016 Hospital Admission    She was admitted to the hospital for management of nausea, vomiting and inability to tolerate tube feeding      06/06/2016 - 06/13/2016 Hospital Admission    She was admitted to the hospital for management of nausea,  vomiting and inability to tolerate tube feeding      08/09/2016 Imaging    1. New bilateral upper lobe ground-glass nodules. Favor infectious or inflammatory process. Recommend close attention on routine follow-up. 2. Angular nodule RIGHT upper lobe is also favored infectious or inflammatory. Recommend attention on follow-up as above. 3. No mediastinal or supraclavicular lymphadenopathy. 4. Small RIGHT thyroid nodule does not meet biopsy criteria. Please see separate CT neck dictation.      08/09/2016 Imaging    1. Progression of disease with new/markedly worsened mass of the left base of tongue measuring 2.9 x 2.2 x 2.3 centimeters. Persistent soft tissue thickening of the postoperative left neck extending from the angle of the mandible to the level of the hyoid bone. 2. Confluent, centrally necrotic mass in the right submental region, at the site of previously seen enlarged level 1B lymph node. This mass extends into the floor of the mouth and displaces or invades the right sublingual gland and the right mylohyoid and hyoglossus muscles. 3. Centrally necrotic right level IIa lymph node is new from the prior study and consistent with metastatic spread. No other lower cervical lymphadenopathy. 4. Multiple ground-glass opacities in the lung apices. Please see dedicated report for concomitant chest CT for more complete characterization. 5. Thickening of the epiglottis and aryepiglottic folds is likely secondary to prior radiation treatment. No acute airway compromise.      08/22/2016 PET scan    1. The recently described progressive cervical disease is markedly hypermetabolic. This includes local recurrence in the left base of tongue and a confluent right submental nodal mass. In addition, there are small hypermetabolic lymph nodes on the right in station IIb and the anterior to the lower trachea.  2. No typical metastatic disease within the chest, abdomen or pelvis. 3. The recently described  ground-glass opacities in both upper lobes have enlarged, become more dense and are associated with moderate hypermetabolic activity. These are new from February and probably inflammatory/infectious. CT follow-up after appropriate therapy recommended.      08/27/2016 -  Chemotherapy    She received palliative chemo with carboplatin/5FU and cetuximab      09/11/2016 Adverse Reaction    Dose of cetuximab is reduced due to skin toxicity       INTERVAL HISTORY: Please see below for problem oriented charting. She returns for further follow-up She still have some skin itching but stable/mildly improved with conservative management She has some recent nausea but no vomiting She has postprandial satiety IV fluids had helped She denies recent constipation The patient denies any recent signs or symptoms of bleeding such as spontaneous epistaxis, hematuria or hematochezia. No recent fevers or chills  REVIEW OF SYSTEMS:   Constitutional: Denies fevers, chills or abnormal weight loss Eyes: Denies blurriness of vision Respiratory: Denies cough, dyspnea or wheezes Cardiovascular: Denies palpitation, chest discomfort or lower extremity swelling Lymphatics: Denies new lymphadenopathy or easy bruising Neurological:Denies numbness, tingling or new weaknesses Behavioral/Psych: Mood is stable, no new changes  All other systems were reviewed with the patient and are negative.  I have reviewed the past medical history, past surgical history, social history and family history with the patient and they are unchanged from previous note.  ALLERGIES:  has No Known Allergies.  MEDICATIONS:  Current Outpatient Prescriptions  Medication Sig Dispense Refill  . Amino Acids-Protein Hydrolys (FEEDING SUPPLEMENT, PRO-STAT SUGAR FREE 64,) LIQD Place 30 mLs into feeding tube daily. 900 mL 0  . chlorhexidine (PERIDEX) 0.12 % solution Rinse with 15 mls three times daily for 30 seconds. Use after breakfast, dinner,  and at bedtime. Spit out excess. Do not swallow. 480 mL prn  . clindamycin (CLINDAGEL) 1 % gel Apply topically 2 (two) times daily. 30 g 0  . HYDROmorphone HCl (DILAUDID) 1 MG/ML LIQD Place 6 mLs (6 mg total) into feeding tube every 3 (three) hours as needed for moderate pain or severe pain. 473 mL 0  . levothyroxine (SYNTHROID, LEVOTHROID) 75 MCG tablet Take 1 tablet (75 mcg total) by mouth daily before breakfast. 30 tablet 9  . lidocaine-prilocaine (EMLA) cream Apply to affected area once 30 g 3  . LORazepam (ATIVAN) 1 MG tablet TAKE 1 TABLET BY MOUTH EVERY 8 HOURS AS NEEDED FOR ANXIETY OR SLEEP 60 tablet 0  . mirtazapine (REMERON SOL-TAB) 15 MG disintegrating tablet Take 1 tablet (15 mg total) by mouth at bedtime. 30 tablet 9  . Nutritional Supplements (FEEDING SUPPLEMENT, VITAL 1.5 CAL,) LIQD D/C continuous feeding pump. Give Vital 1.5 or equivalent, 7 bottles daily, bolus feeding via PEG. Flush tube with 60 mL free water before and after bolus feeding QID. In addition, flush tube with 250 mL free water QID. Send formula and syringes. Pick up pump. 7 Can 4  . OLANZapine (ZYPREXA) 5 MG tablet Place 1 tablet (5 mg total) into feeding tube at bedtime. (Patient not taking: Reported on 08/10/2016) 30 tablet 9  . ondansetron (ZOFRAN ODT) 8 MG disintegrating tablet Take 1 tablet (8 mg total) by mouth every 8 (eight) hours as needed for nausea or vomiting. (Patient not taking: Reported on 08/10/2016) 30 tablet 5  . ondansetron (ZOFRAN) 8 MG tablet Take 1 tablet (8 mg total) by mouth 2 (two) times daily as needed for refractory nausea / vomiting. Start on day 3 after chemo. 30 tablet 1  . polyethylene glycol (MIRALAX / GLYCOLAX) packet Place 17 g into feeding tube daily. 14 each 0  . prochlorperazine (COMPAZINE) 10 MG tablet TAKE 1 TABLET BY MOUTH EVERY 6 HOURS AS NEEDED FOR NAUSEA AND VOMITING (Patient not taking: Reported on 08/10/2016) 30 tablet 1  . prochlorperazine (COMPAZINE) 10 MG tablet Take 1 tablet  (10 mg total) by mouth every 6 (six) hours as needed (Nausea or vomiting). 30 tablet 1  . senna (SENOKOT) 8.6 MG tablet Take 1 tablet by mouth 2 (two) times daily.     No current facility-administered medications for this visit.    Facility-Administered Medications Ordered in Other Visits  Medication Dose Route Frequency Provider Last Rate Last Dose  . famotidine (PEPCID) IVPB 20 mg premix  20 mg Intravenous Q12H Jariel Drost, MD   20 mg at 09/18/16 1003  . fluorouracil (ADRUCIL) 7,000 mg in sodium chloride 0.9 % 110 mL chemo infusion  7,000 mg Intravenous 4 days Alvy Bimler, Kouper Spinella, MD   7,000 mg at 09/18/16 1322  . heparin lock flush 100 unit/mL  500 Units Intracatheter Once PRN Alvy Bimler, Jahfari Ambers, MD      . heparin lock flush 100 unit/mL  500 Units Intracatheter Once PRN Alvy Bimler, Lemoyne Scarpati, MD      . sodium chloride flush (NS) 0.9 % injection 10 mL  10 mL Intracatheter PRN [name], MD      .  sodium chloride flush (NS) 0.9 % injection 10 mL  10 mL Intracatheter PRN Ayodele Sangalang, MD        PHYSICAL EXAMINATION: ECOG PERFORMANCE STATUS: 2 - Symptomatic, <50% confined to bed GENERAL:alert, no distress and comfortable SKIN: Her skin rash is improving  EYES: normal, Conjunctiva are pink and non-injected, sclera clear Musculoskeletal:no cyanosis of digits and no clubbing  NEURO: alert & oriented x 3 with fluent speech, no focal motor/sensory deficits  LABORATORY DATA:  I have reviewed the data as listed    Component Value Date/Time   NA 139 09/18/2016 0849   K 3.7 09/18/2016 0849   CL 101 06/12/2016 0540   CO2 27 09/18/2016 0849   GLUCOSE 118 09/18/2016 0849   BUN 18.4 09/18/2016 0849   CREATININE 1.3 (H) 09/18/2016 0849   CALCIUM 9.9 09/18/2016 0849   PROT 7.8 09/18/2016 0849   ALBUMIN 3.4 (L) 09/18/2016 0849   AST 19 09/18/2016 0849   ALT 12 09/18/2016 0849   ALKPHOS 94 09/18/2016 0849   BILITOT 0.34 09/18/2016 0849   GFRNONAA 56 (L) 06/12/2016 0540   GFRAA >60 06/12/2016 0540    No results  found for: SPEP, UPEP  Lab Results  Component Value Date   WBC 2.7 (L) 09/18/2016   NEUTROABS 1.6 09/18/2016   HGB 9.6 (L) 09/18/2016   HCT 27.7 (L) 09/18/2016   MCV 84.5 09/18/2016   PLT 160 09/18/2016      Chemistry      Component Value Date/Time   NA 139 09/18/2016 0849   K 3.7 09/18/2016 0849   CL 101 06/12/2016 0540   CO2 27 09/18/2016 0849   BUN 18.4 09/18/2016 0849   CREATININE 1.3 (H) 09/18/2016 0849      Component Value Date/Time   CALCIUM 9.9 09/18/2016 0849   ALKPHOS 94 09/18/2016 0849   AST 19 09/18/2016 0849   ALT 12 09/18/2016 0849   BILITOT 0.34 09/18/2016 0849       RADIOGRAPHIC STUDIES: I have personally reviewed the radiological images as listed and agreed with the findings in the report. Nm Pet Image Initial (pi) Skull Base To Thigh  Result Date: 08/22/2016 CLINICAL DATA:  Subsequent treatment strategy for squamous cell carcinoma of the left aspect of the tongue. Surgery 6 months ago with subsequent chemotherapy and radiation therapy. EXAM: NUCLEAR MEDICINE PET SKULL BASE TO THIGH TECHNIQUE: 13.178 mCi F-18 FDG was injected intravenously. Full-ring PET imaging was performed from the skull base to thigh after the radiotracer. CT data was obtained and used for attenuation correction and anatomic localization. FASTING BLOOD GLUCOSE:  Value: 90 mg/dl COMPARISON:  CT of the neck and chest 08/09/2016. FINDINGS: NECK Extensive tumor demonstrated on the recent neck CT is markedly hypermetabolic. The individual masses are better defined on the previous CT performed with contrast, although have not grossly changed. The infiltrating right submental lesion measures approximately 5.7 x 3.1 cm and demonstrates homogeneous hypermetabolic activity with an SUV max of 13.3. The tumor at the left tongue base measures approximately 3.7 x 2.9 cm on image 35 and has an SUV max of 12.0.9 mm level IIa node on the right (image 39) is also hypermetabolic with an SUV max of 5.6. There is  also focal hypermetabolic activity anterior to the trachea along the inferior aspect of the right thyroid cartilage. This corresponds with a small soft tissue nodule and has an SUV max of 7.2. Activity along the arytenoid cartilage is symmetric and likely physiologic. No abnormal activity within the  left thyroid nodule. CHEST There are no hypermetabolic mediastinal, hilar or axillary lymph nodes. There are enlarging and increasingly dense focal ground-glass opacities in both upper lobes. These are moderately hypermetabolic. 2.4 cm right upper lobe component on image 56 has an SUV max of 4.7. 2.0 cm left upper lobe component on image 55 has an SUV max of 5.3. There are other smaller components in both upper lobes. No definite lower lobe involvement or solid pulmonary nodule. ABDOMEN/PELVIS There is no hypermetabolic activity within the liver, adrenal glands, spleen or pancreas. There is no hypermetabolic nodal activity. Percutaneous G-tube noted. SKELETON There is no hypermetabolic activity to suggest osseous metastatic disease. IMPRESSION: 1. The recently described progressive cervical disease is markedly hypermetabolic. This includes local recurrence in the left base of tongue and a confluent right submental nodal mass. In addition, there are small hypermetabolic lymph nodes on the right in station IIb and the anterior to the lower trachea. 2. No typical metastatic disease within the chest, abdomen or pelvis. 3. The recently described ground-glass opacities in both upper lobes have enlarged, become more dense and are associated with moderate hypermetabolic activity. These are new from February and probably inflammatory/infectious. CT follow-up after appropriate therapy recommended. Electronically Signed   By: Richardean Sale M.D.   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[place]) She has some side effects from prior treatment with pancytopenia, mucositis, skin rash and  others We will proceed with cycle 2 of treatment with minor dose adjustment I will get advance home care to continue follow the patient at home and administer IV fluids as needed for supportive care  Pancytopenia, acquired [facility]) She has acquired pancytopenia, not unexpected given recent chemotherapy She does not need G-CSF support for now The patient declined transfusion support for religious reasons Will monitor closely  Acneiform drug eruption She has grade 1 skin toxicity I recommend reducing the dose of cetuximab and starting her on clindamycin gel It is stable  Cancer associated pain The patient does not like sedated side effects from fentanyl I recommend increasing Dilaudid dose a little bit to 6 mg as needed I will continue to reassess pain control in her next visit  Chemotherapy-induced nausea She has recent chemotherapy induced nausea She also have early satiety and postprandial nausea We will adjust her nutritional feeding to 1-1/2 cans 4 times a day I also recommend continue IV fluid support at home through advance home care and she agreed   No orders of the defined types were placed in this encounter.  All questions were answered. The patient knows to call the clinic with any problems, questions or concerns. No barriers to learning was detected. I spent 20 minutes counseling the patient face to face. The total time spent in the appointment was 30 minutes and more than 50% was on counseling and review of test results     [name], MD 09/18/2016 4:14 PM

## 2016-09-18 NOTE — Assessment & Plan Note (Signed)
The patient does not like sedated side effects from fentanyl I recommend increasing Dilaudid dose a little bit to 6 mg as needed I will continue to reassess pain control in her next visit

## 2016-09-18 NOTE — Assessment & Plan Note (Signed)
She has some side effects from prior treatment with pancytopenia, mucositis, skin rash and others We will proceed with cycle 2 of treatment with minor dose adjustment I will get advance home care to continue follow the patient at home and administer IV fluids as needed for supportive care

## 2016-09-18 NOTE — Progress Notes (Signed)
Nutrition Follow-up:  Nutrition follow-up completed in infusion this am.  Patient with tongue cancer.    Patient reports that she is taking tube feeding via bolus now.  Reports she does not like the continuous pump.  Reports that she is using 5 cans of the vital 1.5 via tube and taking 1-2 ensures via tube per day.  Also providing water flush.    Reports biggest issue recently has been nausea.  Confirms that she is taking nausea medication.  Reports having regular bowel movement 1 time daily.    Medications: reviewed  Labs: reviewed  Anthropometrics:   Weight noted at 175 lb 9.6 oz on 9/11 decreased from 180 lb on 8/23.   Estimated Energy Needs  Kcals: 1655-3748 calories/d Protein: 120-135 g/d Fluid: 2.5 L/d  NUTRITION DIAGNOSIS: Unintentional weight loss continues   MALNUTRITION DIAGNOSIS: Severe malnutrition continues   INTERVENTION:   Son reports issues with feeding tube/connector.  Spoke with Liliane Channel, RN and will follow-up with patient concerns later today. Encouraged patient to continue to provide 7 cans of tube feeding per day to better meet nutritional needs and maintain weight.  Encouraged patient to continue to utilize nausea medication.    MONITORING, EVALUATION, GOAL: Patient will tolerate tube feeding to meet greater than 90% of estimated nutrition needs   NEXT VISIT: Tuesday, September 25 during infusion  Madysyn Hanken B. Zenia Resides, [place], [place] Registered Dietitian [phone] (pager)

## 2016-09-18 NOTE — Assessment & Plan Note (Signed)
She has recent chemotherapy induced nausea She also have early satiety and postprandial nausea We will adjust her nutritional feeding to 1-1/2 cans 4 times a day I also recommend continue IV fluid support at home through advance home care and she agreed

## 2016-09-18 NOTE — Progress Notes (Addendum)
Oncology Nurse Navigator Documentation  In follow-up to call from [place], met with Ms. Embree in Infusion to evaluate PEG.  She stated single event yesterday when red cap on Lopez valve dislodged during bolus instillation of nutritional supplement when gentle pressure applied with syringe plunger.  She indicated has not happened again but husband wanted valve to be replaced to preempt future complication.  Upon examination, Lopez valve integrity intact, stop-cock adjusted correctly.  Adapter into which Eugenio Hoes secured intact, no indications integrity challenged, red cap appropriately secure.  I reviewed correct positioning of stop-cock when conducting bolus feedings, she noted incorrect positioning likely the problem.  She noted she is no longer using pump for continuous feeding which is why Eugenio Hoes valve initially attached.  I suggested rather than replacing Eugenio Hoes, it be removed, new adapter attached to PEG tubing to insure secure insertion of syringes.  If continuous feeding needed in future, a new Lopez valve could be attached.  She agreed with plan, understands I will follow-up with her during near future visit and replace adapter.  [name], RN, BSN, [facility] Oncology Nurse [place] [facility] [phone]

## 2016-09-18 NOTE — Patient Instructions (Signed)
[facility] Discharge Instructions for Patients Receiving Chemotherapy  Today you received the following chemotherapy agents Erbitux, Carboplatin,and  5FU  To help prevent nausea and vomiting after your treatment, we encourage you to take your nausea medication as directed   If you develop nausea and vomiting that is not controlled by your nausea medication, call the clinic.   BELOW ARE SYMPTOMS THAT SHOULD BE REPORTED IMMEDIATELY:  *FEVER GREATER THAN 100.5 F  *CHILLS WITH OR WITHOUT FEVER  NAUSEA AND VOMITING THAT IS NOT CONTROLLED WITH YOUR NAUSEA MEDICATION  *UNUSUAL SHORTNESS OF BREATH  *UNUSUAL BRUISING OR BLEEDING  TENDERNESS IN MOUTH AND THROAT WITH OR WITHOUT PRESENCE OF ULCERS  *URINARY PROBLEMS  *BOWEL PROBLEMS  UNUSUAL RASH Items with * indicate a potential emergency and should be followed up as soon as possible.  Feel free to call the clinic you have any questions or concerns. The clinic phone number is [phone].  Please show the [facility] at check-in to the Emergency Department and triage nurse.

## 2016-09-18 NOTE — Assessment & Plan Note (Signed)
She has grade 1 skin toxicity I recommend reducing the dose of cetuximab and starting her on clindamycin gel It is stable

## 2016-09-18 NOTE — Assessment & Plan Note (Signed)
She has acquired pancytopenia, not unexpected given recent chemotherapy She does not need G-CSF support for now The patient declined transfusion support for religious reasons Will monitor closely

## 2016-09-19 ENCOUNTER — Telehealth: Payer: Self-pay

## 2016-09-19 ENCOUNTER — Telehealth: Payer: Self-pay | Admitting: Hematology and Oncology

## 2016-09-19 NOTE — Telephone Encounter (Signed)
Nurse with [facility] called. Patient had 5FU pump infusing, huber needle has only one port and chemo is infusing thru this. Patient is not wanting a peripheral IV for IV fluids.   Dr. Alvy Bimler informed of above. Scheduling message sent to add patient for IV fluids and huber change tomorrow.

## 2016-09-19 NOTE — Telephone Encounter (Signed)
Per  9/18 los - Return for No new orders or return visit

## 2016-09-20 ENCOUNTER — Ambulatory Visit (HOSPITAL_BASED_OUTPATIENT_CLINIC_OR_DEPARTMENT_OTHER): Payer: 59

## 2016-09-20 ENCOUNTER — Telehealth: Payer: Self-pay

## 2016-09-20 VITALS — BP 112/62 | HR 96 | Temp 99.2°F | Resp 20

## 2016-09-20 DIAGNOSIS — C023 Malignant neoplasm of anterior two-thirds of tongue, part unspecified: Secondary | ICD-10-CM

## 2016-09-20 DIAGNOSIS — R110 Nausea: Secondary | ICD-10-CM | POA: Diagnosis not present

## 2016-09-20 MED ORDER — SODIUM CHLORIDE 0.9% FLUSH
10.0000 mL | INTRAVENOUS | Status: DC | PRN
  Administered 2016-09-20: 10 mL
  Filled 2016-09-20: qty 10

## 2016-09-20 MED ORDER — SODIUM CHLORIDE 0.9 % IV SOLN
Freq: Once | INTRAVENOUS | Status: AC
  Administered 2016-09-20: 14:00:00 via INTRAVENOUS

## 2016-09-20 NOTE — Telephone Encounter (Signed)
Pt called for appt for fluids and huber needle change as per phone note yesterday. S/w infusion room and she can come in at 215 for 230 appt.  Called pt back with appt time. She was agreeable.  inbasket sent for scheduling the time.

## 2016-09-20 NOTE — Patient Instructions (Signed)
Dehydration, Adult Dehydration is a condition in which there is not enough fluid or water in the body. This happens when you lose more fluids than you take in. Important organs, such as the kidneys, brain, and heart, cannot function without a proper amount of fluids. Any loss of fluids from the body can lead to dehydration. Dehydration can range from mild to severe. This condition should be treated right away to prevent it from becoming severe. What are the causes? This condition may be caused by:  Vomiting.  Diarrhea.  Excessive sweating, such as from heat exposure or exercise.  Not drinking enough fluid, especially: ? When ill. ? While doing activity that requires a lot of energy.  Excessive urination.  Fever.  Infection.  Certain medicines, such as medicines that cause the body to lose excess fluid (diuretics).  Inability to access safe drinking water.  Reduced physical ability to get adequate water and food.  What increases the risk? This condition is more likely to develop in people:  Who have a poorly controlled long-term (chronic) illness, such as diabetes, heart disease, or kidney disease.  Who are age 65 or older.  Who are disabled.  Who live in a place with high altitude.  Who play endurance sports.  What are the signs or symptoms? Symptoms of mild dehydration may include:  Thirst.  Dry lips.  Slightly dry mouth.  Dry, warm skin.  Dizziness. Symptoms of moderate dehydration may include:  Very dry mouth.  Muscle cramps.  Dark urine. Urine may be the color of tea.  Decreased urine production.  Decreased tear production.  Heartbeat that is irregular or faster than normal (palpitations).  Headache.  Light-headedness, especially when you stand up from a sitting position.  Fainting (syncope). Symptoms of severe dehydration may include:  Changes in skin, such as: ? Cold and clammy skin. ? Blotchy (mottled) or pale skin. ? Skin that does  not quickly return to normal after being lightly pinched and released (poor skin turgor).  Changes in body fluids, such as: ? Extreme thirst. ? No tear production. ? Inability to sweat when body temperature is high, such as in hot weather. ? Very little urine production.  Changes in vital signs, such as: ? Weak pulse. ? Pulse that is more than 100 beats a minute when sitting still. ? Rapid breathing. ? Low blood pressure.  Other changes, such as: ? Sunken eyes. ? Cold hands and feet. ? Confusion. ? Lack of energy (lethargy). ? Difficulty waking up from sleep. ? Short-term weight loss. ? Unconsciousness. How is this diagnosed? This condition is diagnosed based on your symptoms and a physical exam. Blood and urine tests may be done to help confirm the diagnosis. How is this treated? Treatment for this condition depends on the severity. Mild or moderate dehydration can often be treated at home. Treatment should be started right away. Do not wait until dehydration becomes severe. Severe dehydration is an emergency and it needs to be treated in a hospital. Treatment for mild dehydration may include:  Drinking more fluids.  Replacing salts and minerals in your blood (electrolytes) that you may have lost. Treatment for moderate dehydration may include:  Drinking an oral rehydration solution (ORS). This is a drink that helps you replace fluids and electrolytes (rehydrate). It can be found at pharmacies and retail stores. Treatment for severe dehydration may include:  Receiving fluids through an IV tube.  Receiving an electrolyte solution through a feeding tube that is passed through your nose   and into your stomach (nasogastric tube, or NG tube).  Correcting any abnormalities in electrolytes.  Treating the underlying cause of dehydration. Follow these instructions at home:  If directed by your health care provider, drink an ORS: ? Make an ORS by following instructions on the  package. ? Start by drinking small amounts, about  cup (120 mL) every 5-10 minutes. ? Slowly increase how much you drink until you have taken the amount recommended by your health care provider.  Drink enough clear fluid to keep your urine clear or pale yellow. If you were told to drink an ORS, finish the ORS first, then start slowly drinking other clear fluids. Drink fluids such as: ? Water. Do not drink only water. Doing that can lead to having too little salt (sodium) in the body (hyponatremia). ? Ice chips. ? Fruit juice that you have added water to (diluted fruit juice). ? Low-calorie sports drinks.  Avoid: ? Alcohol. ? Drinks that contain a lot of sugar. These include high-calorie sports drinks, fruit juice that is not diluted, and soda. ? Caffeine. ? Foods that are greasy or contain a lot of fat or sugar.  Take over-the-counter and prescription medicines only as told by your health care provider.  Do not take sodium tablets. This can lead to having too much sodium in the body (hypernatremia).  Eat foods that contain a healthy balance of electrolytes, such as bananas, oranges, potatoes, tomatoes, and spinach.  Keep all follow-up visits as told by your health care provider. This is important. Contact a health care provider if:  You have abdominal pain that: ? Gets worse. ? Stays in one area (localizes).  You have a rash.  You have a stiff neck.  You are more irritable than usual.  You are sleepier or more difficult to wake up than usual.  You feel weak or dizzy.  You feel very thirsty.  You have urinated only a small amount of very dark urine over 6-8 hours. Get help right away if:  You have symptoms of severe dehydration.  You cannot drink fluids without vomiting.  Your symptoms get worse with treatment.  You have a fever.  You have a severe headache.  You have vomiting or diarrhea that: ? Gets worse. ? Does not go away.  You have blood or green matter  (bile) in your vomit.  You have blood in your stool. This may cause stool to look black and tarry.  You have not urinated in 6-8 hours.  You faint.  Your heart rate while sitting still is over 100 beats a minute.  You have trouble breathing. This information is not intended to replace advice given to you by your health care provider. Make sure you discuss any questions you have with your health care provider. Document Released: 12/18/2004 Document Revised: 07/15/2015 Document Reviewed: 02/11/2015 Elsevier Interactive Patient Education  2018 Elsevier Inc.  

## 2016-09-23 MED FILL — LIDOCAINE-PRILOCAINE CREAM: 2.5-2.5 | 10 days supply | Qty: 30 | Fill #3

## 2016-09-24 ENCOUNTER — Other Ambulatory Visit: Payer: Self-pay | Admitting: Hematology and Oncology

## 2016-09-24 DIAGNOSIS — C023 Malignant neoplasm of anterior two-thirds of tongue, part unspecified: Secondary | ICD-10-CM

## 2016-09-24 MED FILL — ONDANSETRON HCL 8 MG TAB: 8 | 15 days supply | Qty: 30 | Fill #0

## 2016-09-24 MED FILL — PROCHLORPERAZINE 10 MG TAB: 10 | 7 days supply | Qty: 30 | Fill #1

## 2016-09-24 MED FILL — LORazepam 1 MG TABS: 1 | 20 days supply | Qty: 60 | Fill #0

## 2016-09-25 ENCOUNTER — Ambulatory Visit: Payer: 59

## 2016-09-25 ENCOUNTER — Other Ambulatory Visit (HOSPITAL_BASED_OUTPATIENT_CLINIC_OR_DEPARTMENT_OTHER): Payer: 59

## 2016-09-25 ENCOUNTER — Ambulatory Visit (HOSPITAL_BASED_OUTPATIENT_CLINIC_OR_DEPARTMENT_OTHER): Payer: 59

## 2016-09-25 ENCOUNTER — Encounter: Payer: Self-pay | Admitting: *Deleted

## 2016-09-25 VITALS — BP 112/69 | HR 100 | Temp 99.9°F | Resp 16

## 2016-09-25 DIAGNOSIS — Z5112 Encounter for antineoplastic immunotherapy: Secondary | ICD-10-CM

## 2016-09-25 DIAGNOSIS — C023 Malignant neoplasm of anterior two-thirds of tongue, part unspecified: Secondary | ICD-10-CM

## 2016-09-25 LAB — COMPREHENSIVE METABOLIC PANEL
ALT: 24 U/L (ref 0–55)
ANION GAP: 5 meq/L (ref 3–11)
AST: 36 U/L — AB (ref 5–34)
Albumin: 3.4 g/dL — ABNORMAL LOW (ref 3.5–5.0)
Alkaline Phosphatase: 93 U/L (ref 40–150)
BILIRUBIN TOTAL: 0.43 mg/dL (ref 0.20–1.20)
BUN: 25.1 mg/dL (ref 7.0–26.0)
CALCIUM: 9.4 mg/dL (ref 8.4–10.4)
CO2: 28 mEq/L (ref 22–29)
CREATININE: 1.1 mg/dL (ref 0.6–1.1)
Chloride: 104 mEq/L (ref 98–109)
EGFR: 60 mL/min/{1.73_m2} — ABNORMAL LOW (ref >90)
Glucose: 93 mg/dl (ref 70–140)
Potassium: 3.8 mEq/L (ref 3.5–5.1)
Sodium: 137 mEq/L (ref 136–145)
TOTAL PROTEIN: 7.7 g/dL (ref 6.4–8.3)

## 2016-09-25 LAB — CBC WITH DIFFERENTIAL/PLATELET
BASO%: 0.5 % (ref 0.0–2.0)
Basophils Absolute: 0 10*3/uL (ref 0.0–0.1)
EOS ABS: 0.1 10*3/uL (ref 0.0–0.5)
EOS%: 2.4 % (ref 0.0–7.0)
HCT: 27.6 % — ABNORMAL LOW (ref 34.8–46.6)
HGB: 9.6 g/dL — ABNORMAL LOW (ref 11.6–15.9)
LYMPH%: 10.5 % — AB (ref 14.0–49.7)
MCH: 29.7 pg (ref 25.1–34.0)
MCHC: 34.8 g/dL (ref 31.5–36.0)
MCV: 85.5 fL (ref 79.5–101.0)
MONO#: 0.1 10*3/uL (ref 0.1–0.9)
MONO%: 4.1 % (ref 0.0–14.0)
NEUT#: 2.6 10*3/uL (ref 1.5–6.5)
NEUT%: 82.5 % — AB (ref 38.4–76.8)
PLATELETS: 343 10*3/uL (ref 145–400)
RBC: 3.23 10*6/uL — ABNORMAL LOW (ref 3.70–5.45)
RDW: 14.3 % (ref 11.2–14.5)
WBC: 3.2 10*3/uL — AB (ref 3.9–10.3)
lymph#: 0.3 10*3/uL — ABNORMAL LOW (ref 0.9–3.3)

## 2016-09-25 LAB — MAGNESIUM: MAGNESIUM: 1.9 mg/dL (ref 1.5–2.5)

## 2016-09-25 MED ORDER — SODIUM CHLORIDE 0.9% FLUSH
10.0000 mL | INTRAVENOUS | Status: DC | PRN
  Administered 2016-09-25: 10 mL
  Filled 2016-09-25: qty 10

## 2016-09-25 MED ORDER — HEPARIN SOD (PORK) LOCK FLUSH 100 UNIT/ML IV SOLN
500.0000 [IU] | Freq: Once | INTRAVENOUS | Status: AC | PRN
  Administered 2016-09-25: 500 [IU]
  Filled 2016-09-25: qty 5

## 2016-09-25 MED ORDER — DIPHENHYDRAMINE HCL 50 MG/ML IJ SOLN
50.0000 mg | Freq: Once | INTRAMUSCULAR | Status: AC
  Administered 2016-09-25: 50 mg via INTRAVENOUS

## 2016-09-25 MED ORDER — SODIUM CHLORIDE 0.9 % IV SOLN
Freq: Once | INTRAVENOUS | Status: AC
  Administered 2016-09-25: 14:00:00 via INTRAVENOUS

## 2016-09-25 MED ORDER — ONDANSETRON HCL 4 MG/2ML IJ SOLN
INTRAMUSCULAR | Status: AC
  Filled 2016-09-25: qty 2

## 2016-09-25 MED ORDER — FAMOTIDINE IN NACL 20-0.9 MG/50ML-% IV SOLN
INTRAVENOUS | Status: AC
  Filled 2016-09-25: qty 50

## 2016-09-25 MED ORDER — ONDANSETRON HCL 4 MG/2ML IJ SOLN
8.0000 mg | Freq: Once | INTRAMUSCULAR | Status: AC
  Administered 2016-09-25: 8 mg via INTRAVENOUS

## 2016-09-25 MED ORDER — FAMOTIDINE IN NACL 20-0.9 MG/50ML-% IV SOLN
20.0000 mg | Freq: Once | INTRAVENOUS | Status: AC
  Administered 2016-09-25: 20 mg via INTRAVENOUS

## 2016-09-25 MED ORDER — DIPHENHYDRAMINE HCL 50 MG/ML IJ SOLN
INTRAMUSCULAR | Status: AC
  Filled 2016-09-25: qty 1

## 2016-09-25 MED ORDER — SODIUM CHLORIDE 0.9% FLUSH
10.0000 mL | Freq: Once | INTRAVENOUS | Status: AC
  Administered 2016-09-25: 10 mL
  Filled 2016-09-25: qty 10

## 2016-09-25 MED ORDER — CETUXIMAB CHEMO IV INJECTION 200 MG/100ML
200.0000 mg/m2 | Freq: Once | INTRAVENOUS | Status: AC
  Administered 2016-09-25: 400 mg via INTRAVENOUS
  Filled 2016-09-25: qty 200

## 2016-09-25 NOTE — Progress Notes (Signed)
OK to treat with AST  36 today as per Dr. Alvy Bimler.

## 2016-09-25 NOTE — Patient Instructions (Addendum)
Cetuximab injection What is this medicine? CETUXIMAB (se TUX i mab) is a monoclonal antibody. It is used to treat colorectal cancer and head and neck cancer. This medicine may be used for other purposes; ask your health care provider or pharmacist if you have questions. COMMON BRAND NAME(S): Erbitux What should I tell my health care provider before I take this medicine? They need to know if you have any of these conditions: -heart disease -history of irregular heartbeat -history of low levels of calcium, magnesium, or potassium in the blood -lung or breathing disease, like asthma -an unusual or allergic reaction to cetuximab, other medicines, foods, dyes, or preservatives -pregnant or trying to get pregnant -breast-feeding How should I use this medicine? This drug is given as an infusion into a vein. It is administered in a hospital or clinic by a specially trained health care professional. Talk to your pediatrician regarding the use of this medicine in children. Special care may be needed. Overdosage: If you think you have taken too much of this medicine contact a poison control center or emergency room at once. NOTE: This medicine is only for you. Do not share this medicine with others. What if I miss a dose? It is important not to miss your dose. Call your doctor or health care professional if you are unable to keep an appointment. What may interact with this medicine? Interactions are not expected. This list may not describe all possible interactions. Give your health care provider a list of all the medicines, herbs, non-prescription drugs, or dietary supplements you use. Also tell them if you smoke, drink alcohol, or use illegal drugs. Some items may interact with your medicine. What should I watch for while using this medicine? Visit your doctor or health care professional for regular checks on your progress. This drug may make you feel generally unwell. This is not uncommon, as  chemotherapy can affect healthy cells as well as cancer cells. Report any side effects. Continue your course of treatment even though you feel ill unless your doctor tells you to stop. This medicine can make you more sensitive to the sun. Keep out of the sun while taking this medicine and for 2 months after the last dose. If you cannot avoid being in the sun, wear protective clothing and use sunscreen. Do not use sun lamps or tanning beds/booths. You may need blood work done while you are taking this medicine. In some cases, you may be given additional medicines to help with side effects. Follow all directions for their use. Call your doctor or health care professional for advice if you get a fever, chills or sore throat, or other symptoms of a cold or flu. Do not treat yourself. This drug decreases your body's ability to fight infections. Try to avoid being around people who are sick. Avoid taking products that contain aspirin, acetaminophen, ibuprofen, naproxen, or ketoprofen unless instructed by your doctor. These medicines may hide a fever. Do not become pregnant while taking this medicine. Women should inform their doctor if they wish to become pregnant or think they might be pregnant. There is a potential for serious side effects to an unborn child. Use adequate birth control methods. Avoid pregnancy for at least 6 months after your last dose. Talk to your health care professional or pharmacist for more information. Do not breast-feed an infant while taking this medicine or during the 2 months after your last dose. What side effects may I notice from receiving this medicine? Side effects that  you should report to your doctor or health care professional as soon as possible: -allergic reactions like skin rash, itching or hives, swelling of the face, lips, or tongue -breathing problems -changes in vision -fast, irregular heartbeat -feeling faint or lightheaded, falls -fever, chills -mouth  sores -redness, blistering, peeling or loosening of the skin, including inside the mouth -trouble passing urine or change in the amount of urine -unusually weak or tired Side effects that usually do not require medical attention (report to your doctor or health care professional if they continue or are bothersome): -changes in skin like acne, cracks, skin dryness -constipation -diarrhea -headache -nail changes -nausea, vomiting -stomach upset -weight loss This list may not describe all possible side effects. Call your doctor for medical advice about side effects. You may report side effects to FDA at 1-800-FDA-1088. Where should I keep my medicine? This drug is given in a hospital or clinic and will not be stored at home. NOTE: This sheet is a summary. It may not cover all possible information. If you have questions about this medicine, talk to your doctor, pharmacist, or health care provider.  2018 Elsevier/Gold Standard (2014-02-24 22:27:08)   [facility] Discharge Instructions for Patients Receiving Chemotherapy  Today you received the following chemotherapy agents :  Erbitux.  To help prevent nausea and vomiting after your treatment, we encourage you to take your nausea medication as prescribed.  Take Imodium liquid  Three times daily as needed to help control diarrhea as per  Dr. Alvy Bimler.   If you develop nausea and vomiting that is not controlled by your nausea medication, call the clinic.   BELOW ARE SYMPTOMS THAT SHOULD BE REPORTED IMMEDIATELY:  *FEVER GREATER THAN 100.5 F  *CHILLS WITH OR WITHOUT FEVER  NAUSEA AND VOMITING THAT IS NOT CONTROLLED WITH YOUR NAUSEA MEDICATION  *UNUSUAL SHORTNESS OF BREATH  *UNUSUAL BRUISING OR BLEEDING  TENDERNESS IN MOUTH AND THROAT WITH OR WITHOUT PRESENCE OF ULCERS  *URINARY PROBLEMS  *BOWEL PROBLEMS  UNUSUAL RASH Items with * indicate a potential emergency and should be followed up as soon as  possible.  Feel free to call the clinic should you have any questions or concerns. The clinic phone number is [phone].  Please show the [place] at check-in to the Emergency Department and triage nurse.

## 2016-09-25 NOTE — Progress Notes (Signed)
Nutrition Follow-up:  Nutrition follow-up completed in infusion this am.  Patient with tongue cancer.    Patient reports that she is trying to still take 5 of the vital 1.5 containers and 3 of ensure containers via tube.  Family/friend at [facility]side reports but we don't always get them in.  When asked how many cans of formula do you get in on most days she reported 4-5.  Reports that she takes ensure via tube so she can get more protein and calories.  Reports that she continues with nausea but no issues with change in bowel habits, heartburn.  Reports that she give about 1 syringe full of water before and after each can of formula (56ml before and after). Reports that she does not give any additional water via tube.    RN discussed with RD after meeting with patient that patient had reported diarrhea over the last week and RN will encourage patient to take imodium.   Medications: reviewed  Labs: reviewed  Anthropometrics:   No new weight taken since 175 lb 9.6 oz on 9/11  Estimated Energy Needs  Kcals: 9480-1655 calories/d Protein: 120-135 g/d Fluid: 2.5 L/d  NUTRITION DIAGNOSIS: Unintentional weight loss continues   MALNUTRITION DIAGNOSIS: Severe malnutrition continues   INTERVENTION:   Patient noncompliant with providing 7 cans of vital 1.5 via PEG tube.  Patient does not want to use continuous pump to infuse tube feeding anymore.   Discussed calories and protein content of vital and ensure plus and vital provides a little more calorie and protein and better vitamins and mineral profile.  Could also help with diarrhea as vital for malabsorption/diarrhea issues.   Encouraged patient to provide more water via tube between feedings to keep patient hydrated.  To better meet hydration needs patient should provide additional 286ml of water QID.    MONITORING, EVALUATION, GOAL: Patient will tolerate feeding to meet greater than 90% of estimated nutrition needs.   NEXT VISIT: October 9  during infusion  Md Smola B. Zenia Resides, [place], [place] Registered Dietitian [phone] (pager)

## 2016-09-26 ENCOUNTER — Telehealth: Payer: Self-pay | Admitting: *Deleted

## 2016-09-26 NOTE — Telephone Encounter (Signed)
Oncology Nurse Navigator Documentation  Received call from patient husband.  He provided fax # and Claim # for his wife's request for disability.  Information release form delivered to [employer] inbox.  [name], RN, BSN, [place] Oncology Nurse [place] at [place] [phone]

## 2016-09-26 NOTE — Progress Notes (Signed)
Oncology Nurse Navigator Documentation  Met with Tiffany Yu in Infusion.   Provided her Patient Request for Access form to have medical records released to [employer].  She will call me with address/fax number so I can place request. We discussed plan to replace PEG adapter and/or Eugenio Hoes valve pending decision to restart continuous pump feeding during her appt with Dr. Alvy Bimler next week.  [name], RN, BSN, [place] Oncology Nurse [place] at [place] [phone]

## 2016-09-28 ENCOUNTER — Encounter: Payer: Self-pay | Admitting: *Deleted

## 2016-09-28 NOTE — Progress Notes (Signed)
[place] Work  Clinical Social Work was referred by [employer] for disability questions.  Clinical Social Worker contacted patient by phone to offer support and assess for needs.  CSW discussed LTD options through employer and SSDI options.  Patient plans to file a LTD claim through employer and CSW will make referral to [facility] for assistance with SSDI application.   Maryjean Morn, MSW, LCSW, OSW-C Clinical Social Worker [facility] [phone]

## 2016-10-02 ENCOUNTER — Encounter: Payer: Self-pay | Admitting: *Deleted

## 2016-10-02 ENCOUNTER — Other Ambulatory Visit (HOSPITAL_BASED_OUTPATIENT_CLINIC_OR_DEPARTMENT_OTHER): Payer: 59

## 2016-10-02 ENCOUNTER — Ambulatory Visit: Payer: 59

## 2016-10-02 ENCOUNTER — Ambulatory Visit (HOSPITAL_BASED_OUTPATIENT_CLINIC_OR_DEPARTMENT_OTHER): Payer: 59 | Admitting: Hematology and Oncology

## 2016-10-02 VITALS — BP 103/60 | HR 111 | Temp 99.0°F | Resp 18 | Ht 68.0 in | Wt 176.3 lb

## 2016-10-02 DIAGNOSIS — C023 Malignant neoplasm of anterior two-thirds of tongue, part unspecified: Secondary | ICD-10-CM | POA: Diagnosis not present

## 2016-10-02 DIAGNOSIS — L270 Generalized skin eruption due to drugs and medicaments taken internally: Secondary | ICD-10-CM

## 2016-10-02 DIAGNOSIS — R110 Nausea: Secondary | ICD-10-CM | POA: Diagnosis not present

## 2016-10-02 DIAGNOSIS — G893 Neoplasm related pain (acute) (chronic): Secondary | ICD-10-CM

## 2016-10-02 DIAGNOSIS — D61818 Other pancytopenia: Secondary | ICD-10-CM

## 2016-10-02 DIAGNOSIS — T451X5A Adverse effect of antineoplastic and immunosuppressive drugs, initial encounter: Secondary | ICD-10-CM

## 2016-10-02 DIAGNOSIS — Z23 Encounter for immunization: Secondary | ICD-10-CM

## 2016-10-02 DIAGNOSIS — J09X2 Influenza due to identified novel influenza A virus with other respiratory manifestations: Secondary | ICD-10-CM

## 2016-10-02 LAB — COMPREHENSIVE METABOLIC PANEL
ALT: 12 U/L (ref 0–55)
AST: 19 U/L (ref 5–34)
Albumin: 3.3 g/dL — ABNORMAL LOW (ref 3.5–5.0)
Alkaline Phosphatase: 89 U/L (ref 40–150)
Anion Gap: 8 mEq/L (ref 3–11)
BILIRUBIN TOTAL: 0.49 mg/dL (ref 0.20–1.20)
BUN: 19 mg/dL (ref 7.0–26.0)
CHLORIDE: 103 meq/L (ref 98–109)
CO2: 28 meq/L (ref 22–29)
CREATININE: 1.1 mg/dL (ref 0.6–1.1)
Calcium: 9.5 mg/dL (ref 8.4–10.4)
EGFR: 60 mL/min/{1.73_m2} — ABNORMAL LOW (ref >90)
GLUCOSE: 103 mg/dL (ref 70–140)
Potassium: 3.7 mEq/L (ref 3.5–5.1)
SODIUM: 139 meq/L (ref 136–145)
TOTAL PROTEIN: 7.4 g/dL (ref 6.4–8.3)

## 2016-10-02 LAB — MAGNESIUM: Magnesium: 1.5 mg/dl (ref 1.5–2.5)

## 2016-10-02 LAB — CBC WITH DIFFERENTIAL/PLATELET
BASO%: 0.2 % (ref 0.0–2.0)
BASOS ABS: 0 10*3/uL (ref 0.0–0.1)
EOS ABS: 0.1 10*3/uL (ref 0.0–0.5)
EOS%: 2.5 % (ref 0.0–7.0)
HCT: 24.8 % — ABNORMAL LOW (ref 34.8–46.6)
HEMOGLOBIN: 8.6 g/dL — AB (ref 11.6–15.9)
LYMPH%: 15.7 % (ref 14.0–49.7)
MCH: 29.7 pg (ref 25.1–34.0)
MCHC: 34.8 g/dL (ref 31.5–36.0)
MCV: 85.3 fL (ref 79.5–101.0)
MONO#: 0.4 10*3/uL (ref 0.1–0.9)
MONO%: 18.9 % — AB (ref 0.0–14.0)
NEUT%: 62.7 % (ref 38.4–76.8)
NEUTROS ABS: 1.4 10*3/uL — AB (ref 1.5–6.5)
Platelets: 99 10*3/uL — ABNORMAL LOW (ref 145–400)
RBC: 2.91 10*6/uL — AB (ref 3.70–5.45)
RDW: 14.4 % (ref 11.2–14.5)
WBC: 2.2 10*3/uL — ABNORMAL LOW (ref 3.9–10.3)
lymph#: 0.3 10*3/uL — ABNORMAL LOW (ref 0.9–3.3)

## 2016-10-02 MED ORDER — INFLUENZA VAC SPLIT QUAD 0.5 ML IM SUSY
0.5000 mL | PREFILLED_SYRINGE | Freq: Once | INTRAMUSCULAR | Status: AC
  Administered 2016-10-02: 0.5 mL via INTRAMUSCULAR
  Filled 2016-10-02: qty 0.5

## 2016-10-02 MED ORDER — SODIUM CHLORIDE 0.9% FLUSH
10.0000 mL | INTRAVENOUS | Status: DC | PRN
  Administered 2016-10-02: 10 mL
  Filled 2016-10-02: qty 10

## 2016-10-02 MED ORDER — AMOXICILLIN 250 MG/5ML PO SUSR: 500.0000 mg | Freq: Three times a day (TID) | ORAL | 0 refills | Status: DC

## 2016-10-02 NOTE — Patient Instructions (Signed)
Implanted Port Home Guide An implanted port is a type of central line that is placed under the skin. Central lines are used to provide IV access when treatment or nutrition needs to be given through a person's veins. Implanted ports are used for long-term IV access. An implanted port may be placed because:  You need IV medicine that would be irritating to the small veins in your hands or arms.  You need long-term IV medicines, such as antibiotics.  You need IV nutrition for a long period.  You need frequent blood draws for lab tests.  You need dialysis.  Implanted ports are usually placed in the chest area, but they can also be placed in the upper arm, the abdomen, or the leg. An implanted port has two main parts:  Reservoir. The reservoir is round and will appear as a small, raised area under your skin. The reservoir is the part where a needle is inserted to give medicines or draw blood.  Catheter. The catheter is a thin, flexible tube that extends from the reservoir. The catheter is placed into a large vein. Medicine that is inserted into the reservoir goes into the catheter and then into the vein.  How will I care for my incision site? Do not get the incision site wet. Bathe or shower as directed by your health care provider. How is my port accessed? Special steps must be taken to access the port:  Before the port is accessed, a numbing cream can be placed on the skin. This helps numb the skin over the port site.  Your health care provider uses a sterile technique to access the port. ? Your health care provider must put on a mask and sterile gloves. ? The skin over your port is cleaned carefully with an antiseptic and allowed to dry. ? The port is gently pinched between sterile gloves, and a needle is inserted into the port.  Only "non-coring" port needles should be used to access the port. Once the port is accessed, a blood return should be checked. This helps ensure that the port  is in the vein and is not clogged.  If your port needs to remain accessed for a constant infusion, a clear (transparent) bandage will be placed over the needle site. The bandage and needle will need to be changed every week, or as directed by your health care provider.  Keep the bandage covering the needle clean and dry. Do not get it wet. Follow your health care provider's instructions on how to take a shower or bath while the port is accessed.  If your port does not need to stay accessed, no bandage is needed over the port.  What is flushing? Flushing helps keep the port from getting clogged. Follow your health care provider's instructions on how and when to flush the port. Ports are usually flushed with saline solution or a medicine called heparin. The need for flushing will depend on how the port is used.  If the port is used for intermittent medicines or blood draws, the port will need to be flushed: ? After medicines have been given. ? After blood has been drawn. ? As part of routine maintenance.  If a constant infusion is running, the port may not need to be flushed.  How long will my port stay implanted? The port can stay in for as long as your health care provider thinks it is needed. When it is time for the port to come out, surgery will be   done to remove it. The procedure is similar to the one performed when the port was put in. When should I seek immediate medical care? When you have an implanted port, you should seek immediate medical care if:  You notice a bad smell coming from the incision site.  You have swelling, redness, or drainage at the incision site.  You have more swelling or pain at the port site or the surrounding area.  You have a fever that is not controlled with medicine.  This information is not intended to replace advice given to you by your health care provider. Make sure you discuss any questions you have with your health care provider. Document  Released: 12/18/2004 Document Revised: 05/26/2015 Document Reviewed: 08/25/2012 Elsevier Interactive Patient Education  2017 [employer]  

## 2016-10-03 ENCOUNTER — Encounter: Payer: Self-pay | Admitting: *Deleted

## 2016-10-03 ENCOUNTER — Encounter: Payer: Self-pay | Admitting: Hematology and Oncology

## 2016-10-03 NOTE — Assessment & Plan Note (Signed)
She has persistent acneform rash on the face despite topical antibiotic cream I recommend short course of amoxicillin and to hold treatment this week

## 2016-10-03 NOTE — Assessment & Plan Note (Signed)
We discussed pain management extensively She declined addition of fentanyl patch We will continue liquid Dilaudid

## 2016-10-03 NOTE — Progress Notes (Signed)
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 Tongue, biopsy, Anterior deep margin left SQUAMOUS CELL CARCINOMA 3. Tongue, biopsy, Posterior deep margin left NEGATIVE FOR CARCINOMA 4. Tongue, biopsy, Posterior margin left SQUAMOUS CELL CARCINOMA 5. Tongue, excisional biopsy, New anterior deep margin NEGATIVE FOR CARCINOMA 6. Tongue, excisional biopsy, New posterior deep margin SMALL FOCUS OF SQUAMOUS CELL CARCINOMA (0.1 CM, ONLY PRESENT AT THE DEEPER PERMINENT SECTION) 7. Tongue, resection for tumor, Left lateral INFILTRATIVE KERATINIZED SQUAMOUS CELL CARCINOMA (3.2 CM) THE TUMOR INVADES UNDERNEATH MUSCLE OF THE TONGUE (2.0 CM, PT2) LYMPHOVASCULAR AND PERINEURAL INVASION IDENTIFIED SQUAMOUS CELL CARCINOMA PRESENTED AT (FINAL MARGINS REFER TO PART 2-6) LATERAL INKED MARGIN (1.0 CM) MEDIAL INKED MARGIN (0.6 CM)      02/08/2016 Surgery    SURGICAL PROCEDURES: 1. Left hemiglossectomy with primary closure. 2. Left selective neck dissection (zones I-III).      03/02/2016 Imaging    CT chest with contrast: No evidence of metastatic disease in the chest. 2. Aortic atherosclerosis.       03/02/2016 Imaging    CT neck with contrast showed : Newly enlarged and round 10 mm  right level 1b lymph node (series 5, image 45). The level 1A nodes remain normal. This right 1 B node is nonspecific but suspicious for progressive nodal metastatic disease. It might be amenable to Ultrasound-guided FNA. 2. Interval left hemiglossectomy and selective left neck dissection with confluent indeterminate soft tissue from the anterior left submandibular space through the left carotid space and obscuring the left level 1B, level 2, and level IIIa nodal stations. This study will serve as a new postoperative baseline of this area. Attention directed on close interval follow-up. 3. Mild soft tissue thickening also along the left lateral tongue resection margin. Attention directed on followup. 4. Chest CT today reported separately      03/14/2016 Procedure    She has normal baseline hearing test      03/15/2016 Procedure    1. Successful placement of a right internal jugular approach power injectable Port-A-Cath. The Port a catheter is ready for immediate use. 2. Successful fluoroscopic insertion of a 20-French pull-through gastrostomy tube. The gastrostomy may be used immediately for medication administration and may be utilized in 24 hrs for the initiation of feeds.      03/15/2016 - 04/30/2016 Radiation Therapy    Tongue and Bilateral Neck treated to 68 Gy in 33 fractions      03/16/2016 - 04/27/2016 Chemotherapy    She received weekly cisplatin. Cycle 2-6 reduced dose at 20 mg/m2 due to significant side-effects and poor tolerance       04/04/2016 Adverse Reaction    Chemotherapy is placed on hold due to acute renal failure      04/13/2016 Adverse Reaction    Chemo was placed on hold due to shingles outbreak and paronychia  05/29/2016 - 06/04/2016 Hospital Admission    She was admitted to the hospital for management of nausea, vomiting and inability to tolerate tube feeding      06/06/2016 - 06/13/2016 Hospital Admission    She was admitted to the hospital for management of nausea,  vomiting and inability to tolerate tube feeding      08/09/2016 Imaging    1. New bilateral upper lobe ground-glass nodules. Favor infectious or inflammatory process. Recommend close attention on routine follow-up. 2. Angular nodule RIGHT upper lobe is also favored infectious or inflammatory. Recommend attention on follow-up as above. 3. No mediastinal or supraclavicular lymphadenopathy. 4. Small RIGHT thyroid nodule does not meet biopsy criteria. Please see separate CT neck dictation.      08/09/2016 Imaging    1. Progression of disease with new/markedly worsened mass of the left base of tongue measuring 2.9 x 2.2 x 2.3 centimeters. Persistent soft tissue thickening of the postoperative left neck extending from the angle of the mandible to the level of the hyoid bone. 2. Confluent, centrally necrotic mass in the right submental region, at the site of previously seen enlarged level 1B lymph node. This mass extends into the floor of the mouth and displaces or invades the right sublingual gland and the right mylohyoid and hyoglossus muscles. 3. Centrally necrotic right level IIa lymph node is new from the prior study and consistent with metastatic spread. No other lower cervical lymphadenopathy. 4. Multiple ground-glass opacities in the lung apices. Please see dedicated report for concomitant chest CT for more complete characterization. 5. Thickening of the epiglottis and aryepiglottic folds is likely secondary to prior radiation treatment. No acute airway compromise.      08/22/2016 PET scan    1. The recently described progressive cervical disease is markedly hypermetabolic. This includes local recurrence in the left base of tongue and a confluent right submental nodal mass. In addition, there are small hypermetabolic lymph nodes on the right in station IIb and the anterior to the lower trachea.  2. No typical metastatic disease within the chest, abdomen or pelvis. 3. The recently described  ground-glass opacities in both upper lobes have enlarged, become more dense and are associated with moderate hypermetabolic activity. These are new from February and probably inflammatory/infectious. CT follow-up after appropriate therapy recommended.      08/27/2016 -  Chemotherapy    She received palliative chemo with carboplatin/5FU and cetuximab      09/11/2016 Adverse Reaction    Dose of cetuximab is reduced due to skin toxicity       INTERVAL HISTORY: Please see below for problem oriented charting. She returns for chemotherapy and follow-up Her skin rash is stable but not much improved She denies diarrhea She have regular, constant nausea but no vomiting She has not lost any weight She is tube feed dependent She is only able to swallow minimum amount of liquids and is dependent on advance home care to deliver IV fluids on a regular basis She felt that her pain control is stable She felt that the mass under the base of the tongue has not changed since chemotherapy  REVIEW OF SYSTEMS:   Constitutional: Denies fevers, chills or abnormal weight loss Eyes: Denies blurriness of vision Respiratory: Denies cough, dyspnea or wheezes Cardiovascular: Denies palpitation, chest discomfort or lower extremity swelling Lymphatics: Denies new lymphadenopathy or easy bruising Neurological:Denies numbness, tingling or new weaknesses Behavioral/Psych: Mood is stable, no new changes  All other systems were reviewed with the patient and are  negative.  I have reviewed the past medical history, past surgical history, social history and family history with the patient and they are unchanged from previous note.  ALLERGIES:  has No Known Allergies.  MEDICATIONS:  Current Outpatient Prescriptions  Medication Sig Dispense Refill  . Amino Acids-Protein Hydrolys (FEEDING SUPPLEMENT, PRO-STAT SUGAR FREE 64,) LIQD Place 30 mLs into feeding tube daily. 900 mL 0  . amoxicillin (AMOXIL) 250 MG/5ML  suspension Take 10 mLs (500 mg total) by mouth 3 (three) times daily. 300 mL 0  . chlorhexidine (PERIDEX) 0.12 % solution Rinse with 15 mls three times daily for 30 seconds. Use after breakfast, dinner, and at bedtime. Spit out excess. Do not swallow. 480 mL prn  . clindamycin (CLINDAGEL) 1 % gel Apply topically 2 (two) times daily. 30 g 0  . HYDROmorphone HCl (DILAUDID) 1 MG/ML LIQD Place 6 mLs (6 mg total) into feeding tube every 3 (three) hours as needed for moderate pain or severe pain. 473 mL 0  . levothyroxine (SYNTHROID, LEVOTHROID) 75 MCG tablet Take 1 tablet (75 mcg total) by mouth daily before breakfast. 30 tablet 9  . lidocaine-prilocaine (EMLA) cream Apply to affected area once 30 g 3  . LORazepam (ATIVAN) 1 MG tablet TAKE 1 TABLET BY MOUTH EVERY 8 HOURS AS NEEDED FOR ANXIETY/SLEEP 60 tablet 0  . mirtazapine (REMERON SOL-TAB) 15 MG disintegrating tablet Take 1 tablet (15 mg total) by mouth at bedtime. 30 tablet 9  . Nutritional Supplements (FEEDING SUPPLEMENT, VITAL 1.5 CAL,) LIQD D/C continuous feeding pump. Give Vital 1.5 or equivalent, 7 bottles daily, bolus feeding via PEG. Flush tube with 60 mL free water before and after bolus feeding QID. In addition, flush tube with 250 mL free water QID. Send formula and syringes. Pick up pump. 7 Can 4  . OLANZapine (ZYPREXA) 5 MG tablet Place 1 tablet (5 mg total) into feeding tube at bedtime. (Patient not taking: Reported on 08/10/2016) 30 tablet 9  . ondansetron (ZOFRAN ODT) 8 MG disintegrating tablet Take 1 tablet (8 mg total) by mouth every 8 (eight) hours as needed for nausea or vomiting. (Patient not taking: Reported on 08/10/2016) 30 tablet 5  . ondansetron (ZOFRAN) 8 MG tablet TAKE 1 TABLET BY MOUTH 2 TIMES DAILY AS NEEDED FOR REFRACTORY NAUSEA / VOMITING. START ON DAY 3 AFTER CHEMO. 30 tablet 1  . polyethylene glycol (MIRALAX / GLYCOLAX) packet Place 17 g into feeding tube daily. 14 each 0  . prochlorperazine (COMPAZINE) 10 MG tablet TAKE 1  TABLET BY MOUTH EVERY 6 HOURS AS NEEDED FOR NAUSEA AND VOMITING (Patient not taking: Reported on 08/10/2016) 30 tablet 1  . prochlorperazine (COMPAZINE) 10 MG tablet Take 1 tablet (10 mg total) by mouth every 6 (six) hours as needed (Nausea or vomiting). 30 tablet 1  . senna (SENOKOT) 8.6 MG tablet Take 1 tablet by mouth 2 (two) times daily.     No current facility-administered medications for this visit.    Facility-Administered Medications Ordered in Other Visits  Medication Dose Route Frequency Provider Last Rate Last Dose  . heparin lock flush 100 unit/mL  500 Units Intracatheter Once PRN Alvy Bimler, Shavelle Runkel, MD      . sodium chloride flush (NS) 0.9 % injection 10 mL  10 mL Intracatheter PRN Alvy Bimler, Mishawn Didion, MD        PHYSICAL EXAMINATION: ECOG PERFORMANCE STATUS: 2 - Symptomatic, <50% confined to bed  Vitals:   10/02/16 0913  BP: 103/60  Pulse: (!) 111  Resp: 18  Temp: 99 F (37.2 C)  SpO2: 100%   Filed Weights   10/02/16 0913  Weight: 176 lb 4.8 oz (80 kg)    GENERAL:alert, no distress and comfortable.  She is profoundly debilitated SKIN: The acneform rash on her face has not changed EYES: normal, Conjunctiva are pink and non-injected, sclera clear OROPHARYNX:no exudate, no erythema and lips, buccal mucosa, and tongue normal.  She have difficulties opening her jaw NECK: She has persistent palpable mass at the base of the tongue, stable LYMPH:  no palpable lymphadenopathy in the cervical, axillary or inguinal LUNGS: clear to auscultation and percussion with normal breathing effort HEART: regular rate & rhythm and no murmurs and no lower extremity edema ABDOMEN:abdomen soft, non-tender and normal bowel sounds Musculoskeletal:no cyanosis of digits and no clubbing  NEURO: alert & oriented x 3 with fluent speech, no focal motor/sensory deficits  LABORATORY DATA:  I have reviewed the data as listed    Component Value Date/Time   NA 139 10/02/2016 0838   K 3.7 10/02/2016 0838   CL  101 06/12/2016 0540   CO2 28 10/02/2016 0838   GLUCOSE 103 10/02/2016 0838   BUN 19.0 10/02/2016 0838   CREATININE 1.1 10/02/2016 0838   CALCIUM 9.5 10/02/2016 0838   PROT 7.4 10/02/2016 0838   ALBUMIN 3.3 (L) 10/02/2016 0838   AST 19 10/02/2016 0838   ALT 12 10/02/2016 0838   ALKPHOS 89 10/02/2016 0838   BILITOT 0.49 10/02/2016 0838   GFRNONAA 56 (L) 06/12/2016 0540   GFRAA >60 06/12/2016 0540    No results found for: SPEP, UPEP  Lab Results  Component Value Date   WBC 2.2 (L) 10/02/2016   NEUTROABS 1.4 (L) 10/02/2016   HGB 8.6 (L) 10/02/2016   HCT 24.8 (L) 10/02/2016   MCV 85.3 10/02/2016   PLT 99 large plts seen (L) 10/02/2016      Chemistry      Component Value Date/Time   NA 139 10/02/2016 0838   K 3.7 10/02/2016 0838   CL 101 06/12/2016 0540   CO2 28 10/02/2016 0838   BUN 19.0 10/02/2016 0838   CREATININE 1.1 10/02/2016 0838      Component Value Date/Time   CALCIUM 9.5 10/02/2016 0838   ALKPHOS 89 10/02/2016 0838   AST 19 10/02/2016 0838   ALT 12 10/02/2016 0838   BILITOT 0.49 10/02/2016 0838       ASSESSMENT & PLAN:  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[place]) The patient has experienced numerous side effects His skin rash is stable but not much improved with conservative topical antibiotic cream She has persistent palpable mass at the base of her tongue which has not changed despite treatment She is pancytopenic with significant nausea, pain and dehydration requiring regular IV fluid support I recommend we hold treatment this week I plan to order restaging PET CT scan to assess response to treatment before we proceed with further chemotherapy She agree with the plan of care We discussed the importance of preventive care and reviewed the vaccination programs. She does not have any prior allergic reactions to influenza vaccination. She agrees to proceed with influenza vaccination today and we will administer it today at the clinic.   Acneiform drug  eruption She has persistent acneform rash on the face despite topical antibiotic cream I recommend short course of amoxicillin and to hold treatment this week  Cancer associated pain We discussed pain management extensively She declined addition of fentanyl patch We will continue liquid Dilaudid  Chemotherapy-induced nausea She has recent chemotherapy induced nausea I also recommend continue IV fluid support at home through advance home care and she agreed She will continue antiemetics as needed  Pancytopenia, acquired [facility]) She has acquired pancytopenia, not unexpected given recent chemotherapy She does not need G-CSF support for now The patient declined transfusion support for religious reasons Will monitor closely   Orders Placed This Encounter  Procedures  . NM PET Image Restag (PS) Skull Base To Thigh    Standing Status:   Future    Standing Expiration Date:   10/02/2017    Order Specific Question:   If indicated for the ordered procedure, I authorize the administration of a radiopharmaceutical per Radiology protocol    Answer:   Yes    Order Specific Question:   Preferred imaging location?    Answer:   [facility]    Order Specific Question:   Radiology Contrast Protocol - do NOT remove file path    Answer:   [email or web address]   All questions were answered. The patient knows to call the clinic with any problems, questions or concerns. No barriers to learning was detected. I spent 30 minutes counseling the patient face to face. The total time spent in the appointment was 55 minutes and more than 50% was on counseling and review of test results     [name], MD 10/03/2016 9:11 AM

## 2016-10-03 NOTE — Assessment & Plan Note (Signed)
She has recent chemotherapy induced nausea I also recommend continue IV fluid support at home through advance home care and she agreed She will continue antiemetics as needed

## 2016-10-03 NOTE — Assessment & Plan Note (Signed)
She has acquired pancytopenia, not unexpected given recent chemotherapy She does not need G-CSF support for now The patient declined transfusion support for religious reasons Will monitor closely

## 2016-10-03 NOTE — Assessment & Plan Note (Signed)
The patient has experienced numerous side effects His skin rash is stable but not much improved with conservative topical antibiotic cream She has persistent palpable mass at the base of her tongue which has not changed despite treatment She is pancytopenic with significant nausea, pain and dehydration requiring regular IV fluid support I recommend we hold treatment this week I plan to order restaging PET CT scan to assess response to treatment before we proceed with further chemotherapy She agree with the plan of care We discussed the importance of preventive care and reviewed the vaccination programs. She does not have any prior allergic reactions to influenza vaccination. She agrees to proceed with influenza vaccination today and we will administer it today at the clinic.

## 2016-10-04 NOTE — Progress Notes (Signed)
Oncology Nurse Navigator Documentation  To provide support, encouragement and care continuity, met with Ms. Teuscher during Est Pt appt with Dr. Alvy Bimler.  She was accompanied by her husband. They voiced understanding of plan for next chemo and ASAP PET. They understand I can be contacted with needs/concerns.  [name], RN, BSN, [place] Oncology Nurse [place] [facility] [place] [phone]

## 2016-10-04 NOTE — Progress Notes (Addendum)
A user error has taken place: encounter opened in error, closed for administrative reasons.

## 2016-10-05 ENCOUNTER — Encounter: Payer: Self-pay | Admitting: Pharmacist

## 2016-10-08 ENCOUNTER — Encounter
Admission: RE | Admit: 2016-10-08 | Discharge: 2016-10-08 | Disposition: A | Discharge status: Home / Self Care | Payer: 59 | Source: Ambulatory Visit | Attending: Hematology and Oncology | Admitting: Hematology and Oncology

## 2016-10-08 ENCOUNTER — Encounter: Payer: Self-pay | Admitting: Hematology and Oncology

## 2016-10-08 ENCOUNTER — Telehealth: Payer: Self-pay | Admitting: Hematology and Oncology

## 2016-10-08 ENCOUNTER — Ambulatory Visit (HOSPITAL_BASED_OUTPATIENT_CLINIC_OR_DEPARTMENT_OTHER): Payer: 59 | Admitting: Hematology and Oncology

## 2016-10-08 DIAGNOSIS — G4700 Insomnia, unspecified: Secondary | ICD-10-CM | POA: Diagnosis not present

## 2016-10-08 DIAGNOSIS — D61818 Other pancytopenia: Secondary | ICD-10-CM

## 2016-10-08 DIAGNOSIS — R5381 Other malaise: Secondary | ICD-10-CM

## 2016-10-08 DIAGNOSIS — L270 Generalized skin eruption due to drugs and medicaments taken internally: Secondary | ICD-10-CM | POA: Diagnosis not present

## 2016-10-08 DIAGNOSIS — I890 Lymphedema, not elsewhere classified: Secondary | ICD-10-CM

## 2016-10-08 DIAGNOSIS — C023 Malignant neoplasm of anterior two-thirds of tongue, part unspecified: Secondary | ICD-10-CM

## 2016-10-08 DIAGNOSIS — G893 Neoplasm related pain (acute) (chronic): Secondary | ICD-10-CM | POA: Diagnosis not present

## 2016-10-08 LAB — GLUCOSE, CAPILLARY: Glucose-Capillary: 93 mg/dL (ref 65–99)

## 2016-10-08 MED ORDER — FLUDEOXYGLUCOSE F - 18 (FDG) INJECTION
12.4400 | Freq: Once | INTRAVENOUS | Status: AC | PRN
  Administered 2016-10-08: 12.44 via INTRAVENOUS

## 2016-10-08 NOTE — Assessment & Plan Note (Signed)
The patient had significant problems with side effects to treatment in the past, including uncontrolled nausea, vomiting and dehydration Due to potential immunocompromise state, I would  recommend home health agency to schedule IV fluids if needed and to get pump disconnect at home I explained to the patient why this is necessary and I agree with the plan of care

## 2016-10-08 NOTE — Progress Notes (Signed)
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 Tongue, biopsy, Anterior deep margin left SQUAMOUS CELL CARCINOMA 3. Tongue, biopsy, Posterior deep margin left NEGATIVE FOR CARCINOMA 4. Tongue, biopsy, Posterior margin left SQUAMOUS CELL CARCINOMA 5. Tongue, excisional biopsy, New anterior deep margin NEGATIVE FOR CARCINOMA 6. Tongue, excisional biopsy, New posterior deep margin SMALL FOCUS OF SQUAMOUS CELL CARCINOMA (0.1 CM, ONLY PRESENT AT THE DEEPER PERMINENT SECTION) 7. Tongue, resection for tumor, Left lateral INFILTRATIVE KERATINIZED SQUAMOUS CELL CARCINOMA (3.2 CM) THE TUMOR INVADES UNDERNEATH MUSCLE OF THE TONGUE (2.0 CM, PT2) LYMPHOVASCULAR AND PERINEURAL INVASION IDENTIFIED SQUAMOUS CELL CARCINOMA PRESENTED AT (FINAL MARGINS REFER TO PART 2-6) LATERAL INKED MARGIN (1.0 CM) MEDIAL INKED MARGIN (0.6 CM)      02/08/2016 Surgery    SURGICAL PROCEDURES: 1. Left hemiglossectomy with primary closure. 2. Left selective neck dissection (zones I-III).      03/02/2016 Imaging    CT chest with contrast: No evidence of metastatic disease in the chest. 2. Aortic atherosclerosis.       03/02/2016 Imaging    CT neck with contrast showed : Newly enlarged and round 10 mm  right level 1b lymph node (series 5, image 45). The level 1A nodes remain normal. This right 1 B node is nonspecific but suspicious for progressive nodal metastatic disease. It might be amenable to Ultrasound-guided FNA. 2. Interval left hemiglossectomy and selective left neck dissection with confluent indeterminate soft tissue from the anterior left submandibular space through the left carotid space and obscuring the left level 1B, level 2, and level IIIa nodal stations. This study will serve as a new postoperative baseline of this area. Attention directed on close interval follow-up. 3. Mild soft tissue thickening also along the left lateral tongue resection margin. Attention directed on followup. 4. Chest CT today reported separately      03/14/2016 Procedure    She has normal baseline hearing test      03/15/2016 Procedure    1. Successful placement of a right internal jugular approach power injectable Port-A-Cath. The Port a catheter is ready for immediate use. 2. Successful fluoroscopic insertion of a 20-French pull-through gastrostomy tube. The gastrostomy may be used immediately for medication administration and may be utilized in 24 hrs for the initiation of feeds.      03/15/2016 - 04/30/2016 Radiation Therapy    Tongue and Bilateral Neck treated to 68 Gy in 33 fractions      03/16/2016 - 04/27/2016 Chemotherapy    She received weekly cisplatin. Cycle 2-6 reduced dose at 20 mg/m2 due to significant side-effects and poor tolerance       04/04/2016 Adverse Reaction    Chemotherapy is placed on hold due to acute renal failure      04/13/2016 Adverse Reaction    Chemo was placed on hold due to shingles outbreak and paronychia  05/29/2016 - 06/04/2016 Hospital Admission    She was admitted to the hospital for management of nausea, vomiting and inability to tolerate tube feeding      06/06/2016 - 06/13/2016 Hospital Admission    She was admitted to the hospital for management of nausea,  vomiting and inability to tolerate tube feeding      08/09/2016 Imaging    1. New bilateral upper lobe ground-glass nodules. Favor infectious or inflammatory process. Recommend close attention on routine follow-up. 2. Angular nodule RIGHT upper lobe is also favored infectious or inflammatory. Recommend attention on follow-up as above. 3. No mediastinal or supraclavicular lymphadenopathy. 4. Small RIGHT thyroid nodule does not meet biopsy criteria. Please see separate CT neck dictation.      08/09/2016 Imaging    1. Progression of disease with new/markedly worsened mass of the left base of tongue measuring 2.9 x 2.2 x 2.3 centimeters. Persistent soft tissue thickening of the postoperative left neck extending from the angle of the mandible to the level of the hyoid bone. 2. Confluent, centrally necrotic mass in the right submental region, at the site of previously seen enlarged level 1B lymph node. This mass extends into the floor of the mouth and displaces or invades the right sublingual gland and the right mylohyoid and hyoglossus muscles. 3. Centrally necrotic right level IIa lymph node is new from the prior study and consistent with metastatic spread. No other lower cervical lymphadenopathy. 4. Multiple ground-glass opacities in the lung apices. Please see dedicated report for concomitant chest CT for more complete characterization. 5. Thickening of the epiglottis and aryepiglottic folds is likely secondary to prior radiation treatment. No acute airway compromise.      08/22/2016 PET scan    1. The recently described progressive cervical disease is markedly hypermetabolic. This includes local recurrence in the left base of tongue and a confluent right submental nodal mass. In addition, there are small hypermetabolic lymph nodes on the right in station IIb and the anterior to the lower trachea.  2. No typical metastatic disease within the chest, abdomen or pelvis. 3. The recently described  ground-glass opacities in both upper lobes have enlarged, become more dense and are associated with moderate hypermetabolic activity. These are new from February and probably inflammatory/infectious. CT follow-up after appropriate therapy recommended.      08/27/2016 -  Chemotherapy    She received palliative chemo with carboplatin/5FU and cetuximab      09/11/2016 Adverse Reaction    Dose of cetuximab is reduced due to skin toxicity      10/08/2016 PET scan    1. Decrease in metabolic activity of LEFT base of tongue lesion. 2. Decrease in size and metabolic activity of right submental adenopathy/malignancy 3. Reduction in metabolic activity of RIGHT level 2 lymph node. 4. No evidence of metastatic progression chest, abdomen, pelvis. 5. Persistent bilateral upper lobe foci of consolidation. Favored inflammatory or infectious etiology and consider adverse drug reaction.       INTERVAL HISTORY: Please see below for problem oriented charting. She returns for further follow-up Her skin rash is improving Her pain is stable She complained of insomnia On review of her medications, it is not clear that the patient is taking all her medications consistently She is using her feeding tube She denies recent nausea or vomiting  REVIEW OF SYSTEMS:   Constitutional: Denies fevers, chills or abnormal weight loss Eyes: Denies blurriness of vision Ears, nose, mouth, throat, and face: Denies mucositis or sore throat Respiratory: Denies  cough, dyspnea or wheezes Cardiovascular: Denies palpitation, chest discomfort or lower extremity swelling Gastrointestinal:  Denies nausea, heartburn or change in bowel habits Lymphatics: Denies new lymphadenopathy or easy bruising Neurological:Denies numbness, tingling or new weaknesses Behavioral/Psych: Mood is stable, no new changes  All other systems were reviewed with the patient and are negative.  I have reviewed the past medical history, past surgical  history, social history and family history with the patient and they are unchanged from previous note.  ALLERGIES:  has No Known Allergies.  MEDICATIONS:  Current Outpatient Prescriptions  Medication Sig Dispense Refill  . Amino Acids-Protein Hydrolys (FEEDING SUPPLEMENT, PRO-STAT SUGAR FREE 64,) LIQD Place 30 mLs into feeding tube daily. 900 mL 0  . amoxicillin (AMOXIL) 250 MG/5ML suspension Take 10 mLs (500 mg total) by mouth 3 (three) times daily. 300 mL 0  . chlorhexidine (PERIDEX) 0.12 % solution Rinse with 15 mls three times daily for 30 seconds. Use after breakfast, dinner, and at bedtime. Spit out excess. Do not swallow. 480 mL prn  . clindamycin (CLINDAGEL) 1 % gel Apply topically 2 (two) times daily. 30 g 0  . HYDROmorphone HCl (DILAUDID) 1 MG/ML LIQD Place 6 mLs (6 mg total) into feeding tube every 3 (three) hours as needed for moderate pain or severe pain. 473 mL 0  . levothyroxine (SYNTHROID, LEVOTHROID) 75 MCG tablet Take 1 tablet (75 mcg total) by mouth daily before breakfast. 30 tablet 9  . lidocaine-prilocaine (EMLA) cream Apply to affected area once 30 g 3  . LORazepam (ATIVAN) 1 MG tablet TAKE 1 TABLET BY MOUTH EVERY 8 HOURS AS NEEDED FOR ANXIETY/SLEEP 60 tablet 0  . mirtazapine (REMERON SOL-TAB) 15 MG disintegrating tablet Take 1 tablet (15 mg total) by mouth at bedtime. 30 tablet 9  . Nutritional Supplements (FEEDING SUPPLEMENT, VITAL 1.5 CAL,) LIQD D/C continuous feeding pump. Give Vital 1.5 or equivalent, 7 bottles daily, bolus feeding via PEG. Flush tube with 60 mL free water before and after bolus feeding QID. In addition, flush tube with 250 mL free water QID. Send formula and syringes. Pick up pump. 7 Can 4  . OLANZapine (ZYPREXA) 5 MG tablet Place 1 tablet (5 mg total) into feeding tube at bedtime. (Patient not taking: Reported on 08/10/2016) 30 tablet 9  . ondansetron (ZOFRAN ODT) 8 MG disintegrating tablet Take 1 tablet (8 mg total) by mouth every 8 (eight) hours as  needed for nausea or vomiting. (Patient not taking: Reported on 08/10/2016) 30 tablet 5  . ondansetron (ZOFRAN) 8 MG tablet TAKE 1 TABLET BY MOUTH 2 TIMES DAILY AS NEEDED FOR REFRACTORY NAUSEA / VOMITING. START ON DAY 3 AFTER CHEMO. 30 tablet 1  . polyethylene glycol (MIRALAX / GLYCOLAX) packet Place 17 g into feeding tube daily. 14 each 0  . prochlorperazine (COMPAZINE) 10 MG tablet TAKE 1 TABLET BY MOUTH EVERY 6 HOURS AS NEEDED FOR NAUSEA AND VOMITING (Patient not taking: Reported on 08/10/2016) 30 tablet 1  . prochlorperazine (COMPAZINE) 10 MG tablet Take 1 tablet (10 mg total) by mouth every 6 (six) hours as needed (Nausea or vomiting). 30 tablet 1  . senna (SENOKOT) 8.6 MG tablet Take 1 tablet by mouth 2 (two) times daily.     No current facility-administered medications for this visit.    Facility-Administered Medications Ordered in Other Visits  Medication Dose Route Frequency Provider Last Rate Last Dose  . heparin lock flush 100 unit/mL  500 Units Intracatheter Once PRN [name], MD      .  sodium chloride flush (NS) 0.9 % injection 10 mL  10 mL Intracatheter PRN Alvy Bimler, Japhet Morgenthaler, MD        PHYSICAL EXAMINATION: ECOG PERFORMANCE STATUS: 2 - Symptomatic, <50% confined to bed  Vitals:   10/08/16 1359  BP: 128/66  Pulse: (!) 107  Resp: 20  Temp: 98.6 F (37 C)  SpO2: 97%   Filed Weights   10/08/16 1359  Weight: 176 lb (79.8 kg)    GENERAL:alert, no distress and comfortable SKIN: She has persistent skin rash on her face, stable EYES: normal, Conjunctiva are pink and non-injected, sclera clear Musculoskeletal:no cyanosis of digits and no clubbing  NEURO: alert & oriented x 3 with fluent speech, no focal motor/sensory deficits  LABORATORY DATA:  I have reviewed the data as listed    Component Value Date/Time   NA 139 10/02/2016 0838   K 3.7 10/02/2016 0838   CL 101 06/12/2016 0540   CO2 28 10/02/2016 0838   GLUCOSE 103 10/02/2016 0838   BUN 19.0 10/02/2016 0838    CREATININE 1.1 10/02/2016 0838   CALCIUM 9.5 10/02/2016 0838   PROT 7.4 10/02/2016 0838   ALBUMIN 3.3 (L) 10/02/2016 0838   AST 19 10/02/2016 0838   ALT 12 10/02/2016 0838   ALKPHOS 89 10/02/2016 0838   BILITOT 0.49 10/02/2016 0838   GFRNONAA 56 (L) 06/12/2016 0540   GFRAA >60 06/12/2016 0540    No results found for: SPEP, UPEP  Lab Results  Component Value Date   WBC 2.2 (L) 10/02/2016   NEUTROABS 1.4 (L) 10/02/2016   HGB 8.6 (L) 10/02/2016   HCT 24.8 (L) 10/02/2016   MCV 85.3 10/02/2016   PLT 99 large plts seen (L) 10/02/2016      Chemistry      Component Value Date/Time   NA 139 10/02/2016 0838   K 3.7 10/02/2016 0838   CL 101 06/12/2016 0540   CO2 28 10/02/2016 0838   BUN 19.0 10/02/2016 0838   CREATININE 1.1 10/02/2016 0838      Component Value Date/Time   CALCIUM 9.5 10/02/2016 0838   ALKPHOS 89 10/02/2016 0838   AST 19 10/02/2016 0838   ALT 12 10/02/2016 0838   BILITOT 0.49 10/02/2016 0838       RADIOGRAPHIC STUDIES: I have personally reviewed the radiological images as listed and agreed with the findings in the report. 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 Patient status post chemotherapy. Radiation therapy completed 04/30/2016. EXAM: NUCLEAR MEDICINE PET SKULL BASE TO THIGH TECHNIQUE: 13.3 mCi F-18 FDG was injected intravenously. Full-ring PET imaging was performed from the skull base to thigh after the radiotracer. CT data was obtained and used for attenuation correction and anatomic localization. FASTING BLOOD GLUCOSE:  Value: 90 mg/dl COMPARISON:  PET-CT 8228 FINDINGS: NECK Intense activity in the for the RIGHT floor of the mouth along the inferior margin of the mandible is decreased in geographic size and metabolic activity with SUV max equals 6.7 decreased from 13.3. Intense activity in the LEFT base of tongue is also decreased SUV max  equals 7.4 compared to 12.0. T Here is reduction in metabolic activity of RIGHT level 2 lymph node is well reduction size (image 256 of the fused data set). Linear activity in the RIGHT neck musculature consistent physiologic activity. CHEST Rounded consolidation in the LEFT and RIGHT upper lobe similar comparison exam. Focus in the RIGHT upper lobe  is increased in size measuring 27 mm compared to 15 mm but but unchanged in metabolic activity with SUV max equal 4.1 compared to 4.7. Lesion in the LEFT upper lobe similar in size measuring 18 mm compared to 20 mm but with reduction in metabolic activity SUV max 2.7 decreased from 5.3. No new foci noted. No hypermetabolic mediastinal lymph nodes. ABDOMEN/PELVIS Metabolic activity along the gastrostomy tract is favored benign. No abnormal metabolic activity liver. No hypermetabolic abdominopelvic lymph nodes. Uterus and ovaries normal. SKELETON No focal hypermetabolic activity to suggest skeletal metastasis. IMPRESSION: 1. Decrease in metabolic activity of LEFT base of tongue lesion. 2. Decrease in size and metabolic activity of right submental adenopathy/malignancy 3. Reduction in metabolic activity of RIGHT level 2 lymph node. 4. No evidence of metastatic progression chest, abdomen, pelvis. 5. Persistent bilateral upper lobe foci of consolidation. Favored inflammatory or infectious etiology and consider adverse drug reaction. Electronically Signed   By: Suzy Bouchard M.D.   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[place]) I have reviewed the PET CT scan with the patient and her husband That are still activity indicating residual disease but she is responding to treatment I plan to make minor dose adjustment to chemotherapy Even though her skin is healing, she still has significant skin rash from cetuximab I plan to hold cetuximab tomorrow and restart next week if possible I will get advance home care team to see her  on a weekly basis on Wednesday to see if she would need support The plan would be to complete minimum 6 cycles of chemotherapy and then switch over to maintenance treatment The patient understood that she will remain on treatment for the rest of her life I will get help to get disability paperwork faxed to her workplace and certify her permanently disabled from her cancer and she will unlikely be able to return back to work  Pancytopenia, acquired [facility]) She has acquired pancytopenia, not unexpected given recent chemotherapy She does not need G-CSF support for now The patient declined transfusion support for religious reasons Will monitor closely I plan to reduce a dose of chemotherapy for future treatment  Acneiform drug eruption Skin is better since holding treatment last week I plan to resume cetuximab next week with dose adjustment  Acquired lymphedema She has persistent lymphedema around her neck Will observe for now  Insomnia disorder She has significant insomnia When I reviewed her medications, it appears that she is not taking her medications consistently I recommend she takes lorazepam as prescribed, Remeron and Zyprexa If she is indeed taking all 3 medications consistently, I plan to increase Remeron first  Cancer associated pain We discussed pain management extensively She declined addition of fentanyl patch We will continue liquid Dilaudid  Physical debility The patient had significant problems with side effects to treatment in the past, including uncontrolled nausea, vomiting and dehydration Due to potential immunocompromise state, I would  recommend home health agency to schedule IV fluids if needed and to get pump disconnect at home I explained to the patient why this is necessary and I agree with the plan of care   No orders of the defined types were placed in this encounter.  All questions were answered. The patient knows to call the clinic with any problems,  questions or concerns. No barriers to learning was detected. I spent 30 minutes counseling the patient face to face. The total time spent in the appointment was 5  minutes and more than 50% was on counseling and review of test results     [name], MD 10/08/2016 3:03 PM

## 2016-10-08 NOTE — Assessment & Plan Note (Signed)
She has significant insomnia When I reviewed her medications, it appears that she is not taking her medications consistently I recommend she takes lorazepam as prescribed, Remeron and Zyprexa If she is indeed taking all 3 medications consistently, I plan to increase Remeron first

## 2016-10-08 NOTE — Telephone Encounter (Signed)
Scheduled appt per 10/8 los - patient to get an updated schedule tomorrow when she comes in for her treatment . Patient was in scheduling but unable to get schedule at the moment- technical issues-  said it was okay to call. Left message with appt time and date.

## 2016-10-08 NOTE — Assessment & Plan Note (Signed)
She has persistent lymphedema around her neck Will observe for now

## 2016-10-08 NOTE — Assessment & Plan Note (Signed)
She has acquired pancytopenia, not unexpected given recent chemotherapy She does not need G-CSF support for now The patient declined transfusion support for religious reasons Will monitor closely I plan to reduce a dose of chemotherapy for future treatment

## 2016-10-08 NOTE — Assessment & Plan Note (Signed)
I have reviewed the PET CT scan with the patient and her husband That are still activity indicating residual disease but she is responding to treatment I plan to make minor dose adjustment to chemotherapy Even though her skin is healing, she still has significant skin rash from cetuximab I plan to hold cetuximab tomorrow and restart next week if possible I will get advance home care team to see her on a weekly basis on Wednesday to see if she would need support The plan would be to complete minimum 6 cycles of chemotherapy and then switch over to maintenance treatment The patient understood that she will remain on treatment for the rest of her life I will get help to get disability paperwork faxed to her workplace and certify her permanently disabled from her cancer and she will unlikely be able to return back to work

## 2016-10-08 NOTE — Assessment & Plan Note (Signed)
Skin is better since holding treatment last week I plan to resume cetuximab next week with dose adjustment

## 2016-10-08 NOTE — Assessment & Plan Note (Signed)
We discussed pain management extensively She declined addition of fentanyl patch We will continue liquid Dilaudid

## 2016-10-09 ENCOUNTER — Ambulatory Visit: Payer: 59

## 2016-10-09 ENCOUNTER — Other Ambulatory Visit: Payer: Self-pay | Admitting: Hematology and Oncology

## 2016-10-09 ENCOUNTER — Other Ambulatory Visit (HOSPITAL_BASED_OUTPATIENT_CLINIC_OR_DEPARTMENT_OTHER): Payer: 59

## 2016-10-09 ENCOUNTER — Telehealth: Payer: Self-pay | Admitting: *Deleted

## 2016-10-09 ENCOUNTER — Ambulatory Visit (HOSPITAL_BASED_OUTPATIENT_CLINIC_OR_DEPARTMENT_OTHER): Payer: 59

## 2016-10-09 VITALS — BP 128/71 | HR 100 | Temp 98.7°F | Resp 19

## 2016-10-09 DIAGNOSIS — C023 Malignant neoplasm of anterior two-thirds of tongue, part unspecified: Secondary | ICD-10-CM | POA: Diagnosis not present

## 2016-10-09 DIAGNOSIS — Z5111 Encounter for antineoplastic chemotherapy: Secondary | ICD-10-CM | POA: Diagnosis not present

## 2016-10-09 LAB — COMPREHENSIVE METABOLIC PANEL
ALBUMIN: 3.3 g/dL — AB (ref 3.5–5.0)
ALK PHOS: 94 U/L (ref 40–150)
ALT: 10 U/L (ref 0–55)
AST: 18 U/L (ref 5–34)
Anion Gap: 8 mEq/L (ref 3–11)
BUN: 17 mg/dL (ref 7.0–26.0)
CALCIUM: 9.7 mg/dL (ref 8.4–10.4)
CHLORIDE: 105 meq/L (ref 98–109)
CO2: 27 mEq/L (ref 22–29)
CREATININE: 1 mg/dL (ref 0.6–1.1)
EGFR: 71 mL/min/{1.73_m2} — ABNORMAL LOW (ref >90)
GLUCOSE: 109 mg/dL (ref 70–140)
POTASSIUM: 3.9 meq/L (ref 3.5–5.1)
SODIUM: 141 meq/L (ref 136–145)
Total Bilirubin: 0.33 mg/dL (ref 0.20–1.20)
Total Protein: 7.4 g/dL (ref 6.4–8.3)

## 2016-10-09 LAB — CBC WITH DIFFERENTIAL/PLATELET
BASO%: 0.5 % (ref 0.0–2.0)
BASOS ABS: 0 10*3/uL (ref 0.0–0.1)
EOS%: 4.8 % (ref 0.0–7.0)
Eosinophils Absolute: 0.1 10*3/uL (ref 0.0–0.5)
HEMATOCRIT: 25.3 % — AB (ref 34.8–46.6)
HEMOGLOBIN: 8.7 g/dL — AB (ref 11.6–15.9)
LYMPH#: 0.4 10*3/uL — AB (ref 0.9–3.3)
LYMPH%: 20.1 % (ref 14.0–49.7)
MCH: 29 pg (ref 25.1–34.0)
MCHC: 34.4 g/dL (ref 31.5–36.0)
MCV: 84.3 fL (ref 79.5–101.0)
MONO#: 0.3 10*3/uL (ref 0.1–0.9)
MONO%: 12.4 % (ref 0.0–14.0)
NEUT#: 1.3 10*3/uL — ABNORMAL LOW (ref 1.5–6.5)
NEUT%: 62.2 % (ref 38.4–76.8)
Platelets: 103 10*3/uL — ABNORMAL LOW (ref 145–400)
RBC: 3 10*6/uL — ABNORMAL LOW (ref 3.70–5.45)
RDW: 18 % — ABNORMAL HIGH (ref 11.2–14.5)
WBC: 2.1 10*3/uL — ABNORMAL LOW (ref 3.9–10.3)
nRBC: 0 % (ref 0–0)

## 2016-10-09 LAB — MAGNESIUM: MAGNESIUM: 1.9 mg/dL (ref 1.5–2.5)

## 2016-10-09 MED ORDER — PALONOSETRON HCL INJECTION 0.25 MG/5ML
INTRAVENOUS | Status: AC
  Filled 2016-10-09: qty 5

## 2016-10-09 MED ORDER — DEXAMETHASONE SODIUM PHOSPHATE 10 MG/ML IJ SOLN
INTRAMUSCULAR | Status: AC
  Filled 2016-10-09: qty 1

## 2016-10-09 MED ORDER — DEXAMETHASONE SODIUM PHOSPHATE 10 MG/ML IJ SOLN
10.0000 mg | Freq: Once | INTRAMUSCULAR | Status: AC
  Administered 2016-10-09: 10 mg via INTRAVENOUS

## 2016-10-09 MED ORDER — SODIUM CHLORIDE 0.9 % IV SOLN
Freq: Once | INTRAVENOUS | Status: AC
  Administered 2016-10-09: 11:00:00 via INTRAVENOUS

## 2016-10-09 MED ORDER — PALONOSETRON HCL INJECTION 0.25 MG/5ML
0.2500 mg | Freq: Once | INTRAVENOUS | Status: AC
  Administered 2016-10-09: 0.25 mg via INTRAVENOUS

## 2016-10-09 MED ORDER — SODIUM CHLORIDE 0.9 % IV SOLN
800.0000 mg/m2/d | INTRAVENOUS | Status: DC
  Administered 2016-10-09: 6350 mg via INTRAVENOUS
  Filled 2016-10-09: qty 127

## 2016-10-09 MED ORDER — CARBOPLATIN CHEMO INJECTION 450 MG/45ML
356.0000 mg | Freq: Once | INTRAVENOUS | Status: AC
  Administered 2016-10-09: 360 mg via INTRAVENOUS
  Filled 2016-10-09: qty 36

## 2016-10-09 NOTE — Progress Notes (Signed)
Nutrition Follow-up:  Patient with tongue cancer followed by Dr. Alvy Bimler.  Met with patient during infusion today.  Patient reports that she is taking 7 cans of vital 1.5 each day.  Reports diarrhea about 2 times per day but she thinks this is from antibiotic that she has been taking.  Reports that she is taking imodium to help with diarrhea.  Reports that she is taking 1 syringe full of water before and after tube feeding.  Reports nausea is unchanged.  Reports that she has been receiving home IV fluids daily.  Noted per MD note looks like weekly IV fluids??  Medications: reviewed  Labs: reviewed  Anthropometrics:   Weight has decreased to 176 lb on 10/8 from 175 lb 9.6 oz on 9/11. Noted weight has been in the 180s since May 2018.  Estimated Energy Needs  Kcals: 7837-5423 calories/d Protein: 120-135 g/kg Fluid: 2.5 L/d  NUTRITION DIAGNOSIS: Unintentional weight loss continues   MALNUTRITION DIAGNOSIS: severe malnutrition continues   INTERVENTION:   Encouraged patient to continue vital 1.5, 7 cans per day via PEG tube.  Written instructions given to patient to provide 1 1/2 cans at first 2 feedings of the day and 4 remaining feedings to provide 1 can.  Patient will need to flush with 3m of water before and after each feeding (6 per day).  Patient will need additional 2437mof water BID for better hydration.  Written instructions reviewed with patient and questions answered.  Patient does not want to use continuous pump for enteral nutrition as has done this in the past and does not like it.      MONITORING, EVALUATION, GOAL: patient will tolerate feeding to meet greater than 90% of estimated nutrition needs.   NEXT VISIT: as needed  Kunta Hilleary B. AlZenia ResidesRDLondonLDDerbyegistered Dietitian [phone])

## 2016-10-09 NOTE — Telephone Encounter (Signed)
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  Requesting her records from Dr. Alvy Bimler for this claim.  Call [phone] if any questions.  Fax records to [phone]."

## 2016-10-09 NOTE — Patient Instructions (Signed)
[facility] Discharge Instructions for Patients Receiving Chemotherapy  Today you received the following chemotherapy agents Carboplatin and adrucil  To help prevent nausea and vomiting after your treatment, we encourage you to take your nausea medication as directed  you develop nausea and vomiting that is not controlled by your nausea medication, call the clinic.   BELOW ARE SYMPTOMS THAT SHOULD BE REPORTED IMMEDIATELY:  *FEVER GREATER THAN 100.5 F  *CHILLS WITH OR WITHOUT FEVER  NAUSEA AND VOMITING THAT IS NOT CONTROLLED WITH YOUR NAUSEA MEDICATION  *UNUSUAL SHORTNESS OF BREATH  *UNUSUAL BRUISING OR BLEEDING  TENDERNESS IN MOUTH AND THROAT WITH OR WITHOUT PRESENCE OF ULCERS  *URINARY PROBLEMS  *BOWEL PROBLEMS  UNUSUAL RASH Items with * indicate a potential emergency and should be followed up as soon as possible.  Feel free to call the clinic should you have any questions or concerns. The clinic phone number is [phone].  Please show the [facility] at check-in to the Emergency Department and triage nurse.

## 2016-10-10 ENCOUNTER — Other Ambulatory Visit: Payer: Self-pay | Admitting: Hematology and Oncology

## 2016-10-10 DIAGNOSIS — C023 Malignant neoplasm of anterior two-thirds of tongue, part unspecified: Secondary | ICD-10-CM

## 2016-10-10 MED FILL — PROCHLORPERAZINE 10 MG TAB: 10 | 7 days supply | Qty: 30 | Fill #0

## 2016-10-10 MED FILL — MIRTAZAPINE 15 MG ODT: 15 | 30 days supply | Qty: 30 | Fill #2

## 2016-10-10 NOTE — Telephone Encounter (Signed)
Tammi, please check to see if this has already been accomplished

## 2016-10-10 NOTE — Telephone Encounter (Signed)
RN called [employer] to see if they have received requested information. They state they have not received anything.   RN sent message to [facility] HIM for follow up. Papers were taken to HIM on Friday and Monday- requested urgent follow up per patient.

## 2016-10-16 ENCOUNTER — Other Ambulatory Visit: Payer: Self-pay | Admitting: Hematology and Oncology

## 2016-10-16 ENCOUNTER — Ambulatory Visit (HOSPITAL_BASED_OUTPATIENT_CLINIC_OR_DEPARTMENT_OTHER): Payer: 59

## 2016-10-16 ENCOUNTER — Other Ambulatory Visit (HOSPITAL_BASED_OUTPATIENT_CLINIC_OR_DEPARTMENT_OTHER): Payer: 59

## 2016-10-16 ENCOUNTER — Ambulatory Visit: Payer: 59

## 2016-10-16 VITALS — BP 108/64 | HR 92 | Temp 99.6°F | Resp 18

## 2016-10-16 DIAGNOSIS — D539 Nutritional anemia, unspecified: Secondary | ICD-10-CM

## 2016-10-16 DIAGNOSIS — Z5112 Encounter for antineoplastic immunotherapy: Secondary | ICD-10-CM | POA: Diagnosis not present

## 2016-10-16 DIAGNOSIS — C023 Malignant neoplasm of anterior two-thirds of tongue, part unspecified: Secondary | ICD-10-CM | POA: Diagnosis not present

## 2016-10-16 LAB — CBC WITH DIFFERENTIAL/PLATELET
BASO%: 0.6 % (ref 0.0–2.0)
Basophils Absolute: 0 10*3/uL (ref 0.0–0.1)
EOS%: 4.7 % (ref 0.0–7.0)
Eosinophils Absolute: 0.1 10*3/uL (ref 0.0–0.5)
HCT: 22.2 % — ABNORMAL LOW (ref 34.8–46.6)
HGB: 7.8 g/dL — ABNORMAL LOW (ref 11.6–15.9)
LYMPH%: 10.8 % — AB (ref 14.0–49.7)
MCH: 30 pg (ref 25.1–34.0)
MCHC: 35 g/dL (ref 31.5–36.0)
MCV: 85.8 fL (ref 79.5–101.0)
MONO#: 0.1 10*3/uL (ref 0.1–0.9)
MONO%: 4.7 % (ref 0.0–14.0)
NEUT%: 79.2 % — ABNORMAL HIGH (ref 38.4–76.8)
NEUTROS ABS: 2 10*3/uL (ref 1.5–6.5)
Platelets: 281 10*3/uL (ref 145–400)
RBC: 2.59 10*6/uL — AB (ref 3.70–5.45)
RDW: 15.8 % — ABNORMAL HIGH (ref 11.2–14.5)
WBC: 2.5 10*3/uL — AB (ref 3.9–10.3)
lymph#: 0.3 10*3/uL — ABNORMAL LOW (ref 0.9–3.3)

## 2016-10-16 LAB — COMPREHENSIVE METABOLIC PANEL
ALT: 13 U/L (ref 0–55)
ANION GAP: 9 meq/L (ref 3–11)
AST: 20 U/L (ref 5–34)
Albumin: 3.3 g/dL — ABNORMAL LOW (ref 3.5–5.0)
Alkaline Phosphatase: 85 U/L (ref 40–150)
BILIRUBIN TOTAL: 0.54 mg/dL (ref 0.20–1.20)
BUN: 23.8 mg/dL (ref 7.0–26.0)
CALCIUM: 9.2 mg/dL (ref 8.4–10.4)
CO2: 25 mEq/L (ref 22–29)
CREATININE: 1.1 mg/dL (ref 0.6–1.1)
Chloride: 104 mEq/L (ref 98–109)
EGFR: >60 mL/min/{1.73_m2} (ref >60)
Glucose: 114 mg/dl (ref 70–140)
Potassium: 3.8 mEq/L (ref 3.5–5.1)
Sodium: 138 mEq/L (ref 136–145)
TOTAL PROTEIN: 7.1 g/dL (ref 6.4–8.3)

## 2016-10-16 LAB — FERRITIN: FERRITIN: 798 ng/mL — AB (ref 9–269)

## 2016-10-16 LAB — IRON AND TIBC
%SAT: 43 % (ref 21–57)
IRON: 100 ug/dL (ref 41–142)
TIBC: 234 ug/dL — AB (ref 236–444)
UIBC: 134 ug/dL (ref 120–384)

## 2016-10-16 LAB — MAGNESIUM: MAGNESIUM: 1.7 mg/dL (ref 1.5–2.5)

## 2016-10-16 MED ORDER — ONDANSETRON HCL 4 MG/2ML IJ SOLN
INTRAMUSCULAR | Status: AC
  Filled 2016-10-16: qty 2

## 2016-10-16 MED ORDER — DIPHENHYDRAMINE HCL 50 MG/ML IJ SOLN
25.0000 mg | Freq: Once | INTRAMUSCULAR | Status: AC
Start: 2016-10-16 — End: 2016-10-16
  Administered 2016-10-16: 25 mg via INTRAVENOUS

## 2016-10-16 MED ORDER — FAMOTIDINE IN NACL 20-0.9 MG/50ML-% IV SOLN
20.0000 mg | Freq: Once | INTRAVENOUS | Status: AC
  Administered 2016-10-16: 20 mg via INTRAVENOUS

## 2016-10-16 MED ORDER — SODIUM CHLORIDE 0.9% FLUSH
10.0000 mL | Freq: Once | INTRAVENOUS | Status: AC
  Administered 2016-10-16: 10 mL
  Filled 2016-10-16: qty 10

## 2016-10-16 MED ORDER — HEPARIN SOD (PORK) LOCK FLUSH 100 UNIT/ML IV SOLN
500.0000 [IU] | Freq: Once | INTRAVENOUS | Status: AC | PRN
  Administered 2016-10-16: 500 [IU]
  Filled 2016-10-16: qty 5

## 2016-10-16 MED ORDER — DIPHENHYDRAMINE HCL 50 MG/ML IJ SOLN
INTRAMUSCULAR | Status: AC
  Filled 2016-10-16: qty 1

## 2016-10-16 MED ORDER — CETUXIMAB CHEMO IV INJECTION 200 MG/100ML
83.3333 mg/m2 | Freq: Once | INTRAVENOUS | Status: AC
  Administered 2016-10-16: 200 mg via INTRAVENOUS
  Filled 2016-10-16: qty 100

## 2016-10-16 MED ORDER — ONDANSETRON HCL 4 MG/2ML IJ SOLN
8.0000 mg | Freq: Once | INTRAMUSCULAR | Status: AC
  Administered 2016-10-16: 8 mg via INTRAVENOUS

## 2016-10-16 MED ORDER — ONDANSETRON HCL 4 MG/2ML IJ SOLN
INTRAMUSCULAR | Status: AC
Start: 2016-10-16 — End: 2016-10-16
  Filled 2016-10-16: qty 2

## 2016-10-16 MED ORDER — FAMOTIDINE IN NACL 20-0.9 MG/50ML-% IV SOLN
INTRAVENOUS | Status: AC
  Filled 2016-10-16: qty 50

## 2016-10-16 MED ORDER — SODIUM CHLORIDE 0.9 % IV SOLN
Freq: Once | INTRAVENOUS | Status: AC
  Administered 2016-10-16: 13:00:00 via INTRAVENOUS

## 2016-10-16 MED ORDER — SODIUM CHLORIDE 0.9% FLUSH
10.0000 mL | INTRAVENOUS | Status: DC | PRN
  Administered 2016-10-16: 10 mL
  Filled 2016-10-16: qty 10

## 2016-10-16 NOTE — Progress Notes (Signed)
Per Dr. Alvy Bimler, ok to proceed with Erbitux tx with Hbg 7.8, Temp 99.25F, HR 110, and patient's report of chills. Smith Mince, BSN, RN

## 2016-10-16 NOTE — Progress Notes (Signed)
Pt tolerated infusion well, pt monitored 1 hour post infusion. Pt and VS stable at discharge.

## 2016-10-16 NOTE — Patient Instructions (Signed)
[facility] Discharge Instructions for Patients Receiving Chemotherapy  Today you received the following chemotherapy agents Erbitux  To help prevent nausea and vomiting after your treatment, we encourage you to take your nausea medication as directed   If you develop nausea and vomiting that is not controlled by your nausea medication, call the clinic.   BELOW ARE SYMPTOMS THAT SHOULD BE REPORTED IMMEDIATELY:  *FEVER GREATER THAN 100.5 F  *CHILLS WITH OR WITHOUT FEVER  NAUSEA AND VOMITING THAT IS NOT CONTROLLED WITH YOUR NAUSEA MEDICATION  *UNUSUAL SHORTNESS OF BREATH  *UNUSUAL BRUISING OR BLEEDING  TENDERNESS IN MOUTH AND THROAT WITH OR WITHOUT PRESENCE OF ULCERS  *URINARY PROBLEMS  *BOWEL PROBLEMS  UNUSUAL RASH Items with * indicate a potential emergency and should be followed up as soon as possible.  Feel free to call the clinic should you have any questions or concerns. The clinic phone number is [phone].  Please show the CHEMO ALERT CARD at check-in to the Emergency Department and triage nurse.   

## 2016-10-17 LAB — VITAMIN B12: VITAMIN B 12: 655 pg/mL (ref 232–1245)

## 2016-10-17 LAB — FOLATE RBC
Folate, Hemolysate: 361.1 ng/mL
Hematocrit: 20.4 % — ABNORMAL LOW (ref 34.0–46.6)

## 2016-10-17 LAB — SEDIMENTATION RATE: SED RATE: 75 mm/h — AB (ref 0–40)

## 2016-10-17 LAB — ERYTHROPOIETIN: Erythropoietin: 281.1 m[IU]/mL — ABNORMAL HIGH (ref 2.6–18.5)

## 2016-10-22 ENCOUNTER — Other Ambulatory Visit: Payer: Self-pay | Admitting: Hematology and Oncology

## 2016-10-22 MED FILL — OLANZapine 5 MG TABS: 5 | 30 days supply | Qty: 30 | Fill #4

## 2016-10-22 MED FILL — PROCHLORPERAZINE 10 MG TAB: 10 | 7 days supply | Qty: 30 | Fill #1

## 2016-10-23 ENCOUNTER — Ambulatory Visit (HOSPITAL_BASED_OUTPATIENT_CLINIC_OR_DEPARTMENT_OTHER): Payer: 59

## 2016-10-23 ENCOUNTER — Telehealth: Payer: Self-pay

## 2016-10-23 ENCOUNTER — Ambulatory Visit (HOSPITAL_BASED_OUTPATIENT_CLINIC_OR_DEPARTMENT_OTHER): Payer: 59 | Admitting: Hematology and Oncology

## 2016-10-23 ENCOUNTER — Other Ambulatory Visit: Payer: Self-pay

## 2016-10-23 ENCOUNTER — Other Ambulatory Visit (HOSPITAL_BASED_OUTPATIENT_CLINIC_OR_DEPARTMENT_OTHER): Payer: 59

## 2016-10-23 ENCOUNTER — Other Ambulatory Visit: Payer: Self-pay | Admitting: Hematology and Oncology

## 2016-10-23 ENCOUNTER — Ambulatory Visit: Payer: 59

## 2016-10-23 ENCOUNTER — Encounter: Payer: Self-pay | Admitting: Hematology and Oncology

## 2016-10-23 VITALS — BP 115/79 | HR 114 | Temp 98.9°F | Resp 20 | Ht 68.0 in | Wt 174.3 lb

## 2016-10-23 DIAGNOSIS — D631 Anemia in chronic kidney disease: Secondary | ICD-10-CM

## 2016-10-23 DIAGNOSIS — C023 Malignant neoplasm of anterior two-thirds of tongue, part unspecified: Secondary | ICD-10-CM

## 2016-10-23 DIAGNOSIS — E860 Dehydration: Secondary | ICD-10-CM | POA: Diagnosis not present

## 2016-10-23 DIAGNOSIS — G893 Neoplasm related pain (acute) (chronic): Secondary | ICD-10-CM | POA: Diagnosis not present

## 2016-10-23 DIAGNOSIS — D61818 Other pancytopenia: Secondary | ICD-10-CM | POA: Diagnosis not present

## 2016-10-23 DIAGNOSIS — N189 Chronic kidney disease, unspecified: Secondary | ICD-10-CM | POA: Diagnosis not present

## 2016-10-23 DIAGNOSIS — L270 Generalized skin eruption due to drugs and medicaments taken internally: Secondary | ICD-10-CM | POA: Diagnosis not present

## 2016-10-23 DIAGNOSIS — D539 Nutritional anemia, unspecified: Secondary | ICD-10-CM

## 2016-10-23 LAB — COMPREHENSIVE METABOLIC PANEL
ALBUMIN: 3.4 g/dL — AB (ref 3.5–5.0)
ALK PHOS: 89 U/L (ref 40–150)
ALT: 10 U/L (ref 0–55)
ANION GAP: 10 meq/L (ref 3–11)
AST: 20 U/L (ref 5–34)
BUN: 18 mg/dL (ref 7.0–26.0)
CALCIUM: 9.5 mg/dL (ref 8.4–10.4)
CHLORIDE: 104 meq/L (ref 98–109)
CO2: 25 mEq/L (ref 22–29)
CREATININE: 1.1 mg/dL (ref 0.6–1.1)
EGFR: >60 mL/min/{1.73_m2} (ref >60)
Glucose: 104 mg/dl (ref 70–140)
POTASSIUM: 3.5 meq/L (ref 3.5–5.1)
Sodium: 140 mEq/L (ref 136–145)
Total Bilirubin: 0.52 mg/dL (ref 0.20–1.20)
Total Protein: 7.2 g/dL (ref 6.4–8.3)

## 2016-10-23 LAB — CBC WITH DIFFERENTIAL/PLATELET
BASO%: 0.5 % (ref 0.0–2.0)
Basophils Absolute: 0 10*3/uL (ref 0.0–0.1)
EOS%: 1.3 % (ref 0.0–7.0)
Eosinophils Absolute: 0 10*3/uL (ref 0.0–0.5)
HEMATOCRIT: 20 % — AB (ref 34.8–46.6)
HGB: 7 g/dL — ABNORMAL LOW (ref 11.6–15.9)
LYMPH#: 0.4 10*3/uL — AB (ref 0.9–3.3)
LYMPH%: 24 % (ref 14.0–49.7)
MCH: 30.3 pg (ref 25.1–34.0)
MCHC: 34.8 g/dL (ref 31.5–36.0)
MCV: 86.9 fL (ref 79.5–101.0)
MONO#: 0.3 10*3/uL (ref 0.1–0.9)
MONO%: 19.6 % — ABNORMAL HIGH (ref 0.0–14.0)
NEUT%: 54.6 % (ref 38.4–76.8)
NEUTROS ABS: 0.8 10*3/uL — AB (ref 1.5–6.5)
Platelets: 95 10*3/uL — ABNORMAL LOW (ref 145–400)
RBC: 2.3 10*6/uL — ABNORMAL LOW (ref 3.70–5.45)
RDW: 18.7 % — ABNORMAL HIGH (ref 11.2–14.5)
WBC: 1.5 10*3/uL — AB (ref 3.9–10.3)

## 2016-10-23 LAB — MAGNESIUM: MAGNESIUM: 1.6 mg/dL (ref 1.5–2.5)

## 2016-10-23 MED ORDER — DARBEPOETIN ALFA 200 MCG/0.4ML IJ SOSY
200.0000 ug | PREFILLED_SYRINGE | Freq: Once | INTRAMUSCULAR | Status: AC
  Administered 2016-10-23: 200 ug via SUBCUTANEOUS
  Filled 2016-10-23: qty 0.4

## 2016-10-23 MED ORDER — LORAZEPAM 1 MG PO TABS: 1.0000 mg | ORAL_TABLET | Freq: Three times a day (TID) | ORAL | 0 refills | Status: DC | PRN

## 2016-10-23 MED ORDER — SODIUM CHLORIDE 0.9% FLUSH
10.0000 mL | Freq: Once | INTRAVENOUS | Status: AC
  Administered 2016-10-23: 10 mL
  Filled 2016-10-23: qty 10

## 2016-10-23 MED ORDER — HYDROMORPHONE HCL 1 MG/ML PO LIQD: 6.0000 mg | ORAL | 0 refills | Status: DC | PRN

## 2016-10-23 MED FILL — HYDROMORPHONE 5 MG/5 ML SOL: 1 | 9 days supply | Qty: 473 | Fill #0

## 2016-10-23 NOTE — Assessment & Plan Note (Signed)
She is clinically dehydrated She will continue aggressive daily IV fluid support through advance home care service

## 2016-10-23 NOTE — Assessment & Plan Note (Signed)
She is developing severe pancytopenia The patient has declined blood transfusion due to religious reasons even though she is symptomatic from anemia We discussed some of the risks, benefits, and alternatives of erythropoietin stimulating agents such as Procrit or Aranesp. The patient is symptomatic from anemia and the EPO level is low. Some of the side-effects to be expected including risks of allergic reactions, skin rashes, headaches, risk of blood clots including heart attack and stroke. There is rare risks of causing growth of cancers.The patient is willing to proceed From the leukopenia and thrombocytopenia standpoint, she does not need further treatment for those

## 2016-10-23 NOTE — Assessment & Plan Note (Signed)
Skin is better since holding treatment recently However, due to pancytopenia, I would delay her treatment further

## 2016-10-23 NOTE — Patient Instructions (Signed)
Implanted Port Home Guide An implanted port is a type of central line that is placed under the skin. Central lines are used to provide IV access when treatment or nutrition needs to be given through a person's veins. Implanted ports are used for long-term IV access. An implanted port may be placed because:  You need IV medicine that would be irritating to the small veins in your hands or arms.  You need long-term IV medicines, such as antibiotics.  You need IV nutrition for a long period.  You need frequent blood draws for lab tests.  You need dialysis.  Implanted ports are usually placed in the chest area, but they can also be placed in the upper arm, the abdomen, or the leg. An implanted port has two main parts:  Reservoir. The reservoir is round and will appear as a small, raised area under your skin. The reservoir is the part where a needle is inserted to give medicines or draw blood.  Catheter. The catheter is a thin, flexible tube that extends from the reservoir. The catheter is placed into a large vein. Medicine that is inserted into the reservoir goes into the catheter and then into the vein.  How will I care for my incision site? Do not get the incision site wet. Bathe or shower as directed by your health care provider. How is my port accessed? Special steps must be taken to access the port:  Before the port is accessed, a numbing cream can be placed on the skin. This helps numb the skin over the port site.  Your health care provider uses a sterile technique to access the port. ? Your health care provider must put on a mask and sterile gloves. ? The skin over your port is cleaned carefully with an antiseptic and allowed to dry. ? The port is gently pinched between sterile gloves, and a needle is inserted into the port.  Only "non-coring" port needles should be used to access the port. Once the port is accessed, a blood return should be checked. This helps ensure that the port  is in the vein and is not clogged.  If your port needs to remain accessed for a constant infusion, a clear (transparent) bandage will be placed over the needle site. The bandage and needle will need to be changed every week, or as directed by your health care provider.  Keep the bandage covering the needle clean and dry. Do not get it wet. Follow your health care provider's instructions on how to take a shower or bath while the port is accessed.  If your port does not need to stay accessed, no bandage is needed over the port.  What is flushing? Flushing helps keep the port from getting clogged. Follow your health care provider's instructions on how and when to flush the port. Ports are usually flushed with saline solution or a medicine called heparin. The need for flushing will depend on how the port is used.  If the port is used for intermittent medicines or blood draws, the port will need to be flushed: ? After medicines have been given. ? After blood has been drawn. ? As part of routine maintenance.  If a constant infusion is running, the port may not need to be flushed.  How long will my port stay implanted? The port can stay in for as long as your health care provider thinks it is needed. When it is time for the port to come out, surgery will be   done to remove it. The procedure is similar to the one performed when the port was put in. When should I seek immediate medical care? When you have an implanted port, you should seek immediate medical care if:  You notice a bad smell coming from the incision site.  You have swelling, redness, or drainage at the incision site.  You have more swelling or pain at the port site or the surrounding area.  You have a fever that is not controlled with medicine.  This information is not intended to replace advice given to you by your health care provider. Make sure you discuss any questions you have with your health care provider. Document  Released: 12/18/2004 Document Revised: 05/26/2015 Document Reviewed: 08/25/2012 Elsevier Interactive Patient Education  2017 [employer]  

## 2016-10-23 NOTE — Telephone Encounter (Signed)
Printed avs and calender for upcoming appointment. Per 10/23 los 

## 2016-10-23 NOTE — Assessment & Plan Note (Signed)
We discussed pain management extensively She declined addition of fentanyl patch We will continue liquid Dilaudid

## 2016-10-23 NOTE — Progress Notes (Signed)
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 Tongue, biopsy, Anterior deep margin left SQUAMOUS CELL CARCINOMA 3. Tongue, biopsy, Posterior deep margin left NEGATIVE FOR CARCINOMA 4. Tongue, biopsy, Posterior margin left SQUAMOUS CELL CARCINOMA 5. Tongue, excisional biopsy, New anterior deep margin NEGATIVE FOR CARCINOMA 6. Tongue, excisional biopsy, New posterior deep margin SMALL FOCUS OF SQUAMOUS CELL CARCINOMA (0.1 CM, ONLY PRESENT AT THE DEEPER PERMINENT SECTION) 7. Tongue, resection for tumor, Left lateral INFILTRATIVE KERATINIZED SQUAMOUS CELL CARCINOMA (3.2 CM) THE TUMOR INVADES UNDERNEATH MUSCLE OF THE TONGUE (2.0 CM, PT2) LYMPHOVASCULAR AND PERINEURAL INVASION IDENTIFIED SQUAMOUS CELL CARCINOMA PRESENTED AT (FINAL MARGINS REFER TO PART 2-6) LATERAL INKED MARGIN (1.0 CM) MEDIAL INKED MARGIN (0.6 CM)      02/08/2016 Surgery    SURGICAL PROCEDURES: 1. Left hemiglossectomy with primary closure. 2. Left selective neck dissection (zones I-III).      03/02/2016 Imaging    CT chest with contrast: No evidence of metastatic disease in the chest. 2. Aortic atherosclerosis.       03/02/2016 Imaging    CT neck with contrast showed : Newly enlarged and round 10 mm  right level 1b lymph node (series 5, image 45). The level 1A nodes remain normal. This right 1 B node is nonspecific but suspicious for progressive nodal metastatic disease. It might be amenable to Ultrasound-guided FNA. 2. Interval left hemiglossectomy and selective left neck dissection with confluent indeterminate soft tissue from the anterior left submandibular space through the left carotid space and obscuring the left level 1B, level 2, and level IIIa nodal stations. This study will serve as a new postoperative baseline of this area. Attention directed on close interval follow-up. 3. Mild soft tissue thickening also along the left lateral tongue resection margin. Attention directed on followup. 4. Chest CT today reported separately      03/14/2016 Procedure    She has normal baseline hearing test      03/15/2016 Procedure    1. Successful placement of a right internal jugular approach power injectable Port-A-Cath. The Port a catheter is ready for immediate use. 2. Successful fluoroscopic insertion of a 20-French pull-through gastrostomy tube. The gastrostomy may be used immediately for medication administration and may be utilized in 24 hrs for the initiation of feeds.      03/15/2016 - 04/30/2016 Radiation Therapy    Tongue and Bilateral Neck treated to 68 Gy in 33 fractions      03/16/2016 - 04/27/2016 Chemotherapy    She received weekly cisplatin. Cycle 2-6 reduced dose at 20 mg/m2 due to significant side-effects and poor tolerance       04/04/2016 Adverse Reaction    Chemotherapy is placed on hold due to acute renal failure      04/13/2016 Adverse Reaction    Chemo was placed on hold due to shingles outbreak and paronychia  05/29/2016 - 06/04/2016 Hospital Admission    She was admitted to the hospital for management of nausea, vomiting and inability to tolerate tube feeding      06/06/2016 - 06/13/2016 Hospital Admission    She was admitted to the hospital for management of nausea,  vomiting and inability to tolerate tube feeding      08/09/2016 Imaging    1. New bilateral upper lobe ground-glass nodules. Favor infectious or inflammatory process. Recommend close attention on routine follow-up. 2. Angular nodule RIGHT upper lobe is also favored infectious or inflammatory. Recommend attention on follow-up as above. 3. No mediastinal or supraclavicular lymphadenopathy. 4. Small RIGHT thyroid nodule does not meet biopsy criteria. Please see separate CT neck dictation.      08/09/2016 Imaging    1. Progression of disease with new/markedly worsened mass of the left base of tongue measuring 2.9 x 2.2 x 2.3 centimeters. Persistent soft tissue thickening of the postoperative left neck extending from the angle of the mandible to the level of the hyoid bone. 2. Confluent, centrally necrotic mass in the right submental region, at the site of previously seen enlarged level 1B lymph node. This mass extends into the floor of the mouth and displaces or invades the right sublingual gland and the right mylohyoid and hyoglossus muscles. 3. Centrally necrotic right level IIa lymph node is new from the prior study and consistent with metastatic spread. No other lower cervical lymphadenopathy. 4. Multiple ground-glass opacities in the lung apices. Please see dedicated report for concomitant chest CT for more complete characterization. 5. Thickening of the epiglottis and aryepiglottic folds is likely secondary to prior radiation treatment. No acute airway compromise.      08/22/2016 PET scan    1. The recently described progressive cervical disease is markedly hypermetabolic. This includes local recurrence in the left base of tongue and a confluent right submental nodal mass. In addition, there are small hypermetabolic lymph nodes on the right in station IIb and the anterior to the lower trachea.  2. No typical metastatic disease within the chest, abdomen or pelvis. 3. The recently described  ground-glass opacities in both upper lobes have enlarged, become more dense and are associated with moderate hypermetabolic activity. These are new from February and probably inflammatory/infectious. CT follow-up after appropriate therapy recommended.      08/27/2016 -  Chemotherapy    She received palliative chemo with carboplatin/5FU and cetuximab      09/11/2016 Adverse Reaction    Dose of cetuximab is reduced due to skin toxicity      10/08/2016 PET scan    1. Decrease in metabolic activity of LEFT base of tongue lesion. 2. Decrease in size and metabolic activity of right submental adenopathy/malignancy 3. Reduction in metabolic activity of RIGHT level 2 lymph node. 4. No evidence of metastatic progression chest, abdomen, pelvis. 5. Persistent bilateral upper lobe foci of consolidation. Favored inflammatory or infectious etiology and consider adverse drug reaction.       INTERVAL HISTORY: Please see below for problem oriented charting. She returns for further chemotherapy She feels weak and tired She also felt dehydrated She denies cough, chest pain or shortness of breath Her pain is stable She denies recent nausea or vomiting No changes in bowel habits  REVIEW OF SYSTEMS:   Constitutional: Denies fevers, chills or abnormal weight loss Eyes: Denies blurriness of vision Ears, nose, mouth, throat, and face: Denies mucositis or sore throat Respiratory: Denies cough, dyspnea or wheezes Cardiovascular: Denies palpitation, chest discomfort or  lower extremity swelling Gastrointestinal:  Denies nausea, heartburn or change in bowel habits Skin: Denies abnormal skin rashes Lymphatics: Denies new lymphadenopathy or easy bruising Neurological:Denies numbness, tingling or new weaknesses Behavioral/Psych: Mood is stable, no new changes  All other systems were reviewed with the patient and are negative.  I have reviewed the past medical history, past surgical history, social history  and family history with the patient and they are unchanged from previous note.  ALLERGIES:  has No Known Allergies.  MEDICATIONS:  Current Outpatient Prescriptions  Medication Sig Dispense Refill  . Amino Acids-Protein Hydrolys (FEEDING SUPPLEMENT, PRO-STAT SUGAR FREE 64,) LIQD Place 30 mLs into feeding tube daily. 900 mL 0  . amoxicillin (AMOXIL) 250 MG/5ML suspension Take 10 mLs (500 mg total) by mouth 3 (three) times daily. 300 mL 0  . chlorhexidine (PERIDEX) 0.12 % solution Rinse with 15 mls three times daily for 30 seconds. Use after breakfast, dinner, and at bedtime. Spit out excess. Do not swallow. 480 mL prn  . clindamycin (CLINDAGEL) 1 % gel Apply topically 2 (two) times daily. 30 g 0  . HYDROmorphone HCl (DILAUDID) 1 MG/ML LIQD Place 6 mLs (6 mg total) into feeding tube every 3 (three) hours as needed for moderate pain or severe pain. 473 mL 0  . levothyroxine (SYNTHROID, LEVOTHROID) 75 MCG tablet Take 1 tablet (75 mcg total) by mouth daily before breakfast. 30 tablet 9  . lidocaine-prilocaine (EMLA) cream Apply to affected area once 30 g 3  . LORazepam (ATIVAN) 1 MG tablet Take 1 tablet (1 mg total) by mouth every 8 (eight) hours as needed for anxiety or sleep. 60 tablet 0  . mirtazapine (REMERON SOL-TAB) 15 MG disintegrating tablet Take 1 tablet (15 mg total) by mouth at bedtime. 30 tablet 9  . Nutritional Supplements (FEEDING SUPPLEMENT, VITAL 1.5 CAL,) LIQD D/C continuous feeding pump. Give Vital 1.5 or equivalent, 7 bottles daily, bolus feeding via PEG. Flush tube with 60 mL free water before and after bolus feeding QID. In addition, flush tube with 250 mL free water QID. Send formula and syringes. Pick up pump. 7 Can 4  . OLANZapine (ZYPREXA) 5 MG tablet Place 1 tablet (5 mg total) into feeding tube at bedtime. (Patient not taking: Reported on 08/10/2016) 30 tablet 9  . ondansetron (ZOFRAN ODT) 8 MG disintegrating tablet Take 1 tablet (8 mg total) by mouth every 8 (eight) hours as  needed for nausea or vomiting. (Patient not taking: Reported on 08/10/2016) 30 tablet 5  . ondansetron (ZOFRAN) 8 MG tablet TAKE 1 TABLET BY MOUTH 2 TIMES DAILY AS NEEDED FOR REFRACTORY NAUSEA / VOMITING. START ON DAY 3 AFTER CHEMO. 30 tablet 1  . polyethylene glycol (MIRALAX / GLYCOLAX) packet Place 17 g into feeding tube daily. 14 each 0  . prochlorperazine (COMPAZINE) 10 MG tablet Take 1 tablet (10 mg total) by mouth every 6 (six) hours as needed (Nausea or vomiting). 30 tablet 1  . prochlorperazine (COMPAZINE) 10 MG tablet TAKE 1 TABLET BY MOUTH EVERY 6 HOURS AS NEEDED FOR NAUSEA AND VOMITING 30 tablet 1  . senna (SENOKOT) 8.6 MG tablet Take 1 tablet by mouth 2 (two) times daily.     No current facility-administered medications for this visit.    Facility-Administered Medications Ordered in Other Visits  Medication Dose Route Frequency Provider Last Rate Last Dose  . heparin lock flush 100 unit/mL  500 Units Intracatheter Once PRN Alvy Bimler, Leylany Nored, MD      . sodium chloride flush (NS)  0.9 % injection 10 mL  10 mL Intracatheter PRN [name], MD        PHYSICAL EXAMINATION: ECOG PERFORMANCE STATUS: 2 - Symptomatic, <50% confined to bed  Vitals:   10/23/16 1026  BP: 115/79  Pulse: (!) 114  Resp: 20  Temp: 98.9 F (37.2 C)  SpO2: 100%   Filed Weights   10/23/16 1026  Weight: 174 lb 4.8 oz (79.1 kg)    GENERAL:alert, no distress and comfortable SKIN: Noted acneform rash EYES: normal, Conjunctiva are pink and non-injected, sclera clear OROPHARYNX:no exudate, no erythema and lips, buccal mucosa, and tongue normal  NECK: supple, thyroid normal size, non-tender, without nodularity LYMPH:  no palpable lymphadenopathy in the cervical, axillary or inguinal LUNGS: clear to auscultation and percussion with normal breathing effort HEART: Tachycardia, no murmurs and no lower extremity edema ABDOMEN:abdomen soft, non-tender and normal bowel sounds Musculoskeletal:no cyanosis of digits and no  clubbing  NEURO: alert & oriented x 3 with fluent speech, no focal motor/sensory deficits  LABORATORY DATA:  I have reviewed the data as listed    Component Value Date/Time   NA 140 10/23/2016 1001   K 3.5 10/23/2016 1001   CL 101 06/12/2016 0540   CO2 25 10/23/2016 1001   GLUCOSE 104 10/23/2016 1001   BUN 18.0 10/23/2016 1001   CREATININE 1.1 10/23/2016 1001   CALCIUM 9.5 10/23/2016 1001   PROT 7.2 10/23/2016 1001   ALBUMIN 3.4 (L) 10/23/2016 1001   AST 20 10/23/2016 1001   ALT 10 10/23/2016 1001   ALKPHOS 89 10/23/2016 1001   BILITOT 0.52 10/23/2016 1001   GFRNONAA 56 (L) 06/12/2016 0540   GFRAA >60 06/12/2016 0540    No results found for: SPEP, UPEP  Lab Results  Component Value Date   WBC 1.5 (L) 10/23/2016   NEUTROABS 0.8 (L) 10/23/2016   HGB 7.0 (L) 10/23/2016   HCT 20.0 (L) 10/23/2016   MCV 86.9 10/23/2016   PLT 95 (L) 10/23/2016      Chemistry      Component Value Date/Time   NA 140 10/23/2016 1001   K 3.5 10/23/2016 1001   CL 101 06/12/2016 0540   CO2 25 10/23/2016 1001   BUN 18.0 10/23/2016 1001   CREATININE 1.1 10/23/2016 1001      Component Value Date/Time   CALCIUM 9.5 10/23/2016 1001   ALKPHOS 89 10/23/2016 1001   AST 20 10/23/2016 1001   ALT 10 10/23/2016 1001   BILITOT 0.52 10/23/2016 1001       RADIOGRAPHIC STUDIES: I have personally reviewed the radiological images as listed and agreed with the findings in the report. 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 Patient status post chemotherapy. Radiation therapy completed 04/30/2016. EXAM: NUCLEAR MEDICINE PET SKULL BASE TO THIGH TECHNIQUE: 13.3 mCi F-18 FDG was injected intravenously. Full-ring PET imaging was performed from the skull base to thigh after the radiotracer. CT data was obtained and used for attenuation correction and anatomic localization. FASTING BLOOD  GLUCOSE:  Value: 90 mg/dl COMPARISON:  PET-CT 8228 FINDINGS: NECK Intense activity in the for the RIGHT floor of the mouth along the inferior margin of the mandible is decreased in geographic size and metabolic activity with SUV max equals 6.7 decreased from 13.3. Intense activity in the LEFT base of tongue is also decreased SUV max equals 7.4 compared to 12.0. T Here is reduction in metabolic activity  of RIGHT level 2 lymph node is well reduction size (image 256 of the fused data set). Linear activity in the RIGHT neck musculature consistent physiologic activity. CHEST Rounded consolidation in the LEFT and RIGHT upper lobe similar comparison exam. Focus in the RIGHT upper lobe is increased in size measuring 27 mm compared to 15 mm but but unchanged in metabolic activity with SUV max equal 4.1 compared to 4.7. Lesion in the LEFT upper lobe similar in size measuring 18 mm compared to 20 mm but with reduction in metabolic activity SUV max 2.7 decreased from 5.3. No new foci noted. No hypermetabolic mediastinal lymph nodes. ABDOMEN/PELVIS Metabolic activity along the gastrostomy tract is favored benign. No abnormal metabolic activity liver. No hypermetabolic abdominopelvic lymph nodes. Uterus and ovaries normal. SKELETON No focal hypermetabolic activity to suggest skeletal metastasis. IMPRESSION: 1. Decrease in metabolic activity of LEFT base of tongue lesion. 2. Decrease in size and metabolic activity of right submental adenopathy/malignancy 3. Reduction in metabolic activity of RIGHT level 2 lymph node. 4. No evidence of metastatic progression chest, abdomen, pelvis. 5. Persistent bilateral upper lobe foci of consolidation. Favored inflammatory or infectious etiology and consider adverse drug reaction. Electronically Signed   By: Suzy Bouchard M.D.   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 The patient is symptomatic from anemia and the EPO level is low. Some of the side-effects to be expected including risks of allergic reactions, skin rashes, headaches, risk of blood clots including heart attack and stroke. There is rare risks of causing growth of cancers.The patient is willing to proceed From the leukopenia and thrombocytopenia standpoint, she does not need further treatment for those    Cancer associated pain We discussed pain management extensively She declined addition of fentanyl patch We will continue liquid Dilaudid  Dehydration She is clinically dehydrated She will continue aggressive daily IV fluid support through advance home care service  Acneiform drug eruption Skin is better since holding treatment recently However, due to pancytopenia, I would delay her treatment further   Orders Placed This Encounter  Procedures  . [facility]UN[facility]    Lab appointment 15 min.  [facility] SCHEDULING COMMUNICATION INJECTION    Schedule injection appointment 15 min  . ARANESP TREATMENT CONDITION    Hold Aranesp: Chemotherapy Induced Anemia hold for Hemoglobin greater than 10 / Renal hold for Hemoglobin greater than 11   All questions were answered. The patient knows to call the clinic with any problems, questions or concerns. No barriers to learning was detected. I spent 30 minutes counseling the patient face to face. The total time spent in the appointment was 40 minutes and more than 50% was on counseling and review of test results     [name], MD 10/23/2016 4:01 PM

## 2016-10-23 NOTE — Assessment & Plan Note (Signed)
Unfortunately, she is developing profound pancytopenia I will cancel her treatment today We will continue active supportive care

## 2016-10-23 NOTE — Patient Instructions (Signed)

## 2016-10-30 ENCOUNTER — Other Ambulatory Visit (HOSPITAL_BASED_OUTPATIENT_CLINIC_OR_DEPARTMENT_OTHER): Payer: 59

## 2016-10-30 ENCOUNTER — Ambulatory Visit: Payer: 59

## 2016-10-30 ENCOUNTER — Ambulatory Visit (HOSPITAL_BASED_OUTPATIENT_CLINIC_OR_DEPARTMENT_OTHER): Payer: 59

## 2016-10-30 ENCOUNTER — Other Ambulatory Visit: Payer: Self-pay | Admitting: Hematology and Oncology

## 2016-10-30 VITALS — BP 99/59 | HR 95 | Temp 99.1°F | Resp 19

## 2016-10-30 DIAGNOSIS — D539 Nutritional anemia, unspecified: Secondary | ICD-10-CM

## 2016-10-30 DIAGNOSIS — C023 Malignant neoplasm of anterior two-thirds of tongue, part unspecified: Secondary | ICD-10-CM

## 2016-10-30 DIAGNOSIS — E860 Dehydration: Secondary | ICD-10-CM

## 2016-10-30 DIAGNOSIS — C029 Malignant neoplasm of tongue, unspecified: Secondary | ICD-10-CM

## 2016-10-30 LAB — CBC WITH DIFFERENTIAL/PLATELET
BASO%: 0.4 % (ref 0.0–2.0)
BASOS ABS: 0 10*3/uL (ref 0.0–0.1)
EOS%: 2.9 % (ref 0.0–7.0)
Eosinophils Absolute: 0.1 10*3/uL (ref 0.0–0.5)
HEMATOCRIT: 20.3 % — AB (ref 34.8–46.6)
HEMOGLOBIN: 7.1 g/dL — AB (ref 11.6–15.9)
LYMPH#: 0.9 10*3/uL (ref 0.9–3.3)
LYMPH%: 35.5 % (ref 14.0–49.7)
MCH: 31.2 pg (ref 25.1–34.0)
MCHC: 35 g/dL (ref 31.5–36.0)
MCV: 89.3 fL (ref 79.5–101.0)
MONO#: 0.4 10*3/uL (ref 0.1–0.9)
MONO%: 17.5 % — AB (ref 0.0–14.0)
NEUT#: 1.1 10*3/uL — ABNORMAL LOW (ref 1.5–6.5)
NEUT%: 43.7 % (ref 38.4–76.8)
Platelets: 65 10*3/uL — ABNORMAL LOW (ref 145–400)
RBC: 2.27 10*6/uL — ABNORMAL LOW (ref 3.70–5.45)
RDW: 23.5 % — AB (ref 11.2–14.5)
WBC: 2.5 10*3/uL — ABNORMAL LOW (ref 3.9–10.3)

## 2016-10-30 LAB — COMPREHENSIVE METABOLIC PANEL
ALBUMIN: 3.5 g/dL (ref 3.5–5.0)
ALK PHOS: 89 U/L (ref 40–150)
ALT: 12 U/L (ref 0–55)
AST: 24 U/L (ref 5–34)
Anion Gap: 10 mEq/L (ref 3–11)
BILIRUBIN TOTAL: 0.43 mg/dL (ref 0.20–1.20)
BUN: 19.8 mg/dL (ref 7.0–26.0)
CALCIUM: 9.6 mg/dL (ref 8.4–10.4)
CO2: 25 mEq/L (ref 22–29)
Chloride: 106 mEq/L (ref 98–109)
Creatinine: 1.2 mg/dL — ABNORMAL HIGH (ref 0.6–1.1)
EGFR: 53 mL/min/{1.73_m2} — AB (ref >60)
GLUCOSE: 124 mg/dL (ref 70–140)
POTASSIUM: 4.1 meq/L (ref 3.5–5.1)
Sodium: 141 mEq/L (ref 136–145)
TOTAL PROTEIN: 7.5 g/dL (ref 6.4–8.3)

## 2016-10-30 LAB — MAGNESIUM: MAGNESIUM: 1.9 mg/dL (ref 1.5–2.5)

## 2016-10-30 LAB — TECHNOLOGIST REVIEW

## 2016-10-30 MED ORDER — SODIUM CHLORIDE 0.9% FLUSH
10.0000 mL | INTRAVENOUS | Status: DC | PRN
  Administered 2016-10-30: 10 mL
  Filled 2016-10-30: qty 10

## 2016-10-30 MED ORDER — HEPARIN SOD (PORK) LOCK FLUSH 100 UNIT/ML IV SOLN
500.0000 [IU] | Freq: Once | INTRAVENOUS | Status: AC | PRN
  Administered 2016-10-30: 500 [IU]
  Filled 2016-10-30: qty 5

## 2016-10-30 MED ORDER — PROMOD PO LIQD: ORAL | 12 refills | Status: AC

## 2016-10-30 MED ORDER — SODIUM CHLORIDE 0.9 % IV SOLN
Freq: Once | INTRAVENOUS | Status: AC
  Administered 2016-10-30: 09:00:00 via INTRAVENOUS

## 2016-10-30 MED ORDER — DARBEPOETIN ALFA 200 MCG/0.4ML IJ SOSY: 200.0000 ug | PREFILLED_SYRINGE | Freq: Once | INTRAMUSCULAR | Status: DC

## 2016-10-30 NOTE — Progress Notes (Signed)
Per Dr. Alvy Bimler, no treatment today, pt to finish IVFs only. No Aranesp today (pt received Aranesp 10/23/16). Pt declines blood transfusion at this time. Pt educated for s/s to be aware of. Pt verbalizes understanding. No s/s of distress at this time.

## 2016-10-30 NOTE — Progress Notes (Signed)
Nutrition Follow-up:  Patient with tongue cancer followed by Dr. Alvy Bimler.    Met with patient during infusion today, treatment held but IV fluids given.  Patient reports today that she is mostly taking 5 cans of vital 1.5 daily.  Reports she can't tolerate 1.5 or 2 cans of tube feeding at one time because she throws up.  Reports that she is taking extra water via tube feeding to try and help with hydration.  Continues to receive daily IV fluids at home from [facility].     Medications: reviewed  Labs: reviewed  Anthropometrics:   Weight has decreased to 174 lb 4.8 oz on 10/23 from 176 lb on 10/8.  Noted weight has been in the 180s since May 2018.   Re-Estimated Energy Needs  Kcals: 2000-2400 calories/d Protein: 100-120 g/kg Fluid: 2.4 L/d  NUTRITION DIAGNOSIS: Unintentional weight loss continues   MALNUTRITION DIAGNOSIS: severe malnutrition continues   INTERVENTION:   Talked with patient again today about continuous tube feeding option but patient refused.  Has done this in the past and does not want to try it again.  Patient wants to continue 5 cans of vital 1.5 at this time, noncompliant to 7 cans per day of tube feeding.   Willing to try promod (protein supplement) for additional protein 44m daily.   Current tube feeding regimen will provide 1875kcals, 90 g of protein, 2040 ml free water (water in formula, flush with feeding and promod and additional 246mBID between feedings).   Contacted Karen with [employer] promod order.     MONITORING, EVALUATION, GOAL: Patient will tolerate feeding to meet greater than 90% of estimated nutrition needs   NEXT VISIT: Nov 13 during infusion  Kaire Stary B. AlZenia ResidesRDWantaghLDHoffmanegistered Dietitian [phone])

## 2016-10-30 NOTE — Progress Notes (Signed)
Per Dr. Alvy Bimler, okay to proceed with patient's daily hydration while here today.

## 2016-10-30 NOTE — Patient Instructions (Addendum)
Dehydration, Adult Dehydration is when there is not enough fluid or water in your body. This happens when you lose more fluids than you take in. Dehydration can range from mild to very bad. It should be treated right away to keep it from getting very bad. Symptoms of mild dehydration may include:  Thirst.  Dry lips.  Slightly dry mouth.  Dry, warm skin.  Dizziness. Symptoms of moderate dehydration may include:  Very dry mouth.  Muscle cramps.  Dark pee (urine). Pee may be the color of tea.  Your body making less pee.  Your eyes making fewer tears.  Heartbeat that is uneven or faster than normal (palpitations).  Headache.  Light-headedness, especially when you stand up from sitting.  Fainting (syncope). Symptoms of very bad dehydration may include:  Changes in skin, such as: ? Cold and clammy skin. ? Blotchy (mottled) or pale skin. ? Skin that does not quickly return to normal after being lightly pinched and let go (poor skin turgor).  Changes in body fluids, such as: ? Feeling very thirsty. ? Your eyes making fewer tears. ? Not sweating when body temperature is high, such as in hot weather. ? Your body making very little pee.  Changes in vital signs, such as: ? Weak pulse. ? Pulse that is more than 100 beats a minute when you are sitting still. ? Fast breathing. ? Low blood pressure.  Other changes, such as: ? Sunken eyes. ? Cold hands and feet. ? Confusion. ? Lack of energy (lethargy). ? Trouble waking up from sleep. ? Short-term weight loss. ? Unconsciousness. Follow these instructions at home:  If told by your doctor, drink an ORS: ? Make an ORS by using instructions on the package. ? Start by drinking small amounts, about  cup (120 mL) every 5-10 minutes. ? Slowly drink more until you have had the amount that your doctor said to have.  Drink enough clear fluid to keep your pee clear or pale yellow. If you were told to drink an ORS, finish the ORS  first, then start slowly drinking clear fluids. Drink fluids such as: ? Water. Do not drink only water by itself. Doing that can make the salt (sodium) level in your body get too low (hyponatremia). ? Ice chips. ? Fruit juice that you have added water to (diluted). ? Low-calorie sports drinks.  Avoid: ? Alcohol. ? Drinks that have a lot of sugar. These include high-calorie sports drinks, fruit juice that does not have water added, and soda. ? Caffeine. ? Foods that are greasy or have a lot of fat or sugar.  Take over-the-counter and prescription medicines only as told by your doctor.  Do not take salt tablets. Doing that can make the salt level in your body get too high (hypernatremia).  Eat foods that have minerals (electrolytes). Examples include bananas, oranges, potatoes, tomatoes, and spinach.  Keep all follow-up visits as told by your doctor. This is important. Contact a doctor if:  You have belly (abdominal) pain that: ? Gets worse. ? Stays in one area (localizes).  You have a rash.  You have a stiff neck.  You get angry or annoyed more easily than normal (irritability).  You are more sleepy than normal.  You have a harder time waking up than normal.  You feel: ? Weak. ? Dizzy. ? Very thirsty.  You have peed (urinated) only a small amount of very dark pee during 6-8 hours. Get help right away if:  You have symptoms of   very bad dehydration.  You cannot drink fluids without throwing up (vomiting).  Your symptoms get worse with treatment.  You have a fever.  You have a very bad headache.  You are throwing up or having watery poop (diarrhea) and it: ? Gets worse. ? Does not go away.  You have blood or something green (bile) in your throw-up.  You have blood in your poop (stool). This may cause poop to look black and tarry.  You have not peed in 6-8 hours.  You pass out (faint).  Your heart rate when you are sitting still is more than 100 beats a  minute.  You have trouble breathing. This information is not intended to replace advice given to you by your health care provider. Make sure you discuss any questions you have with your health care provider. Document Released: 10/14/2008 Document Revised: 07/08/2015 Document Reviewed: 02/11/2015 Elsevier Interactive Patient Education  2018 [employer].  Anemia, Nonspecific Anemia is a condition in which the concentration of red blood cells or hemoglobin in the blood is below normal. Hemoglobin is a substance in red blood cells that carries oxygen to the tissues of the body. Anemia results in not enough oxygen reaching these tissues. What are the causes? Common causes of anemia include:  Excessive bleeding. Bleeding may be internal or external. This includes excessive bleeding from periods (in women) or from the intestine.  Poor nutrition.  Chronic kidney, thyroid, and liver disease.  Bone marrow disorders that decrease red blood cell production.  Cancer and treatments for cancer.  HIV, AIDS, and their treatments.  Spleen problems that increase red blood cell destruction.  Blood disorders.  Excess destruction of red blood cells due to infection, medicines, and autoimmune disorders.  What are the signs or symptoms?  Minor weakness.  Dizziness.  Headache.  Palpitations.  Shortness of breath, especially with exercise.  Paleness.  Cold sensitivity.  Indigestion.  Nausea.  Difficulty sleeping.  Difficulty concentrating. Symptoms may occur suddenly or they may develop slowly. How is this diagnosed? Additional blood tests are often needed. These help your health care provider determine the best treatment. Your health care provider will check your stool for blood and look for other causes of blood loss. How is this treated? Treatment varies depending on the cause of the anemia. Treatment can include:  Supplements of iron, vitamin N05, or folic acid.  Hormone  medicines.  A blood transfusion. This may be needed if blood loss is severe.  Hospitalization. This may be needed if there is significant continual blood loss.  Dietary changes.  Spleen removal.  Follow these instructions at home: Keep all follow-up appointments. It often takes many weeks to correct anemia, and having your health care provider check on your condition and your response to treatment is very important. Get help right away if:  You develop extreme weakness, shortness of breath, or chest pain.  You become dizzy or have trouble concentrating.  You develop heavy vaginal bleeding.  You develop a rash.  You have bloody or black, tarry stools.  You faint.  You vomit up blood.  You vomit repeatedly.  You have abdominal pain.  You have a fever or persistent symptoms for more than 2-3 days.  You have a fever and your symptoms suddenly get worse.  You are dehydrated. This information is not intended to replace advice given to you by your health care provider. Make sure you discuss any questions you have with your health care provider. Document Released: 01/26/2004 Document  Revised: 06/01/2015 Document Reviewed: 06/13/2012 Elsevier Interactive Patient Education  2017 [employer].

## 2016-11-05 ENCOUNTER — Other Ambulatory Visit: Payer: Self-pay | Admitting: Hematology and Oncology

## 2016-11-05 DIAGNOSIS — C023 Malignant neoplasm of anterior two-thirds of tongue, part unspecified: Secondary | ICD-10-CM

## 2016-11-06 ENCOUNTER — Ambulatory Visit (HOSPITAL_BASED_OUTPATIENT_CLINIC_OR_DEPARTMENT_OTHER): Payer: 59 | Admitting: Hematology and Oncology

## 2016-11-06 ENCOUNTER — Other Ambulatory Visit: Payer: Self-pay | Admitting: Hematology and Oncology

## 2016-11-06 ENCOUNTER — Encounter: Payer: Self-pay | Admitting: Hematology and Oncology

## 2016-11-06 ENCOUNTER — Ambulatory Visit: Payer: 59

## 2016-11-06 ENCOUNTER — Other Ambulatory Visit (HOSPITAL_BASED_OUTPATIENT_CLINIC_OR_DEPARTMENT_OTHER): Payer: 59

## 2016-11-06 ENCOUNTER — Ambulatory Visit (HOSPITAL_BASED_OUTPATIENT_CLINIC_OR_DEPARTMENT_OTHER): Payer: 59

## 2016-11-06 DIAGNOSIS — C023 Malignant neoplasm of anterior two-thirds of tongue, part unspecified: Secondary | ICD-10-CM

## 2016-11-06 DIAGNOSIS — Z7189 Other specified counseling: Secondary | ICD-10-CM | POA: Diagnosis not present

## 2016-11-06 DIAGNOSIS — N189 Chronic kidney disease, unspecified: Secondary | ICD-10-CM

## 2016-11-06 DIAGNOSIS — L270 Generalized skin eruption due to drugs and medicaments taken internally: Secondary | ICD-10-CM | POA: Diagnosis not present

## 2016-11-06 DIAGNOSIS — Z5112 Encounter for antineoplastic immunotherapy: Secondary | ICD-10-CM | POA: Diagnosis not present

## 2016-11-06 DIAGNOSIS — D631 Anemia in chronic kidney disease: Secondary | ICD-10-CM

## 2016-11-06 DIAGNOSIS — D61818 Other pancytopenia: Secondary | ICD-10-CM

## 2016-11-06 DIAGNOSIS — G893 Neoplasm related pain (acute) (chronic): Secondary | ICD-10-CM | POA: Diagnosis not present

## 2016-11-06 DIAGNOSIS — D539 Nutritional anemia, unspecified: Secondary | ICD-10-CM

## 2016-11-06 LAB — COMPREHENSIVE METABOLIC PANEL
ALBUMIN: 3.4 g/dL — AB (ref 3.5–5.0)
ALK PHOS: 85 U/L (ref 40–150)
ALT: 8 U/L (ref 0–55)
AST: 17 U/L (ref 5–34)
Anion Gap: 7 mEq/L (ref 3–11)
BILIRUBIN TOTAL: 0.35 mg/dL (ref 0.20–1.20)
BUN: 17.5 mg/dL (ref 7.0–26.0)
CO2: 27 mEq/L (ref 22–29)
CREATININE: 1.1 mg/dL (ref 0.6–1.1)
Calcium: 9.3 mg/dL (ref 8.4–10.4)
Chloride: 106 mEq/L (ref 98–109)
GLUCOSE: 110 mg/dL (ref 70–140)
Potassium: 3.5 mEq/L (ref 3.5–5.1)
SODIUM: 140 meq/L (ref 136–145)
TOTAL PROTEIN: 7.3 g/dL (ref 6.4–8.3)

## 2016-11-06 LAB — CBC WITH DIFFERENTIAL/PLATELET
BASO%: 0.6 % (ref 0.0–2.0)
Basophils Absolute: 0 10*3/uL (ref 0.0–0.1)
EOS%: 3 % (ref 0.0–7.0)
Eosinophils Absolute: 0.1 10*3/uL (ref 0.0–0.5)
HCT: 20.5 % — ABNORMAL LOW (ref 34.8–46.6)
HGB: 7.1 g/dL — ABNORMAL LOW (ref 11.6–15.9)
LYMPH%: 16.6 % (ref 14.0–49.7)
MCH: 32.1 pg (ref 25.1–34.0)
MCHC: 34.6 g/dL (ref 31.5–36.0)
MCV: 92.6 fL (ref 79.5–101.0)
MONO#: 0.3 10*3/uL (ref 0.1–0.9)
MONO%: 10.6 % (ref 0.0–14.0)
NEUT#: 1.8 10*3/uL (ref 1.5–6.5)
NEUT%: 69.2 % (ref 38.4–76.8)
Platelets: 278 10*3/uL (ref 145–400)
RBC: 2.21 10*6/uL — AB (ref 3.70–5.45)
RDW: 30.5 % — ABNORMAL HIGH (ref 11.2–14.5)
WBC: 2.6 10*3/uL — ABNORMAL LOW (ref 3.9–10.3)
lymph#: 0.4 10*3/uL — ABNORMAL LOW (ref 0.9–3.3)

## 2016-11-06 LAB — MAGNESIUM: Magnesium: 2.1 mg/dl (ref 1.5–2.5)

## 2016-11-06 MED ORDER — DIPHENHYDRAMINE HCL 50 MG/ML IJ SOLN
INTRAMUSCULAR | Status: AC
  Filled 2016-11-06: qty 1

## 2016-11-06 MED ORDER — FAMOTIDINE IN NACL 20-0.9 MG/50ML-% IV SOLN
INTRAVENOUS | Status: AC
  Filled 2016-11-06: qty 50

## 2016-11-06 MED ORDER — DIPHENHYDRAMINE HCL 50 MG/ML IJ SOLN
25.0000 mg | Freq: Once | INTRAMUSCULAR | Status: AC
  Administered 2016-11-06: 25 mg via INTRAVENOUS

## 2016-11-06 MED ORDER — SODIUM CHLORIDE 0.9 % IV SOLN
Freq: Once | INTRAVENOUS | Status: AC
  Administered 2016-11-06: 14:00:00 via INTRAVENOUS

## 2016-11-06 MED ORDER — ONDANSETRON HCL 4 MG/2ML IJ SOLN
8.0000 mg | Freq: Once | INTRAMUSCULAR | Status: AC
  Administered 2016-11-06: 8 mg via INTRAVENOUS

## 2016-11-06 MED ORDER — DEXAMETHASONE SODIUM PHOSPHATE 10 MG/ML IJ SOLN
10.0000 mg | Freq: Once | INTRAMUSCULAR | Status: AC
  Administered 2016-11-06: 10 mg via INTRAVENOUS

## 2016-11-06 MED ORDER — DARBEPOETIN ALFA 300 MCG/0.6ML IJ SOSY
300.0000 ug | PREFILLED_SYRINGE | Freq: Once | INTRAMUSCULAR | Status: AC
  Administered 2016-11-06: 300 ug via SUBCUTANEOUS
  Filled 2016-11-06: qty 0.6

## 2016-11-06 MED ORDER — HEPARIN SOD (PORK) LOCK FLUSH 100 UNIT/ML IV SOLN
500.0000 [IU] | Freq: Once | INTRAVENOUS | Status: AC | PRN
  Administered 2016-11-06: 500 [IU]
  Filled 2016-11-06: qty 5

## 2016-11-06 MED ORDER — DEXAMETHASONE SODIUM PHOSPHATE 10 MG/ML IJ SOLN
INTRAMUSCULAR | Status: AC
  Filled 2016-11-06: qty 1

## 2016-11-06 MED ORDER — SODIUM CHLORIDE 0.9% FLUSH
10.0000 mL | INTRAVENOUS | Status: DC | PRN
  Administered 2016-11-06: 10 mL
  Filled 2016-11-06: qty 10

## 2016-11-06 MED ORDER — ONDANSETRON HCL 4 MG/2ML IJ SOLN
INTRAMUSCULAR | Status: AC
  Filled 2016-11-06: qty 4

## 2016-11-06 MED ORDER — ONDANSETRON HCL 8 MG PO TABS: ORAL_TABLET | ORAL | 3 refills | Status: AC

## 2016-11-06 MED ORDER — FAMOTIDINE IN NACL 20-0.9 MG/50ML-% IV SOLN
20.0000 mg | Freq: Once | INTRAVENOUS | Status: AC
  Administered 2016-11-06: 20 mg via INTRAVENOUS

## 2016-11-06 MED ORDER — CETUXIMAB CHEMO IV INJECTION 200 MG/100ML
100.0000 mg/m2 | Freq: Once | INTRAVENOUS | Status: AC
  Administered 2016-11-06: 200 mg via INTRAVENOUS
  Filled 2016-11-06: qty 100

## 2016-11-06 NOTE — Assessment & Plan Note (Signed)
The patient is very debilitated and requiring maximum supportive care We discussed goals of care The patient appears depressed We discussed strategies including patient getting out of the house, to do some food shopping and to attempt to eat food by mouth

## 2016-11-06 NOTE — Progress Notes (Signed)
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 Tongue, biopsy, Anterior deep margin left SQUAMOUS CELL CARCINOMA 3. Tongue, biopsy, Posterior deep margin left NEGATIVE FOR CARCINOMA 4. Tongue, biopsy, Posterior margin left SQUAMOUS CELL CARCINOMA 5. Tongue, excisional biopsy, New anterior deep margin NEGATIVE FOR CARCINOMA 6. Tongue, excisional biopsy, New posterior deep margin SMALL FOCUS OF SQUAMOUS CELL CARCINOMA (0.1 CM, ONLY PRESENT AT THE DEEPER PERMINENT SECTION) 7. Tongue, resection for tumor, Left lateral INFILTRATIVE KERATINIZED SQUAMOUS CELL CARCINOMA (3.2 CM) THE TUMOR INVADES UNDERNEATH MUSCLE OF THE TONGUE (2.0 CM, PT2) LYMPHOVASCULAR AND PERINEURAL INVASION IDENTIFIED SQUAMOUS CELL CARCINOMA PRESENTED AT (FINAL MARGINS REFER TO PART 2-6) LATERAL INKED MARGIN (1.0 CM) MEDIAL INKED MARGIN (0.6 CM)      02/08/2016 Surgery    SURGICAL PROCEDURES: 1. Left hemiglossectomy with primary closure. 2. Left selective neck dissection (zones I-III).      03/02/2016 Imaging    CT chest with contrast: No evidence of metastatic disease in the chest. 2. Aortic atherosclerosis.       03/02/2016 Imaging    CT neck with contrast showed : Newly enlarged and round 10 mm  right level 1b lymph node (series 5, image 45). The level 1A nodes remain normal. This right 1 B node is nonspecific but suspicious for progressive nodal metastatic disease. It might be amenable to Ultrasound-guided FNA. 2. Interval left hemiglossectomy and selective left neck dissection with confluent indeterminate soft tissue from the anterior left submandibular space through the left carotid space and obscuring the left level 1B, level 2, and level IIIa nodal stations. This study will serve as a new postoperative baseline of this area. Attention directed on close interval follow-up. 3. Mild soft tissue thickening also along the left lateral tongue resection margin. Attention directed on followup. 4. Chest CT today reported separately      03/14/2016 Procedure    She has normal baseline hearing test      03/15/2016 Procedure    1. Successful placement of a right internal jugular approach power injectable Port-A-Cath. The Port a catheter is ready for immediate use. 2. Successful fluoroscopic insertion of a 20-French pull-through gastrostomy tube. The gastrostomy may be used immediately for medication administration and may be utilized in 24 hrs for the initiation of feeds.      03/15/2016 - 04/30/2016 Radiation Therapy    Tongue and Bilateral Neck treated to 68 Gy in 33 fractions      03/16/2016 - 04/27/2016 Chemotherapy    She received weekly cisplatin. Cycle 2-6 reduced dose at 20 mg/m2 due to significant side-effects and poor tolerance       04/04/2016 Adverse Reaction    Chemotherapy is placed on hold due to acute renal failure      04/13/2016 Adverse Reaction    Chemo was placed on hold due to shingles outbreak and paronychia  05/29/2016 - 06/04/2016 Hospital Admission    She was admitted to the hospital for management of nausea, vomiting and inability to tolerate tube feeding      06/06/2016 - 06/13/2016 Hospital Admission    She was admitted to the hospital for management of nausea,  vomiting and inability to tolerate tube feeding      08/09/2016 Imaging    1. New bilateral upper lobe ground-glass nodules. Favor infectious or inflammatory process. Recommend close attention on routine follow-up. 2. Angular nodule RIGHT upper lobe is also favored infectious or inflammatory. Recommend attention on follow-up as above. 3. No mediastinal or supraclavicular lymphadenopathy. 4. Small RIGHT thyroid nodule does not meet biopsy criteria. Please see separate CT neck dictation.      08/09/2016 Imaging    1. Progression of disease with new/markedly worsened mass of the left base of tongue measuring 2.9 x 2.2 x 2.3 centimeters. Persistent soft tissue thickening of the postoperative left neck extending from the angle of the mandible to the level of the hyoid bone. 2. Confluent, centrally necrotic mass in the right submental region, at the site of previously seen enlarged level 1B lymph node. This mass extends into the floor of the mouth and displaces or invades the right sublingual gland and the right mylohyoid and hyoglossus muscles. 3. Centrally necrotic right level IIa lymph node is new from the prior study and consistent with metastatic spread. No other lower cervical lymphadenopathy. 4. Multiple ground-glass opacities in the lung apices. Please see dedicated report for concomitant chest CT for more complete characterization. 5. Thickening of the epiglottis and aryepiglottic folds is likely secondary to prior radiation treatment. No acute airway compromise.      08/22/2016 PET scan    1. The recently described progressive cervical disease is markedly hypermetabolic. This includes local recurrence in the left base of tongue and a confluent right submental nodal mass. In addition, there are small hypermetabolic lymph nodes on the right in station IIb and the anterior to the lower trachea.  2. No typical metastatic disease within the chest, abdomen or pelvis. 3. The recently described  ground-glass opacities in both upper lobes have enlarged, become more dense and are associated with moderate hypermetabolic activity. These are new from February and probably inflammatory/infectious. CT follow-up after appropriate therapy recommended.      08/27/2016 -  Chemotherapy    She received palliative chemo with carboplatin/5FU and cetuximab      09/11/2016 Adverse Reaction    Dose of cetuximab is reduced due to skin toxicity      10/08/2016 PET scan    1. Decrease in metabolic activity of LEFT base of tongue lesion. 2. Decrease in size and metabolic activity of right submental adenopathy/malignancy 3. Reduction in metabolic activity of RIGHT level 2 lymph node. 4. No evidence of metastatic progression chest, abdomen, pelvis. 5. Persistent bilateral upper lobe foci of consolidation. Favored inflammatory or infectious etiology and consider adverse drug reaction.       INTERVAL HISTORY: Please see below for problem oriented charting. She is here today, accompanied by her husband The patient appears depressed She denies suicidal ideation Her pain is well controlled She denies nausea, vomiting or constipation She denies recent dysphagia but the patient has not attempted to eat anything by mouth The skin rash is about the same Her symptoms of dizziness and weakness are stable The patient denies any recent signs or symptoms of bleeding such as spontaneous epistaxis, hematuria or hematochezia.  REVIEW OF SYSTEMS:  Constitutional: Denies fevers, chills or abnormal weight loss Eyes: Denies blurriness of vision Ears, nose, mouth, throat, and face: Denies mucositis or sore throat Respiratory: Denies cough, dyspnea or wheezes Cardiovascular: Denies palpitation, chest discomfort or lower extremity swelling Gastrointestinal:  Denies nausea, heartburn or change in bowel habits Lymphatics: Denies new lymphadenopathy or easy bruising Neurological:Denies numbness, tingling or new  weaknesses Behavioral/Psych: Mood is stable, no new changes  All other systems were reviewed with the patient and are negative.  I have reviewed the past medical history, past surgical history, social history and family history with the patient and they are unchanged from previous note.  ALLERGIES:  has No Known Allergies.  MEDICATIONS:  Current Outpatient Medications  Medication Sig Dispense Refill  . Amino Acids-Protein Hydrolys (FEEDING SUPPLEMENT, PRO-STAT SUGAR FREE 64,) LIQD Place 30 mLs into feeding tube daily. 900 mL 0  . amoxicillin (AMOXIL) 250 MG/5ML suspension Take 10 mLs (500 mg total) by mouth 3 (three) times daily. 300 mL 0  . chlorhexidine (PERIDEX) 0.12 % solution Rinse with 15 mls three times daily for 30 seconds. Use after breakfast, dinner, and at bedtime. Spit out excess. Do not swallow. 480 mL prn  . clindamycin (CLINDAGEL) 1 % gel Apply topically 2 (two) times daily. 30 g 0  . HYDROmorphone HCl (DILAUDID) 1 MG/ML LIQD Place 6 mLs (6 mg total) into feeding tube every 3 (three) hours as needed for moderate pain or severe pain. 473 mL 0  . levothyroxine (SYNTHROID, LEVOTHROID) 75 MCG tablet Take 1 tablet (75 mcg total) by mouth daily before breakfast. 30 tablet 9  . lidocaine-prilocaine (EMLA) cream Apply to affected area once 30 g 3  . LORazepam (ATIVAN) 1 MG tablet Take 1 tablet (1 mg total) by mouth every 8 (eight) hours as needed for anxiety or sleep. 60 tablet 0  . mirtazapine (REMERON SOL-TAB) 15 MG disintegrating tablet Take 1 tablet (15 mg total) by mouth at bedtime. 30 tablet 9  . Nutritional Supplements (FEEDING SUPPLEMENT, VITAL 1.5 CAL,) LIQD D/C continuous feeding pump. Give Vital 1.5 or equivalent, 7 bottles daily, bolus feeding via PEG. Flush tube with 60 mL free water before and after bolus feeding QID. In addition, flush tube with 250 mL free water QID. Send formula and syringes. Pick up pump. 7 Can 4  . Nutritional Supplements (PROMOD) LIQD Give 78ml of  promod daily via feeding tube (Mix 33ml of promod and 15ml of water).  Flush feeding tube with 37ml of water before giving promod and after. 946 mL 12  . OLANZapine (ZYPREXA) 5 MG tablet Place 1 tablet (5 mg total) into feeding tube at bedtime. (Patient not taking: Reported on 08/10/2016) 30 tablet 9  . ondansetron (ZOFRAN ODT) 8 MG disintegrating tablet Take 1 tablet (8 mg total) by mouth every 8 (eight) hours as needed for nausea or vomiting. (Patient not taking: Reported on 08/10/2016) 30 tablet 5  . ondansetron (ZOFRAN) 8 MG tablet TAKE 1 TABLET BY MOUTH 2 TIMES DAILY AS NEEDED FOR REFRACTORY NAUSEA / VOMITING. START ON DAY 3 AFTER CHEMO. 90 tablet 3  . polyethylene glycol (MIRALAX / GLYCOLAX) packet Place 17 g into feeding tube daily. 14 each 0  . prochlorperazine (COMPAZINE) 10 MG tablet Take 1 tablet (10 mg total) by mouth every 6 (six) hours as needed (Nausea or vomiting). 30 tablet 1  . prochlorperazine (COMPAZINE) 10 MG tablet TAKE 1 TABLET BY MOUTH EVERY 6 HOURS AS NEEDED FOR NAUSEA AND VOMITING 30 tablet 1  . senna (  SENOKOT) 8.6 MG tablet Take 1 tablet by mouth 2 (two) times daily.     No current facility-administered medications for this visit.    Facility-Administered Medications Ordered in Other Visits  Medication Dose Route Frequency Provider Last Rate Last Dose  . heparin lock flush 100 unit/mL  500 Units Intracatheter Once PRN Alvy Bimler, Stormey Wilborn, MD      . sodium chloride flush (NS) 0.9 % injection 10 mL  10 mL Intracatheter PRN Trygg Mantz, MD      . sodium chloride flush (NS) 0.9 % injection 10 mL  10 mL Intracatheter PRN Alvy Bimler, Benny Deutschman, MD   10 mL at 11/06/16 1556    PHYSICAL EXAMINATION: ECOG PERFORMANCE STATUS: 2 - Symptomatic, <50% confined to bed  Vitals:   11/06/16 1143  BP: 109/70  Pulse: (!) 116  Resp: 20  Temp: 99.6 F (37.6 C)  SpO2: 100%   Filed Weights   11/06/16 1143  Weight: 175 lb 9.6 oz (79.7 kg)    GENERAL:alert, no distress and comfortable SKIN: skin  color, texture, turgor are normal, no rashes or significant lesions EYES: normal, Conjunctiva are pink and non-injected, sclera clear OROPHARYNX:no exudate, no erythema and lips, buccal mucosa, with resected tongue.  She is not able to open her mouth wide NECK: Neck is stiff from prior radiation LYMPH:  no palpable lymphadenopathy in the cervical, axillary or inguinal LUNGS: clear to auscultation and percussion with normal breathing effort HEART: regular rate & rhythm and no murmurs and no lower extremity edema ABDOMEN:abdomen soft, non-tender and normal bowel sounds Musculoskeletal:no cyanosis of digits and no clubbing  NEURO: alert & oriented x 3 with fluent speech, no focal motor/sensory deficits  LABORATORY DATA:  I have reviewed the data as listed    Component Value Date/Time   NA 140 11/06/2016 1101   K 3.5 11/06/2016 1101   CL 101 06/12/2016 0540   CO2 27 11/06/2016 1101   GLUCOSE 110 11/06/2016 1101   BUN 17.5 11/06/2016 1101   CREATININE 1.1 11/06/2016 1101   CALCIUM 9.3 11/06/2016 1101   PROT 7.3 11/06/2016 1101   ALBUMIN 3.4 (L) 11/06/2016 1101   AST 17 11/06/2016 1101   ALT 8 11/06/2016 1101   ALKPHOS 85 11/06/2016 1101   BILITOT 0.35 11/06/2016 1101   GFRNONAA 56 (L) 06/12/2016 0540   GFRAA >60 06/12/2016 0540    No results found for: SPEP, UPEP  Lab Results  Component Value Date   WBC 2.6 (L) 11/06/2016   NEUTROABS 1.8 11/06/2016   HGB 7.1 (L) 11/06/2016   HCT 20.5 (L) 11/06/2016   MCV 92.6 11/06/2016   PLT 278 11/06/2016      Chemistry      Component Value Date/Time   NA 140 11/06/2016 1101   K 3.5 11/06/2016 1101   CL 101 06/12/2016 0540   CO2 27 11/06/2016 1101   BUN 17.5 11/06/2016 1101   CREATININE 1.1 11/06/2016 1101      Component Value Date/Time   CALCIUM 9.3 11/06/2016 1101   ALKPHOS 85 11/06/2016 1101   AST 17 11/06/2016 1101   ALT 8 11/06/2016 1101   BILITOT 0.35 11/06/2016 1101       RADIOGRAPHIC STUDIES: I have personally  reviewed the radiological images as listed and agreed with the findings in the report. 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 Patient status post chemotherapy. Radiation therapy completed 04/30/2016. EXAM:  NUCLEAR MEDICINE PET SKULL BASE TO THIGH TECHNIQUE: 13.3 mCi F-18 FDG was injected intravenously. Full-ring PET imaging was performed from the skull base to thigh after the radiotracer. CT data was obtained and used for attenuation correction and anatomic localization. FASTING BLOOD GLUCOSE:  Value: 90 mg/dl COMPARISON:  PET-CT 8228 FINDINGS: NECK Intense activity in the for the RIGHT floor of the mouth along the inferior margin of the mandible is decreased in geographic size and metabolic activity with SUV max equals 6.7 decreased from 13.3. Intense activity in the LEFT base of tongue is also decreased SUV max equals 7.4 compared to 12.0. T Here is reduction in metabolic activity of RIGHT level 2 lymph node is well reduction size (image 256 of the fused data set). Linear activity in the RIGHT neck musculature consistent physiologic activity. CHEST Rounded consolidation in the LEFT and RIGHT upper lobe similar comparison exam. Focus in the RIGHT upper lobe is increased in size measuring 27 mm compared to 15 mm but but unchanged in metabolic activity with SUV max equal 4.1 compared to 4.7. Lesion in the LEFT upper lobe similar in size measuring 18 mm compared to 20 mm but with reduction in metabolic activity SUV max 2.7 decreased from 5.3. No new foci noted. No hypermetabolic mediastinal lymph nodes. ABDOMEN/PELVIS Metabolic activity along the gastrostomy tract is favored benign. No abnormal metabolic activity liver. No hypermetabolic abdominopelvic lymph nodes. Uterus and ovaries normal. SKELETON No focal hypermetabolic activity to suggest skeletal metastasis. IMPRESSION: 1. Decrease in  metabolic activity of LEFT base of tongue lesion. 2. Decrease in size and metabolic activity of right submental adenopathy/malignancy 3. Reduction in metabolic activity of RIGHT level 2 lymph node. 4. No evidence of metastatic progression chest, abdomen, pelvis. 5. Persistent bilateral upper lobe foci of consolidation. Favored inflammatory or infectious etiology and consider adverse drug reaction. Electronically Signed   By: Suzy Bouchard M.D.   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[place]) Recent imaging studies show positive response to treatment However, due to persistent pancytopenia, I will not restart chemotherapy I will resume cetuximab I will continue close follow-up with weekly blood draw We will resume chemotherapy when her hemoglobin is greater than 8.5 Due to her frail status, she will continue regular home visit with additional IV fluid support and symptom management at home through advanced home care agency  Pancytopenia, acquired [facility]) She is developing severe pancytopenia The patient has declined blood transfusion due to religious reasons even though she is symptomatic from anemia We discussed some of the risks, benefits, and alternatives of erythropoietin stimulating agents such as Procrit or Aranesp. The patient is symptomatic from anemia and the EPO level is low. Some of the side-effects to be expected including risks of allergic reactions, skin rashes, headaches, risk of blood clots including heart attack and stroke. There is rare risks of causing growth of cancers.The patient is willing to proceed From the leukopenia and thrombocytopenia standpoint, she does not need further treatment for those as they have improved with withholding of treatment I will increase darbepoetin injection to 300 mcg, to be given every 2 weeks to keep hemoglobin greater than 10  Cancer associated pain We discussed pain management extensively She  declined addition of fentanyl patch We will continue liquid Dilaudid  Acneiform drug eruption Skin is better since holding treatment recently I will resume treatment today  Goals of care, counseling/discussion The patient is very debilitated and requiring maximum supportive care  We discussed goals of care The patient appears depressed We discussed strategies including patient getting out of the house, to do some food shopping and to attempt to eat food by mouth   No orders of the defined types were placed in this encounter.  All questions were answered. The patient knows to call the clinic with any problems, questions or concerns. No barriers to learning was detected. I spent 25 minutes counseling the patient face to face. The total time spent in the appointment was 30 minutes and more than 50% was on counseling and review of test results     [name], MD 11/06/2016 5:36 PM

## 2016-11-06 NOTE — Progress Notes (Signed)
Per Dr Gorsuch ok to tx with today's labs. 

## 2016-11-06 NOTE — Patient Instructions (Signed)
Implanted Port Home Guide An implanted port is a type of central line that is placed under the skin. Central lines are used to provide IV access when treatment or nutrition needs to be given through a person's veins. Implanted ports are used for long-term IV access. An implanted port may be placed because:  You need IV medicine that would be irritating to the small veins in your hands or arms.  You need long-term IV medicines, such as antibiotics.  You need IV nutrition for a long period.  You need frequent blood draws for lab tests.  You need dialysis.  Implanted ports are usually placed in the chest area, but they can also be placed in the upper arm, the abdomen, or the leg. An implanted port has two main parts:  Reservoir. The reservoir is round and will appear as a small, raised area under your skin. The reservoir is the part where a needle is inserted to give medicines or draw blood.  Catheter. The catheter is a thin, flexible tube that extends from the reservoir. The catheter is placed into a large vein. Medicine that is inserted into the reservoir goes into the catheter and then into the vein.  How will I care for my incision site? Do not get the incision site wet. Bathe or shower as directed by your health care provider. How is my port accessed? Special steps must be taken to access the port:  Before the port is accessed, a numbing cream can be placed on the skin. This helps numb the skin over the port site.  Your health care provider uses a sterile technique to access the port. ? Your health care provider must put on a mask and sterile gloves. ? The skin over your port is cleaned carefully with an antiseptic and allowed to dry. ? The port is gently pinched between sterile gloves, and a needle is inserted into the port.  Only "non-coring" port needles should be used to access the port. Once the port is accessed, a blood return should be checked. This helps ensure that the port  is in the vein and is not clogged.  If your port needs to remain accessed for a constant infusion, a clear (transparent) bandage will be placed over the needle site. The bandage and needle will need to be changed every week, or as directed by your health care provider.  Keep the bandage covering the needle clean and dry. Do not get it wet. Follow your health care provider's instructions on how to take a shower or bath while the port is accessed.  If your port does not need to stay accessed, no bandage is needed over the port.  What is flushing? Flushing helps keep the port from getting clogged. Follow your health care provider's instructions on how and when to flush the port. Ports are usually flushed with saline solution or a medicine called heparin. The need for flushing will depend on how the port is used.  If the port is used for intermittent medicines or blood draws, the port will need to be flushed: ? After medicines have been given. ? After blood has been drawn. ? As part of routine maintenance.  If a constant infusion is running, the port may not need to be flushed.  How long will my port stay implanted? The port can stay in for as long as your health care provider thinks it is needed. When it is time for the port to come out, surgery will be   done to remove it. The procedure is similar to the one performed when the port was put in. When should I seek immediate medical care? When you have an implanted port, you should seek immediate medical care if:  You notice a bad smell coming from the incision site.  You have swelling, redness, or drainage at the incision site.  You have more swelling or pain at the port site or the surrounding area.  You have a fever that is not controlled with medicine.  This information is not intended to replace advice given to you by your health care provider. Make sure you discuss any questions you have with your health care provider. Document  Released: 12/18/2004 Document Revised: 05/26/2015 Document Reviewed: 08/25/2012 Elsevier Interactive Patient Education  2017 [employer]  

## 2016-11-06 NOTE — Assessment & Plan Note (Signed)
Skin is better since holding treatment recently I will resume treatment today

## 2016-11-06 NOTE — Assessment & Plan Note (Signed)
She is developing severe pancytopenia The patient has declined blood transfusion due to religious reasons even though she is symptomatic from anemia We discussed some of the risks, benefits, and alternatives of erythropoietin stimulating agents such as Procrit or Aranesp. The patient is symptomatic from anemia and the EPO level is low. Some of the side-effects to be expected including risks of allergic reactions, skin rashes, headaches, risk of blood clots including heart attack and stroke. There is rare risks of causing growth of cancers.The patient is willing to proceed From the leukopenia and thrombocytopenia standpoint, she does not need further treatment for those as they have improved with withholding of treatment I will increase darbepoetin injection to 300 mcg, to be given every 2 weeks to keep hemoglobin greater than 10

## 2016-11-06 NOTE — Patient Instructions (Signed)
[facility] Discharge Instructions for Patients Receiving Chemotherapy  Today you received the following chemotherapy agents Erbitux  To help prevent nausea and vomiting after your treatment, we encourage you to take your nausea medication as directed   If you develop nausea and vomiting that is not controlled by your nausea medication, call the clinic.   BELOW ARE SYMPTOMS THAT SHOULD BE REPORTED IMMEDIATELY:  *FEVER GREATER THAN 100.5 F  *CHILLS WITH OR WITHOUT FEVER  NAUSEA AND VOMITING THAT IS NOT CONTROLLED WITH YOUR NAUSEA MEDICATION  *UNUSUAL SHORTNESS OF BREATH  *UNUSUAL BRUISING OR BLEEDING  TENDERNESS IN MOUTH AND THROAT WITH OR WITHOUT PRESENCE OF ULCERS  *URINARY PROBLEMS  *BOWEL PROBLEMS  UNUSUAL RASH Items with * indicate a potential emergency and should be followed up as soon as possible.  Feel free to call the clinic should you have any questions or concerns. The clinic phone number is [phone].  Please show the CHEMO ALERT CARD at check-in to the Emergency Department and triage nurse.   

## 2016-11-06 NOTE — Assessment & Plan Note (Signed)
We discussed pain management extensively She declined addition of fentanyl patch We will continue liquid Dilaudid

## 2016-11-06 NOTE — Assessment & Plan Note (Signed)
Recent imaging studies show positive response to treatment However, due to persistent pancytopenia, I will not restart chemotherapy I will resume cetuximab I will continue close follow-up with weekly blood draw We will resume chemotherapy when her hemoglobin is greater than 8.5 Due to her frail status, she will continue regular home visit with additional IV fluid support and symptom management at home through advanced home care agency

## 2016-11-08 ENCOUNTER — Telehealth: Payer: Self-pay | Admitting: Hematology and Oncology

## 2016-11-08 NOTE — Telephone Encounter (Signed)
11/08/2016 @ 7:36 am called [phone] left message informing that FMLA/Disability forms were successfully faxed.

## 2016-11-09 ENCOUNTER — Telehealth: Payer: Self-pay | Admitting: Hematology and Oncology

## 2016-11-09 NOTE — Telephone Encounter (Signed)
Per 11/6 los - Return for No new orders or return visit.

## 2016-11-12 ENCOUNTER — Telehealth: Payer: Self-pay | Admitting: Hematology and Oncology

## 2016-11-12 NOTE — Telephone Encounter (Signed)
Left message for patient regarding appt added per 11/9 sch msg.

## 2016-11-13 ENCOUNTER — Ambulatory Visit: Payer: 59

## 2016-11-13 ENCOUNTER — Other Ambulatory Visit (HOSPITAL_BASED_OUTPATIENT_CLINIC_OR_DEPARTMENT_OTHER): Payer: 59

## 2016-11-13 ENCOUNTER — Ambulatory Visit (HOSPITAL_BASED_OUTPATIENT_CLINIC_OR_DEPARTMENT_OTHER): Payer: 59

## 2016-11-13 ENCOUNTER — Other Ambulatory Visit: Payer: Self-pay | Admitting: Hematology and Oncology

## 2016-11-13 VITALS — BP 113/55 | HR 106 | Temp 99.4°F | Resp 18

## 2016-11-13 DIAGNOSIS — D539 Nutritional anemia, unspecified: Secondary | ICD-10-CM

## 2016-11-13 DIAGNOSIS — C023 Malignant neoplasm of anterior two-thirds of tongue, part unspecified: Secondary | ICD-10-CM

## 2016-11-13 DIAGNOSIS — Z5111 Encounter for antineoplastic chemotherapy: Secondary | ICD-10-CM

## 2016-11-13 DIAGNOSIS — Z5112 Encounter for antineoplastic immunotherapy: Secondary | ICD-10-CM | POA: Diagnosis not present

## 2016-11-13 LAB — CBC WITH DIFFERENTIAL/PLATELET
BASO%: 0.3 % (ref 0.0–2.0)
Basophils Absolute: 0 10*3/uL (ref 0.0–0.1)
EOS ABS: 0.1 10*3/uL (ref 0.0–0.5)
EOS%: 3.5 % (ref 0.0–7.0)
HCT: 24.2 % — ABNORMAL LOW (ref 34.8–46.6)
HGB: 8.3 g/dL — ABNORMAL LOW (ref 11.6–15.9)
LYMPH%: 16.1 % (ref 14.0–49.7)
MCH: 31.9 pg (ref 25.1–34.0)
MCHC: 34.3 g/dL (ref 31.5–36.0)
MCV: 93.1 fL (ref 79.5–101.0)
MONO#: 0.7 10*3/uL (ref 0.1–0.9)
MONO%: 18.8 % — AB (ref 0.0–14.0)
NEUT%: 61.3 % (ref 38.4–76.8)
NEUTROS ABS: 2.3 10*3/uL (ref 1.5–6.5)
NRBC: 1 % — AB (ref 0–0)
Platelets: 274 10*3/uL (ref 145–400)
RBC: 2.6 10*6/uL — AB (ref 3.70–5.45)
WBC: 3.7 10*3/uL — AB (ref 3.9–10.3)
lymph#: 0.6 10*3/uL — ABNORMAL LOW (ref 0.9–3.3)

## 2016-11-13 LAB — MAGNESIUM: Magnesium: 1.8 mg/dl (ref 1.5–2.5)

## 2016-11-13 LAB — COMPREHENSIVE METABOLIC PANEL
ALBUMIN: 3.5 g/dL (ref 3.5–5.0)
ALK PHOS: 81 U/L (ref 40–150)
ALT: 10 U/L (ref 0–55)
AST: 16 U/L (ref 5–34)
Anion Gap: 7 mEq/L (ref 3–11)
BILIRUBIN TOTAL: 0.36 mg/dL (ref 0.20–1.20)
BUN: 16.9 mg/dL (ref 7.0–26.0)
CALCIUM: 9.4 mg/dL (ref 8.4–10.4)
CO2: 27 mEq/L (ref 22–29)
Chloride: 105 mEq/L (ref 98–109)
Creatinine: 1.2 mg/dL — ABNORMAL HIGH (ref 0.6–1.1)
EGFR: 53 mL/min/{1.73_m2} — AB (ref >60)
GLUCOSE: 109 mg/dL (ref 70–140)
POTASSIUM: 3.8 meq/L (ref 3.5–5.1)
SODIUM: 140 meq/L (ref 136–145)
TOTAL PROTEIN: 7.4 g/dL (ref 6.4–8.3)

## 2016-11-13 MED ORDER — CETUXIMAB CHEMO IV INJECTION 200 MG/100ML
83.3333 mg/m2 | Freq: Once | INTRAVENOUS | Status: AC
  Administered 2016-11-13: 200 mg via INTRAVENOUS
  Filled 2016-11-13: qty 100

## 2016-11-13 MED ORDER — DIPHENHYDRAMINE HCL 50 MG/ML IJ SOLN
INTRAMUSCULAR | Status: AC
  Filled 2016-11-13: qty 1

## 2016-11-13 MED ORDER — SODIUM CHLORIDE 0.9% FLUSH
10.0000 mL | INTRAVENOUS | Status: DC | PRN
  Filled 2016-11-13: qty 10

## 2016-11-13 MED ORDER — SODIUM CHLORIDE 0.9 % IV SOLN
800.0000 mg/m2/d | INTRAVENOUS | Status: DC
  Administered 2016-11-13: 6350 mg via INTRAVENOUS
  Filled 2016-11-13: qty 127

## 2016-11-13 MED ORDER — PALONOSETRON HCL INJECTION 0.25 MG/5ML
INTRAVENOUS | Status: AC
  Filled 2016-11-13: qty 5

## 2016-11-13 MED ORDER — FAMOTIDINE IN NACL 20-0.9 MG/50ML-% IV SOLN
INTRAVENOUS | Status: AC
  Filled 2016-11-13: qty 50

## 2016-11-13 MED ORDER — ONDANSETRON HCL 4 MG/2ML IJ SOLN
INTRAMUSCULAR | Status: AC
  Filled 2016-11-13: qty 4

## 2016-11-13 MED ORDER — SODIUM CHLORIDE 0.9 % IV SOLN
356.0000 mg | Freq: Once | INTRAVENOUS | Status: AC
  Administered 2016-11-13: 360 mg via INTRAVENOUS
  Filled 2016-11-13: qty 36

## 2016-11-13 MED ORDER — ONDANSETRON HCL 4 MG/2ML IJ SOLN
8.0000 mg | Freq: Once | INTRAMUSCULAR | Status: DC
  Administered 2016-11-13: 8 mg via INTRAVENOUS

## 2016-11-13 MED ORDER — DEXAMETHASONE SODIUM PHOSPHATE 10 MG/ML IJ SOLN
INTRAMUSCULAR | Status: AC
  Filled 2016-11-13: qty 1

## 2016-11-13 MED ORDER — DEXAMETHASONE SODIUM PHOSPHATE 10 MG/ML IJ SOLN
10.0000 mg | Freq: Once | INTRAMUSCULAR | Status: AC
  Administered 2016-11-13: 10 mg via INTRAVENOUS

## 2016-11-13 MED ORDER — PALONOSETRON HCL INJECTION 0.25 MG/5ML
0.2500 mg | Freq: Once | INTRAVENOUS | Status: AC
  Administered 2016-11-13: 0.25 mg via INTRAVENOUS

## 2016-11-13 MED ORDER — DIPHENHYDRAMINE HCL 25 MG PO CAPS
ORAL_CAPSULE | ORAL | Status: AC
  Filled 2016-11-13: qty 1

## 2016-11-13 MED ORDER — HEPARIN SOD (PORK) LOCK FLUSH 100 UNIT/ML IV SOLN
500.0000 [IU] | Freq: Once | INTRAVENOUS | Status: DC | PRN
  Filled 2016-11-13: qty 5

## 2016-11-13 MED ORDER — SODIUM CHLORIDE 0.9 % IV SOLN
Freq: Once | INTRAVENOUS | Status: AC
  Administered 2016-11-13: 11:00:00 via INTRAVENOUS

## 2016-11-13 MED ORDER — DIPHENHYDRAMINE HCL 50 MG/ML IJ SOLN
25.0000 mg | Freq: Once | INTRAMUSCULAR | Status: AC
  Administered 2016-11-13: 25 mg via INTRAVENOUS

## 2016-11-13 MED ORDER — FAMOTIDINE IN NACL 20-0.9 MG/50ML-% IV SOLN
20.0000 mg | Freq: Once | INTRAVENOUS | Status: AC
  Administered 2016-11-13: 20 mg via INTRAVENOUS

## 2016-11-13 MED ORDER — SODIUM CHLORIDE 0.9% FLUSH
10.0000 mL | Freq: Once | INTRAVENOUS | Status: AC
  Administered 2016-11-13: 10 mL
  Filled 2016-11-13: qty 10

## 2016-11-13 MED FILL — LIDOCAINE-PRILOCAINE CREAM: 2.5-2.5 | 10 days supply | Qty: 30 | Fill #0

## 2016-11-13 MED FILL — PROCHLORPERAZINE 10 MG TAB: 10 | 7 days supply | Qty: 30 | Fill #0

## 2016-11-13 NOTE — Patient Instructions (Signed)
Implanted Port Home Guide An implanted port is a type of central line that is placed under the skin. Central lines are used to provide IV access when treatment or nutrition needs to be given through a person's veins. Implanted ports are used for long-term IV access. An implanted port may be placed because:  You need IV medicine that would be irritating to the small veins in your hands or arms.  You need long-term IV medicines, such as antibiotics.  You need IV nutrition for a long period.  You need frequent blood draws for lab tests.  You need dialysis.  Implanted ports are usually placed in the chest area, but they can also be placed in the upper arm, the abdomen, or the leg. An implanted port has two main parts:  Reservoir. The reservoir is round and will appear as a small, raised area under your skin. The reservoir is the part where a needle is inserted to give medicines or draw blood.  Catheter. The catheter is a thin, flexible tube that extends from the reservoir. The catheter is placed into a large vein. Medicine that is inserted into the reservoir goes into the catheter and then into the vein.  How will I care for my incision site? Do not get the incision site wet. Bathe or shower as directed by your health care provider. How is my port accessed? Special steps must be taken to access the port:  Before the port is accessed, a numbing cream can be placed on the skin. This helps numb the skin over the port site.  Your health care provider uses a sterile technique to access the port. ? Your health care provider must put on a mask and sterile gloves. ? The skin over your port is cleaned carefully with an antiseptic and allowed to dry. ? The port is gently pinched between sterile gloves, and a needle is inserted into the port.  Only "non-coring" port needles should be used to access the port. Once the port is accessed, a blood return should be checked. This helps ensure that the port  is in the vein and is not clogged.  If your port needs to remain accessed for a constant infusion, a clear (transparent) bandage will be placed over the needle site. The bandage and needle will need to be changed every week, or as directed by your health care provider.  Keep the bandage covering the needle clean and dry. Do not get it wet. Follow your health care provider's instructions on how to take a shower or bath while the port is accessed.  If your port does not need to stay accessed, no bandage is needed over the port.  What is flushing? Flushing helps keep the port from getting clogged. Follow your health care provider's instructions on how and when to flush the port. Ports are usually flushed with saline solution or a medicine called heparin. The need for flushing will depend on how the port is used.  If the port is used for intermittent medicines or blood draws, the port will need to be flushed: ? After medicines have been given. ? After blood has been drawn. ? As part of routine maintenance.  If a constant infusion is running, the port may not need to be flushed.  How long will my port stay implanted? The port can stay in for as long as your health care provider thinks it is needed. When it is time for the port to come out, surgery will be   done to remove it. The procedure is similar to the one performed when the port was put in. When should I seek immediate medical care? When you have an implanted port, you should seek immediate medical care if:  You notice a bad smell coming from the incision site.  You have swelling, redness, or drainage at the incision site.  You have more swelling or pain at the port site or the surrounding area.  You have a fever that is not controlled with medicine.  This information is not intended to replace advice given to you by your health care provider. Make sure you discuss any questions you have with your health care provider. Document  Released: 12/18/2004 Document Revised: 05/26/2015 Document Reviewed: 08/25/2012 Elsevier Interactive Patient Education  2017 [employer]  

## 2016-11-13 NOTE — Progress Notes (Signed)
Nutrition Follow-up:  Patient with tongue cancer followed by Dr. Alvy Bimler.  Met with patient during during infusion.  Patient reports that she is giving 5-6 cans of vital 1.5 recently along with 2m of promod daily.  She continues to report that she is giving adequate free water flushes with tube feeding and continues to receive daily IV fluids from [facility].   No nutrition impact symptoms reported today  Medications: reviewed  Labs: reviewed  Anthropometrics:   Noted weight on 11/6 175 lb 9.6 oz increased from weight of 174 lb 4.8 oz on 10/23   Estimated Energy Needs  Kcals: 2000-2400 calories/d Protein: 100-120 g/d Fluid: 2.4 L/d  NUTRITION DIAGNOSIS: Unintentional weight loss stable   MALNUTRITION DIAGNOSIS: severe malnutrition continues   INTERVENTION:   Encouraged patient to continue 5-6 cans of vital 1.5 daily and 375mof promod daily.  Patient has been noncompliant to 7 cans of vital 1.5 and refuses to use continuous pump again. Patient may benefit from increasing promod to TID to better meet calorie and protein needs, if unable to provide additional cans of tube feeding and weight continues to decrease.      MONITORING, EVALUATION, GOAL: Patient will tolerate tube feeding to meet greater than 90% of estimated nutrition needs   NEXT VISIT: November 27 during infusion  Rueben Kassim B. AlZenia ResidesRDElkhornLDSelbyegistered Dietitian [phone])

## 2016-11-13 NOTE — Patient Instructions (Signed)
[facility] Discharge Instructions for Patients Receiving Chemotherapy  Today you received the following chemotherapy agents cetuximab (Erbitux), carboplatin, and adrucil (5FU)  To help prevent nausea and vomiting after your treatment, we encourage you to take your nausea medication as directed If you develop nausea and vomiting that is not controlled by your nausea medication, call the clinic.   BELOW ARE SYMPTOMS THAT SHOULD BE REPORTED IMMEDIATELY:  *FEVER GREATER THAN 100.5 F  *CHILLS WITH OR WITHOUT FEVER  NAUSEA AND VOMITING THAT IS NOT CONTROLLED WITH YOUR NAUSEA MEDICATION  *UNUSUAL SHORTNESS OF BREATH  *UNUSUAL BRUISING OR BLEEDING  TENDERNESS IN MOUTH AND THROAT WITH OR WITHOUT PRESENCE OF ULCERS  *URINARY PROBLEMS  *BOWEL PROBLEMS  UNUSUAL RASH Items with * indicate a potential emergency and should be followed up as soon as possible.  Feel free to call the clinic should you have any questions or concerns. The clinic phone number is [phone].  Please show the [facility] at check-in to the Emergency Department and triage nurse.

## 2016-11-13 NOTE — Progress Notes (Signed)
Dr. Alvy Bimler okay to tx with HR 106 and Hgb 8.3.

## 2016-11-14 MED FILL — MIRTAZAPINE 15 MG ODT: 15 | 30 days supply | Qty: 30 | Fill #3

## 2016-11-17 NOTE — Progress Notes (Deleted)
Called patient to inquire about pump d/c appointment. Patient stated "[facility]". Patient voiced that she has an appointment with home health today to get it d/c'd. This RN clarified and voiced understanding.   Wylene Simmer, BSN, RN 11/17/2016 1:59 PM

## 2016-11-20 ENCOUNTER — Encounter: Payer: Self-pay | Admitting: Hematology and Oncology

## 2016-11-20 ENCOUNTER — Ambulatory Visit (HOSPITAL_BASED_OUTPATIENT_CLINIC_OR_DEPARTMENT_OTHER): Payer: 59

## 2016-11-20 ENCOUNTER — Telehealth: Payer: Self-pay

## 2016-11-20 ENCOUNTER — Other Ambulatory Visit (HOSPITAL_BASED_OUTPATIENT_CLINIC_OR_DEPARTMENT_OTHER): Payer: 59

## 2016-11-20 ENCOUNTER — Ambulatory Visit (HOSPITAL_BASED_OUTPATIENT_CLINIC_OR_DEPARTMENT_OTHER): Payer: 59 | Admitting: Hematology and Oncology

## 2016-11-20 ENCOUNTER — Ambulatory Visit: Payer: 59

## 2016-11-20 VITALS — HR 109

## 2016-11-20 DIAGNOSIS — C023 Malignant neoplasm of anterior two-thirds of tongue, part unspecified: Secondary | ICD-10-CM

## 2016-11-20 DIAGNOSIS — D61818 Other pancytopenia: Secondary | ICD-10-CM | POA: Diagnosis not present

## 2016-11-20 DIAGNOSIS — D631 Anemia in chronic kidney disease: Secondary | ICD-10-CM | POA: Diagnosis not present

## 2016-11-20 DIAGNOSIS — Z5112 Encounter for antineoplastic immunotherapy: Secondary | ICD-10-CM | POA: Diagnosis not present

## 2016-11-20 DIAGNOSIS — G893 Neoplasm related pain (acute) (chronic): Secondary | ICD-10-CM | POA: Diagnosis not present

## 2016-11-20 DIAGNOSIS — R634 Abnormal weight loss: Secondary | ICD-10-CM

## 2016-11-20 DIAGNOSIS — N189 Chronic kidney disease, unspecified: Secondary | ICD-10-CM

## 2016-11-20 DIAGNOSIS — D539 Nutritional anemia, unspecified: Secondary | ICD-10-CM

## 2016-11-20 LAB — COMPREHENSIVE METABOLIC PANEL
ALT: 13 U/L (ref 0–55)
ANION GAP: 9 meq/L (ref 3–11)
AST: 23 U/L (ref 5–34)
Albumin: 3.7 g/dL (ref 3.5–5.0)
Alkaline Phosphatase: 82 U/L (ref 40–150)
BILIRUBIN TOTAL: 0.69 mg/dL (ref 0.20–1.20)
BUN: 18.9 mg/dL (ref 7.0–26.0)
CHLORIDE: 107 meq/L (ref 98–109)
CO2: 25 meq/L (ref 22–29)
CREATININE: 1.2 mg/dL — AB (ref 0.6–1.1)
Calcium: 9.8 mg/dL (ref 8.4–10.4)
EGFR: 57 mL/min/{1.73_m2} — AB (ref >60)
Glucose: 111 mg/dl (ref 70–140)
POTASSIUM: 3.7 meq/L (ref 3.5–5.1)
Sodium: 140 mEq/L (ref 136–145)
Total Protein: 8 g/dL (ref 6.4–8.3)

## 2016-11-20 LAB — CBC WITH DIFFERENTIAL/PLATELET
BASO%: 1.1 % (ref 0.0–2.0)
Basophils Absolute: 0 10*3/uL (ref 0.0–0.1)
EOS ABS: 0.1 10*3/uL (ref 0.0–0.5)
EOS%: 3.4 % (ref 0.0–7.0)
HCT: 25.2 % — ABNORMAL LOW (ref 34.8–46.6)
HGB: 8.7 g/dL — ABNORMAL LOW (ref 11.6–15.9)
LYMPH%: 11.9 % — AB (ref 14.0–49.7)
MCH: 33.4 pg (ref 25.1–34.0)
MCHC: 34.5 g/dL (ref 31.5–36.0)
MCV: 96.8 fL (ref 79.5–101.0)
MONO#: 0.2 10*3/uL (ref 0.1–0.9)
MONO%: 4.8 % (ref 0.0–14.0)
NEUT%: 78.8 % — ABNORMAL HIGH (ref 38.4–76.8)
NEUTROS ABS: 2.7 10*3/uL (ref 1.5–6.5)
Platelets: 220 10*3/uL (ref 145–400)
RBC: 2.6 10*6/uL — AB (ref 3.70–5.45)
RDW: 28.4 % — AB (ref 11.2–14.5)
WBC: 3.4 10*3/uL — AB (ref 3.9–10.3)
lymph#: 0.4 10*3/uL — ABNORMAL LOW (ref 0.9–3.3)

## 2016-11-20 LAB — MAGNESIUM: MAGNESIUM: 1.7 mg/dL (ref 1.5–2.5)

## 2016-11-20 MED ORDER — CETUXIMAB CHEMO IV INJECTION 200 MG/100ML
83.3333 mg/m2 | Freq: Once | INTRAVENOUS | Status: AC
  Administered 2016-11-20: 200 mg via INTRAVENOUS
  Filled 2016-11-20: qty 100

## 2016-11-20 MED ORDER — HEPARIN SOD (PORK) LOCK FLUSH 100 UNIT/ML IV SOLN
500.0000 [IU] | Freq: Once | INTRAVENOUS | Status: AC | PRN
  Administered 2016-11-20: 500 [IU]
  Filled 2016-11-20: qty 5

## 2016-11-20 MED ORDER — HYDROMORPHONE HCL 1 MG/ML PO LIQD: 6.0000 mg | ORAL | 0 refills | Status: DC | PRN

## 2016-11-20 MED ORDER — ONDANSETRON HCL 4 MG/2ML IJ SOLN
8.0000 mg | Freq: Once | INTRAMUSCULAR | Status: AC
  Administered 2016-11-20: 8 mg via INTRAVENOUS

## 2016-11-20 MED ORDER — DEXAMETHASONE SODIUM PHOSPHATE 10 MG/ML IJ SOLN
10.0000 mg | Freq: Once | INTRAMUSCULAR | Status: AC
  Administered 2016-11-20: 10 mg via INTRAVENOUS

## 2016-11-20 MED ORDER — DARBEPOETIN ALFA 300 MCG/0.6ML IJ SOSY
300.0000 ug | PREFILLED_SYRINGE | Freq: Once | INTRAMUSCULAR | Status: AC
  Administered 2016-11-20: 300 ug via SUBCUTANEOUS
  Filled 2016-11-20: qty 0.6

## 2016-11-20 MED ORDER — ONDANSETRON HCL 4 MG/2ML IJ SOLN
INTRAMUSCULAR | Status: AC
  Filled 2016-11-20: qty 4

## 2016-11-20 MED ORDER — FAMOTIDINE IN NACL 20-0.9 MG/50ML-% IV SOLN
INTRAVENOUS | Status: AC
  Filled 2016-11-20: qty 50

## 2016-11-20 MED ORDER — SODIUM CHLORIDE 0.9 % IV SOLN
Freq: Once | INTRAVENOUS | Status: AC
  Administered 2016-11-20: 11:00:00 via INTRAVENOUS

## 2016-11-20 MED ORDER — DIPHENHYDRAMINE HCL 50 MG/ML IJ SOLN
INTRAMUSCULAR | Status: AC
  Filled 2016-11-20: qty 1

## 2016-11-20 MED ORDER — SODIUM CHLORIDE 0.9% FLUSH
10.0000 mL | INTRAVENOUS | Status: DC | PRN
  Administered 2016-11-20: 10 mL
  Filled 2016-11-20: qty 10

## 2016-11-20 MED ORDER — SODIUM CHLORIDE 0.9% FLUSH
10.0000 mL | Freq: Once | INTRAVENOUS | Status: AC
  Administered 2016-11-20: 10 mL
  Filled 2016-11-20: qty 10

## 2016-11-20 MED ORDER — DEXAMETHASONE SODIUM PHOSPHATE 10 MG/ML IJ SOLN
INTRAMUSCULAR | Status: AC
  Filled 2016-11-20: qty 1

## 2016-11-20 MED ORDER — FAMOTIDINE IN NACL 20-0.9 MG/50ML-% IV SOLN
20.0000 mg | Freq: Once | INTRAVENOUS | Status: AC
  Administered 2016-11-20: 20 mg via INTRAVENOUS

## 2016-11-20 MED ORDER — DIPHENHYDRAMINE HCL 50 MG/ML IJ SOLN
25.0000 mg | Freq: Once | INTRAMUSCULAR | Status: AC
  Administered 2016-11-20: 25 mg via INTRAVENOUS

## 2016-11-20 MED FILL — HYDROMORPHONE 5 MG/5 ML SOL: 1 | 10 days supply | Qty: 473 | Fill #0

## 2016-11-20 NOTE — Patient Instructions (Signed)
[facility] Discharge Instructions for Patients Receiving Chemotherapy  Today you received the following chemotherapy agents Erbitux  To help prevent nausea and vomiting after your treatment, we encourage you to take your nausea medication as directed   If you develop nausea and vomiting that is not controlled by your nausea medication, call the clinic.   BELOW ARE SYMPTOMS THAT SHOULD BE REPORTED IMMEDIATELY:  *FEVER GREATER THAN 100.5 F  *CHILLS WITH OR WITHOUT FEVER  NAUSEA AND VOMITING THAT IS NOT CONTROLLED WITH YOUR NAUSEA MEDICATION  *UNUSUAL SHORTNESS OF BREATH  *UNUSUAL BRUISING OR BLEEDING  TENDERNESS IN MOUTH AND THROAT WITH OR WITHOUT PRESENCE OF ULCERS  *URINARY PROBLEMS  *BOWEL PROBLEMS  UNUSUAL RASH Items with * indicate a potential emergency and should be followed up as soon as possible.  Feel free to call the clinic should you have any questions or concerns. The clinic phone number is [phone].  Please show the CHEMO ALERT CARD at check-in to the Emergency Department and triage nurse.   

## 2016-11-20 NOTE — Progress Notes (Signed)
P/C to Dr Alvy Bimler and recceived okay to treat today with HR [id number].   Spoke with pharmacy as to dosage and BSA today. Per pharmacy (who spoke with Dr Alvy Bimler) , proceed with current dosage and last BSA.

## 2016-11-20 NOTE — Patient Instructions (Signed)

## 2016-11-20 NOTE — Assessment & Plan Note (Signed)
She has progressive weight loss She has almost no oral intake and is dependent on tube feed and IV fluid support I rcommend increase nutritional supplement as tolerated

## 2016-11-20 NOTE — Assessment & Plan Note (Signed)
She is developing severe pancytopenia The patient has declined blood transfusion due to religious reasons even though she is symptomatic from anemia We discussed some of the risks, benefits, and alternatives of erythropoietin stimulating agents such as Procrit or Aranesp. The patient is symptomatic from anemia and the EPO level is low. Some of the side-effects to be expected including risks of allergic reactions, skin rashes, headaches, risk of blood clots including heart attack and stroke. There is rare risks of causing growth of cancers.The patient is willing to proceed From the leukopenia and thrombocytopenia standpoint, she does not need further treatment for those as they have improved with withholding of treatment I will increase darbepoetin injection to 300 mcg, to be given every 2 weeks to keep hemoglobin greater than 10

## 2016-11-20 NOTE — Telephone Encounter (Signed)
Printed avs and calender for upcoming appointment. Per 11/20 los 

## 2016-11-20 NOTE — Assessment & Plan Note (Signed)
We discussed pain management extensively She declined addition of fentanyl patch We will continue liquid Dilaudid

## 2016-11-20 NOTE — Progress Notes (Signed)
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 Tongue, biopsy, Anterior deep margin left SQUAMOUS CELL CARCINOMA 3. Tongue, biopsy, Posterior deep margin left NEGATIVE FOR CARCINOMA 4. Tongue, biopsy, Posterior margin left SQUAMOUS CELL CARCINOMA 5. Tongue, excisional biopsy, New anterior deep margin NEGATIVE FOR CARCINOMA 6. Tongue, excisional biopsy, New posterior deep margin SMALL FOCUS OF SQUAMOUS CELL CARCINOMA (0.1 CM, ONLY PRESENT AT THE DEEPER PERMINENT SECTION) 7. Tongue, resection for tumor, Left lateral INFILTRATIVE KERATINIZED SQUAMOUS CELL CARCINOMA (3.2 CM) THE TUMOR INVADES UNDERNEATH MUSCLE OF THE TONGUE (2.0 CM, PT2) LYMPHOVASCULAR AND PERINEURAL INVASION IDENTIFIED SQUAMOUS CELL CARCINOMA PRESENTED AT (FINAL MARGINS REFER TO PART 2-6) LATERAL INKED MARGIN (1.0 CM) MEDIAL INKED MARGIN (0.6 CM)      02/08/2016 Surgery    SURGICAL PROCEDURES: 1. Left hemiglossectomy with primary closure. 2. Left selective neck dissection (zones I-III).      03/02/2016 Imaging    CT chest with contrast: No evidence of metastatic disease in the chest. 2. Aortic atherosclerosis.       03/02/2016 Imaging    CT neck with contrast showed : Newly enlarged and round 10 mm  right level 1b lymph node (series 5, image 45). The level 1A nodes remain normal. This right 1 B node is nonspecific but suspicious for progressive nodal metastatic disease. It might be amenable to Ultrasound-guided FNA. 2. Interval left hemiglossectomy and selective left neck dissection with confluent indeterminate soft tissue from the anterior left submandibular space through the left carotid space and obscuring the left level 1B, level 2, and level IIIa nodal stations. This study will serve as a new postoperative baseline of this area. Attention directed on close interval follow-up. 3. Mild soft tissue thickening also along the left lateral tongue resection margin. Attention directed on followup. 4. Chest CT today reported separately      03/14/2016 Procedure    She has normal baseline hearing test      03/15/2016 Procedure    1. Successful placement of a right internal jugular approach power injectable Port-A-Cath. The Port a catheter is ready for immediate use. 2. Successful fluoroscopic insertion of a 20-French pull-through gastrostomy tube. The gastrostomy may be used immediately for medication administration and may be utilized in 24 hrs for the initiation of feeds.      03/15/2016 - 04/30/2016 Radiation Therapy    Tongue and Bilateral Neck treated to 68 Gy in 33 fractions      03/16/2016 - 04/27/2016 Chemotherapy    She received weekly cisplatin. Cycle 2-6 reduced dose at 20 mg/m2 due to significant side-effects and poor tolerance       04/04/2016 Adverse Reaction    Chemotherapy is placed on hold due to acute renal failure      04/13/2016 Adverse Reaction    Chemo was placed on hold due to shingles outbreak and paronychia  05/29/2016 - 06/04/2016 Hospital Admission    She was admitted to the hospital for management of nausea, vomiting and inability to tolerate tube feeding      06/06/2016 - 06/13/2016 Hospital Admission    She was admitted to the hospital for management of nausea,  vomiting and inability to tolerate tube feeding      08/09/2016 Imaging    1. New bilateral upper lobe ground-glass nodules. Favor infectious or inflammatory process. Recommend close attention on routine follow-up. 2. Angular nodule RIGHT upper lobe is also favored infectious or inflammatory. Recommend attention on follow-up as above. 3. No mediastinal or supraclavicular lymphadenopathy. 4. Small RIGHT thyroid nodule does not meet biopsy criteria. Please see separate CT neck dictation.      08/09/2016 Imaging    1. Progression of disease with new/markedly worsened mass of the left base of tongue measuring 2.9 x 2.2 x 2.3 centimeters. Persistent soft tissue thickening of the postoperative left neck extending from the angle of the mandible to the level of the hyoid bone. 2. Confluent, centrally necrotic mass in the right submental region, at the site of previously seen enlarged level 1B lymph node. This mass extends into the floor of the mouth and displaces or invades the right sublingual gland and the right mylohyoid and hyoglossus muscles. 3. Centrally necrotic right level IIa lymph node is new from the prior study and consistent with metastatic spread. No other lower cervical lymphadenopathy. 4. Multiple ground-glass opacities in the lung apices. Please see dedicated report for concomitant chest CT for more complete characterization. 5. Thickening of the epiglottis and aryepiglottic folds is likely secondary to prior radiation treatment. No acute airway compromise.      08/22/2016 PET scan    1. The recently described progressive cervical disease is markedly hypermetabolic. This includes local recurrence in the left base of tongue and a confluent right submental nodal mass. In addition, there are small hypermetabolic lymph nodes on the right in station IIb and the anterior to the lower trachea.  2. No typical metastatic disease within the chest, abdomen or pelvis. 3. The recently described  ground-glass opacities in both upper lobes have enlarged, become more dense and are associated with moderate hypermetabolic activity. These are new from February and probably inflammatory/infectious. CT follow-up after appropriate therapy recommended.      08/27/2016 -  Chemotherapy    She received palliative chemo with carboplatin/5FU and cetuximab      09/11/2016 Adverse Reaction    Dose of cetuximab is reduced due to skin toxicity      10/08/2016 PET scan    1. Decrease in metabolic activity of LEFT base of tongue lesion. 2. Decrease in size and metabolic activity of right submental adenopathy/malignancy 3. Reduction in metabolic activity of RIGHT level 2 lymph node. 4. No evidence of metastatic progression chest, abdomen, pelvis. 5. Persistent bilateral upper lobe foci of consolidation. Favored inflammatory or infectious etiology and consider adverse drug reaction.       INTERVAL HISTORY: Please see below for problem oriented charting. She returns with her husband for further follow-up Her pain is reasonably controlled with Dilaudid She denies nausea constipation She has lost some weight She denies worsening skin rash Denies diarrhea or constipation The patient denies any recent signs or symptoms of bleeding such as spontaneous epistaxis, hematuria or hematochezia. She complained of weakness and fatigue  REVIEW OF SYSTEMS:   Constitutional: Denies fevers, chills Eyes: Denies blurriness of vision Ears, nose, mouth, throat, and face: Denies mucositis or  sore throat Respiratory: Denies cough, dyspnea or wheezes Cardiovascular: Denies palpitation, chest discomfort or lower extremity swelling Gastrointestinal:  Denies nausea, heartburn or change in bowel habits Lymphatics: Denies new lymphadenopathy or easy bruising Behavioral/Psych: Mood is stable, no new changes  All other systems were reviewed with the patient and are negative.  I have reviewed the past medical history,  past surgical history, social history and family history with the patient and they are unchanged from previous note.  ALLERGIES:  has No Known Allergies.  MEDICATIONS:  Current Outpatient Medications  Medication Sig Dispense Refill  . Amino Acids-Protein Hydrolys (FEEDING SUPPLEMENT, PRO-STAT SUGAR FREE 64,) LIQD Place 30 mLs into feeding tube daily. 900 mL 0  . chlorhexidine (PERIDEX) 0.12 % solution Rinse with 15 mls three times daily for 30 seconds. Use after breakfast, dinner, and at bedtime. Spit out excess. Do not swallow. 480 mL prn  . clindamycin (CLINDAGEL) 1 % gel Apply topically 2 (two) times daily. 30 g 0  . HYDROmorphone HCl (DILAUDID) 1 MG/ML LIQD Place 6 mLs (6 mg total) into feeding tube every 3 (three) hours as needed for moderate pain or severe pain. 473 mL 0  . levothyroxine (SYNTHROID, LEVOTHROID) 75 MCG tablet Take 1 tablet (75 mcg total) by mouth daily before breakfast. 30 tablet 9  . lidocaine-prilocaine (EMLA) cream APPLY TO AFFECTED AREA ONCE AS DIRECTED 30 g 3  . LORazepam (ATIVAN) 1 MG tablet Take 1 tablet (1 mg total) by mouth every 8 (eight) hours as needed for anxiety or sleep. 60 tablet 0  . mirtazapine (REMERON SOL-TAB) 15 MG disintegrating tablet Take 1 tablet (15 mg total) by mouth at bedtime. 30 tablet 9  . Nutritional Supplements (FEEDING SUPPLEMENT, VITAL 1.5 CAL,) LIQD D/C continuous feeding pump. Give Vital 1.5 or equivalent, 7 bottles daily, bolus feeding via PEG. Flush tube with 60 mL free water before and after bolus feeding QID. In addition, flush tube with 250 mL free water QID. Send formula and syringes. Pick up pump. 7 Can 4  . Nutritional Supplements (PROMOD) LIQD Give 34ml of promod daily via feeding tube (Mix 43ml of promod and 22ml of water).  Flush feeding tube with 52ml of water before giving promod and after. 946 mL 12  . OLANZapine (ZYPREXA) 5 MG tablet Place 1 tablet (5 mg total) into feeding tube at bedtime. (Patient not taking: Reported on  08/10/2016) 30 tablet 9  . ondansetron (ZOFRAN ODT) 8 MG disintegrating tablet Take 1 tablet (8 mg total) by mouth every 8 (eight) hours as needed for nausea or vomiting. (Patient not taking: Reported on 08/10/2016) 30 tablet 5  . ondansetron (ZOFRAN) 8 MG tablet TAKE 1 TABLET BY MOUTH 2 TIMES DAILY AS NEEDED FOR REFRACTORY NAUSEA / VOMITING. START ON DAY 3 AFTER CHEMO. 90 tablet 3  . polyethylene glycol (MIRALAX / GLYCOLAX) packet Place 17 g into feeding tube daily. 14 each 0  . prochlorperazine (COMPAZINE) 10 MG tablet Take 1 tablet (10 mg total) by mouth every 6 (six) hours as needed (Nausea or vomiting). 30 tablet 1  . prochlorperazine (COMPAZINE) 10 MG tablet TAKE 1 TABLET BY MOUTH EVERY 6 HOURS AS NEEDED FOR NAUSEA AND VOMITING 30 tablet 1  . senna (SENOKOT) 8.6 MG tablet Take 1 tablet by mouth 2 (two) times daily.     No current facility-administered medications for this visit.    Facility-Administered Medications Ordered in Other Visits  Medication Dose Route Frequency Provider Last Rate Last Dose  . heparin lock flush  100 unit/mL  500 Units Intracatheter Once PRN Alvy Bimler, Skiler Olden, MD      . sodium chloride flush (NS) 0.9 % injection 10 mL  10 mL Intracatheter PRN Chrishauna Mee, MD      . sodium chloride flush (NS) 0.9 % injection 10 mL  10 mL Intracatheter PRN Alvy Bimler, Judie Hollick, MD   10 mL at 11/20/16 1418    PHYSICAL EXAMINATION: ECOG PERFORMANCE STATUS: 2 - Symptomatic, <50% confined to bed  Vitals:   11/20/16 1002  BP: 132/83  Pulse: (!) 124  Resp: 18  Temp: 98.7 F (37.1 C)  SpO2: 100%   Filed Weights   11/20/16 1002  Weight: 164 lb 12.8 oz (74.8 kg)    GENERAL:alert, no distress and comfortable SKIN: She has persistent acneform rash on her face EYES: normal, Conjunctiva are pink and non-injected, sclera clear OROPHARYNX:no exudate, no erythema and lips, buccal mucosa, and tongue resection changes.  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   lYMPH:  no palpable lymphadenopathy in the cervical, axillary or inguinal LUNGS: clear to auscultation and percussion with normal breathing effort HEART: regular rate & rhythm and no murmurs and no lower extremity edema ABDOMEN:abdomen soft, non-tender and normal bowel sounds Musculoskeletal:no cyanosis of digits and no clubbing  NEURO: alert & oriented x 3 with fluent speech, no focal motor/sensory deficits  LABORATORY DATA:  I have reviewed the data as listed    Component Value Date/Time   NA 140 11/20/2016 0933   K 3.7 11/20/2016 0933   CL 101 06/12/2016 0540   CO2 25 11/20/2016 0933   GLUCOSE 111 11/20/2016 0933   BUN 18.9 11/20/2016 0933   CREATININE 1.2 (H) 11/20/2016 0933   CALCIUM 9.8 11/20/2016 0933   PROT 8.0 11/20/2016 0933   ALBUMIN 3.7 11/20/2016 0933   AST 23 11/20/2016 0933   ALT 13 11/20/2016 0933   ALKPHOS 82 11/20/2016 0933   BILITOT 0.69 11/20/2016 0933   GFRNONAA 56 (L) 06/12/2016 0540   GFRAA >60 06/12/2016 0540    No results found for: SPEP, UPEP  Lab Results  Component Value Date   WBC 3.4 (L) 11/20/2016   NEUTROABS 2.7 11/20/2016   HGB 8.7 (L) 11/20/2016   HCT 25.2 (L) 11/20/2016   MCV 96.8 11/20/2016   PLT 220 11/20/2016      Chemistry      Component Value Date/Time   NA 140 11/20/2016 0933   K 3.7 11/20/2016 0933   CL 101 06/12/2016 0540   CO2 25 11/20/2016 0933   BUN 18.9 11/20/2016 0933   CREATININE 1.2 (H) 11/20/2016 0933      Component Value Date/Time   CALCIUM 9.8 11/20/2016 0933   ALKPHOS 82 11/20/2016 0933   AST 23 11/20/2016 0933   ALT 13 11/20/2016 0933   BILITOT 0.69 11/20/2016 0933       ASSESSMENT & PLAN:  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[place]) Recent imaging studies show positive response to treatment However, due to persistent pancytopenia, I had to delay chemotherapy I will continue close follow-up with weekly blood draw Due to her frail status, she will continue regular home visit with additional IV  fluid support and symptom management at [facility]  Pancytopenia, acquired [facility]) She is developing severe pancytopenia The patient has declined blood transfusion due to religious reasons even though she is symptomatic from anemia We discussed some of the risks, benefits, and alternatives of erythropoietin stimulating agents  such as Procrit or Aranesp. The patient is symptomatic from anemia and the EPO level is low. Some of the side-effects to be expected including risks of allergic reactions, skin rashes, headaches, risk of blood clots including heart attack and stroke. There is rare risks of causing growth of cancers.The patient is willing to proceed From the leukopenia and thrombocytopenia standpoint, she does not need further treatment for those as they have improved with withholding of treatment I will increase darbepoetin injection to 300 mcg, to be given every 2 weeks to keep hemoglobin greater than 10  Cancer associated pain We discussed pain management extensively She declined addition of fentanyl patch We will continue liquid Dilaudid  Weight loss She has progressive weight loss She has almost no oral intake and is dependent on tube feed and IV fluid support I rcommend increase nutritional supplement as tolerated   No orders of the defined types were placed in this encounter.  All questions were answered. The patient knows to call the clinic with any problems, questions or concerns. No barriers to learning was detected. I spent 20 minutes counseling the patient face to face. The total time spent in the appointment was 30 minutes and more than 50% was on counseling and review of test results     [name], MD 11/20/2016 5:22 PM

## 2016-11-20 NOTE — Assessment & Plan Note (Signed)
Recent imaging studies show positive response to treatment However, due to persistent pancytopenia, I had to delay chemotherapy I will continue close follow-up with weekly blood draw Due to her frail status, she will continue regular home visit with additional IV fluid support and symptom management at home through [facility]

## 2016-11-22 ENCOUNTER — Other Ambulatory Visit: Payer: Self-pay | Admitting: Hematology and Oncology

## 2016-11-23 ENCOUNTER — Other Ambulatory Visit: Payer: Self-pay | Admitting: *Deleted

## 2016-11-23 MED FILL — OLANZapine 5 MG TABS: 5 | 30 days supply | Qty: 30 | Fill #5

## 2016-11-27 ENCOUNTER — Ambulatory Visit (HOSPITAL_BASED_OUTPATIENT_CLINIC_OR_DEPARTMENT_OTHER): Payer: 59

## 2016-11-27 ENCOUNTER — Other Ambulatory Visit (HOSPITAL_BASED_OUTPATIENT_CLINIC_OR_DEPARTMENT_OTHER): Payer: 59

## 2016-11-27 VITALS — BP 106/72 | HR 110 | Temp 99.2°F | Resp 17

## 2016-11-27 DIAGNOSIS — C023 Malignant neoplasm of anterior two-thirds of tongue, part unspecified: Secondary | ICD-10-CM

## 2016-11-27 DIAGNOSIS — R634 Abnormal weight loss: Secondary | ICD-10-CM

## 2016-11-27 DIAGNOSIS — D539 Nutritional anemia, unspecified: Secondary | ICD-10-CM

## 2016-11-27 LAB — CBC WITH DIFFERENTIAL/PLATELET
BASO%: 0.3 % (ref 0.0–2.0)
BASOS ABS: 0 10*3/uL (ref 0.0–0.1)
EOS ABS: 0.2 10*3/uL (ref 0.0–0.5)
EOS%: 5.1 % (ref 0.0–7.0)
HEMATOCRIT: 21.8 % — AB (ref 34.8–46.6)
HEMOGLOBIN: 7.6 g/dL — AB (ref 11.6–15.9)
LYMPH%: 17.7 % (ref 14.0–49.7)
MCH: 33 pg (ref 25.1–34.0)
MCHC: 34.9 g/dL (ref 31.5–36.0)
MCV: 94.8 fL (ref 79.5–101.0)
MONO#: 0.3 10*3/uL (ref 0.1–0.9)
MONO%: 8.7 % (ref 0.0–14.0)
NEUT#: 2.1 10*3/uL (ref 1.5–6.5)
NEUT%: 68.2 % (ref 38.4–76.8)
NRBC: 0 % (ref 0–0)
PLATELETS: 72 10*3/uL — AB (ref 145–400)
RBC: 2.3 10*6/uL — AB (ref 3.70–5.45)
RDW: 25.6 % — AB (ref 11.2–14.5)
WBC: 3.1 10*3/uL — ABNORMAL LOW (ref 3.9–10.3)
lymph#: 0.6 10*3/uL — ABNORMAL LOW (ref 0.9–3.3)

## 2016-11-27 LAB — COMPREHENSIVE METABOLIC PANEL
ALT: 15 U/L (ref 0–55)
ANION GAP: 8 meq/L (ref 3–11)
AST: 22 U/L (ref 5–34)
Albumin: 3.5 g/dL (ref 3.5–5.0)
Alkaline Phosphatase: 81 U/L (ref 40–150)
BUN: 21.1 mg/dL (ref 7.0–26.0)
CHLORIDE: 104 meq/L (ref 98–109)
CO2: 26 meq/L (ref 22–29)
CREATININE: 1.2 mg/dL — AB (ref 0.6–1.1)
Calcium: 9.3 mg/dL (ref 8.4–10.4)
EGFR: 58 mL/min/{1.73_m2} — ABNORMAL LOW (ref >60)
Glucose: 99 mg/dl (ref 70–140)
Potassium: 4 mEq/L (ref 3.5–5.1)
Sodium: 138 mEq/L (ref 136–145)
Total Bilirubin: 0.37 mg/dL (ref 0.20–1.20)
Total Protein: 7.2 g/dL (ref 6.4–8.3)

## 2016-11-27 LAB — MAGNESIUM: MAGNESIUM: 1.7 mg/dL (ref 1.5–2.5)

## 2016-11-27 MED ORDER — SODIUM CHLORIDE 0.9 % IV SOLN
Freq: Once | INTRAVENOUS | Status: AC
  Administered 2016-11-27: 11:00:00 via INTRAVENOUS

## 2016-11-27 MED ORDER — SODIUM CHLORIDE 0.9% FLUSH
10.0000 mL | INTRAVENOUS | Status: DC | PRN
  Administered 2016-11-27: 10 mL
  Filled 2016-11-27: qty 10

## 2016-11-27 MED ORDER — HEPARIN SOD (PORK) LOCK FLUSH 100 UNIT/ML IV SOLN
500.0000 [IU] | Freq: Once | INTRAVENOUS | Status: AC | PRN
  Administered 2016-11-27: 500 [IU]
  Filled 2016-11-27: qty 5

## 2016-11-27 MED FILL — PROCHLORPERAZINE 10 MG TAB: 10 | 7 days supply | Qty: 30 | Fill #1

## 2016-11-27 NOTE — Patient Instructions (Signed)
Dehydration, Adult Dehydration is when there is not enough fluid or water in your body. This happens when you lose more fluids than you take in. Dehydration can range from mild to very bad. It should be treated right away to keep it from getting very bad. Symptoms of mild dehydration may include:  Thirst.  Dry lips.  Slightly dry mouth.  Dry, warm skin.  Dizziness. Symptoms of moderate dehydration may include:  Very dry mouth.  Muscle cramps.  Dark pee (urine). Pee may be the color of tea.  Your body making less pee.  Your eyes making fewer tears.  Heartbeat that is uneven or faster than normal (palpitations).  Headache.  Light-headedness, especially when you stand up from sitting.  Fainting (syncope). Symptoms of very bad dehydration may include:  Changes in skin, such as: ? Cold and clammy skin. ? Blotchy (mottled) or pale skin. ? Skin that does not quickly return to normal after being lightly pinched and let go (poor skin turgor).  Changes in body fluids, such as: ? Feeling very thirsty. ? Your eyes making fewer tears. ? Not sweating when body temperature is high, such as in hot weather. ? Your body making very little pee.  Changes in vital signs, such as: ? Weak pulse. ? Pulse that is more than 100 beats a minute when you are sitting still. ? Fast breathing. ? Low blood pressure.  Other changes, such as: ? Sunken eyes. ? Cold hands and feet. ? Confusion. ? Lack of energy (lethargy). ? Trouble waking up from sleep. ? Short-term weight loss. ? Unconsciousness. Follow these instructions at home:  If told by your doctor, drink an ORS: ? Make an ORS by using instructions on the package. ? Start by drinking small amounts, about  cup (120 mL) every 5-10 minutes. ? Slowly drink more until you have had the amount that your doctor said to have.  Drink enough clear fluid to keep your pee clear or pale yellow. If you were told to drink an ORS, finish the ORS  first, then start slowly drinking clear fluids. Drink fluids such as: ? Water. Do not drink only water by itself. Doing that can make the salt (sodium) level in your body get too low (hyponatremia). ? Ice chips. ? Fruit juice that you have added water to (diluted). ? Low-calorie sports drinks.  Avoid: ? Alcohol. ? Drinks that have a lot of sugar. These include high-calorie sports drinks, fruit juice that does not have water added, and soda. ? Caffeine. ? Foods that are greasy or have a lot of fat or sugar.  Take over-the-counter and prescription medicines only as told by your doctor.  Do not take salt tablets. Doing that can make the salt level in your body get too high (hypernatremia).  Eat foods that have minerals (electrolytes). Examples include bananas, oranges, potatoes, tomatoes, and spinach.  Keep all follow-up visits as told by your doctor. This is important. Contact a doctor if:  You have belly (abdominal) pain that: ? Gets worse. ? Stays in one area (localizes).  You have a rash.  You have a stiff neck.  You get angry or annoyed more easily than normal (irritability).  You are more sleepy than normal.  You have a harder time waking up than normal.  You feel: ? Weak. ? Dizzy. ? Very thirsty.  You have peed (urinated) only a small amount of very dark pee during 6-8 hours. Get help right away if:  You have symptoms of   very bad dehydration.  You cannot drink fluids without throwing up (vomiting).  Your symptoms get worse with treatment.  You have a fever.  You have a very bad headache.  You are throwing up or having watery poop (diarrhea) and it: ? Gets worse. ? Does not go away.  You have blood or something green (bile) in your throw-up.  You have blood in your poop (stool). This may cause poop to look black and tarry.  You have not peed in 6-8 hours.  You pass out (faint).  Your heart rate when you are sitting still is more than 100 beats a  minute.  You have trouble breathing. This information is not intended to replace advice given to you by your health care provider. Make sure you discuss any questions you have with your health care provider. Document Released: 10/14/2008 Document Revised: 07/08/2015 Document Reviewed: 02/11/2015 Elsevier Interactive Patient Education  2018 [employer]  

## 2016-12-04 ENCOUNTER — Ambulatory Visit (HOSPITAL_BASED_OUTPATIENT_CLINIC_OR_DEPARTMENT_OTHER): Payer: 59 | Admitting: Hematology and Oncology

## 2016-12-04 ENCOUNTER — Ambulatory Visit: Payer: 59

## 2016-12-04 ENCOUNTER — Other Ambulatory Visit (HOSPITAL_BASED_OUTPATIENT_CLINIC_OR_DEPARTMENT_OTHER): Payer: 59

## 2016-12-04 ENCOUNTER — Telehealth: Payer: Self-pay | Admitting: Hematology and Oncology

## 2016-12-04 ENCOUNTER — Encounter: Payer: Self-pay | Admitting: Hematology and Oncology

## 2016-12-04 VITALS — BP 114/63 | HR 110 | Temp 98.9°F | Resp 16 | Ht 68.0 in | Wt 174.5 lb

## 2016-12-04 DIAGNOSIS — D61818 Other pancytopenia: Secondary | ICD-10-CM

## 2016-12-04 DIAGNOSIS — C023 Malignant neoplasm of anterior two-thirds of tongue, part unspecified: Secondary | ICD-10-CM

## 2016-12-04 DIAGNOSIS — I890 Lymphedema, not elsewhere classified: Secondary | ICD-10-CM | POA: Diagnosis not present

## 2016-12-04 DIAGNOSIS — D631 Anemia in chronic kidney disease: Secondary | ICD-10-CM | POA: Diagnosis not present

## 2016-12-04 DIAGNOSIS — R1310 Dysphagia, unspecified: Secondary | ICD-10-CM

## 2016-12-04 DIAGNOSIS — N189 Chronic kidney disease, unspecified: Secondary | ICD-10-CM | POA: Diagnosis not present

## 2016-12-04 DIAGNOSIS — D539 Nutritional anemia, unspecified: Secondary | ICD-10-CM

## 2016-12-04 LAB — CBC WITH DIFFERENTIAL/PLATELET
BASO%: 0.5 % (ref 0.0–2.0)
Basophils Absolute: 0 10*3/uL (ref 0.0–0.1)
EOS%: 5.7 % (ref 0.0–7.0)
Eosinophils Absolute: 0.1 10*3/uL (ref 0.0–0.5)
HEMATOCRIT: 22.3 % — AB (ref 34.8–46.6)
HEMOGLOBIN: 7.7 g/dL — AB (ref 11.6–15.9)
LYMPH#: 0.6 10*3/uL — AB (ref 0.9–3.3)
LYMPH%: 27 % (ref 14.0–49.7)
MCH: 34.2 pg — ABNORMAL HIGH (ref 25.1–34.0)
MCHC: 34.5 g/dL (ref 31.5–36.0)
MCV: 99.1 fL (ref 79.5–101.0)
MONO#: 0.3 10*3/uL (ref 0.1–0.9)
MONO%: 14.2 % — ABNORMAL HIGH (ref 0.0–14.0)
NEUT#: 1.1 10*3/uL — ABNORMAL LOW (ref 1.5–6.5)
NEUT%: 52.6 % (ref 38.4–76.8)
Platelets: 110 10*3/uL — ABNORMAL LOW (ref 145–400)
RBC: 2.25 10*6/uL — ABNORMAL LOW (ref 3.70–5.45)
RDW: 26 % — AB (ref 11.2–14.5)
WBC: 2.1 10*3/uL — ABNORMAL LOW (ref 3.9–10.3)

## 2016-12-04 LAB — COMPREHENSIVE METABOLIC PANEL
ALBUMIN: 3.5 g/dL (ref 3.5–5.0)
ALK PHOS: 74 U/L (ref 40–150)
ALT: 10 U/L (ref 0–55)
ANION GAP: 7 meq/L (ref 3–11)
AST: 19 U/L (ref 5–34)
BILIRUBIN TOTAL: 0.35 mg/dL (ref 0.20–1.20)
BUN: 24.9 mg/dL (ref 7.0–26.0)
CO2: 26 mEq/L (ref 22–29)
Calcium: 9 mg/dL (ref 8.4–10.4)
Chloride: 105 mEq/L (ref 98–109)
Creatinine: 1.1 mg/dL (ref 0.6–1.1)
Glucose: 104 mg/dl (ref 70–140)
Potassium: 4 mEq/L (ref 3.5–5.1)
Sodium: 138 mEq/L (ref 136–145)
TOTAL PROTEIN: 7 g/dL (ref 6.4–8.3)

## 2016-12-04 LAB — MAGNESIUM: MAGNESIUM: 1.7 mg/dL (ref 1.5–2.5)

## 2016-12-04 MED ORDER — SODIUM CHLORIDE 0.9% FLUSH
10.0000 mL | Freq: Once | INTRAVENOUS | Status: AC
  Administered 2016-12-04: 10 mL
  Filled 2016-12-04: qty 10

## 2016-12-04 MED ORDER — DARBEPOETIN ALFA 300 MCG/0.6ML IJ SOSY
300.0000 ug | PREFILLED_SYRINGE | Freq: Once | INTRAMUSCULAR | Status: AC
  Administered 2016-12-04: 300 ug via SUBCUTANEOUS
  Filled 2016-12-04: qty 0.6

## 2016-12-04 NOTE — Assessment & Plan Note (Signed)
She has persistent lymphedema around her neck Will observe for now

## 2016-12-04 NOTE — Telephone Encounter (Signed)
Gave avs and calendar for december °

## 2016-12-04 NOTE — Assessment & Plan Note (Signed)
She has acquired, severe pancytopenia due to recent chemotherapy Due to her religious belief, the patient has declined blood transfusion We will stop chemotherapy as above She will continue darbepoetin injection every 2 weeks to keep hemoglobin greater than 10

## 2016-12-04 NOTE — Assessment & Plan Note (Signed)
She has chronic dysphagia since radiation treatment She does not perform any regular swallowing exercises and has declined eating or drinking liquids for fear that she may choke Her husband requested referral to speech and swallow therapist for further evaluation and I think it is reasonable

## 2016-12-04 NOTE — Assessment & Plan Note (Signed)
The patient had persistent pancytopenia She is quite tired of treatment We discussed the risk and benefits of discontinuation of chemotherapy and she is interested to stop treatment for now I recommend we focus on supportive care for now I will see her every [redacted] weeks along with blood draw and darbepoetin injection for supportive care She will continue aggressive supportive care with IV fluids through [facility]

## 2016-12-04 NOTE — Progress Notes (Signed)
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 Tongue, biopsy, Anterior deep margin left SQUAMOUS CELL CARCINOMA 3. Tongue, biopsy, Posterior deep margin left NEGATIVE FOR CARCINOMA 4. Tongue, biopsy, Posterior margin left SQUAMOUS CELL CARCINOMA 5. Tongue, excisional biopsy, New anterior deep margin NEGATIVE FOR CARCINOMA 6. Tongue, excisional biopsy, New posterior deep margin SMALL FOCUS OF SQUAMOUS CELL CARCINOMA (0.1 CM, ONLY PRESENT AT THE DEEPER PERMINENT SECTION) 7. Tongue, resection for tumor, Left lateral INFILTRATIVE KERATINIZED SQUAMOUS CELL CARCINOMA (3.2 CM) THE TUMOR INVADES UNDERNEATH MUSCLE OF THE TONGUE (2.0 CM, PT2) LYMPHOVASCULAR AND PERINEURAL INVASION IDENTIFIED SQUAMOUS CELL CARCINOMA PRESENTED AT (FINAL MARGINS REFER TO PART 2-6) LATERAL INKED MARGIN (1.0 CM) MEDIAL INKED MARGIN (0.6 CM)      02/08/2016 Surgery    SURGICAL PROCEDURES: 1. Left hemiglossectomy with primary closure. 2. Left selective neck dissection (zones I-III).      03/02/2016 Imaging    CT chest with contrast: No evidence of metastatic disease in the chest. 2. Aortic atherosclerosis.       03/02/2016 Imaging    CT neck with contrast showed : Newly enlarged and round 10 mm  right level 1b lymph node (series 5, image 45). The level 1A nodes remain normal. This right 1 B node is nonspecific but suspicious for progressive nodal metastatic disease. It might be amenable to Ultrasound-guided FNA. 2. Interval left hemiglossectomy and selective left neck dissection with confluent indeterminate soft tissue from the anterior left submandibular space through the left carotid space and obscuring the left level 1B, level 2, and level IIIa nodal stations. This study will serve as a new postoperative baseline of this area. Attention directed on close interval follow-up. 3. Mild soft tissue thickening also along the left lateral tongue resection margin. Attention directed on followup. 4. Chest CT today reported separately      03/14/2016 Procedure    She has normal baseline hearing test      03/15/2016 Procedure    1. Successful placement of a right internal jugular approach power injectable Port-A-Cath. The Port a catheter is ready for immediate use. 2. Successful fluoroscopic insertion of a 20-French pull-through gastrostomy tube. The gastrostomy may be used immediately for medication administration and may be utilized in 24 hrs for the initiation of feeds.      03/15/2016 - 04/30/2016 Radiation Therapy    Tongue and Bilateral Neck treated to 68 Gy in 33 fractions      03/16/2016 - 04/27/2016 Chemotherapy    She received weekly cisplatin. Cycle 2-6 reduced dose at 20 mg/m2 due to significant side-effects and poor tolerance       04/04/2016 Adverse Reaction    Chemotherapy is placed on hold due to acute renal failure      04/13/2016 Adverse Reaction    Chemo was placed on hold due to shingles outbreak and paronychia  05/29/2016 - 06/04/2016 Hospital Admission    She was admitted to the hospital for management of nausea, vomiting and inability to tolerate tube feeding      06/06/2016 - 06/13/2016 Hospital Admission    She was admitted to the hospital for management of nausea,  vomiting and inability to tolerate tube feeding      08/09/2016 Imaging    1. New bilateral upper lobe ground-glass nodules. Favor infectious or inflammatory process. Recommend close attention on routine follow-up. 2. Angular nodule RIGHT upper lobe is also favored infectious or inflammatory. Recommend attention on follow-up as above. 3. No mediastinal or supraclavicular lymphadenopathy. 4. Small RIGHT thyroid nodule does not meet biopsy criteria. Please see separate CT neck dictation.      08/09/2016 Imaging    1. Progression of disease with new/markedly worsened mass of the left base of tongue measuring 2.9 x 2.2 x 2.3 centimeters. Persistent soft tissue thickening of the postoperative left neck extending from the angle of the mandible to the level of the hyoid bone. 2. Confluent, centrally necrotic mass in the right submental region, at the site of previously seen enlarged level 1B lymph node. This mass extends into the floor of the mouth and displaces or invades the right sublingual gland and the right mylohyoid and hyoglossus muscles. 3. Centrally necrotic right level IIa lymph node is new from the prior study and consistent with metastatic spread. No other lower cervical lymphadenopathy. 4. Multiple ground-glass opacities in the lung apices. Please see dedicated report for concomitant chest CT for more complete characterization. 5. Thickening of the epiglottis and aryepiglottic folds is likely secondary to prior radiation treatment. No acute airway compromise.      08/22/2016 PET scan    1. The recently described progressive cervical disease is markedly hypermetabolic. This includes local recurrence in the left base of tongue and a confluent right submental nodal mass. In addition, there are small hypermetabolic lymph nodes on the right in station IIb and the anterior to the lower trachea.  2. No typical metastatic disease within the chest, abdomen or pelvis. 3. The recently described  ground-glass opacities in both upper lobes have enlarged, become more dense and are associated with moderate hypermetabolic activity. These are new from February and probably inflammatory/infectious. CT follow-up after appropriate therapy recommended.      08/27/2016 -  Chemotherapy    She received palliative chemo with carboplatin/5FU and cetuximab      09/11/2016 Adverse Reaction    Dose of cetuximab is reduced due to skin toxicity      10/08/2016 PET scan    1. Decrease in metabolic activity of LEFT base of tongue lesion. 2. Decrease in size and metabolic activity of right submental adenopathy/malignancy 3. Reduction in metabolic activity of RIGHT level 2 lymph node. 4. No evidence of metastatic progression chest, abdomen, pelvis. 5. Persistent bilateral upper lobe foci of consolidation. Favored inflammatory or infectious etiology and consider adverse drug reaction.       INTERVAL HISTORY: Please see below for problem oriented charting. She returns for further follow-up She complained of fatigue Denies recent infection She had no further weight loss No nausea or vomiting Her pain appears to be well controlled Her husband is interested to discuss discontinuation of treatment due to poor quality of life He would also like her to be seen by speech and language therapist to reevaluate her swallowing function as the patient has not eaten or drink by mouth She is reliant on her feeding tube for  nutritional support and IV fluids for hydration on the regular basis  REVIEW OF SYSTEMS:   Constitutional: Denies fevers, chills or abnormal weight loss Eyes: Denies blurriness of vision Ears, nose, mouth, throat, and face: Denies mucositis or sore throat Respiratory: Denies cough, dyspnea or wheezes Cardiovascular: Denies palpitation, chest discomfort or lower extremity swelling Gastrointestinal:  Denies nausea, heartburn or change in bowel habits Skin: Denies abnormal skin  rashes Lymphatics: Denies new lymphadenopathy or easy bruising Neurological:Denies numbness, tingling or new weaknesses Behavioral/Psych: Mood is stable, no new changes  All other systems were reviewed with the patient and are negative.  I have reviewed the past medical history, past surgical history, social history and family history with the patient and they are unchanged from previous note.  ALLERGIES:  has No Known Allergies.  MEDICATIONS:  Current Outpatient Medications  Medication Sig Dispense Refill  . Amino Acids-Protein Hydrolys (FEEDING SUPPLEMENT, PRO-STAT SUGAR FREE 64,) LIQD Place 30 mLs into feeding tube daily. 900 mL 0  . chlorhexidine (PERIDEX) 0.12 % solution Rinse with 15 mls three times daily for 30 seconds. Use after breakfast, dinner, and at bedtime. Spit out excess. Do not swallow. 480 mL prn  . clindamycin (CLINDAGEL) 1 % gel Apply topically 2 (two) times daily. 30 g 0  . HYDROmorphone HCl (DILAUDID) 1 MG/ML LIQD Place 6 mLs (6 mg total) into feeding tube every 3 (three) hours as needed for moderate pain or severe pain. 473 mL 0  . levothyroxine (SYNTHROID, LEVOTHROID) 75 MCG tablet Take 1 tablet (75 mcg total) by mouth daily before breakfast. 30 tablet 9  . lidocaine-prilocaine (EMLA) cream APPLY TO AFFECTED AREA ONCE AS DIRECTED 30 g 3  . LORazepam (ATIVAN) 1 MG tablet TAKE 1 TABLET BY MOUTH EVERY 8 HOURS AS NEEDED 60 tablet 0  . mirtazapine (REMERON SOL-TAB) 15 MG disintegrating tablet Take 1 tablet (15 mg total) by mouth at bedtime. 30 tablet 9  . Nutritional Supplements (FEEDING SUPPLEMENT, VITAL 1.5 CAL,) LIQD D/C continuous feeding pump. Give Vital 1.5 or equivalent, 7 bottles daily, bolus feeding via PEG. Flush tube with 60 mL free water before and after bolus feeding QID. In addition, flush tube with 250 mL free water QID. Send formula and syringes. Pick up pump. 7 Can 4  . Nutritional Supplements (PROMOD) LIQD Give 59ml of promod daily via feeding tube (Mix 57ml  of promod and 61ml of water).  Flush feeding tube with 66ml of water before giving promod and after. 946 mL 12  . OLANZapine (ZYPREXA) 5 MG tablet Place 1 tablet (5 mg total) into feeding tube at bedtime. (Patient not taking: Reported on 08/10/2016) 30 tablet 9  . ondansetron (ZOFRAN ODT) 8 MG disintegrating tablet Take 1 tablet (8 mg total) by mouth every 8 (eight) hours as needed for nausea or vomiting. (Patient not taking: Reported on 08/10/2016) 30 tablet 5  . ondansetron (ZOFRAN) 8 MG tablet TAKE 1 TABLET BY MOUTH 2 TIMES DAILY AS NEEDED FOR REFRACTORY NAUSEA / VOMITING. START ON DAY 3 AFTER CHEMO. 90 tablet 3  . polyethylene glycol (MIRALAX / GLYCOLAX) packet Place 17 g into feeding tube daily. 14 each 0  . prochlorperazine (COMPAZINE) 10 MG tablet Take 1 tablet (10 mg total) by mouth every 6 (six) hours as needed (Nausea or vomiting). 30 tablet 1  . prochlorperazine (COMPAZINE) 10 MG tablet TAKE 1 TABLET BY MOUTH EVERY 6 HOURS AS NEEDED FOR NAUSEA AND VOMITING 30 tablet 1  . senna (SENOKOT) 8.6 MG tablet Take 1  tablet by mouth 2 (two) times daily.     No current facility-administered medications for this visit.    Facility-Administered Medications Ordered in Other Visits  Medication Dose Route Frequency Provider Last Rate Last Dose  . heparin lock flush 100 unit/mL  500 Units Intracatheter Once PRN Alvy Bimler, Nylan Nevel, MD      . sodium chloride flush (NS) 0.9 % injection 10 mL  10 mL Intracatheter PRN Alvy Bimler, Vonnie Ligman, MD        PHYSICAL EXAMINATION: ECOG PERFORMANCE STATUS: 2 - Symptomatic, <50% confined to bed  Vitals:   12/04/16 1256  BP: 114/63  Pulse: (!) 110  Resp: 16  Temp: 98.9 F (37.2 C)  SpO2: 100%   Filed Weights   12/04/16 1256  Weight: 174 lb 8 oz (79.2 kg)    GENERAL:alert, no distress and comfortable SKIN: skin color, texture, turgor are normal, no rashes or significant lesions EYES: normal, Conjunctiva are pink and non-injected, sclera clear OROPHARYNX:no exudate, no  erythema and lips, buccal mucosa, and tongue resection changes NECK: Her neck is woody from prior radiation LYMPH:  no palpable lymphadenopathy in the cervical, axillary or inguinal LUNGS: clear to auscultation and percussion with normal breathing effort HEART: regular rate & rhythm and no murmurs and no lower extremity edema ABDOMEN:abdomen soft, non-tender and normal bowel sounds Musculoskeletal:no cyanosis of digits and no clubbing  NEURO: alert & oriented x 3 with dysarthria, no focal motor/sensory deficits  LABORATORY DATA:  I have reviewed the data as listed    Component Value Date/Time   NA 138 12/04/2016 1213   K 4.0 12/04/2016 1213   CL 101 06/12/2016 0540   CO2 26 12/04/2016 1213   GLUCOSE 104 12/04/2016 1213   BUN 24.9 12/04/2016 1213   CREATININE 1.1 12/04/2016 1213   CALCIUM 9.0 12/04/2016 1213   PROT 7.0 12/04/2016 1213   ALBUMIN 3.5 12/04/2016 1213   AST 19 12/04/2016 1213   ALT 10 12/04/2016 1213   ALKPHOS 74 12/04/2016 1213   BILITOT 0.35 12/04/2016 1213   GFRNONAA 56 (L) 06/12/2016 0540   GFRAA >60 06/12/2016 0540    No results found for: SPEP, UPEP  Lab Results  Component Value Date   WBC 2.1 (L) 12/04/2016   NEUTROABS 1.1 (L) 12/04/2016   HGB 7.7 (L) 12/04/2016   HCT 22.3 (L) 12/04/2016   MCV 99.1 12/04/2016   PLT 110 (L) 12/04/2016      Chemistry      Component Value Date/Time   NA 138 12/04/2016 1213   K 4.0 12/04/2016 1213   CL 101 06/12/2016 0540   CO2 26 12/04/2016 1213   BUN 24.9 12/04/2016 1213   CREATININE 1.1 12/04/2016 1213      Component Value Date/Time   CALCIUM 9.0 12/04/2016 1213   ALKPHOS 74 12/04/2016 1213   AST 19 12/04/2016 1213   ALT 10 12/04/2016 1213   BILITOT 0.35 12/04/2016 1213       ASSESSMENT & PLAN:  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[place]) The patient had persistent pancytopenia She is quite tired of treatment We discussed the risk and benefits of discontinuation of chemotherapy and she is  interested to stop treatment for now I recommend we focus on supportive care for now I will see her every [redacted] weeks along with blood draw and darbepoetin injection for supportive care She will continue aggressive supportive care with IV fluids through advanced home care service  Pancytopenia, acquired [facility]) She has acquired, severe pancytopenia due to recent  chemotherapy Due to her religious belief, the patient has declined blood transfusion We will stop chemotherapy as above She will continue darbepoetin injection every 2 weeks to keep hemoglobin greater than 10  Dysphagia She has chronic dysphagia since radiation treatment She does not perform any regular swallowing exercises and has declined eating or drinking liquids for fear that she may choke Her husband requested referral to speech and swallow therapist for further evaluation and I think it is reasonable  Acquired lymphedema She has persistent lymphedema around her neck Will observe for now   Orders Placed This Encounter  Procedures  . Ambulatory Referral to Speech Therapy  (specifically to Garald Balding)    Referral Priority:   Routine    Referral Type:   Speech Therapy    Referral Reason:   Specialty Services Required    Requested Specialty:   Speech Pathology    Number of Visits Requested:   1  . [place] COMMUNICATION LAB    Lab appointment 15 min.  Marland Kitchen SCHEDULING COMMUNICATION INJECTION    Schedule injection appointment 15 min  . ARANESP TREATMENT CONDITION    Hold Aranesp: Chemotherapy Induced Anemia hold for Hemoglobin greater than 10 / Renal hold for Hemoglobin greater than 11   All questions were answered. The patient knows to call the clinic with any problems, questions or concerns. No barriers to learning was detected. I spent 25 minutes counseling the patient face to face. The total time spent in the appointment was 30 minutes and more than 50% was on counseling and review of test results     [name],  MD 12/04/2016 4:09 PM

## 2016-12-11 ENCOUNTER — Other Ambulatory Visit: Payer: Self-pay

## 2016-12-11 ENCOUNTER — Ambulatory Visit: Payer: Self-pay

## 2016-12-17 ENCOUNTER — Other Ambulatory Visit: Payer: Self-pay | Admitting: Hematology and Oncology

## 2016-12-17 ENCOUNTER — Ambulatory Visit: Payer: 59 | Attending: Hematology and Oncology

## 2016-12-17 ENCOUNTER — Other Ambulatory Visit: Payer: Self-pay

## 2016-12-17 DIAGNOSIS — C023 Malignant neoplasm of anterior two-thirds of tongue, part unspecified: Secondary | ICD-10-CM

## 2016-12-17 DIAGNOSIS — R1310 Dysphagia, unspecified: Secondary | ICD-10-CM

## 2016-12-17 DIAGNOSIS — R471 Dysarthria and anarthria: Secondary | ICD-10-CM | POA: Insufficient documentation

## 2016-12-17 MED FILL — MIRTAZAPINE 15 MG ODT: 15 | 30 days supply | Qty: 30 | Fill #4

## 2016-12-17 MED FILL — PROCHLORPERAZINE 10 MG TAB: 10 | 7 days supply | Qty: 30 | Fill #0

## 2016-12-17 NOTE — Patient Instructions (Addendum)
SWALLOWING EXERCISES Complete with warm moist compress  1. Effortful Swallows - Press your tongue against the roof of your mouth for 3 seconds, then squeeze          the muscles in your neck while you swallow your saliva or a sip of water - Repeat 20 times, 2-3 times a day, and use whenever you eat or drink  2. Shaker Exercise - head lift - Lie flat on your back in your bed or on a couch without pillows - Raise your head and look at your feet - KEEP YOUR SHOULDERS DOWN - HOLD FOR 45-60 SECONDS, then lower your head back down - Repeat 3 times, 2-3 times a day  3. Mendelsohn Maneuver - "half swallow" exercise - Start to swallow, and keep your Adam's apple up by squeezing hard with the            muscles of the throat - Hold the squeeze for 5-7 seconds and then relax - Repeat 20 times, 2-3 times a day *use a wet spoon if your mouth gets dry*  4. Breath Hold - Say "HUH!" loudly, then hold your breath for 3 seconds at your voice box - Repeat 20 times, 2-3 times a day  5. Chin pushback - Open your mouth  - Place your fist UNDER your chin near your neck, and push back with your fist for 5 seconds - Repeat 10 times, 2-3 times a day  ==========================================  Use ice chips (1/2 cup) with head turn right and effortful swallow, after thorough oral care. You should use a warm moist compress 10-15 minutes prior to this.
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
## 2016-12-18 ENCOUNTER — Ambulatory Visit: Payer: Self-pay | Admitting: Hematology and Oncology

## 2016-12-18 ENCOUNTER — Ambulatory Visit (HOSPITAL_BASED_OUTPATIENT_CLINIC_OR_DEPARTMENT_OTHER): Payer: 59

## 2016-12-18 ENCOUNTER — Ambulatory Visit (HOSPITAL_BASED_OUTPATIENT_CLINIC_OR_DEPARTMENT_OTHER): Payer: 59 | Admitting: Hematology and Oncology

## 2016-12-18 ENCOUNTER — Other Ambulatory Visit: Payer: Self-pay

## 2016-12-18 ENCOUNTER — Ambulatory Visit: Payer: Self-pay

## 2016-12-18 ENCOUNTER — Other Ambulatory Visit (HOSPITAL_BASED_OUTPATIENT_CLINIC_OR_DEPARTMENT_OTHER): Payer: 59

## 2016-12-18 ENCOUNTER — Telehealth: Payer: Self-pay | Admitting: Hematology and Oncology

## 2016-12-18 DIAGNOSIS — R1310 Dysphagia, unspecified: Secondary | ICD-10-CM

## 2016-12-18 DIAGNOSIS — N189 Chronic kidney disease, unspecified: Secondary | ICD-10-CM | POA: Diagnosis not present

## 2016-12-18 DIAGNOSIS — C023 Malignant neoplasm of anterior two-thirds of tongue, part unspecified: Secondary | ICD-10-CM

## 2016-12-18 DIAGNOSIS — D631 Anemia in chronic kidney disease: Secondary | ICD-10-CM | POA: Diagnosis not present

## 2016-12-18 DIAGNOSIS — D539 Nutritional anemia, unspecified: Secondary | ICD-10-CM

## 2016-12-18 DIAGNOSIS — D61818 Other pancytopenia: Secondary | ICD-10-CM | POA: Diagnosis not present

## 2016-12-18 LAB — CBC WITH DIFFERENTIAL/PLATELET
BASO%: 0.6 % (ref 0.0–2.0)
BASOS ABS: 0 10*3/uL (ref 0.0–0.1)
EOS%: 3.5 % (ref 0.0–7.0)
Eosinophils Absolute: 0.1 10*3/uL (ref 0.0–0.5)
HCT: 25.4 % — ABNORMAL LOW (ref 34.8–46.6)
HGB: 8.7 g/dL — ABNORMAL LOW (ref 11.6–15.9)
LYMPH#: 0.5 10*3/uL — AB (ref 0.9–3.3)
LYMPH%: 13.1 % — ABNORMAL LOW (ref 14.0–49.7)
MCH: 35.9 pg — AB (ref 25.1–34.0)
MCHC: 34.4 g/dL (ref 31.5–36.0)
MCV: 104.3 fL — AB (ref 79.5–101.0)
MONO#: 0.5 10*3/uL (ref 0.1–0.9)
MONO%: 13.5 % (ref 0.0–14.0)
NEUT%: 69.3 % (ref 38.4–76.8)
NEUTROS ABS: 2.6 10*3/uL (ref 1.5–6.5)
PLATELETS: 218 10*3/uL (ref 145–400)
RBC: 2.43 10*6/uL — AB (ref 3.70–5.45)
RDW: 22.3 % — AB (ref 11.2–14.5)
WBC: 3.8 10*3/uL — ABNORMAL LOW (ref 3.9–10.3)

## 2016-12-18 LAB — COMPREHENSIVE METABOLIC PANEL
ALT: 7 U/L (ref 0–55)
ANION GAP: 8 meq/L (ref 3–11)
AST: 21 U/L (ref 5–34)
Albumin: 3.6 g/dL (ref 3.5–5.0)
Alkaline Phosphatase: 80 U/L (ref 40–150)
BUN: 20.2 mg/dL (ref 7.0–26.0)
CHLORIDE: 104 meq/L (ref 98–109)
CO2: 28 meq/L (ref 22–29)
Calcium: 9.3 mg/dL (ref 8.4–10.4)
Creatinine: 1.3 mg/dL — ABNORMAL HIGH (ref 0.6–1.1)
EGFR: 52 mL/min/{1.73_m2} — AB (ref >60)
GLUCOSE: 106 mg/dL (ref 70–140)
POTASSIUM: 4.1 meq/L (ref 3.5–5.1)
SODIUM: 140 meq/L (ref 136–145)
Total Bilirubin: 0.39 mg/dL (ref 0.20–1.20)
Total Protein: 7.4 g/dL (ref 6.4–8.3)

## 2016-12-18 LAB — MAGNESIUM: Magnesium: 1.8 mg/dl (ref 1.5–2.5)

## 2016-12-18 MED ORDER — DARBEPOETIN ALFA 300 MCG/0.6ML IJ SOSY
300.0000 ug | PREFILLED_SYRINGE | Freq: Once | INTRAMUSCULAR | Status: AC
  Administered 2016-12-18: 300 ug via SUBCUTANEOUS

## 2016-12-18 NOTE — Telephone Encounter (Signed)
Scheduled appt per 12/18 los - Gave patient aVS and calender per los.

## 2016-12-18 NOTE — Patient Instructions (Signed)

## 2016-12-20 ENCOUNTER — Encounter: Payer: Self-pay | Admitting: Hematology and Oncology

## 2016-12-20 NOTE — Assessment & Plan Note (Signed)
She felt better since discontinuation of chemotherapy She will continue to return here every [redacted] weeks along with blood draw and darbepoetin injection for supportive care She will continue aggressive supportive care with IV fluids through advanced home care service

## 2016-12-20 NOTE — Progress Notes (Signed)
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 Tongue, biopsy, Anterior deep margin left SQUAMOUS CELL CARCINOMA 3. Tongue, biopsy, Posterior deep margin left NEGATIVE FOR CARCINOMA 4. Tongue, biopsy, Posterior margin left SQUAMOUS CELL CARCINOMA 5. Tongue, excisional biopsy, New anterior deep margin NEGATIVE FOR CARCINOMA 6. Tongue, excisional biopsy, New posterior deep margin SMALL FOCUS OF SQUAMOUS CELL CARCINOMA (0.1 CM, ONLY PRESENT AT THE DEEPER PERMINENT SECTION) 7. Tongue, resection for tumor, Left lateral INFILTRATIVE KERATINIZED SQUAMOUS CELL CARCINOMA (3.2 CM) THE TUMOR INVADES UNDERNEATH MUSCLE OF THE TONGUE (2.0 CM, PT2) LYMPHOVASCULAR AND PERINEURAL INVASION IDENTIFIED SQUAMOUS CELL CARCINOMA PRESENTED AT (FINAL MARGINS REFER TO PART 2-6) LATERAL INKED MARGIN (1.0 CM) MEDIAL INKED MARGIN (0.6 CM)      02/08/2016 Surgery    SURGICAL PROCEDURES: 1. Left hemiglossectomy with primary closure. 2. Left selective neck dissection (zones I-III).      03/02/2016 Imaging    CT chest with contrast: No evidence of metastatic disease in the chest. 2. Aortic atherosclerosis.       03/02/2016 Imaging    CT neck with contrast showed : Newly enlarged and round 10 mm  right level 1b lymph node (series 5, image 45). The level 1A nodes remain normal. This right 1 B node is nonspecific but suspicious for progressive nodal metastatic disease. It might be amenable to Ultrasound-guided FNA. 2. Interval left hemiglossectomy and selective left neck dissection with confluent indeterminate soft tissue from the anterior left submandibular space through the left carotid space and obscuring the left level 1B, level 2, and level IIIa nodal stations. This study will serve as a new postoperative baseline of this area. Attention directed on close interval follow-up. 3. Mild soft tissue thickening also along the left lateral tongue resection margin. Attention directed on followup. 4. Chest CT today reported separately      03/14/2016 Procedure    She has normal baseline hearing test      03/15/2016 Procedure    1. Successful placement of a right internal jugular approach power injectable Port-A-Cath. The Port a catheter is ready for immediate use. 2. Successful fluoroscopic insertion of a 20-French pull-through gastrostomy tube. The gastrostomy may be used immediately for medication administration and may be utilized in 24 hrs for the initiation of feeds.      03/15/2016 - 04/30/2016 Radiation Therapy    Tongue and Bilateral Neck treated to 68 Gy in 33 fractions      03/16/2016 - 04/27/2016 Chemotherapy    She received weekly cisplatin. Cycle 2-6 reduced dose at 20 mg/m2 due to significant side-effects and poor tolerance       04/04/2016 Adverse Reaction    Chemotherapy is placed on hold due to acute renal failure      04/13/2016 Adverse Reaction    Chemo was placed on hold due to shingles outbreak and paronychia  05/29/2016 - 06/04/2016 Hospital Admission    She was admitted to the hospital for management of nausea, vomiting and inability to tolerate tube feeding      06/06/2016 - 06/13/2016 Hospital Admission    She was admitted to the hospital for management of nausea,  vomiting and inability to tolerate tube feeding      08/09/2016 Imaging    1. New bilateral upper lobe ground-glass nodules. Favor infectious or inflammatory process. Recommend close attention on routine follow-up. 2. Angular nodule RIGHT upper lobe is also favored infectious or inflammatory. Recommend attention on follow-up as above. 3. No mediastinal or supraclavicular lymphadenopathy. 4. Small RIGHT thyroid nodule does not meet biopsy criteria. Please see separate CT neck dictation.      08/09/2016 Imaging    1. Progression of disease with new/markedly worsened mass of the left base of tongue measuring 2.9 x 2.2 x 2.3 centimeters. Persistent soft tissue thickening of the postoperative left neck extending from the angle of the mandible to the level of the hyoid bone. 2. Confluent, centrally necrotic mass in the right submental region, at the site of previously seen enlarged level 1B lymph node. This mass extends into the floor of the mouth and displaces or invades the right sublingual gland and the right mylohyoid and hyoglossus muscles. 3. Centrally necrotic right level IIa lymph node is new from the prior study and consistent with metastatic spread. No other lower cervical lymphadenopathy. 4. Multiple ground-glass opacities in the lung apices. Please see dedicated report for concomitant chest CT for more complete characterization. 5. Thickening of the epiglottis and aryepiglottic folds is likely secondary to prior radiation treatment. No acute airway compromise.      08/22/2016 PET scan    1. The recently described progressive cervical disease is markedly hypermetabolic. This includes local recurrence in the left base of tongue and a confluent right submental nodal mass. In addition, there are small hypermetabolic lymph nodes on the right in station IIb and the anterior to the lower trachea.  2. No typical metastatic disease within the chest, abdomen or pelvis. 3. The recently described  ground-glass opacities in both upper lobes have enlarged, become more dense and are associated with moderate hypermetabolic activity. These are new from February and probably inflammatory/infectious. CT follow-up after appropriate therapy recommended.      08/27/2016 -  Chemotherapy    She received palliative chemo with carboplatin/5FU and cetuximab      09/11/2016 Adverse Reaction    Dose of cetuximab is reduced due to skin toxicity      10/08/2016 PET scan    1. Decrease in metabolic activity of LEFT base of tongue lesion. 2. Decrease in size and metabolic activity of right submental adenopathy/malignancy 3. Reduction in metabolic activity of RIGHT level 2 lymph node. 4. No evidence of metastatic progression chest, abdomen, pelvis. 5. Persistent bilateral upper lobe foci of consolidation. Favored inflammatory or infectious etiology and consider adverse drug reaction.       INTERVAL HISTORY: Please see below for problem oriented charting. She returns for further follow-up with her husband She refused stronger since discontinuation of chemotherapy Her pain is well controlled She denies recent nausea or vomiting She has completed recent evaluation with speech and language therapist and continues to have difficulties with swallowing The patient denies any recent signs or symptoms of bleeding such as spontaneous epistaxis, hematuria or hematochezia.   REVIEW OF SYSTEMS:   Constitutional: Denies fevers, chills or abnormal weight loss Eyes: Denies blurriness of vision  Ears, nose, mouth, throat, and face: Denies mucositis or sore throat Respiratory: Denies cough, dyspnea or wheezes Cardiovascular: Denies palpitation, chest discomfort or lower extremity swelling Gastrointestinal:  Denies nausea, heartburn or change in bowel habits Skin: Denies abnormal skin rashes Lymphatics: Denies new lymphadenopathy or easy bruising Neurological:Denies numbness, tingling or new  weaknesses Behavioral/Psych: Mood is stable, no new changes  All other systems were reviewed with the patient and are negative.  I have reviewed the past medical history, past surgical history, social history and family history with the patient and they are unchanged from previous note.  ALLERGIES:  has No Known Allergies.  MEDICATIONS:  Current Outpatient Medications  Medication Sig Dispense Refill  . Amino Acids-Protein Hydrolys (FEEDING SUPPLEMENT, PRO-STAT SUGAR FREE 64,) LIQD Place 30 mLs into feeding tube daily. 900 mL 0  . chlorhexidine (PERIDEX) 0.12 % solution Rinse with 15 mls three times daily for 30 seconds. Use after breakfast, dinner, and at bedtime. Spit out excess. Do not swallow. 480 mL prn  . clindamycin (CLINDAGEL) 1 % gel Apply topically 2 (two) times daily. 30 g 0  . HYDROmorphone HCl (DILAUDID) 1 MG/ML LIQD Place 6 mLs (6 mg total) into feeding tube every 3 (three) hours as needed for moderate pain or severe pain. 473 mL 0  . levothyroxine (SYNTHROID, LEVOTHROID) 75 MCG tablet Take 1 tablet (75 mcg total) by mouth daily before breakfast. 30 tablet 9  . lidocaine-prilocaine (EMLA) cream APPLY TO AFFECTED AREA ONCE AS DIRECTED 30 g 3  . LORazepam (ATIVAN) 1 MG tablet TAKE 1 TABLET BY MOUTH EVERY 8 HOURS AS NEEDED 60 tablet 0  . mirtazapine (REMERON SOL-TAB) 15 MG disintegrating tablet Take 1 tablet (15 mg total) by mouth at bedtime. 30 tablet 9  . Nutritional Supplements (FEEDING SUPPLEMENT, VITAL 1.5 CAL,) LIQD D/C continuous feeding pump. Give Vital 1.5 or equivalent, 7 bottles daily, bolus feeding via PEG. Flush tube with 60 mL free water before and after bolus feeding QID. In addition, flush tube with 250 mL free water QID. Send formula and syringes. Pick up pump. 7 Can 4  . Nutritional Supplements (PROMOD) LIQD Give 88ml of promod daily via feeding tube (Mix 63ml of promod and 71ml of water).  Flush feeding tube with 82ml of water before giving promod and after. 946 mL 12   . OLANZapine (ZYPREXA) 5 MG tablet Place 1 tablet (5 mg total) into feeding tube at bedtime. (Patient not taking: Reported on 08/10/2016) 30 tablet 9  . ondansetron (ZOFRAN ODT) 8 MG disintegrating tablet Take 1 tablet (8 mg total) by mouth every 8 (eight) hours as needed for nausea or vomiting. (Patient not taking: Reported on 08/10/2016) 30 tablet 5  . ondansetron (ZOFRAN) 8 MG tablet TAKE 1 TABLET BY MOUTH 2 TIMES DAILY AS NEEDED FOR REFRACTORY NAUSEA / VOMITING. START ON DAY 3 AFTER CHEMO. 90 tablet 3  . polyethylene glycol (MIRALAX / GLYCOLAX) packet Place 17 g into feeding tube daily. 14 each 0  . prochlorperazine (COMPAZINE) 10 MG tablet Take 1 tablet (10 mg total) by mouth every 6 (six) hours as needed (Nausea or vomiting). 30 tablet 1  . prochlorperazine (COMPAZINE) 10 MG tablet TAKE 1 TABLET BY MOUTH EVERY 6 HOURS AS NEEDED FOR NAUSEA AND VOMITING 30 tablet 1  . senna (SENOKOT) 8.6 MG tablet Take 1 tablet by mouth 2 (two) times daily.     No current facility-administered medications for this visit.    Facility-Administered Medications Ordered in Other Visits  Medication Dose  Route Frequency Provider Last Rate Last Dose  . heparin lock flush 100 unit/mL  500 Units Intracatheter Once PRN Alvy Bimler, Rion Schnitzer, MD      . sodium chloride flush (NS) 0.9 % injection 10 mL  10 mL Intracatheter PRN Alvy Bimler, Darcella Shiffman, MD        PHYSICAL EXAMINATION: ECOG PERFORMANCE STATUS: 1 - Symptomatic but completely ambulatory  Vitals:   12/18/16 1245  BP: 118/67  Pulse: (!) 109  Resp: 18  Temp: 99.4 F (37.4 C)  SpO2: 98%   Filed Weights   12/18/16 1245  Weight: 177 lb 1.6 oz (80.3 kg)    GENERAL:alert, no distress and comfortable SKIN: skin color, texture, turgor are normal, no rashes or significant lesions EYES: normal, Conjunctiva are pink and non-injected, sclera clear OROPHARYNX: She has difficulties opening her mouth.  Noted tongue resection changes NECK: Significant fibrosis noted  lYMPH:  no  palpable lymphadenopathy in the cervical, axillary or inguinal LUNGS: clear to auscultation and percussion with normal breathing effort HEART: regular rate & rhythm and no murmurs and no lower extremity edema ABDOMEN:abdomen soft, non-tender and normal bowel sounds Musculoskeletal:no cyanosis of digits and no clubbing  NEURO: alert & oriented x 3 with fluent speech, no focal motor/sensory deficits  LABORATORY DATA:  I have reviewed the data as listed    Component Value Date/Time   NA 140 12/18/2016 1222   K 4.1 12/18/2016 1222   CL 101 06/12/2016 0540   CO2 28 12/18/2016 1222   GLUCOSE 106 12/18/2016 1222   BUN 20.2 12/18/2016 1222   CREATININE 1.3 (H) 12/18/2016 1222   CALCIUM 9.3 12/18/2016 1222   PROT 7.4 12/18/2016 1222   ALBUMIN 3.6 12/18/2016 1222   AST 21 12/18/2016 1222   ALT 7 12/18/2016 1222   ALKPHOS 80 12/18/2016 1222   BILITOT 0.39 12/18/2016 1222   GFRNONAA 56 (L) 06/12/2016 0540   GFRAA >60 06/12/2016 0540    No results found for: SPEP, UPEP  Lab Results  Component Value Date   WBC 3.8 (L) 12/18/2016   NEUTROABS 2.6 12/18/2016   HGB 8.7 (L) 12/18/2016   HCT 25.4 (L) 12/18/2016   MCV 104.3 (H) 12/18/2016   PLT 218 12/18/2016      Chemistry      Component Value Date/Time   NA 140 12/18/2016 1222   K 4.1 12/18/2016 1222   CL 101 06/12/2016 0540   CO2 28 12/18/2016 1222   BUN 20.2 12/18/2016 1222   CREATININE 1.3 (H) 12/18/2016 1222      Component Value Date/Time   CALCIUM 9.3 12/18/2016 1222   ALKPHOS 80 12/18/2016 1222   AST 21 12/18/2016 1222   ALT 7 12/18/2016 1222   BILITOT 0.39 12/18/2016 1222       ASSESSMENT & PLAN:  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[place]) She felt better since discontinuation of chemotherapy She will continue to return here every [redacted] weeks along with blood draw and darbepoetin injection for supportive care She will continue aggressive supportive care with IV fluids through advanced home care  service  Pancytopenia, acquired [facility]) She has acquired, severe pancytopenia due to recent chemotherapy Due to her religious belief, the patient has declined blood transfusion She will continue darbepoetin injection every 2 weeks to keep hemoglobin greater than 10  Dysphagia She has chronic dysphagia since radiation treatment She does not perform any regular swallowing exercises and has declined eating or drinking liquids for fear that she may choke I have reviewed recent evaluation by  speech and language therapist who diagnosed her with severe fibrosis Per recommendation, I will order modified barium swallow   No orders of the defined types were placed in this encounter.  All questions were answered. The patient knows to call the clinic with any problems, questions or concerns. No barriers to learning was detected. I spent 15 minutes counseling the patient face to face. The total time spent in the appointment was 20 minutes and more than 50% was on counseling and review of test results     [name], MD 12/20/2016 6:53 AM

## 2016-12-20 NOTE — Assessment & Plan Note (Signed)
She has chronic dysphagia since radiation treatment She does not perform any regular swallowing exercises and has declined eating or drinking liquids for fear that she may choke I have reviewed recent evaluation by speech and language therapist who diagnosed her with severe fibrosis Per recommendation, I will order modified barium swallow

## 2016-12-20 NOTE — Assessment & Plan Note (Signed)
She has acquired, severe pancytopenia due to recent chemotherapy Due to her religious belief, the patient has declined blood transfusion She will continue darbepoetin injection every 2 weeks to keep hemoglobin greater than 10

## 2016-12-21 ENCOUNTER — Other Ambulatory Visit: Payer: Self-pay | Admitting: Hematology and Oncology

## 2016-12-27 ENCOUNTER — Other Ambulatory Visit (HOSPITAL_COMMUNITY): Payer: Self-pay | Admitting: Hematology and Oncology

## 2016-12-27 DIAGNOSIS — R1310 Dysphagia, unspecified: Secondary | ICD-10-CM

## 2016-12-29 ENCOUNTER — Other Ambulatory Visit: Payer: Self-pay | Admitting: Hematology and Oncology

## 2017-01-02 ENCOUNTER — Ambulatory Visit: Payer: 59

## 2017-01-02 ENCOUNTER — Other Ambulatory Visit: Payer: Self-pay | Admitting: *Deleted

## 2017-01-02 ENCOUNTER — Other Ambulatory Visit (HOSPITAL_BASED_OUTPATIENT_CLINIC_OR_DEPARTMENT_OTHER): Payer: 59

## 2017-01-02 DIAGNOSIS — D539 Nutritional anemia, unspecified: Secondary | ICD-10-CM

## 2017-01-02 DIAGNOSIS — C023 Malignant neoplasm of anterior two-thirds of tongue, part unspecified: Secondary | ICD-10-CM | POA: Diagnosis not present

## 2017-01-02 DIAGNOSIS — F331 Major depressive disorder, recurrent, moderate: Secondary | ICD-10-CM

## 2017-01-02 LAB — CBC WITH DIFFERENTIAL/PLATELET
BASO%: 0.5 % (ref 0.0–2.0)
BASOS ABS: 0 10*3/uL (ref 0.0–0.1)
EOS ABS: 0.1 10*3/uL (ref 0.0–0.5)
EOS%: 1.5 % (ref 0.0–7.0)
HEMATOCRIT: 33 % — AB (ref 34.8–46.6)
HEMOGLOBIN: 11.2 g/dL — AB (ref 11.6–15.9)
LYMPH#: 0.6 10*3/uL — AB (ref 0.9–3.3)
LYMPH%: 8.8 % — ABNORMAL LOW (ref 14.0–49.7)
MCH: 33.6 pg (ref 25.1–34.0)
MCHC: 34 g/dL (ref 31.5–36.0)
MCV: 98.8 fL (ref 79.5–101.0)
MONO#: 0.6 10*3/uL (ref 0.1–0.9)
MONO%: 8.8 % (ref 0.0–14.0)
NEUT#: 5.9 10*3/uL (ref 1.5–6.5)
NEUT%: 80.4 % — ABNORMAL HIGH (ref 38.4–76.8)
Platelets: 275 10*3/uL (ref 145–400)
RBC: 3.34 10*6/uL — ABNORMAL LOW (ref 3.70–5.45)
RDW: 21 % — AB (ref 11.2–14.5)
WBC: 7.3 10*3/uL (ref 3.9–10.3)

## 2017-01-02 LAB — COMPREHENSIVE METABOLIC PANEL
ALBUMIN: 3.9 g/dL (ref 3.5–5.0)
ALK PHOS: 91 U/L (ref 40–150)
ALT: 21 U/L (ref 0–55)
ANION GAP: 8 meq/L (ref 3–11)
AST: 35 U/L — ABNORMAL HIGH (ref 5–34)
BUN: 17.4 mg/dL (ref 7.0–26.0)
CALCIUM: 9.6 mg/dL (ref 8.4–10.4)
CHLORIDE: 106 meq/L (ref 98–109)
CO2: 27 mEq/L (ref 22–29)
Creatinine: 1.1 mg/dL (ref 0.6–1.1)
Glucose: 106 mg/dl (ref 70–140)
POTASSIUM: 3.7 meq/L (ref 3.5–5.1)
Sodium: 141 mEq/L (ref 136–145)
Total Bilirubin: 0.33 mg/dL (ref 0.20–1.20)
Total Protein: 8.4 g/dL — ABNORMAL HIGH (ref 6.4–8.3)

## 2017-01-02 LAB — MAGNESIUM: MAGNESIUM: 2 mg/dL (ref 1.5–2.5)

## 2017-01-02 MED ORDER — HYDROMORPHONE HCL 1 MG/ML PO LIQD: 6.0000 mg | ORAL | 0 refills | Status: DC | PRN

## 2017-01-02 MED ORDER — OLANZAPINE 5 MG PO TABS: 5.0000 mg | ORAL_TABLET | Freq: Every day | ORAL | 9 refills | Status: DC

## 2017-01-02 MED ORDER — PROCHLORPERAZINE MALEATE 10 MG PO TABS: ORAL_TABLET | ORAL | 1 refills | Status: AC

## 2017-01-02 MED FILL — PROCHLORPERAZINE 10 MG TAB: 10 | 7 days supply | Qty: 30 | Fill #0

## 2017-01-02 MED FILL — HYDROMORPHONE 5 MG/5 ML SOL: 1 | 26 days supply | Qty: 473 | Fill #0

## 2017-01-02 MED FILL — OLANZapine 5 MG TABS: 5 | 30 days supply | Qty: 30 | Fill #0

## 2017-01-02 NOTE — Progress Notes (Signed)
Hemoglobin noted at 11.2 today. No injection needed at this time. Current copy of labs and schedule given to patient and family member. Instructed o follow schedule and call our office should issues occur.

## 2017-01-03 ENCOUNTER — Ambulatory Visit (HOSPITAL_COMMUNITY)
Admission: RE | Admit: 2017-01-03 | Discharge: 2017-01-03 | Disposition: A | Discharge status: Home / Self Care | Payer: 59 | Source: Ambulatory Visit | Attending: Medical | Admitting: Medical

## 2017-01-03 ENCOUNTER — Ambulatory Visit (HOSPITAL_BASED_OUTPATIENT_CLINIC_OR_DEPARTMENT_OTHER): Payer: 59 | Admitting: Medical

## 2017-01-03 ENCOUNTER — Telehealth: Payer: Self-pay

## 2017-01-03 VITALS — BP 142/79 | HR 119 | Temp 101.1°F | Resp 14 | Ht 68.0 in | Wt 174.1 lb

## 2017-01-03 DIAGNOSIS — J181 Lobar pneumonia, unspecified organism: Secondary | ICD-10-CM | POA: Diagnosis not present

## 2017-01-03 DIAGNOSIS — J189 Pneumonia, unspecified organism: Secondary | ICD-10-CM

## 2017-01-03 DIAGNOSIS — R509 Fever, unspecified: Secondary | ICD-10-CM | POA: Diagnosis present

## 2017-01-03 DIAGNOSIS — C023 Malignant neoplasm of anterior two-thirds of tongue, part unspecified: Secondary | ICD-10-CM

## 2017-01-03 DIAGNOSIS — R059 Cough, unspecified: Secondary | ICD-10-CM

## 2017-01-03 DIAGNOSIS — R110 Nausea: Secondary | ICD-10-CM | POA: Diagnosis not present

## 2017-01-03 DIAGNOSIS — R918 Other nonspecific abnormal finding of lung field: Secondary | ICD-10-CM | POA: Insufficient documentation

## 2017-01-03 DIAGNOSIS — R05 Cough: Secondary | ICD-10-CM

## 2017-01-03 MED ORDER — AMOXICILLIN-POT CLAVULANATE 400-57 MG/5ML PO SUSR: 10.0000 mL | Freq: Two times a day (BID) | ORAL | 0 refills | Status: AC

## 2017-01-03 MED ORDER — HYDROCOD POLST-CPM POLST ER 10-8 MG/5ML PO SUER: 5.0000 mL | Freq: Two times a day (BID) | ORAL | 0 refills | Status: DC | PRN

## 2017-01-03 MED ORDER — ONDANSETRON HCL 8 MG PO TABS: 8.0000 mg | ORAL_TABLET | Freq: Three times a day (TID) | ORAL | 2 refills | Status: AC | PRN

## 2017-01-03 NOTE — Telephone Encounter (Signed)
Per home care nurse, pt with temp of 100, pro cough and crackles.   Per Dr Alvy Bimler, schedule pt to be seen by Sandi Mealy, PA. Pt aggreeable to 2:15 appt.

## 2017-01-04 NOTE — Progress Notes (Signed)
[facility] Progress Note   Tiffany Yu [id number] [birth year] 62 y.o.  Tiffany Yu is managed by Tiffany Yu  Actively treated with chemotherapy: no   Assessment: Plan:    Pneumonia of right lower lobe due to infectious organism ([place]) - Plan: amoxicillin-clavulanate (AUGMENTIN) 400-57 MG/5ML suspension  Cough - Plan: chlorpheniramine-HYDROcodone (TUSSIONEX) 10-8 MG/5ML SUER  Nausea without vomiting - Plan: ondansetron (ZOFRAN) 8 MG tablet   Pneumonia of the right lower lobe: Chest x-ray returned showing a suspected pneumonia in the right lower lobe.  The patient was given a prescription for Augmentin suspension for 7 days.  Cough: The patient was given a prescription for Tussionex as needed for her cough.  Nausea with vomiting: Patient was given a prescription for Zofran 8 mg.  Please see After Visit Summary for patient specific instructions.  Future Appointments  Date Time Provider [place]  01/09/2017 11:00 AM [facility] SPEECH THERAPIST [facility] None  01/09/2017 11:00 AM [facility]/F 2 [facility]  01/17/2017 12:30 PM [facility] ONLY [facility] None  01/17/2017  1:00 PM Tiffany Yu [facility] None  01/21/2017  1:15 PM Tiffany Yu, Tiffany Yu, CCC-SLP [facility]    No orders of the defined types were placed in this encounter.      Subjective:   Patient ID:  Tiffany Yu is a 62 y.o. (DOB April 10, 1955) female.  Chief Complaint:  Chief Complaint  Patient presents with  . 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  Patient CT scan did not show evidence of metastasis.  She was treated with weekly cis-platinum and radiation therapy with radiation therapy given from 03/15/2016 through 04/30/2016.  Cycles 2 through 6 of cisplatin were initially planned to be given at a reduced dose of 20 mg/m  due to significant side effects and poor tolerance.  On 04/04/2016 her cis-platinum was held due to acute renal failure.  Her chemotherapy was again held on 04/13/2016 due to a shingles outbreak and a paronychia.  A PET scan was completed on 08/22/2016 which showed progressive cervical disease which was markedly hypermetabolic.  Noted was a local recurrence in the left base of the tongue and a confluent right side of mental nodal mass.  In addition there was a small hypermetabolic lymph node on the right and station to be and the anterior to the lower trachea.  No metastatic disease was noted within the chest abdomen or pelvis.  She was begun on palliative chemotherapy with carboplatinum, 5-FU and cetuximab on 08/27/2016 with cetuximab reduced due to skin toxicity on 09/11/2016.  A PET scan completed on 10/08/2016 showed decrease in metabolic activity in the left base of the tongue lesion, decrease in size and metabolic activity in the right submandibular adenopathy and malignancy, reduction in metabolic activity in the right level 2 lymph node, and no evidence of metastatic progression in the chest abdomen or pelvis.  She presents to the office today with a report of a cough with greenish yellow sputum.  Her pulse today at intake was 119 with a temperature of 101.1.  She has been having some nausea.  A chest x-ray completed today shows a new opacity in the right lung base which could represent a pneumonia.  She is receiving IV fluids at home.  She continues to get most of her nutrition through a  PEG tube.  She is able to drink water and to eat applesauce.  Medications: I have reviewed the patient's current medications.  Allergies: No Known Allergies  Past Medical History:  Diagnosis Date  . Anxiety   . Arthritis   . Depression    takes ZYban daily  . History of colon polyps    benign  . History of radiation therapy 03/15/16- 04/30/16   Tongue and bilateral neck 68 Gy in 33 fractions  . Hyperlipidemia     takes Atorvastatin daily  . Hypertension    takes HCTZ daily  . Hypomagnesemia 05/04/2016  . Hypothyroidism    takes Synthroid daily  . Insomnia    takes Ambien nightly and Xanax as needed  . Refusal of blood transfusions as patient is Jehovah's Witness   . squamous cell of the tongue dx'd 12/2015    Past Surgical History:  Procedure Laterality Date  . CHOLECYSTECTOMY    . COLONOSCOPY    . DILATION AND CURETTAGE OF UTERUS    . EXCISION OF TONGUE LESION Left 02/08/2016   Procedure: WIDE LOCAL EXCISION OF LEFT LATERAL TONGUE;  Surgeon: Tiffany Belfast, Yu;  Location: [place];  Service: ENT;  Laterality: Left;  . FEMUR IM NAIL Right 02/28/2012   Procedure: OPEN REDUCTION INTERNAL FIXATION (ORIF) RIGHT KNEE;  Surgeon: Tiffany Corning, Yu;  Location: [place];  Service: Orthopedics;  Laterality: Right;  . IR GENERIC HISTORICAL  03/15/2016   IR FLUORO GUIDE PORT INSERTION RIGHT 03/15/2016 Tiffany Mariscal, Yu [facility]  . IR GENERIC HISTORICAL  03/15/2016   IR US GUIDE VASC ACCESS RIGHT 03/15/2016 Tiffany Mariscal, Yu [facility]  . IR GENERIC HISTORICAL  03/15/2016   IR GASTROSTOMY TUBE MOD SED 03/15/2016 [facility]  . KNEE ARTHROPLASTY  12/14/2011   Procedure: COMPUTER ASSISTED TOTAL KNEE ARTHROPLASTY;  Surgeon: Tiffany Corning, Yu;  Location: [place];  Service: Orthopedics;  Laterality: Right;  . KNEE ARTHROSCOPY     Right  . PLANTAR FASCIA SURGERY     right foot  . RADICAL NECK DISSECTION Left 02/08/2016   Procedure: LEFT SELECTIVE RADICAL NECK DISSECTION;  Surgeon: Tiffany Belfast, Yu;  Location: [place];  Service: ENT;  Laterality: Left;  . TENDON RELEASE     right wrist  . TOTAL KNEE ARTHROPLASTY  12/14/2011   right knee    Family History  Problem Relation Age of Onset  . Anemia Mother   . Hypertension Mother   . Heart attack Father     Social History   Socioeconomic History  . Marital status: Married    Spouse name: Tiffany Yu  . Number of children: 2  . Years of education: College  .  Highest education level: Not on file  Social Needs  . Financial resource strain: Not on file  . Food insecurity - worry: Not on file  . Food insecurity - inability: Not on file  . Transportation needs - medical: Not on file  . Transportation needs - non-medical: Not on file  Occupational History  . Occupation: Publishing copy     Employer: [employer]  Tobacco Use  . Smoking status: Never Smoker  . Smokeless tobacco: Never Used  Substance and Sexual Activity  . Alcohol use: Yes    Comment: very rarely  . Drug use: No  . Sexual activity: Not on file  Other Topics Concern  . Not on file  Social History Narrative   Patient is married Tiffany Yu) and lives at home with  her husband.   Patient has a college education.   Patient drinks two sodas per day.   Patient has two children.   Patient is right-handed.          Past Medical History, Surgical history, Social history, and Family history were reviewed and updated as appropriate.   Please see review of systems for further details on the patient's review from today.   Review of Systems:  Review of Systems  Constitutional: Positive for fever. Negative for appetite change, chills, diaphoresis and fatigue.  HENT: Positive for trouble swallowing.   Respiratory: Positive for cough. Negative for choking, chest tightness, shortness of breath and wheezing.   Cardiovascular: Negative for chest pain and leg swelling.  Gastrointestinal: Positive for nausea. Negative for constipation, diarrhea and vomiting.    Objective:   Physical Exam:  BP (!) 142/79 (BP Location: Right Arm, Patient Position: Sitting)   Pulse (!) 119 Comment: RN Beth aware of pulse.  Temp (!) 101.1 F (38.4 C) (Oral) Comment: RN Beth aware of temp.  Resp 14   Ht 5\' 8"  (1.727 m)   Wt 174 lb 1.6 oz (79 kg)   SpO2 96%   BMI 26.47 kg/m  ECOG: 1  Physical Exam  Constitutional: No distress.  HENT:  Head: Normocephalic.    Right Ear: External ear normal.    Left Ear: External ear normal.  Mouth/Throat: Oropharynx is clear and moist.    Cardiovascular: S1 normal and S2 normal. Tachycardia present.  Pulmonary/Chest: Effort normal and breath sounds normal. No respiratory distress. She has no wheezes. She has no rales.  Abdominal: Soft. Bowel sounds are normal. She exhibits no distension. There is no tenderness. There is no rebound and no guarding.    Neurological: She is alert.  Skin: Skin is warm and dry. No rash noted. She is not diaphoretic. No erythema.    Lab Review:     Component Value Date/Time   NA 141 01/02/2017 1214   K 3.7 01/02/2017 1214   CL 101 06/12/2016 0540   CO2 27 01/02/2017 1214   GLUCOSE 106 01/02/2017 1214   BUN 17.4 01/02/2017 1214   CREATININE 1.1 01/02/2017 1214   CALCIUM 9.6 01/02/2017 1214   PROT 8.4 (H) 01/02/2017 1214   ALBUMIN 3.9 01/02/2017 1214   AST 35 (H) 01/02/2017 1214   ALT 21 01/02/2017 1214   ALKPHOS 91 01/02/2017 1214   BILITOT 0.33 01/02/2017 1214   GFRNONAA 56 (L) 06/12/2016 0540   GFRAA >60 06/12/2016 0540       Component Value Date/Time   WBC 7.3 01/02/2017 1214   WBC 5.1 06/12/2016 0540   RBC 3.34 (L) 01/02/2017 1214   RBC 3.06 (L) 06/12/2016 0540   HGB 11.2 (L) 01/02/2017 1214   HCT 33.0 (L) 01/02/2017 1214   PLT 275 01/02/2017 1214   MCV 98.8 01/02/2017 1214   MCH 33.6 01/02/2017 1214   MCH 30.4 06/12/2016 0540   MCHC 34.0 01/02/2017 1214   MCHC 36.3 (H) 06/12/2016 0540   RDW 21.0 (H) 01/02/2017 1214   LYMPHSABS 0.6 (L) 01/02/2017 1214   MONOABS 0.6 01/02/2017 1214   EOSABS 0.1 01/02/2017 1214   BASOSABS 0.0 01/02/2017 1214   -------------------------------  Imaging from last 24 hours (if applicable):  Radiology interpretation: Dg Chest 2 View  Result Date: 01/03/2017 CLINICAL DATA:  Tongue cancer. Productive cough, shortness of breath, and fever since yesterday. Central chest pain. Nonsmoker. EXAM: CHEST  2 VIEW COMPARISON:  None. FINDINGS: Bilateral streaky  apical lung opacities are present. These are better seen on prior PET-CT from 10/08/2016. No definite change. Elevation of the right hemidiaphragm with infiltration or atelectasis in the right lung base. This could represent pneumonia. This is new since previous studies. Heart size and pulmonary vascularity are normal. No pleural effusions. No pneumothorax. Mediastinal contours appear intact. Power port type right central venous catheter with tip over the low SVC region. IMPRESSION: New opacity in the right lung base may represent pneumonia, new since previous studies. Persistent linear opacities in the apical regions bilaterally, similar to previous studies. Electronically Signed   By: Lucienne Capers M.D.   On: 01/03/2017 20:47

## 2017-01-07 ENCOUNTER — Telehealth: Payer: Self-pay | Admitting: *Deleted

## 2017-01-07 NOTE — Telephone Encounter (Signed)
Called patient to follow up. States she got new antibiotics on Sunday from the on call MD. Temp has ben 99.0-99.7. States she is feeling a little better.

## 2017-01-09 ENCOUNTER — Other Ambulatory Visit (HOSPITAL_COMMUNITY): Payer: Self-pay

## 2017-01-09 ENCOUNTER — Ambulatory Visit (HOSPITAL_COMMUNITY): Payer: 59

## 2017-01-11 ENCOUNTER — Other Ambulatory Visit (HOSPITAL_COMMUNITY): Payer: Self-pay | Admitting: Hematology and Oncology

## 2017-01-11 DIAGNOSIS — R1310 Dysphagia, unspecified: Secondary | ICD-10-CM

## 2017-01-14 ENCOUNTER — Telehealth: Payer: Self-pay | Admitting: Hematology and Oncology

## 2017-01-14 NOTE — Telephone Encounter (Signed)
Faxed records to [place] [phone]

## 2017-01-17 ENCOUNTER — Inpatient Hospital Stay: Payer: 59

## 2017-01-17 ENCOUNTER — Ambulatory Visit: Payer: Self-pay

## 2017-01-17 ENCOUNTER — Encounter: Payer: Self-pay | Admitting: Hematology and Oncology

## 2017-01-17 ENCOUNTER — Inpatient Hospital Stay: Payer: 59 | Attending: Hematology and Oncology | Admitting: Hematology and Oncology

## 2017-01-17 ENCOUNTER — Telehealth: Payer: Self-pay | Admitting: Hematology and Oncology

## 2017-01-17 VITALS — BP 100/72 | HR 114 | Temp 98.5°F | Resp 18 | Ht 68.0 in | Wt 173.2 lb

## 2017-01-17 DIAGNOSIS — R059 Cough, unspecified: Secondary | ICD-10-CM

## 2017-01-17 DIAGNOSIS — R05 Cough: Secondary | ICD-10-CM | POA: Insufficient documentation

## 2017-01-17 DIAGNOSIS — I890 Lymphedema, not elsewhere classified: Secondary | ICD-10-CM | POA: Diagnosis not present

## 2017-01-17 DIAGNOSIS — R062 Wheezing: Secondary | ICD-10-CM | POA: Diagnosis not present

## 2017-01-17 DIAGNOSIS — J449 Chronic obstructive pulmonary disease, unspecified: Secondary | ICD-10-CM

## 2017-01-17 DIAGNOSIS — R54 Age-related physical debility: Secondary | ICD-10-CM | POA: Diagnosis not present

## 2017-01-17 DIAGNOSIS — J189 Pneumonia, unspecified organism: Secondary | ICD-10-CM

## 2017-01-17 DIAGNOSIS — C023 Malignant neoplasm of anterior two-thirds of tongue, part unspecified: Secondary | ICD-10-CM

## 2017-01-17 DIAGNOSIS — D539 Nutritional anemia, unspecified: Secondary | ICD-10-CM

## 2017-01-17 LAB — COMPREHENSIVE METABOLIC PANEL
ALBUMIN: 3.2 g/dL — AB (ref 3.5–5.0)
ALK PHOS: 81 U/L (ref 40–150)
ALT: 11 U/L (ref 0–55)
AST: 21 U/L (ref 5–34)
Anion gap: 8 (ref 3–11)
BILIRUBIN TOTAL: 0.5 mg/dL (ref 0.2–1.2)
BUN: 20 mg/dL (ref 7–26)
CALCIUM: 9.7 mg/dL (ref 8.4–10.4)
CO2: 32 mmol/L — ABNORMAL HIGH (ref 22–29)
Chloride: 99 mmol/L (ref 98–109)
Creatinine, Ser: 1.11 mg/dL — ABNORMAL HIGH (ref 0.60–1.10)
GFR calc Af Amer: 60 mL/min — ABNORMAL LOW (ref >60)
GFR calc non Af Amer: 52 mL/min — ABNORMAL LOW (ref >60)
GLUCOSE: 100 mg/dL (ref 70–140)
POTASSIUM: 4.3 mmol/L (ref 3.3–4.7)
Sodium: 139 mmol/L (ref 136–145)
TOTAL PROTEIN: 7.6 g/dL (ref 6.4–8.3)

## 2017-01-17 LAB — CBC WITH DIFFERENTIAL/PLATELET
BASOS ABS: 0 10*3/uL (ref 0.0–0.1)
BASOS PCT: 0 %
Eosinophils Absolute: 0.4 10*3/uL (ref 0.0–0.5)
Eosinophils Relative: 4 %
HEMATOCRIT: 29.9 % — AB (ref 34.8–46.6)
HEMOGLOBIN: 10 g/dL — AB (ref 11.6–15.9)
LYMPHS PCT: 6 %
Lymphs Abs: 0.5 10*3/uL — ABNORMAL LOW (ref 0.9–3.3)
MCH: 31.3 pg (ref 25.1–34.0)
MCHC: 33.5 g/dL (ref 31.5–36.0)
MCV: 93.4 fL (ref 79.5–101.0)
Monocytes Absolute: 0.5 10*3/uL (ref 0.1–0.9)
Monocytes Relative: 6 %
NEUTROS ABS: 7.6 10*3/uL — AB (ref 1.5–6.5)
NEUTROS PCT: 84 %
Platelets: 202 10*3/uL (ref 145–400)
RBC: 3.2 MIL/uL — AB (ref 3.70–5.45)
RDW: 22.9 % — ABNORMAL HIGH (ref 11.2–16.1)
WBC: 9.1 10*3/uL (ref 3.9–10.3)

## 2017-01-17 LAB — MAGNESIUM: Magnesium: 1.9 mg/dL (ref 1.5–2.5)

## 2017-01-17 MED ORDER — HYDROCOD POLST-CPM POLST ER 10-8 MG/5ML PO SUER: 5.0000 mL | Freq: Two times a day (BID) | ORAL | 0 refills | Status: AC | PRN

## 2017-01-17 MED ORDER — HYDROMORPHONE HCL 1 MG/ML PO LIQD: 6.0000 mg | ORAL | 0 refills | Status: DC | PRN

## 2017-01-17 MED ORDER — PREDNISONE 50 MG PO TABS: 50.0000 mg | ORAL_TABLET | Freq: Every day | ORAL | 0 refills | Status: DC

## 2017-01-17 NOTE — Telephone Encounter (Signed)
Gave patient AVs and calendar of upcoming January appointments.  °

## 2017-01-18 ENCOUNTER — Encounter: Payer: Self-pay | Admitting: Hematology and Oncology

## 2017-01-18 DIAGNOSIS — J189 Pneumonia, unspecified organism: Secondary | ICD-10-CM | POA: Insufficient documentation

## 2017-01-18 NOTE — Progress Notes (Signed)
Received PA determination from [facility] for Hydromorphone HCL liquid.  Per fax PA not required. Attempted to call [facility] but was left on hold for a long time. They can call the number to [facility] at [phone] and ask for the pharmacy help desk.

## 2017-01-18 NOTE — Progress Notes (Signed)
Received PA request for Hydromorphone HCL.  Submitted via Cover My Meds:  Tiffany Yu (Key: [id number])  Need help? Call us at [phone]   Outcome  N/A  Next Steps  The plan will fax you a determination, typically within 1 to 5 business days.

## 2017-01-18 NOTE — Assessment & Plan Note (Addendum)
She has chronic cough and was started on antibiotics but  she did not complete the course of treatment The patient has been coughing nonstop The cough suppressive medication seems to help but I am concerned due to her immunocompromise state I am also concerned that she may have a component of aspiration pneumonia I recommend she complete the course of antibiotic as prescribed I have refilled the prescription cough medicine for her I plan to repeat imaging study in 2 weeks and then will reassess her lungs at that time. I also recommend the addition of prednisone due to component of wheezes and COPD exacerbation.

## 2017-01-18 NOTE — Assessment & Plan Note (Signed)
She has significant lymphedema in her neck The base of her tongue/inferior portion of the chin feels more thickened than before I am concerned about possible cancer recurrence I will order a PET CT scan to reassess

## 2017-01-18 NOTE — Assessment & Plan Note (Signed)
She is very frail even since discontinuation of chemotherapy The tumor infiltration at the base of her tongue appears to be slightly worse on exam Plan to repeat PET/CT scan in 2 weeks to reassess In the meantime, we will continue aggressive supportive care

## 2017-01-18 NOTE — Progress Notes (Signed)
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 Tongue, biopsy, Anterior deep margin left SQUAMOUS CELL CARCINOMA 3. Tongue, biopsy, Posterior deep margin left NEGATIVE FOR CARCINOMA 4. Tongue, biopsy, Posterior margin left SQUAMOUS CELL CARCINOMA 5. Tongue, excisional biopsy, New anterior deep margin NEGATIVE FOR CARCINOMA 6. Tongue, excisional biopsy, New posterior deep margin SMALL FOCUS OF SQUAMOUS CELL CARCINOMA (0.1 CM, ONLY PRESENT AT THE DEEPER PERMINENT SECTION) 7. Tongue, resection for tumor, Left lateral INFILTRATIVE KERATINIZED SQUAMOUS CELL CARCINOMA (3.2 CM) THE TUMOR INVADES UNDERNEATH MUSCLE OF THE TONGUE (2.0 CM, PT2) LYMPHOVASCULAR AND PERINEURAL INVASION IDENTIFIED SQUAMOUS CELL CARCINOMA PRESENTED AT (FINAL MARGINS REFER TO PART 2-6) LATERAL INKED MARGIN (1.0 CM) MEDIAL INKED MARGIN (0.6 CM)      02/08/2016 Surgery    SURGICAL PROCEDURES: 1. Left hemiglossectomy with primary closure. 2. Left selective neck dissection (zones I-III).      03/02/2016 Imaging    CT chest with contrast: No evidence of metastatic disease in the chest. 2. Aortic atherosclerosis.       03/02/2016 Imaging    CT neck with contrast showed : Newly enlarged and round 10 mm  right level 1b lymph node (series 5, image 45). The level 1A nodes remain normal. This right 1 B node is nonspecific but suspicious for progressive nodal metastatic disease. It might be amenable to Ultrasound-guided FNA. 2. Interval left hemiglossectomy and selective left neck dissection with confluent indeterminate soft tissue from the anterior left submandibular space through the left carotid space and obscuring the left level 1B, level 2, and level IIIa nodal stations. This study will serve as a new postoperative baseline of this area. Attention directed on close interval follow-up. 3. Mild soft tissue thickening also along the left lateral tongue resection margin. Attention directed on followup. 4. Chest CT today reported separately      03/14/2016 Procedure    She has normal baseline hearing test      03/15/2016 Procedure    1. Successful placement of a right internal jugular approach power injectable Port-A-Cath. The Port a catheter is ready for immediate use. 2. Successful fluoroscopic insertion of a 20-French pull-through gastrostomy tube. The gastrostomy may be used immediately for medication administration and may be utilized in 24 hrs for the initiation of feeds.      03/15/2016 - 04/30/2016 Radiation Therapy    Tongue and Bilateral Neck treated to 68 Gy in 33 fractions      03/16/2016 - 04/27/2016 Chemotherapy    She received weekly cisplatin. Cycle 2-6 reduced dose at 20 mg/m2 due to significant side-effects and poor tolerance       04/04/2016 Adverse Reaction    Chemotherapy is placed on hold due to acute renal failure      04/13/2016 Adverse Reaction    Chemo was placed on hold due to shingles outbreak and paronychia  05/29/2016 - 06/04/2016 Hospital Admission    She was admitted to the hospital for management of nausea, vomiting and inability to tolerate tube feeding      06/06/2016 - 06/13/2016 Hospital Admission    She was admitted to the hospital for management of nausea,  vomiting and inability to tolerate tube feeding      08/09/2016 Imaging    1. New bilateral upper lobe ground-glass nodules. Favor infectious or inflammatory process. Recommend close attention on routine follow-up. 2. Angular nodule RIGHT upper lobe is also favored infectious or inflammatory. Recommend attention on follow-up as above. 3. No mediastinal or supraclavicular lymphadenopathy. 4. Small RIGHT thyroid nodule does not meet biopsy criteria. Please see separate CT neck dictation.      08/09/2016 Imaging    1. Progression of disease with new/markedly worsened mass of the left base of tongue measuring 2.9 x 2.2 x 2.3 centimeters. Persistent soft tissue thickening of the postoperative left neck extending from the angle of the mandible to the level of the hyoid bone. 2. Confluent, centrally necrotic mass in the right submental region, at the site of previously seen enlarged level 1B lymph node. This mass extends into the floor of the mouth and displaces or invades the right sublingual gland and the right mylohyoid and hyoglossus muscles. 3. Centrally necrotic right level IIa lymph node is new from the prior study and consistent with metastatic spread. No other lower cervical lymphadenopathy. 4. Multiple ground-glass opacities in the lung apices. Please see dedicated report for concomitant chest CT for more complete characterization. 5. Thickening of the epiglottis and aryepiglottic folds is likely secondary to prior radiation treatment. No acute airway compromise.      08/22/2016 PET scan    1. The recently described progressive cervical disease is markedly hypermetabolic. This includes local recurrence in the left base of tongue and a confluent right submental nodal mass. In addition, there are small hypermetabolic lymph nodes on the right in station IIb and the anterior to the lower trachea.  2. No typical metastatic disease within the chest, abdomen or pelvis. 3. The recently described  ground-glass opacities in both upper lobes have enlarged, become more dense and are associated with moderate hypermetabolic activity. These are new from February and probably inflammatory/infectious. CT follow-up after appropriate therapy recommended.      08/27/2016 -  Chemotherapy    She received palliative chemo with carboplatin/5FU and cetuximab      09/11/2016 Adverse Reaction    Dose of cetuximab is reduced due to skin toxicity      10/08/2016 PET scan    1. Decrease in metabolic activity of LEFT base of tongue lesion. 2. Decrease in size and metabolic activity of right submental adenopathy/malignancy 3. Reduction in metabolic activity of RIGHT level 2 lymph node. 4. No evidence of metastatic progression chest, abdomen, pelvis. 5. Persistent bilateral upper lobe foci of consolidation. Favored inflammatory or infectious etiology and consider adverse drug reaction.       INTERVAL HISTORY: Please see below for problem oriented charting. She returns with her husband for further follow-up The patient was diagnosed with pneumonia and was prescribed a long course of antibiotic treatment Unfortunately, she did not complete the course of therapy She continues to have productive cough but denies fever or chills Her appetite remained poor and she is dependent on feeding tube for nutritional supplement Her pain is well controlled She denies recent falls  REVIEW OF SYSTEMS:   Constitutional: Denies fevers, chills or abnormal weight loss  Eyes: Denies blurriness of vision Ears, nose, mouth, throat, and face: Denies mucositis or sore throat Cardiovascular: Denies palpitation, chest discomfort or lower extremity swelling Gastrointestinal:  Denies nausea, heartburn or change in bowel habits Skin: Denies abnormal skin rashes Lymphatics: Denies new lymphadenopathy or easy bruising Neurological:Denies numbness, tingling or new weaknesses Behavioral/Psych: Mood is stable, no new changes  All  other systems were reviewed with the patient and are negative.  I have reviewed the past medical history, past surgical history, social history and family history with the patient and they are unchanged from previous note.  ALLERGIES:  has No Known Allergies.  MEDICATIONS:  Current Outpatient Medications  Medication Sig Dispense Refill  . Amino Acids-Protein Hydrolys (FEEDING SUPPLEMENT, PRO-STAT SUGAR FREE 64,) LIQD Place 30 mLs into feeding tube daily. 900 mL 0  . chlorhexidine (PERIDEX) 0.12 % solution Rinse with 15 mls three times daily for 30 seconds. Use after breakfast, dinner, and at bedtime. Spit out excess. Do not swallow. 480 mL prn  . chlorpheniramine-HYDROcodone (TUSSIONEX) 10-8 MG/5ML SUER Place 5 mLs into feeding tube every 12 (twelve) hours as needed for cough. 473 mL 0  . clindamycin (CLINDAGEL) 1 % gel Apply topically 2 (two) times daily. 30 g 0  . HYDROmorphone HCl (DILAUDID) 1 MG/ML LIQD Place 6 mLs (6 mg total) into feeding tube every 3 (three) hours as needed for moderate pain or severe pain. 473 mL 0  . levothyroxine (SYNTHROID, LEVOTHROID) 75 MCG tablet Take 1 tablet (75 mcg total) by mouth daily before breakfast. 30 tablet 9  . lidocaine-prilocaine (EMLA) cream APPLY TO AFFECTED AREA ONCE AS DIRECTED 30 g 3  . LORazepam (ATIVAN) 1 MG tablet TAKE 1 TABLET BY MOUTH EVERY 8 HOURS AS NEEDED 60 tablet 0  . mirtazapine (REMERON SOL-TAB) 15 MG disintegrating tablet Take 1 tablet (15 mg total) by mouth at bedtime. 30 tablet 9  . Nutritional Supplements (FEEDING SUPPLEMENT, VITAL 1.5 CAL,) LIQD D/C continuous feeding pump. Give Vital 1.5 or equivalent, 7 bottles daily, bolus feeding via PEG. Flush tube with 60 mL free water before and after bolus feeding QID. In addition, flush tube with 250 mL free water QID. Send formula and syringes. Pick up pump. 7 Can 4  . Nutritional Supplements (PROMOD) LIQD Give 2ml of promod daily via feeding tube (Mix 41ml of promod and 62ml of water).   Flush feeding tube with 19ml of water before giving promod and after. 946 mL 12  . OLANZapine (ZYPREXA) 5 MG tablet Place 1 tablet (5 mg total) into feeding tube at bedtime. 30 tablet 9  . ondansetron (ZOFRAN ODT) 8 MG disintegrating tablet Take 1 tablet (8 mg total) by mouth every 8 (eight) hours as needed for nausea or vomiting. (Patient not taking: Reported on 08/10/2016) 30 tablet 5  . ondansetron (ZOFRAN) 8 MG tablet TAKE 1 TABLET BY MOUTH 2 TIMES DAILY AS NEEDED FOR REFRACTORY NAUSEA / VOMITING. START ON DAY 3 AFTER CHEMO. 90 tablet 3  . ondansetron (ZOFRAN) 8 MG tablet Take 1 tablet (8 mg total) by mouth every 8 (eight) hours as needed for nausea or vomiting. 20 tablet 2  . polyethylene glycol (MIRALAX / GLYCOLAX) packet Place 17 g into feeding tube daily. 14 each 0  . predniSONE (DELTASONE) 50 MG tablet Take 1 tablet (50 mg total) by mouth daily with breakfast. 7 tablet 0  . prochlorperazine (COMPAZINE) 10 MG tablet TAKE 1 TABLET BY MOUTH EVERY 6 HOURS AS NEEDED FOR NAUSEA AND VOMITING 30 tablet 1  .  senna (SENOKOT) 8.6 MG tablet Take 1 tablet by mouth 2 (two) times daily.     No current facility-administered medications for this visit.    Facility-Administered Medications Ordered in Other Visits  Medication Dose Route Frequency Provider Last Rate Last Dose  . heparin lock flush 100 unit/mL  500 Units Intracatheter Once PRN Alvy Bimler, Shenekia Riess, MD      . sodium chloride flush (NS) 0.9 % injection 10 mL  10 mL Intracatheter PRN Alvy Bimler, Katie Faraone, MD        PHYSICAL EXAMINATION: ECOG PERFORMANCE STATUS: 2 - Symptomatic, <50% confined to bed  Vitals:   01/17/17 1301  BP: 100/72  Pulse: (!) 114  Resp: 18  Temp: 98.5 F (36.9 C)  SpO2: 100%   Filed Weights   01/17/17 1301  Weight: 173 lb 3.2 oz (78.6 kg)    GENERAL:alert, no distress and comfortable.  She appears frail SKIN: skin color, texture, turgor are normal, no rashes or significant lesions EYES: normal, Conjunctiva are pink and  non-injected, sclera clear OROPHARYNX:no exudate, no erythema and lips, buccal mucosa, and tongue normal  NECK: Significant lymphedema but the area beneath her chin appears to be slightly worse compared to prior exam LYMPH:  no palpable lymphadenopathy in the cervical, axillary or inguinal LUNGS: Normal breathing effort with diffuse expiratory wheezes throughout HEART: regular rate & rhythm and no murmurs and no lower extremity edema ABDOMEN:abdomen soft, non-tender and normal bowel sounds Musculoskeletal:no cyanosis of digits and no clubbing  NEURO: alert & oriented x 3 with dysarthria, no focal motor/sensory deficits  LABORATORY DATA:  I have reviewed the data as listed    Component Value Date/Time   NA 139 01/17/2017 1230   NA 141 01/02/2017 1214   K 4.3 01/17/2017 1230   K 3.7 01/02/2017 1214   CL 99 01/17/2017 1230   CO2 32 (H) 01/17/2017 1230   CO2 27 01/02/2017 1214   GLUCOSE 100 01/17/2017 1230   GLUCOSE 106 01/02/2017 1214   BUN 20 01/17/2017 1230   BUN 17.4 01/02/2017 1214   CREATININE 1.11 (H) 01/17/2017 1230   CREATININE 1.1 01/02/2017 1214   CALCIUM 9.7 01/17/2017 1230   CALCIUM 9.6 01/02/2017 1214   PROT 7.6 01/17/2017 1230   PROT 8.4 (H) 01/02/2017 1214   ALBUMIN 3.2 (L) 01/17/2017 1230   ALBUMIN 3.9 01/02/2017 1214   AST 21 01/17/2017 1230   AST 35 (H) 01/02/2017 1214   ALT 11 01/17/2017 1230   ALT 21 01/02/2017 1214   ALKPHOS 81 01/17/2017 1230   ALKPHOS 91 01/02/2017 1214   BILITOT 0.5 01/17/2017 1230   BILITOT 0.33 01/02/2017 1214   GFRNONAA 52 (L) 01/17/2017 1230   GFRAA 60 (L) 01/17/2017 1230    No results found for: SPEP, UPEP  Lab Results  Component Value Date   WBC 9.1 01/17/2017   NEUTROABS 7.6 (H) 01/17/2017   HGB 10.0 (L) 01/17/2017   HCT 29.9 (L) 01/17/2017   MCV 93.4 01/17/2017   PLT 202 01/17/2017      Chemistry      Component Value Date/Time   NA 139 01/17/2017 1230   NA 141 01/02/2017 1214   K 4.3 01/17/2017 1230   K 3.7  01/02/2017 1214   CL 99 01/17/2017 1230   CO2 32 (H) 01/17/2017 1230   CO2 27 01/02/2017 1214   BUN 20 01/17/2017 1230   BUN 17.4 01/02/2017 1214   CREATININE 1.11 (H) 01/17/2017 1230   CREATININE 1.1 01/02/2017 1214  Component Value Date/Time   CALCIUM 9.7 01/17/2017 1230   CALCIUM 9.6 01/02/2017 1214   ALKPHOS 81 01/17/2017 1230   ALKPHOS 91 01/02/2017 1214   AST 21 01/17/2017 1230   AST 35 (H) 01/02/2017 1214   ALT 11 01/17/2017 1230   ALT 21 01/02/2017 1214   BILITOT 0.5 01/17/2017 1230   BILITOT 0.33 01/02/2017 1214       RADIOGRAPHIC STUDIES: I have personally reviewed the radiological images as listed and agreed with the findings in the report. Dg Chest 2 View  Result Date: 01/03/2017 CLINICAL DATA:  Tongue cancer. Productive cough, shortness of breath, and fever since yesterday. Central chest pain. Nonsmoker. EXAM: CHEST  2 VIEW COMPARISON:  None. FINDINGS: Bilateral streaky apical lung opacities are present. These are better seen on prior PET-CT from 10/08/2016. No definite change. Elevation of the right hemidiaphragm with infiltration or atelectasis in the right lung base. This could represent pneumonia. This is new since previous studies. Heart size and pulmonary vascularity are normal. No pleural effusions. No pneumothorax. Mediastinal contours appear intact. Power port type right central venous catheter with tip over the low SVC region. IMPRESSION: New opacity in the right lung base may represent pneumonia, new since previous studies. Persistent linear opacities in the apical regions bilaterally, similar to previous studies. Electronically Signed   By: Lucienne Capers M.D.   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[place]) She is very frail even since discontinuation of chemotherapy The tumor infiltration at the base of her tongue appears to be slightly worse on exam Plan to repeat PET/CT scan in 2 weeks to reassess In the  meantime, we will continue aggressive supportive care  Acquired lymphedema She has significant lymphedema in her neck The base of her tongue/inferior portion of the chin feels more thickened than before I am concerned about possible cancer recurrence I will order a PET CT scan to reassess   Pneumonia She has chronic cough and was started on antibiotics but  she did not complete the course of treatment The patient has been coughing nonstop The cough suppressive medication seems to help but I am concerned due to her immunocompromise state I am also concerned that she may have a component of aspiration pneumonia I recommend she complete the course of antibiotic as prescribed I have refilled the prescription cough medicine for her I plan to repeat imaging study in 2 weeks and then will reassess her lungs at that time. I also recommend the addition of prednisone due to component of wheezes and COPD exacerbation.   Orders Placed This Encounter  Procedures  . NM PET Image Restag (PS) Skull Base To Thigh    Standing Status:   Future    Standing Expiration Date:   01/17/2018    Order Specific Question:   If indicated for the ordered procedure, I authorize the administration of a radiopharmaceutical per Radiology protocol    Answer:   Yes    Order Specific Question:   Preferred imaging location?    Answer:   [facility]    Order Specific Question:   Radiology Contrast Protocol - do NOT remove file path    Answer:   [email or web address]   All questions were answered. The patient knows to call the clinic with any problems, questions or concerns. No barriers to learning was detected. I spent 25 minutes counseling the patient face to face. The total time spent in the appointment was  30 minutes and more than 50% was on counseling and review of test results     [name], MD 01/18/2017 11:07 AM

## 2017-01-21 ENCOUNTER — Ambulatory Visit: Payer: 59

## 2017-01-22 ENCOUNTER — Ambulatory Visit (HOSPITAL_COMMUNITY): Payer: Self-pay

## 2017-01-22 ENCOUNTER — Ambulatory Visit (HOSPITAL_COMMUNITY)
Admission: RE | Admit: 2017-01-22 | Discharge: 2017-01-22 | Disposition: A | Discharge status: Home / Self Care | Payer: 59 | Source: Ambulatory Visit | Attending: Hematology and Oncology | Admitting: Hematology and Oncology

## 2017-01-22 DIAGNOSIS — C023 Malignant neoplasm of anterior two-thirds of tongue, part unspecified: Secondary | ICD-10-CM

## 2017-01-22 DIAGNOSIS — R1310 Dysphagia, unspecified: Secondary | ICD-10-CM

## 2017-01-22 MED FILL — PROCHLORPERAZINE 10 MG TAB: 10 | 7 days supply | Qty: 30 | Fill #1

## 2017-01-24 ENCOUNTER — Encounter: Payer: Self-pay | Admitting: Hematology and Oncology

## 2017-01-24 NOTE — Progress Notes (Signed)
Received PA request for Hydromorphone 1MG  solution.  Called [facility]([place]) to initiate.  PA approved 01/24/17-01/24/18 [id number].  Faxed to  [facility]. Fax received ok per confirmation sheet.

## 2017-01-25 MED FILL — OLANZapine 5 MG TABS: 5 | 30 days supply | Qty: 30 | Fill #1

## 2017-01-28 ENCOUNTER — Telehealth: Payer: Self-pay | Admitting: *Deleted

## 2017-01-28 ENCOUNTER — Inpatient Hospital Stay (HOSPITAL_BASED_OUTPATIENT_CLINIC_OR_DEPARTMENT_OTHER): Payer: 59 | Admitting: Hematology and Oncology

## 2017-01-28 ENCOUNTER — Telehealth: Payer: Self-pay | Admitting: Hematology and Oncology

## 2017-01-28 ENCOUNTER — Ambulatory Visit
Admission: RE | Admit: 2017-01-28 | Discharge: 2017-01-28 | Disposition: A | Discharge status: Home / Self Care | Payer: 59 | Source: Ambulatory Visit | Attending: Hematology and Oncology | Admitting: Hematology and Oncology

## 2017-01-28 DIAGNOSIS — I890 Lymphedema, not elsewhere classified: Secondary | ICD-10-CM

## 2017-01-28 DIAGNOSIS — R220 Localized swelling, mass and lump, head: Secondary | ICD-10-CM

## 2017-01-28 DIAGNOSIS — R54 Age-related physical debility: Secondary | ICD-10-CM | POA: Diagnosis not present

## 2017-01-28 DIAGNOSIS — J449 Chronic obstructive pulmonary disease, unspecified: Secondary | ICD-10-CM

## 2017-01-28 DIAGNOSIS — R590 Localized enlarged lymph nodes: Secondary | ICD-10-CM | POA: Insufficient documentation

## 2017-01-28 DIAGNOSIS — R05 Cough: Secondary | ICD-10-CM | POA: Diagnosis not present

## 2017-01-28 DIAGNOSIS — R918 Other nonspecific abnormal finding of lung field: Secondary | ICD-10-CM | POA: Diagnosis not present

## 2017-01-28 DIAGNOSIS — C023 Malignant neoplasm of anterior two-thirds of tongue, part unspecified: Secondary | ICD-10-CM | POA: Diagnosis not present

## 2017-01-28 DIAGNOSIS — Z7189 Other specified counseling: Secondary | ICD-10-CM

## 2017-01-28 DIAGNOSIS — R062 Wheezing: Secondary | ICD-10-CM | POA: Diagnosis not present

## 2017-01-28 LAB — GLUCOSE, CAPILLARY: GLUCOSE-CAPILLARY: 102 mg/dL — AB (ref 65–99)

## 2017-01-28 MED ORDER — HYDROCODONE-HOMATROPINE 5-1.5 MG/5ML PO SYRP: 5.0000 mL | ORAL_SOLUTION | Freq: Four times a day (QID) | ORAL | 0 refills | Status: AC | PRN

## 2017-01-28 MED ORDER — FLUDEOXYGLUCOSE F - 18 (FDG) INJECTION
11.1600 | Freq: Once | INTRAVENOUS | Status: AC | PRN
  Administered 2017-01-28: 11.16 via INTRAVENOUS

## 2017-01-28 MED FILL — HYDROCODONE-HOMATROPINE SYR: 5-1.5 | 12 days supply | Qty: 240 | Fill #0

## 2017-01-28 NOTE — Telephone Encounter (Signed)
Gave patient AVs and calendar of upcoming February appointments.  °

## 2017-01-28 NOTE — Telephone Encounter (Signed)
Notified of message below. Will be here at 1030.   Message to scheduler

## 2017-01-28 NOTE — Telephone Encounter (Signed)
Bring her in over after PET Add on to 1030 am, 30 mins

## 2017-01-28 NOTE — Telephone Encounter (Signed)
Husband left a message stating PET is today and appt with Dr Alvy Bimler on Thursday. Has a growth on chin and neck that showed up over weekend and "looks really bad". Is asking to see Dr Alvy Bimler sooner if possible.

## 2017-01-29 ENCOUNTER — Other Ambulatory Visit: Payer: Self-pay | Admitting: Hematology and Oncology

## 2017-01-29 ENCOUNTER — Encounter: Payer: Self-pay | Admitting: Hematology and Oncology

## 2017-01-29 DIAGNOSIS — R220 Localized swelling, mass and lump, head: Secondary | ICD-10-CM | POA: Insufficient documentation

## 2017-01-29 NOTE — Progress Notes (Signed)
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 Tongue, biopsy, Anterior deep margin left SQUAMOUS CELL CARCINOMA 3. Tongue, biopsy, Posterior deep margin left NEGATIVE FOR CARCINOMA 4. Tongue, biopsy, Posterior margin left SQUAMOUS CELL CARCINOMA 5. Tongue, excisional biopsy, New anterior deep margin NEGATIVE FOR CARCINOMA 6. Tongue, excisional biopsy, New posterior deep margin SMALL FOCUS OF SQUAMOUS CELL CARCINOMA (0.1 CM, ONLY PRESENT AT THE DEEPER PERMINENT SECTION) 7. Tongue, resection for tumor, Left lateral INFILTRATIVE KERATINIZED SQUAMOUS CELL CARCINOMA (3.2 CM) THE TUMOR INVADES UNDERNEATH MUSCLE OF THE TONGUE (2.0 CM, PT2) LYMPHOVASCULAR AND PERINEURAL INVASION IDENTIFIED SQUAMOUS CELL CARCINOMA PRESENTED AT (FINAL MARGINS REFER TO PART 2-6) LATERAL INKED MARGIN (1.0 CM) MEDIAL INKED MARGIN (0.6 CM)      02/08/2016 Surgery    SURGICAL PROCEDURES: 1. Left hemiglossectomy with primary closure. 2. Left selective neck dissection (zones I-III).      03/02/2016 Imaging    CT chest with contrast: No evidence of metastatic disease in the chest. 2. Aortic atherosclerosis.       03/02/2016 Imaging    CT neck with contrast showed : Newly enlarged and round 10 mm  right level 1b lymph node (series 5, image 45). The level 1A nodes remain normal. This right 1 B node is nonspecific but suspicious for progressive nodal metastatic disease. It might be amenable to Ultrasound-guided FNA. 2. Interval left hemiglossectomy and selective left neck dissection with confluent indeterminate soft tissue from the anterior left submandibular space through the left carotid space and obscuring the left level 1B, level 2, and level IIIa nodal stations. This study will serve as a new postoperative baseline of this area. Attention directed on close interval follow-up. 3. Mild soft tissue thickening also along the left lateral tongue resection margin. Attention directed on followup. 4. Chest CT today reported separately      03/14/2016 Procedure    She has normal baseline hearing test      03/15/2016 Procedure    1. Successful placement of a right internal jugular approach power injectable Port-A-Cath. The Port a catheter is ready for immediate use. 2. Successful fluoroscopic insertion of a 20-French pull-through gastrostomy tube. The gastrostomy may be used immediately for medication administration and may be utilized in 24 hrs for the initiation of feeds.      03/15/2016 - 04/30/2016 Radiation Therapy    Tongue and Bilateral Neck treated to 68 Gy in 33 fractions      03/16/2016 - 04/27/2016 Chemotherapy    She received weekly cisplatin. Cycle 2-6 reduced dose at 20 mg/m2 due to significant side-effects and poor tolerance       04/04/2016 Adverse Reaction    Chemotherapy is placed on hold due to acute renal failure      04/13/2016 Adverse Reaction    Chemo was placed on hold due to shingles outbreak and paronychia  05/29/2016 - 06/04/2016 Hospital Admission    She was admitted to the hospital for management of nausea, vomiting and inability to tolerate tube feeding      06/06/2016 - 06/13/2016 Hospital Admission    She was admitted to the hospital for management of nausea,  vomiting and inability to tolerate tube feeding      08/09/2016 Imaging    1. New bilateral upper lobe ground-glass nodules. Favor infectious or inflammatory process. Recommend close attention on routine follow-up. 2. Angular nodule RIGHT upper lobe is also favored infectious or inflammatory. Recommend attention on follow-up as above. 3. No mediastinal or supraclavicular lymphadenopathy. 4. Small RIGHT thyroid nodule does not meet biopsy criteria. Please see separate CT neck dictation.      08/09/2016 Imaging    1. Progression of disease with new/markedly worsened mass of the left base of tongue measuring 2.9 x 2.2 x 2.3 centimeters. Persistent soft tissue thickening of the postoperative left neck extending from the angle of the mandible to the level of the hyoid bone. 2. Confluent, centrally necrotic mass in the right submental region, at the site of previously seen enlarged level 1B lymph node. This mass extends into the floor of the mouth and displaces or invades the right sublingual gland and the right mylohyoid and hyoglossus muscles. 3. Centrally necrotic right level IIa lymph node is new from the prior study and consistent with metastatic spread. No other lower cervical lymphadenopathy. 4. Multiple ground-glass opacities in the lung apices. Please see dedicated report for concomitant chest CT for more complete characterization. 5. Thickening of the epiglottis and aryepiglottic folds is likely secondary to prior radiation treatment. No acute airway compromise.      08/22/2016 PET scan    1. The recently described progressive cervical disease is markedly hypermetabolic. This includes local recurrence in the left base of tongue and a confluent right submental nodal mass. In addition, there are small hypermetabolic lymph nodes on the right in station IIb and the anterior to the lower trachea.  2. No typical metastatic disease within the chest, abdomen or pelvis. 3. The recently described  ground-glass opacities in both upper lobes have enlarged, become more dense and are associated with moderate hypermetabolic activity. These are new from February and probably inflammatory/infectious. CT follow-up after appropriate therapy recommended.      08/27/2016 - 11/20/2016 Chemotherapy    She received palliative chemo with carboplatin/5FU and cetuximab      09/11/2016 Adverse Reaction    Dose of cetuximab is reduced due to skin toxicity      10/08/2016 PET scan    1. Decrease in metabolic activity of LEFT base of tongue lesion. 2. Decrease in size and metabolic activity of right submental adenopathy/malignancy 3. Reduction in metabolic activity of RIGHT level 2 lymph node. 4. No evidence of metastatic progression chest, abdomen, pelvis. 5. Persistent bilateral upper lobe foci of consolidation. Favored inflammatory or infectious etiology and consider adverse drug reaction.      01/28/2017 PET scan    Progression of tumor involving the left oropharynx and right floor of the mouth.  Two cervical nodes anterior to the right sternocleidomastoid muscle, new.  Multifocal patchy/nodular opacities in the bilateral upper lobes, indeterminate. However, there has been progression in the posterior right upper lobe when compared to the prior, raising the possibility of metastatic disease.  Hypermetabolic node in the right paratracheal region, nodal metastasis not excluded.       INTERVAL HISTORY: Please see below for problem oriented charting. She  returns with family members for further follow-up She had new oozing coming from an ulcerated mass under her chin This has been going on for several days She continues to have minimum cough No recent fever or chills Her pain is stable She is dependent on IV fluids daily from advanced home care service due to inability to hydrate adequately  REVIEW OF SYSTEMS:   Constitutional: Denies fevers, chills or abnormal weight loss Eyes: Denies  blurriness of vision Ears, nose, mouth, throat, and face: Denies mucositis or sore throat Cardiovascular: Denies palpitation, chest discomfort or lower extremity swelling Gastrointestinal:  Denies nausea, heartburn or change in bowel habits Skin: Denies abnormal skin rashes Lymphatics: Denies new lymphadenopathy or easy bruising Neurological:Denies numbness, tingling or new weaknesses Behavioral/Psych: Mood is stable, no new changes  All other systems were reviewed with the patient and are negative.  I have reviewed the past medical history, past surgical history, social history and family history with the patient and they are unchanged from previous note.  ALLERGIES:  has No Known Allergies.  MEDICATIONS:  Current Outpatient Medications  Medication Sig Dispense Refill  . Amino Acids-Protein Hydrolys (FEEDING SUPPLEMENT, PRO-STAT SUGAR FREE 64,) LIQD Place 30 mLs into feeding tube daily. 900 mL 0  . chlorhexidine (PERIDEX) 0.12 % solution Rinse with 15 mls three times daily for 30 seconds. Use after breakfast, dinner, and at bedtime. Spit out excess. Do not swallow. 480 mL prn  . chlorpheniramine-HYDROcodone (TUSSIONEX) 10-8 MG/5ML SUER Place 5 mLs into feeding tube every 12 (twelve) hours as needed for cough. 473 mL 0  . clindamycin (CLINDAGEL) 1 % gel Apply topically 2 (two) times daily. 30 g 0  . HYDROcodone-homatropine (HYCODAN) 5-1.5 MG/5ML syrup Place 5 mLs into feeding tube every 6 (six) hours as needed for cough. 240 mL 0  . HYDROmorphone HCl (DILAUDID) 1 MG/ML LIQD Place 6 mLs (6 mg total) into feeding tube every 3 (three) hours as needed for moderate pain or severe pain. 473 mL 0  . levothyroxine (SYNTHROID, LEVOTHROID) 75 MCG tablet Take 1 tablet (75 mcg total) by mouth daily before breakfast. 30 tablet 9  . lidocaine-prilocaine (EMLA) cream APPLY TO AFFECTED AREA ONCE AS DIRECTED 30 g 3  . LORazepam (ATIVAN) 1 MG tablet TAKE 1 TABLET BY MOUTH EVERY 8 HOURS AS NEEDED 60 tablet 0   . mirtazapine (REMERON SOL-TAB) 15 MG disintegrating tablet Take 1 tablet (15 mg total) by mouth at bedtime. 30 tablet 9  . Nutritional Supplements (FEEDING SUPPLEMENT, VITAL 1.5 CAL,) LIQD D/C continuous feeding pump. Give Vital 1.5 or equivalent, 7 bottles daily, bolus feeding via PEG. Flush tube with 60 mL free water before and after bolus feeding QID. In addition, flush tube with 250 mL free water QID. Send formula and syringes. Pick up pump. 7 Can 4  . Nutritional Supplements (PROMOD) LIQD Give 15ml of promod daily via feeding tube (Mix 65ml of promod and 46ml of water).  Flush feeding tube with 58ml of water before giving promod and after. 946 mL 12  . OLANZapine (ZYPREXA) 5 MG tablet Place 1 tablet (5 mg total) into feeding tube at bedtime. 30 tablet 9  . ondansetron (ZOFRAN ODT) 8 MG disintegrating tablet Take 1 tablet (8 mg total) by mouth every 8 (eight) hours as needed for nausea or vomiting. (Patient not taking: Reported on 08/10/2016) 30 tablet 5  . ondansetron (ZOFRAN) 8 MG tablet TAKE 1 TABLET BY MOUTH 2 TIMES DAILY AS NEEDED FOR REFRACTORY NAUSEA / VOMITING. START ON  DAY 3 AFTER CHEMO. 90 tablet 3  . ondansetron (ZOFRAN) 8 MG tablet Take 1 tablet (8 mg total) by mouth every 8 (eight) hours as needed for nausea or vomiting. 20 tablet 2  . polyethylene glycol (MIRALAX / GLYCOLAX) packet Place 17 g into feeding tube daily. 14 each 0  . prochlorperazine (COMPAZINE) 10 MG tablet TAKE 1 TABLET BY MOUTH EVERY 6 HOURS AS NEEDED FOR NAUSEA AND VOMITING 30 tablet 1  . senna (SENOKOT) 8.6 MG tablet Take 1 tablet by mouth 2 (two) times daily.     No current facility-administered medications for this visit.    Facility-Administered Medications Ordered in Other Visits  Medication Dose Route Frequency Provider Last Rate Last Dose  . heparin lock flush 100 unit/mL  500 Units Intracatheter Once PRN Alvy Bimler, Fredrica Capano, MD      . sodium chloride flush (NS) 0.9 % injection 10 mL  10 mL Intracatheter PRN  Alvy Bimler, Aggie Douse, MD        PHYSICAL EXAMINATION: ECOG PERFORMANCE STATUS: 2 - Symptomatic, <50% confined to bed  Vitals:   01/28/17 1238  BP: 109/73  Pulse: (!) 106  Resp: 18  Temp: 98.9 F (37.2 C)  SpO2: 100%   Filed Weights   01/28/17 1238  Weight: 167 lb (75.8 kg)    GENERAL:alert, no distress and comfortable SKIN: skin color, texture, turgor are normal, no rashes or significant lesions EYES: normal, Conjunctiva are pink and non-injected, sclera clear OROPHARYNX: No ulcerated mass within the oropharynx NECK: Ulcerated tumor mass under the chin which is oozing serosanguineous fluid.  That was bandaged LYMPH:   palpable lymphadenopathy on the right side of her neck LUNGS: clear to auscultation and percussion with normal breathing effort HEART: regular rate & rhythm and no murmurs and no lower extremity edema ABDOMEN:abdomen soft, non-tender and normal bowel sounds Musculoskeletal:no cyanosis of digits and no clubbing  NEURO: alert & oriented x 3 with fluent speech, no focal motor/sensory deficits  LABORATORY DATA:  I have reviewed the data as listed    Component Value Date/Time   NA 139 01/17/2017 1230   NA 141 01/02/2017 1214   K 4.3 01/17/2017 1230   K 3.7 01/02/2017 1214   CL 99 01/17/2017 1230   CO2 32 (H) 01/17/2017 1230   CO2 27 01/02/2017 1214   GLUCOSE 100 01/17/2017 1230   GLUCOSE 106 01/02/2017 1214   BUN 20 01/17/2017 1230   BUN 17.4 01/02/2017 1214   CREATININE 1.11 (H) 01/17/2017 1230   CREATININE 1.1 01/02/2017 1214   CALCIUM 9.7 01/17/2017 1230   CALCIUM 9.6 01/02/2017 1214   PROT 7.6 01/17/2017 1230   PROT 8.4 (H) 01/02/2017 1214   ALBUMIN 3.2 (L) 01/17/2017 1230   ALBUMIN 3.9 01/02/2017 1214   AST 21 01/17/2017 1230   AST 35 (H) 01/02/2017 1214   ALT 11 01/17/2017 1230   ALT 21 01/02/2017 1214   ALKPHOS 81 01/17/2017 1230   ALKPHOS 91 01/02/2017 1214   BILITOT 0.5 01/17/2017 1230   BILITOT 0.33 01/02/2017 1214   GFRNONAA 52 (L) 01/17/2017  1230   GFRAA 60 (L) 01/17/2017 1230    No results found for: SPEP, UPEP  Lab Results  Component Value Date   WBC 9.1 01/17/2017   NEUTROABS 7.6 (H) 01/17/2017   HGB 10.0 (L) 01/17/2017   HCT 29.9 (L) 01/17/2017   MCV 93.4 01/17/2017   PLT 202 01/17/2017      Chemistry      Component Value Date/Time  NA 139 01/17/2017 1230   NA 141 01/02/2017 1214   K 4.3 01/17/2017 1230   K 3.7 01/02/2017 1214   CL 99 01/17/2017 1230   CO2 32 (H) 01/17/2017 1230   CO2 27 01/02/2017 1214   BUN 20 01/17/2017 1230   BUN 17.4 01/02/2017 1214   CREATININE 1.11 (H) 01/17/2017 1230   CREATININE 1.1 01/02/2017 1214      Component Value Date/Time   CALCIUM 9.7 01/17/2017 1230   CALCIUM 9.6 01/02/2017 1214   ALKPHOS 81 01/17/2017 1230   ALKPHOS 91 01/02/2017 1214   AST 21 01/17/2017 1230   AST 35 (H) 01/02/2017 1214   ALT 11 01/17/2017 1230   ALT 21 01/02/2017 1214   BILITOT 0.5 01/17/2017 1230   BILITOT 0.33 01/02/2017 1214       RADIOGRAPHIC STUDIES: I have reviewed imaging study with family I have personally reviewed the radiological images as listed and agreed with the findings in the report. Dg Chest 2 View  Result Date: 01/03/2017 CLINICAL DATA:  Tongue cancer. Productive cough, shortness of breath, and fever since yesterday. Central chest pain. Nonsmoker. EXAM: CHEST  2 VIEW COMPARISON:  None. FINDINGS: Bilateral streaky apical lung opacities are present. These are better seen on prior PET-CT from 10/08/2016. No definite change. Elevation of the right hemidiaphragm with infiltration or atelectasis in the right lung base. This could represent pneumonia. This is new since previous studies. Heart size and pulmonary vascularity are normal. No pleural effusions. No pneumothorax. Mediastinal contours appear intact. Power port type right central venous catheter with tip over the low SVC region. IMPRESSION: New opacity in the right lung base may represent pneumonia, new since previous studies.  Persistent linear opacities in the apical regions bilaterally, similar to previous studies. Electronically Signed   By: Lucienne Capers M.D.   On: 01/03/2017 20:47   Nm Pet Image Restag (ps) Skull Base To Thigh  Result Date: 01/28/2017 CLINICAL DATA:  Subsequent treatment strategy for left tongue cancer status post hemiglossectomy. EXAM: NUCLEAR MEDICINE PET SKULL BASE TO THIGH TECHNIQUE: 11.16 mCi F-18 FDG was injected intravenously. Full-ring PET imaging was performed from the skull base to thigh after the radiotracer. CT data was obtained and used for attenuation correction and anatomic localization. FASTING BLOOD GLUCOSE:  Value: 102 mg/dl COMPARISON:  PET-CT dated 10/08/2016 FINDINGS: NECK: 3.6 x 2.4 cm hypermetabolic mass along the left oropharynx, along the posterior aspect of the left surgical margin, max SUV 10.9, likely reflecting local recurrence. Previously, this measured 2.9 x 1.8 cm with max SUV 7.4. 4.4 x 6.2 cm hypermetabolic mass along the right floor of the mouth (series 3/image 51), with suspected involvement of the right mandible, max SUV 13.0. Previously, this measured 2.8 x 3.8 cm with max SUV 6.7. Two lymph nodes anterior to the right sternocleidomastoid muscle measuring 8-10 mm (series 3/images 54 and 57), max SUV 7.6, new. CHEST: Multifocal patchy/nodular opacities in the bilateral upper lobes. 4.0 cm patchy opacity in the anterior right lung apex (series 3/image 6.3) with max SUV 3.6, previously a more confluent 2.7 cm opacity with max SUV 4.1. Patchy left upper lobe opacities including a 1.8 cm subsolid anterior left upper lobe opacity (series 3/image 65) with max SUV 3.2, previously a more solid nodular opacity measuring 1.8 cm with max SUV 2.7. Patchy/nodular posterior right upper lobe opacity measuring 2.0 cm (series 3/image 69), new/increased, max SUV 4.3. Hypermetabolism in the right paratracheal region, max SUV 6.7, new. Mild cardiomegaly. Trace pericardial effusion. No  evidence  of thoracic aortic aneurysm. Mild atherosclerotic calcifications of the aortic arch. Right chest port terminates at the cavoatrial junction. ABDOMEN/PELVIS: No abnormal hypermetabolism in the liver, spleen, pancreas, or bilateral adrenal glands. No hypermetabolic lymphadenopathy in the abdomen/pelvis. Status post cholecystectomy. Gastrostomy in satisfactory position. Sigmoid diverticulosis, without evidence of diverticulitis. SKELETON: No focal hypermetabolic activity to suggest skeletal metastasis. IMPRESSION: Progression of tumor involving the left oropharynx and right floor of the mouth. Two cervical nodes anterior to the right sternocleidomastoid muscle, new. Multifocal patchy/nodular opacities in the bilateral upper lobes, indeterminate. However, there has been progression in the posterior right upper lobe when compared to the prior, raising the possibility of metastatic disease. Hypermetabolic node in the right paratracheal region, nodal metastasis not excluded. Electronically Signed   By: Julian Hy M.D.   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[place]) Unfortunately, PET/CT scan show significant disease progression She also have an ulcerated tumor mass under her chin which is oozing serosanguineous fluid The patient is very frail I do not believe she can tolerate further chemotherapy Immunotherapy is a consideration Her prognosis is poor I recommend family meeting next week for further discussion about goals of care and she agreed  Mass of chin She has an ulcerated tumor mass that is oozing serosanguineous fluid I put a clean bandage/dressing over it I will get advanced home care nursing staff to continue to monitor the wound  Goals of care, counseling/discussion We have extensive goals of care discussion today The patient is frail She is dependent on daily IV fluids due to inability to eat She had severe side effects from prior  chemo Overall, her prognosis is very poor due to refractory state of her disease despite treatment The patient is not able to make medical decision today I recommend return appointment next week for further discussion of goals of care and treatment options and she agreed   No orders of the defined types were placed in this encounter.  All questions were answered. The patient knows to call the clinic with any problems, questions or concerns. No barriers to learning was detected. I spent 40 minutes counseling the patient face to face. The total time spent in the appointment was 55 minutes and more than 50% was on counseling and review of test results     [name], MD 01/29/2017 12:45 PM

## 2017-01-29 NOTE — Assessment & Plan Note (Signed)
She has an ulcerated tumor mass that is oozing serosanguineous fluid I put a clean bandage/dressing over it I will get advanced home care nursing staff to continue to monitor the wound

## 2017-01-29 NOTE — Assessment & Plan Note (Signed)
Unfortunately, PET/CT scan show significant disease progression She also have an ulcerated tumor mass under her chin which is oozing serosanguineous fluid The patient is very frail I do not believe she can tolerate further chemotherapy Immunotherapy is a consideration Her prognosis is poor I recommend family meeting next week for further discussion about goals of care and she agreed

## 2017-01-29 NOTE — Assessment & Plan Note (Signed)
We have extensive goals of care discussion today The patient is frail She is dependent on daily IV fluids due to inability to eat She had severe side effects from prior chemo Overall, her prognosis is very poor due to refractory state of her disease despite treatment The patient is not able to make medical decision today I recommend return appointment next week for further discussion of goals of care and treatment options and she agreed

## 2017-01-30 NOTE — Telephone Encounter (Signed)
pls refill electronically if possible or wait till next week

## 2017-01-31 ENCOUNTER — Ambulatory Visit: Payer: Self-pay | Admitting: Hematology and Oncology

## 2017-02-05 ENCOUNTER — Telehealth: Payer: Self-pay | Admitting: Hematology and Oncology

## 2017-02-05 ENCOUNTER — Inpatient Hospital Stay: Payer: 59 | Attending: Hematology and Oncology | Admitting: Hematology and Oncology

## 2017-02-05 DIAGNOSIS — R1310 Dysphagia, unspecified: Secondary | ICD-10-CM

## 2017-02-05 DIAGNOSIS — Z9189 Other specified personal risk factors, not elsewhere classified: Secondary | ICD-10-CM

## 2017-02-05 DIAGNOSIS — C023 Malignant neoplasm of anterior two-thirds of tongue, part unspecified: Secondary | ICD-10-CM | POA: Diagnosis not present

## 2017-02-05 DIAGNOSIS — Z7189 Other specified counseling: Secondary | ICD-10-CM | POA: Diagnosis not present

## 2017-02-05 DIAGNOSIS — E860 Dehydration: Secondary | ICD-10-CM

## 2017-02-05 DIAGNOSIS — R220 Localized swelling, mass and lump, head: Secondary | ICD-10-CM | POA: Diagnosis not present

## 2017-02-05 DIAGNOSIS — G893 Neoplasm related pain (acute) (chronic): Secondary | ICD-10-CM

## 2017-02-05 MED ORDER — FENTANYL 25 MCG/HR TD PT72: 25.0000 ug | MEDICATED_PATCH | TRANSDERMAL | 0 refills | Status: DC

## 2017-02-05 MED ORDER — HYDROMORPHONE HCL 1 MG/ML PO LIQD: 6.0000 mg | ORAL | 0 refills | Status: DC | PRN

## 2017-02-05 MED FILL — fentaNYL 25 MCG/HR PT72: 25 | 15 days supply | Qty: 5 | Fill #0

## 2017-02-05 MED FILL — HYDROMORPHONE 5 MG/5 ML SOL: 1 | 9 days supply | Qty: 473 | Fill #0

## 2017-02-05 NOTE — Telephone Encounter (Signed)
Gave patient AVs and calendar of upcoming February appointments.  °

## 2017-02-06 ENCOUNTER — Telehealth: Payer: Self-pay | Admitting: Hematology and Oncology

## 2017-02-06 ENCOUNTER — Encounter: Payer: Self-pay | Admitting: Hematology and Oncology

## 2017-02-06 DIAGNOSIS — Z9189 Other specified personal risk factors, not elsewhere classified: Secondary | ICD-10-CM | POA: Insufficient documentation

## 2017-02-06 NOTE — Assessment & Plan Note (Signed)
She is at very high risk of recurrent aspiration pneumonia She has been treated back to back with 2 courses of antibiotics recently On exam today, she appears to have difficulties clearing his secretion Unfortunately, I have no good option. She has significant disease progression that causes oropharyngeal narrowing and difficulties with clearing secretions She will probably need chronic antibiotic therapy in the future

## 2017-02-06 NOTE — Assessment & Plan Note (Signed)
She has significant dysphagia due to prior radiation exposure She had worsening dysphagia due to cancer progression There is no real benefit for further evaluation/management through speech and language therapy/rehab at this point

## 2017-02-06 NOTE — Assessment & Plan Note (Signed)
She has poorly controlled pain In the past, the patient was reluctant to start fentanyl patch With worsening pain control, I plan to add fentanyl patch 25 mcg I will reassess pain control in 2 weeks I also refilled her prescription of Dilaudid to take as breakthrough pain medicine

## 2017-02-06 NOTE — Assessment & Plan Note (Signed)
She is chronically dehydrated due to inability to take oral intake Despite having feeding tube, she is dependent on daily IV fluid hydration We will continue the same

## 2017-02-06 NOTE — Progress Notes (Signed)
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 Tongue, biopsy, Anterior deep margin left SQUAMOUS CELL CARCINOMA 3. Tongue, biopsy, Posterior deep margin left NEGATIVE FOR CARCINOMA 4. Tongue, biopsy, Posterior margin left SQUAMOUS CELL CARCINOMA 5. Tongue, excisional biopsy, New anterior deep margin NEGATIVE FOR CARCINOMA 6. Tongue, excisional biopsy, New posterior deep margin SMALL FOCUS OF SQUAMOUS CELL CARCINOMA (0.1 CM, ONLY PRESENT AT THE DEEPER PERMINENT SECTION) 7. Tongue, resection for tumor, Left lateral INFILTRATIVE KERATINIZED SQUAMOUS CELL CARCINOMA (3.2 CM) THE TUMOR INVADES UNDERNEATH MUSCLE OF THE TONGUE (2.0 CM, PT2) LYMPHOVASCULAR AND PERINEURAL INVASION IDENTIFIED SQUAMOUS CELL CARCINOMA PRESENTED AT (FINAL MARGINS REFER TO PART 2-6) LATERAL INKED MARGIN (1.0 CM) MEDIAL INKED MARGIN (0.6 CM)      02/08/2016 Surgery    SURGICAL PROCEDURES: 1. Left hemiglossectomy with primary closure. 2. Left selective neck dissection (zones I-III).      03/02/2016 Imaging    CT chest with contrast: No evidence of metastatic disease in the chest. 2. Aortic atherosclerosis.       03/02/2016 Imaging    CT neck with contrast showed : Newly enlarged and round 10 mm  right level 1b lymph node (series 5, image 45). The level 1A nodes remain normal. This right 1 B node is nonspecific but suspicious for progressive nodal metastatic disease. It might be amenable to Ultrasound-guided FNA. 2. Interval left hemiglossectomy and selective left neck dissection with confluent indeterminate soft tissue from the anterior left submandibular space through the left carotid space and obscuring the left level 1B, level 2, and level IIIa nodal stations. This study will serve as a new postoperative baseline of this area. Attention directed on close interval follow-up. 3. Mild soft tissue thickening also along the left lateral tongue resection margin. Attention directed on followup. 4. Chest CT today reported separately      03/14/2016 Procedure    She has normal baseline hearing test      03/15/2016 Procedure    1. Successful placement of a right internal jugular approach power injectable Port-A-Cath. The Port a catheter is ready for immediate use. 2. Successful fluoroscopic insertion of a 20-French pull-through gastrostomy tube. The gastrostomy may be used immediately for medication administration and may be utilized in 24 hrs for the initiation of feeds.      03/15/2016 - 04/30/2016 Radiation Therapy    Tongue and Bilateral Neck treated to 68 Gy in 33 fractions      03/16/2016 - 04/27/2016 Chemotherapy    She received weekly cisplatin. Cycle 2-6 reduced dose at 20 mg/m2 due to significant side-effects and poor tolerance       04/04/2016 Adverse Reaction    Chemotherapy is placed on hold due to acute renal failure      04/13/2016 Adverse Reaction    Chemo was placed on hold due to shingles outbreak and paronychia  05/29/2016 - 06/04/2016 Hospital Admission    She was admitted to the hospital for management of nausea, vomiting and inability to tolerate tube feeding      06/06/2016 - 06/13/2016 Hospital Admission    She was admitted to the hospital for management of nausea,  vomiting and inability to tolerate tube feeding      08/09/2016 Imaging    1. New bilateral upper lobe ground-glass nodules. Favor infectious or inflammatory process. Recommend close attention on routine follow-up. 2. Angular nodule RIGHT upper lobe is also favored infectious or inflammatory. Recommend attention on follow-up as above. 3. No mediastinal or supraclavicular lymphadenopathy. 4. Small RIGHT thyroid nodule does not meet biopsy criteria. Please see separate CT neck dictation.      08/09/2016 Imaging    1. Progression of disease with new/markedly worsened mass of the left base of tongue measuring 2.9 x 2.2 x 2.3 centimeters. Persistent soft tissue thickening of the postoperative left neck extending from the angle of the mandible to the level of the hyoid bone. 2. Confluent, centrally necrotic mass in the right submental region, at the site of previously seen enlarged level 1B lymph node. This mass extends into the floor of the mouth and displaces or invades the right sublingual gland and the right mylohyoid and hyoglossus muscles. 3. Centrally necrotic right level IIa lymph node is new from the prior study and consistent with metastatic spread. No other lower cervical lymphadenopathy. 4. Multiple ground-glass opacities in the lung apices. Please see dedicated report for concomitant chest CT for more complete characterization. 5. Thickening of the epiglottis and aryepiglottic folds is likely secondary to prior radiation treatment. No acute airway compromise.      08/22/2016 PET scan    1. The recently described progressive cervical disease is markedly hypermetabolic. This includes local recurrence in the left base of tongue and a confluent right submental nodal mass. In addition, there are small hypermetabolic lymph nodes on the right in station IIb and the anterior to the lower trachea.  2. No typical metastatic disease within the chest, abdomen or pelvis. 3. The recently described  ground-glass opacities in both upper lobes have enlarged, become more dense and are associated with moderate hypermetabolic activity. These are new from February and probably inflammatory/infectious. CT follow-up after appropriate therapy recommended.      08/27/2016 - 11/20/2016 Chemotherapy    She received palliative chemo with carboplatin/5FU and cetuximab      09/11/2016 Adverse Reaction    Dose of cetuximab is reduced due to skin toxicity      10/08/2016 PET scan    1. Decrease in metabolic activity of LEFT base of tongue lesion. 2. Decrease in size and metabolic activity of right submental adenopathy/malignancy 3. Reduction in metabolic activity of RIGHT level 2 lymph node. 4. No evidence of metastatic progression chest, abdomen, pelvis. 5. Persistent bilateral upper lobe foci of consolidation. Favored inflammatory or infectious etiology and consider adverse drug reaction.      01/28/2017 PET scan    Progression of tumor involving the left oropharynx and right floor of the mouth.  Two cervical nodes anterior to the right sternocleidomastoid muscle, new.  Multifocal patchy/nodular opacities in the bilateral upper lobes, indeterminate. However, there has been progression in the posterior right upper lobe when compared to the prior, raising the possibility of metastatic disease.  Hypermetabolic node in the right paratracheal region, nodal metastasis not excluded.       INTERVAL HISTORY: Please see below for problem oriented charting. She  returns with her husband, daughter and 2 sisters today The patient is very tearful  Her pain is severe and progressive since last time I saw her The drainage under her chin is managed by dressing changes twice a day Family members noted she has more gurgling and difficulties clearing secretions She is coughing nonstop on a daily basis with poor sleep She is dependent on IV fluids daily and feeding tube for nutrition Her husband noted she is  getting weaker and is spending more than half a day lying down to rest There is no reported fever or chills  REVIEW OF SYSTEMS:   Constitutional: Denies fevers, chills or abnormal weight loss Eyes: Denies blurriness of vision Ears, nose, mouth, throat, and face: Denies mucositis or sore throat Cardiovascular: Denies palpitation, chest discomfort or lower extremity swelling Gastrointestinal:  Denies nausea, heartburn or change in bowel habits Skin: Denies abnormal skin rashes Lymphatics: Denies new lymphadenopathy or easy bruising Behavioral/Psych: Mood is stable, no new changes  All other systems were reviewed with the patient and are negative.  I have reviewed the past medical history, past surgical history, social history and family history with the patient and they are unchanged from previous note.  ALLERGIES:  has No Known Allergies.  MEDICATIONS:  Current Outpatient Medications  Medication Sig Dispense Refill  . Amino Acids-Protein Hydrolys (FEEDING SUPPLEMENT, PRO-STAT SUGAR FREE 64,) LIQD Place 30 mLs into feeding tube daily. 900 mL 0  . chlorhexidine (PERIDEX) 0.12 % solution Rinse with 15 mls three times daily for 30 seconds. Use after breakfast, dinner, and at bedtime. Spit out excess. Do not swallow. 480 mL prn  . chlorpheniramine-HYDROcodone (TUSSIONEX) 10-8 MG/5ML SUER Place 5 mLs into feeding tube every 12 (twelve) hours as needed for cough. 473 mL 0  . clindamycin (CLINDAGEL) 1 % gel Apply topically 2 (two) times daily. 30 g 0  . fentaNYL (DURAGESIC - DOSED MCG/HR) 25 MCG/HR patch Place 1 patch (25 mcg total) onto the skin every 3 (three) days. 5 patch 0  . HYDROcodone-homatropine (HYCODAN) 5-1.5 MG/5ML syrup Place 5 mLs into feeding tube every 6 (six) hours as needed for cough. 240 mL 0  . HYDROmorphone HCl (DILAUDID) 1 MG/ML LIQD Place 6 mLs (6 mg total) into feeding tube every 3 (three) hours as needed for moderate pain or severe pain. 473 mL 0  . levothyroxine  (SYNTHROID, LEVOTHROID) 75 MCG tablet Take 1 tablet (75 mcg total) by mouth daily before breakfast. 30 tablet 9  . lidocaine-prilocaine (EMLA) cream APPLY TO AFFECTED AREA ONCE AS DIRECTED 30 g 3  . LORazepam (ATIVAN) 1 MG tablet TAKE 1 TABLET BY MOUTH EVERY 8 HOURS 60 tablet 0  . mirtazapine (REMERON SOL-TAB) 15 MG disintegrating tablet Take 1 tablet (15 mg total) by mouth at bedtime. 30 tablet 9  . Nutritional Supplements (FEEDING SUPPLEMENT, VITAL 1.5 CAL,) LIQD D/C continuous feeding pump. Give Vital 1.5 or equivalent, 7 bottles daily, bolus feeding via PEG. Flush tube with 60 mL free water before and after bolus feeding QID. In addition, flush tube with 250 mL free water QID. Send formula and syringes. Pick up pump. 7 Can 4  . Nutritional Supplements (PROMOD) LIQD Give 56ml of promod daily via feeding tube (Mix 32ml of promod and 24ml of water).  Flush feeding tube with 43ml of water before giving promod and after. 946 mL 12  . OLANZapine (ZYPREXA) 5 MG tablet Place 1 tablet (5 mg total) into feeding tube at bedtime. 30 tablet 9  .  ondansetron (ZOFRAN ODT) 8 MG disintegrating tablet Take 1 tablet (8 mg total) by mouth every 8 (eight) hours as needed for nausea or vomiting. (Patient not taking: Reported on 08/10/2016) 30 tablet 5  . ondansetron (ZOFRAN) 8 MG tablet TAKE 1 TABLET BY MOUTH 2 TIMES DAILY AS NEEDED FOR REFRACTORY NAUSEA / VOMITING. START ON DAY 3 AFTER CHEMO. 90 tablet 3  . ondansetron (ZOFRAN) 8 MG tablet Take 1 tablet (8 mg total) by mouth every 8 (eight) hours as needed for nausea or vomiting. 20 tablet 2  . polyethylene glycol (MIRALAX / GLYCOLAX) packet Place 17 g into feeding tube daily. 14 each 0  . prochlorperazine (COMPAZINE) 10 MG tablet TAKE 1 TABLET BY MOUTH EVERY 6 HOURS AS NEEDED FOR NAUSEA AND VOMITING 30 tablet 1  . senna (SENOKOT) 8.6 MG tablet Take 1 tablet by mouth 2 (two) times daily.     No current facility-administered medications for this visit.     Facility-Administered Medications Ordered in Other Visits  Medication Dose Route Frequency Provider Last Rate Last Dose  . heparin lock flush 100 unit/mL  500 Units Intracatheter Once PRN Alvy Bimler, Shadavia Dampier, MD      . sodium chloride flush (NS) 0.9 % injection 10 mL  10 mL Intracatheter PRN Alvy Bimler, Darious Rehman, MD        PHYSICAL EXAMINATION: ECOG PERFORMANCE STATUS: 3 - Symptomatic, >50% confined to bed  Vitals:   02/05/17 1428  BP: 121/71  Pulse: (!) 112  Resp: 18  Temp: 98.8 F (37.1 C)  SpO2: 100%   Filed Weights   02/05/17 1428  Weight: 165 lb 3.2 oz (74.9 kg)    GENERAL:alert, she appears to be in pain SKIN: The area under the chin is bandage.  No serous sanguinous fluid is seen EYES: normal, Conjunctiva are pink and non-injected, sclera clear OROPHARYNX: She has difficulties closing her mouth.  No oral thrush is noted.  Significant gurgling and difficulties clearing secretion is noted with ongoing cough NEURO: alert & oriented x 3 with fluent speech, no focal motor/sensory deficits  LABORATORY DATA:  I have reviewed the data as listed    Component Value Date/Time   NA 139 01/17/2017 1230   NA 141 01/02/2017 1214   K 4.3 01/17/2017 1230   K 3.7 01/02/2017 1214   CL 99 01/17/2017 1230   CO2 32 (H) 01/17/2017 1230   CO2 27 01/02/2017 1214   GLUCOSE 100 01/17/2017 1230   GLUCOSE 106 01/02/2017 1214   BUN 20 01/17/2017 1230   BUN 17.4 01/02/2017 1214   CREATININE 1.11 (H) 01/17/2017 1230   CREATININE 1.1 01/02/2017 1214   CALCIUM 9.7 01/17/2017 1230   CALCIUM 9.6 01/02/2017 1214   PROT 7.6 01/17/2017 1230   PROT 8.4 (H) 01/02/2017 1214   ALBUMIN 3.2 (L) 01/17/2017 1230   ALBUMIN 3.9 01/02/2017 1214   AST 21 01/17/2017 1230   AST 35 (H) 01/02/2017 1214   ALT 11 01/17/2017 1230   ALT 21 01/02/2017 1214   ALKPHOS 81 01/17/2017 1230   ALKPHOS 91 01/02/2017 1214   BILITOT 0.5 01/17/2017 1230   BILITOT 0.33 01/02/2017 1214   GFRNONAA 52 (L) 01/17/2017 1230   GFRAA 60 (L)  01/17/2017 1230    No results found for: SPEP, UPEP  Lab Results  Component Value Date   WBC 9.1 01/17/2017   NEUTROABS 7.6 (H) 01/17/2017   HGB 10.0 (L) 01/17/2017   HCT 29.9 (L) 01/17/2017   MCV 93.4 01/17/2017   PLT 202  01/17/2017      Chemistry      Component Value Date/Time   NA 139 01/17/2017 1230   NA 141 01/02/2017 1214   K 4.3 01/17/2017 1230   K 3.7 01/02/2017 1214   CL 99 01/17/2017 1230   CO2 32 (H) 01/17/2017 1230   CO2 27 01/02/2017 1214   BUN 20 01/17/2017 1230   BUN 17.4 01/02/2017 1214   CREATININE 1.11 (H) 01/17/2017 1230   CREATININE 1.1 01/02/2017 1214      Component Value Date/Time   CALCIUM 9.7 01/17/2017 1230   CALCIUM 9.6 01/02/2017 1214   ALKPHOS 81 01/17/2017 1230   ALKPHOS 91 01/02/2017 1214   AST 21 01/17/2017 1230   AST 35 (H) 01/02/2017 1214   ALT 11 01/17/2017 1230   ALT 21 01/02/2017 1214   BILITOT 0.5 01/17/2017 1230   BILITOT 0.33 01/02/2017 1214       RADIOGRAPHIC STUDIES: I have reviewed multiple imaging study with the patient and family I have personally reviewed the radiological images as listed and agreed with the findings in the report. Nm Pet Image Restag (ps) Skull Base To Thigh  Result Date: 01/28/2017 CLINICAL DATA:  Subsequent treatment strategy for left tongue cancer status post hemiglossectomy. EXAM: NUCLEAR MEDICINE PET SKULL BASE TO THIGH TECHNIQUE: 11.16 mCi F-18 FDG was injected intravenously. Full-ring PET imaging was performed from the skull base to thigh after the radiotracer. CT data was obtained and used for attenuation correction and anatomic localization. FASTING BLOOD GLUCOSE:  Value: 102 mg/dl COMPARISON:  PET-CT dated 10/08/2016 FINDINGS: NECK: 3.6 x 2.4 cm hypermetabolic mass along the left oropharynx, along the posterior aspect of the left surgical margin, max SUV 10.9, likely reflecting local recurrence. Previously, this measured 2.9 x 1.8 cm with max SUV 7.4. 4.4 x 6.2 cm hypermetabolic mass along the  right floor of the mouth (series 3/image 51), with suspected involvement of the right mandible, max SUV 13.0. Previously, this measured 2.8 x 3.8 cm with max SUV 6.7. Two lymph nodes anterior to the right sternocleidomastoid muscle measuring 8-10 mm (series 3/images 54 and 57), max SUV 7.6, new. CHEST: Multifocal patchy/nodular opacities in the bilateral upper lobes. 4.0 cm patchy opacity in the anterior right lung apex (series 3/image 6.3) with max SUV 3.6, previously a more confluent 2.7 cm opacity with max SUV 4.1. Patchy left upper lobe opacities including a 1.8 cm subsolid anterior left upper lobe opacity (series 3/image 65) with max SUV 3.2, previously a more solid nodular opacity measuring 1.8 cm with max SUV 2.7. Patchy/nodular posterior right upper lobe opacity measuring 2.0 cm (series 3/image 69), new/increased, max SUV 4.3. Hypermetabolism in the right paratracheal region, max SUV 6.7, new. Mild cardiomegaly. Trace pericardial effusion. No evidence of thoracic aortic aneurysm. Mild atherosclerotic calcifications of the aortic arch. Right chest port terminates at the cavoatrial junction. ABDOMEN/PELVIS: No abnormal hypermetabolism in the liver, spleen, pancreas, or bilateral adrenal glands. No hypermetabolic lymphadenopathy in the abdomen/pelvis. Status post cholecystectomy. Gastrostomy in satisfactory position. Sigmoid diverticulosis, without evidence of diverticulitis. SKELETON: No focal hypermetabolic activity to suggest skeletal metastasis. IMPRESSION: Progression of tumor involving the left oropharynx and right floor of the mouth. Two cervical nodes anterior to the right sternocleidomastoid muscle, new. Multifocal patchy/nodular opacities in the bilateral upper lobes, indeterminate. However, there has been progression in the posterior right upper lobe when compared to the prior, raising the possibility of metastatic disease. Hypermetabolic node in the right paratracheal region, nodal metastasis not  excluded. Electronically Signed  By: Julian Hy M.D.   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[place]) I have review imaging study and plan of care with the patient and family members extensively The patient have significant disease progression The tumor is bulky, causing difficulties closing her mouth, difficulties with clearance of oral secretion, worsening dysphagia, worsening pain and also drainage under her chin Her prognosis is very poor The patient is very debilitated after her last cycle of chemotherapy I estimated her ECOG performance status score at 3 At this point, I do not believe she would tolerate any further systemic treatment If she recovers somewhat, we can consider immunotherapy although I have suspicion it may not work well for her and might predispose her with severe side effects Ultimately, she is undecided Family members wants to continue aggressive supportive care through advanced home care I plan to bring her back again in 2 weeks for further discussion  Cancer associated pain She has poorly controlled pain In the past, the patient was reluctant to start fentanyl patch With worsening pain control, I plan to add fentanyl patch 25 mcg I will reassess pain control in 2 weeks I also refilled her prescription of Dilaudid to take as breakthrough pain medicine  Dysphagia She has significant dysphagia due to prior radiation exposure She had worsening dysphagia due to cancer progression There is no real benefit for further evaluation/management through speech and language therapy/rehab at this point  Mass of chin The mass under her chin represent necrotic tumor that is draining Continue aggressive wound care There is no surgical options and she will not benefit from radiation treatment  Dehydration She is chronically dehydrated due to inability to take oral intake Despite having feeding tube, she is dependent on  daily IV fluid hydration We will continue the same  At risk for aspiration pneumonia She is at very high risk of recurrent aspiration pneumonia She has been treated back to back with 2 courses of antibiotics recently On exam today, she appears to have difficulties clearing his secretion Unfortunately, I have no good option. She has significant disease progression that causes oropharyngeal narrowing and difficulties with clearing secretions She will probably need chronic antibiotic therapy in the future  Goals of care, counseling/discussion We have extensive goals of care discussion today The patient is frail She is dependent on daily IV fluids due to inability to eat She had severe side effects from prior chemo She had recurrent courses of aspiration pneumonia and is at high risk of recurrent aspiration Overall, her prognosis is very poor due to refractory state of her disease despite treatment The patient is not able to make medical decision today I have a feeling the patient and family members are not willing to accept palliative care or hospice Ultimately, I recommend aggressive wound care and pain management today I will bring her back again in 2 weeks for further assessment and further discussions of goals of care   No orders of the defined types were placed in this encounter.  All questions were answered. The patient knows to call the clinic with any problems, questions or concerns. No barriers to learning was detected. I spent 60 minutes counseling the patient face to face. The total time spent in the appointment was 80 minutes and more than 50% was on counseling and review of test results     [name], MD 02/06/2017 4:28 PM

## 2017-02-06 NOTE — Assessment & Plan Note (Signed)
The mass under her chin represent necrotic tumor that is draining Continue aggressive wound care There is no surgical options and she will not benefit from radiation treatment

## 2017-02-06 NOTE — Assessment & Plan Note (Signed)
I have review imaging study and plan of care with the patient and family members extensively The patient have significant disease progression The tumor is bulky, causing difficulties closing her mouth, difficulties with clearance of oral secretion, worsening dysphagia, worsening pain and also drainage under her chin Her prognosis is very poor The patient is very debilitated after her last cycle of chemotherapy I estimated her ECOG performance status score at 3 At this point, I do not believe she would tolerate any further systemic treatment If she recovers somewhat, we can consider immunotherapy although I have suspicion it may not work well for her and might predispose her with severe side effects Ultimately, she is undecided Family members wants to continue aggressive supportive care through advanced home care I plan to bring her back again in 2 weeks for further discussion

## 2017-02-06 NOTE — Telephone Encounter (Signed)
Faxed office notes to [facility]

## 2017-02-06 NOTE — Assessment & Plan Note (Signed)
We have extensive goals of care discussion today The patient is frail She is dependent on daily IV fluids due to inability to eat She had severe side effects from prior chemo She had recurrent courses of aspiration pneumonia and is at high risk of recurrent aspiration Overall, her prognosis is very poor due to refractory state of her disease despite treatment The patient is not able to make medical decision today I have a feeling the patient and family members are not willing to accept palliative care or hospice Ultimately, I recommend aggressive wound care and pain management today I will bring her back again in 2 weeks for further assessment and further discussions of goals of care

## 2017-02-07 ENCOUNTER — Telehealth: Payer: Self-pay

## 2017-02-07 NOTE — Telephone Encounter (Signed)
Office notes from 01/28/17 and 02/05/17 and PET scan faxed to [facility] @ [phone].  There call back number is [phone]

## 2017-02-09 ENCOUNTER — Emergency Department (HOSPITAL_COMMUNITY)
Admission: EM | Admit: 2017-02-09 | Discharge: 2017-02-10 | Disposition: A | Discharge status: Home / Self Care | Payer: 59 | Attending: Emergency Medicine | Admitting: Emergency Medicine

## 2017-02-09 ENCOUNTER — Encounter (HOSPITAL_COMMUNITY): Payer: Self-pay | Admitting: Nurse Practitioner

## 2017-02-09 ENCOUNTER — Emergency Department (HOSPITAL_COMMUNITY): Payer: 59

## 2017-02-09 DIAGNOSIS — Z79899 Other long term (current) drug therapy: Secondary | ICD-10-CM | POA: Insufficient documentation

## 2017-02-09 DIAGNOSIS — E039 Hypothyroidism, unspecified: Secondary | ICD-10-CM | POA: Insufficient documentation

## 2017-02-09 DIAGNOSIS — N183 Chronic kidney disease, stage 3 (moderate): Secondary | ICD-10-CM | POA: Diagnosis not present

## 2017-02-09 DIAGNOSIS — R600 Localized edema: Secondary | ICD-10-CM | POA: Diagnosis present

## 2017-02-09 DIAGNOSIS — I129 Hypertensive chronic kidney disease with stage 1 through stage 4 chronic kidney disease, or unspecified chronic kidney disease: Secondary | ICD-10-CM | POA: Insufficient documentation

## 2017-02-09 DIAGNOSIS — Z96651 Presence of right artificial knee joint: Secondary | ICD-10-CM | POA: Diagnosis not present

## 2017-02-09 DIAGNOSIS — L0291 Cutaneous abscess, unspecified: Secondary | ICD-10-CM

## 2017-02-09 DIAGNOSIS — L0201 Cutaneous abscess of face: Secondary | ICD-10-CM | POA: Insufficient documentation

## 2017-02-09 DIAGNOSIS — R509 Fever, unspecified: Secondary | ICD-10-CM

## 2017-02-09 LAB — CBC WITH DIFFERENTIAL/PLATELET
Basophils Absolute: 0 10*3/uL (ref 0.0–0.1)
Basophils Relative: 0 %
EOS PCT: 0 %
Eosinophils Absolute: 0 10*3/uL (ref 0.0–0.7)
HEMATOCRIT: 27.9 % — AB (ref 36.0–46.0)
HEMOGLOBIN: 10.1 g/dL — AB (ref 12.0–15.0)
LYMPHS ABS: 0.6 10*3/uL — AB (ref 0.7–4.0)
Lymphocytes Relative: 5 %
MCH: 30 pg (ref 26.0–34.0)
MCHC: 36.2 g/dL — ABNORMAL HIGH (ref 30.0–36.0)
MCV: 82.8 fL (ref 78.0–100.0)
MONOS PCT: 5 %
Monocytes Absolute: 0.6 10*3/uL (ref 0.1–1.0)
NEUTROS ABS: 10.2 10*3/uL — AB (ref 1.7–7.7)
Neutrophils Relative %: 90 %
Platelets: 269 10*3/uL (ref 150–400)
RBC: 3.37 MIL/uL — AB (ref 3.87–5.11)
RDW: 19.2 % — AB (ref 11.5–15.5)
WBC: 11.4 10*3/uL — ABNORMAL HIGH (ref 4.0–10.5)

## 2017-02-09 LAB — URINALYSIS, ROUTINE W REFLEX MICROSCOPIC
Bilirubin Urine: NEGATIVE
Glucose, UA: NEGATIVE mg/dL
HGB URINE DIPSTICK: NEGATIVE
Ketones, ur: NEGATIVE mg/dL
LEUKOCYTES UA: NEGATIVE
Nitrite: NEGATIVE
Protein, ur: NEGATIVE mg/dL
SPECIFIC GRAVITY, URINE: 1.011 (ref 1.005–1.030)
pH: 6 (ref 5.0–8.0)

## 2017-02-09 LAB — INFLUENZA PANEL BY PCR (TYPE A & B)
Influenza A By PCR: NEGATIVE
Influenza B By PCR: NEGATIVE

## 2017-02-09 LAB — COMPREHENSIVE METABOLIC PANEL
ALBUMIN: 3 g/dL — AB (ref 3.5–5.0)
ALT: 25 U/L (ref 14–54)
AST: 32 U/L (ref 15–41)
Alkaline Phosphatase: 79 U/L (ref 38–126)
Anion gap: 8 (ref 5–15)
BUN: 15 mg/dL (ref 6–20)
CHLORIDE: 94 mmol/L — AB (ref 101–111)
CO2: 30 mmol/L (ref 22–32)
CREATININE: 0.84 mg/dL (ref 0.44–1.00)
Calcium: 9.3 mg/dL (ref 8.9–10.3)
GFR calc Af Amer: >60 mL/min (ref >60)
GFR calc non Af Amer: >60 mL/min (ref >60)
Glucose, Bld: 134 mg/dL — ABNORMAL HIGH (ref 65–99)
Potassium: 3.9 mmol/L (ref 3.5–5.1)
SODIUM: 132 mmol/L — AB (ref 135–145)
Total Bilirubin: 0.5 mg/dL (ref 0.3–1.2)
Total Protein: 7.3 g/dL (ref 6.5–8.1)

## 2017-02-09 LAB — I-STAT TROPONIN, ED: TROPONIN I, POC: 0.01 ng/mL (ref 0.00–0.08)

## 2017-02-09 LAB — I-STAT CG4 LACTIC ACID, ED
LACTIC ACID, VENOUS: 0.99 mmol/L (ref 0.5–1.9)
Lactic Acid, Venous: 1.33 mmol/L (ref 0.5–1.9)

## 2017-02-09 LAB — PROTIME-INR
INR: 1.19
Prothrombin Time: 15 seconds (ref 11.4–15.2)

## 2017-02-09 MED ORDER — VANCOMYCIN HCL 10 G IV SOLR
1500.0000 mg | Freq: Once | INTRAVENOUS | Status: AC
  Administered 2017-02-09: 1500 mg via INTRAVENOUS
  Filled 2017-02-09: qty 1500

## 2017-02-09 MED ORDER — SODIUM CHLORIDE 0.9 % IV BOLUS (SEPSIS)
1000.0000 mL | Freq: Once | INTRAVENOUS | Status: AC
  Administered 2017-02-09: 1000 mL via INTRAVENOUS

## 2017-02-09 MED ORDER — PIPERACILLIN-TAZOBACTAM 3.375 G IVPB 30 MIN
3.3750 g | INTRAVENOUS | Status: AC
  Administered 2017-02-09: 3.375 g via INTRAVENOUS
  Filled 2017-02-09: qty 50

## 2017-02-09 MED ORDER — HEPARIN SOD (PORK) LOCK FLUSH 100 UNIT/ML IV SOLN
500.0000 [IU] | Freq: Once | INTRAVENOUS | Status: AC
  Administered 2017-02-10: 500 [IU]
  Filled 2017-02-09: qty 5

## 2017-02-09 MED ORDER — DOXYCYCLINE HYCLATE 100 MG PO CAPS: 100.0000 mg | ORAL_CAPSULE | Freq: Two times a day (BID) | ORAL | 0 refills | Status: AC

## 2017-02-09 NOTE — ED Provider Notes (Addendum)
Medical screening examination/treatment/procedure(s) were conducted as a shared visit with non-physician practitioner(s) and myself.  I personally evaluated the patient during the encounter.   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  Home health care nurse identified fever to 101 today.  Patient has not had significant associated symptoms.  They do note increased urinary frequency.  Patient has had large bulky swelling of the lower face.  There has been an open and draining wound for a week.  They have been told by oncology there is nothing they can do with that except apply dressings to absorb drainage.  Patient is alert and interactive.  He does not have respiratory distress.  She has large asymmetric facial swelling of the right lower face.  There is an oozing mass to the submental area.  Because membranes are pink and moist.  No respiratory distress.  Heart is regular.  Borderline tachycardia.  Lungs grossly clear with an area of large airway upper chest field wheeze.  No calf tenderness.  Diagnostic workup does not suggest a source of infection.  Patient has been afebrile here in the emergency department.  She is at baseline.  Blood cultures are pending.  At this time agree with plan to discharge patient home with empiric antibiotics towards possible secondary infection of tumor site.  His drainage is malodorous.  Has been eval by oncology and deemed secondary to tumor burden.  Taking empiric antibiotics will be initiated with close follow-up or return to the emergency department with worsening or changing symptoms.   Charlesetta Shanks, MD 02/10/17 Rodney Booze    Charlesetta Shanks, MD 02/10/17 1740

## 2017-02-09 NOTE — ED Notes (Signed)
In and out catheter preformed by [facility] NT

## 2017-02-09 NOTE — ED Provider Notes (Signed)
[facility] DEPT Provider Note   CSN: [id number] Arrival date & time: 02/09/17  1445     History   Chief Complaint Chief Complaint  Patient presents with  . Fever  . CA-Pt    HPI Tiffany Yu is a 62 y.o. 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  When her home health nurse came today she had a temperature of 101.  She did not have any ibuprofen or Tylenol prior to arrival.  She does report occasional chest pain, and low back pain.  Her husband reports that she has been having increased urinary frequency.  She has difficulty talking secondary to buk of cancer and asks that her husband be allowed to answer questions for her.    No chills.  No increased shortness of breath.  She is at high risk of aspiration with multiple episodes of pneumonia over the past few months.   HPI  Past Medical History:  Diagnosis Date  . Anxiety   . Arthritis   . Depression    takes ZYban daily  . History of colon polyps    benign  . History of radiation therapy 03/15/16- 04/30/16   Tongue and bilateral neck 68 Gy in 33 fractions  . Hyperlipidemia    takes Atorvastatin daily  . Hypertension    takes HCTZ daily  . Hypomagnesemia 05/04/2016  . Hypothyroidism    takes Synthroid daily  . Insomnia    takes Ambien nightly and Xanax as needed  . Refusal of blood transfusions as patient is Jehovah's Witness   . squamous cell of the tongue dx'd 12/2015    Patient Active Problem List   Diagnosis Date Noted  . At risk for aspiration pneumonia 02/06/2017  . Mass of chin 01/29/2017  . Pneumonia 01/18/2017  . Dehydration 10/23/2016  . Deficiency anemia 10/16/2016  . Insomnia disorder 10/08/2016  . Acneiform drug eruption 09/11/2016  . CKD (chronic kidney disease), stage  III ([place]) 09/11/2016  . Physical debility 08/24/2016  . Dysphagia 08/03/2016  . Depression due to physical illness 06/12/2016  . Acquired lymphedema 05/11/2016  . Hypomagnesemia 05/04/2016  . Paronychia of right thumb 04/13/2016  . Pancytopenia, acquired ([place]) 04/12/2016  . Weight loss 03/08/2016  . Dysarthria 03/08/2016  . Goals of care, counseling/discussion 03/08/2016  . Cancer associated pain 03/07/2016  . 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[place]) 02/08/2016  . Tongue cancer ([place]) 02/08/2016  . 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 OSA on CPAP 01/14/2014  . Snoring 05/06/2013  . Chronic allergic rhinitis 05/06/2013  . Morbid obesity ([place]) 05/06/2013  . Fracture of femur, distal, right, closed ([place]) 02/29/2012  . Osteoarthritis of right knee 12/14/2011    Past Surgical History:  Procedure Laterality Date  . CHOLECYSTECTOMY    . COLONOSCOPY    . DILATION AND CURETTAGE OF UTERUS    . EXCISION OF TONGUE LESION Left 02/08/2016   Procedure: WIDE LOCAL EXCISION OF LEFT LATERAL TONGUE;  Surgeon: Jerrell Belfast, MD;  Location: [place];  Service: ENT;  Laterality: Left;  . FEMUR IM NAIL Right 02/28/2012   Procedure: OPEN REDUCTION INTERNAL FIXATION (ORIF) RIGHT KNEE;  Surgeon: Alta Corning, MD;  Location: [place] [facility];  Service: Orthopedics;  Laterality: Right;  . IR GENERIC HISTORICAL  03/15/2016   IR FLUORO  GUIDE PORT INSERTION RIGHT 03/15/2016 Sandi Mariscal, MD [facility]  . IR GENERIC HISTORICAL  03/15/2016   IR US GUIDE VASC ACCESS RIGHT 03/15/2016 Sandi Mariscal, MD [facility]  . IR GENERIC HISTORICAL  03/15/2016   IR GASTROSTOMY TUBE MOD SED 03/15/2016 [facility]  . KNEE ARTHROPLASTY  12/14/2011   Procedure: COMPUTER ASSISTED TOTAL KNEE ARTHROPLASTY;  Surgeon: Alta Corning, MD;  Location: [place];  Service: Orthopedics;  Laterality: Right;  . KNEE ARTHROSCOPY     Right  . PLANTAR FASCIA SURGERY     right foot  . RADICAL NECK DISSECTION Left 02/08/2016   Procedure: LEFT  SELECTIVE RADICAL NECK DISSECTION;  Surgeon: Jerrell Belfast, MD;  Location: [place];  Service: ENT;  Laterality: Left;  . TENDON RELEASE     right wrist  . TOTAL KNEE ARTHROPLASTY  12/14/2011   right knee    OB History    No data available       Home Medications    Prior to Admission medications   Medication Sig Start Date End Date Taking? Authorizing Provider  chlorhexidine (PERIDEX) 0.12 % solution Rinse with 15 mls three times daily for 30 seconds. Use after breakfast, dinner, and at bedtime. Spit out excess. Do not swallow. 02/28/16  Yes Lenn Cal, DDS  chlorpheniramine-HYDROcodone (TUSSIONEX) 10-8 MG/5ML SUER Place 5 mLs into feeding tube every 12 (twelve) hours as needed for cough. 01/17/17  Yes Gorsuch, Ni, MD  fentaNYL (DURAGESIC - DOSED MCG/HR) 25 MCG/HR patch Place 1 patch (25 mcg total) onto the skin every 3 (three) days. 02/05/17  Yes Gorsuch, Ni, MD  HYDROcodone-homatropine (HYCODAN) 5-1.5 MG/5ML syrup Place 5 mLs into feeding tube every 6 (six) hours as needed for cough. 01/28/17  Yes Gorsuch, Ni, MD  HYDROmorphone HCl (DILAUDID) 1 MG/ML LIQD Place 6 mLs (6 mg total) into feeding tube every 3 (three) hours as needed for moderate pain or severe pain. 02/05/17  Yes [name], MD  levothyroxine (SYNTHROID, LEVOTHROID) 75 MCG tablet Take 1 tablet (75 mcg total) by mouth daily before breakfast. 09/18/16  Yes Gorsuch, Ni, MD  lidocaine-prilocaine (EMLA) cream APPLY TO AFFECTED AREA ONCE AS DIRECTED 11/13/16  Yes Gorsuch, Ni, MD  LORazepam (ATIVAN) 1 MG tablet TAKE 1 TABLET BY MOUTH EVERY 8 HOURS 01/30/17  Yes [name], MD  Nutritional Supplements (PROMOD) LIQD Give 31ml of promod daily via feeding tube (Mix 1ml of promod and 65ml of water).  Flush feeding tube with 80ml of water before giving promod and after. 10/30/16  Yes Gorsuch, Ni, MD  OLANZapine (ZYPREXA) 5 MG tablet Place 1 tablet (5 mg total) into feeding tube at bedtime. 01/02/17  Yes Gorsuch, Ni, MD  polyethylene  glycol (MIRALAX / GLYCOLAX) packet Place 17 g into feeding tube daily. 06/13/16  Yes Gorsuch, Ni, MD  prochlorperazine (COMPAZINE) 10 MG tablet TAKE 1 TABLET BY MOUTH EVERY 6 HOURS AS NEEDED FOR NAUSEA AND VOMITING 01/02/17  Yes Gorsuch, Ni, MD  senna (SENOKOT) 8.6 MG tablet Take 1 tablet by mouth 2 (two) times daily.   Yes [provider]  Amino Acids-Protein Hydrolys (FEEDING SUPPLEMENT, PRO-STAT SUGAR FREE 64,) LIQD Place 30 mLs into feeding tube daily. 06/13/16   [name], MD  clindamycin (CLINDAGEL) 1 % gel Apply topically 2 (two) times daily. Patient not taking: Reported on 02/09/2017 09/11/16   [name], MD  doxycycline (VIBRAMYCIN) 100 MG capsule Take 1 capsule (100 mg total) by mouth 2 (two) times daily for 7 days. 02/09/17 02/16/17  Lorin Glass, PA-C  mirtazapine (REMERON SOL-TAB) 15 MG disintegrating tablet Take 1 tablet (15 mg total) by mouth at bedtime. Patient not taking: Reported on 02/09/2017 06/13/16   [name], MD  Nutritional Supplements (FEEDING SUPPLEMENT, VITAL 1.5 CAL,) LIQD D/C continuous feeding pump. Give Vital 1.5 or equivalent, 7 bottles daily, bolus feeding via PEG. Flush tube with 60 mL free water before and after bolus feeding QID. In addition, flush tube with 250 mL free water QID. Send formula and syringes. Pick up pump. 08/16/16   Eppie Gibson, MD  ondansetron (ZOFRAN ODT) 8 MG disintegrating tablet Take 1 tablet (8 mg total) by mouth every 8 (eight) hours as needed for nausea or vomiting. Patient not taking: Reported on 08/10/2016 04/16/16   Eppie Gibson, MD  ondansetron (ZOFRAN) 8 MG tablet TAKE 1 TABLET BY MOUTH 2 TIMES DAILY AS NEEDED FOR REFRACTORY NAUSEA / VOMITING. START ON DAY 3 AFTER CHEMO. Patient not taking: Reported on 02/09/2017 11/06/16   [name], MD  ondansetron (ZOFRAN) 8 MG tablet Take 1 tablet (8 mg total) by mouth every 8 (eight) hours as needed for nausea or vomiting. Patient not taking: Reported on 02/09/2017 01/03/17   Harle Stanford.,  PA-C    Family History Family History  Problem Relation Age of Onset  . Anemia Mother   . Hypertension Mother   . Heart attack Father     Social History Social History   Tobacco Use  . Smoking status: Never Smoker  . Smokeless tobacco: Never Used  Substance Use Topics  . Alcohol use: Yes    Comment: very rarely  . Drug use: No     Allergies   Patient has no known allergies.   Review of Systems Review of Systems  Constitutional: Positive for fatigue and fever. Negative for chills.  HENT: Negative for ear pain and sore throat.   Eyes: Negative for pain and visual disturbance.  Respiratory: Negative for cough and shortness of breath.   Cardiovascular: Negative for chest pain and palpitations.  Gastrointestinal: Negative for abdominal pain, diarrhea, nausea and vomiting.  Genitourinary: Positive for dysuria, frequency and urgency. Negative for hematuria.  Musculoskeletal: Positive for back pain and neck pain. Negative for arthralgias.  Skin: Positive for wound (Draining from neck/chin, not new. ). Negative for color change and rash.  Neurological: Negative for seizures, syncope and headaches.  Psychiatric/Behavioral: Positive for confusion (Husband reports she is more tired than usual, improved after removal of fentanyl patch).  All other systems reviewed and are negative.    Physical Exam Updated Vital Signs BP 127/64   Pulse (!) 101   Temp 99.5 F (37.5 C) (Oral)   Resp 17   Ht 5\' 8"  (1.727 m)   Wt 74.4 kg (164 lb)   SpO2 98%   BMI 24.94 kg/m   Physical Exam  Constitutional: She is oriented to person, place, and time. She appears well-developed and well-nourished. No distress.  HENT:  Head: Normocephalic and atraumatic.  Large swelling to neck/lower face. Tongue is moist.  Unable to visualize pharynx  Eyes: Conjunctivae are normal. No scleral icterus.  Cardiovascular: Normal rate, regular rhythm and intact distal pulses.  Murmur heard. Pulmonary/Chest:  Effort normal. No respiratory distress. She has rales (Diffusely). She exhibits no tenderness.  Abdominal: Soft. Bowel sounds are normal. There is no tenderness.  Feeding tube present.   Musculoskeletal: She exhibits no edema.  Neurological: She is alert and oriented to person, place, and time.  Skin: Skin is warm  and dry.  Psychiatric: She has a normal mood and affect. Her behavior is normal.  Nursing note and vitals reviewed.    ED Treatments / Results  Labs (all labs ordered are listed, but only abnormal results are displayed) Labs Reviewed  COMPREHENSIVE METABOLIC PANEL - Abnormal; Notable for the following components:      Result Value   Sodium 132 (*)    Chloride 94 (*)    Glucose, Bld 134 (*)    Albumin 3.0 (*)    All other components within normal limits  CBC WITH DIFFERENTIAL/PLATELET - Abnormal; Notable for the following components:   WBC 11.4 (*)    RBC 3.37 (*)    Hemoglobin 10.1 (*)    HCT 27.9 (*)    MCHC 36.2 (*)    RDW 19.2 (*)    Neutro Abs 10.2 (*)    Lymphs Abs 0.6 (*)    All other components within normal limits  AEROBIC CULTURE (SUPERFICIAL SPECIMEN)  CULTURE, BLOOD (ROUTINE X 2)  CULTURE, BLOOD (ROUTINE X 2)  CULTURE, BLOOD (ROUTINE X 2)  CULTURE, BLOOD (ROUTINE X 2)  URINE CULTURE  PROTIME-INR  URINALYSIS, ROUTINE W REFLEX MICROSCOPIC  INFLUENZA PANEL BY PCR (TYPE A & B)  I-STAT CG4 LACTIC ACID, ED  I-STAT TROPONIN, ED  I-STAT CG4 LACTIC ACID, ED    EKG  EKG Interpretation None     EKG obtained by Dr. Johnney Killian, please see her note.   Radiology Dg Chest 2 View  Result Date: 02/09/2017 CLINICAL DATA:  Fever and malaise.  Head neck carcinoma EXAM: CHEST  2 VIEW COMPARISON:  Chest radiograph January 03, 2017 and PET/CT January 28, 2017 FINDINGS: Port-A-Cath tip is at the cavoatrial junction. No pneumothorax. The patient's mandible obscures portions of the apices. There is patchy opacity in the right apex, stable compared to recent CT. No new  parenchymal lung opacity evident. Heart size and pulmonary vascularity are normal. No adenopathy. No bone lesions. IMPRESSION: Persistent opacity right upper lobe. Question pneumonia versus neoplasm. Both entities may exist concurrently. The appearance is stable compared to recent PET-CT examination. Note that the patient's mandible obscures a portion of the left apex. Lesion seen on recent PET-CT in the left apex may well continue to be present. No new opacity seen elsewhere. Stable cardiac silhouette. Port-A-Cath tip at cavoatrial junction without pneumothorax. Electronically Signed   By: Lowella Grip III M.D.   On: 02/09/2017 16:04    Procedures Procedures (including critical care time)  Medications Ordered in ED Medications  sodium chloride 0.9 % bolus 1,000 mL (0 mLs Intravenous Stopped 02/09/17 1816)  piperacillin-tazobactam (ZOSYN) IVPB 3.375 g (0 g Intravenous Stopped 02/09/17 1756)  vancomycin (VANCOCIN) 1,500 mg in sodium chloride 0.9 % 500 mL IVPB (0 mg Intravenous Stopped 02/10/17 0015)  heparin lock flush 100 unit/mL (500 Units Intracatheter Given 02/10/17 0021)     Initial Impression / Assessment and Plan / ED Course  I have reviewed the triage vital signs and the nursing notes.  Pertinent labs & imaging results that were available during my care of the patient were reviewed by me and considered in my medical decision making (see chart for details).  Clinical Course as of Feb 10 1502  Sat Feb 09, 2017  1544 Patient not in room.   [EH]  0347 Chart review shows that patient runs around 100-110 for HR with BP around 425 systolic.   [EH]  2050 Spoke with patient and family about plan.  They elect to  be treated with vancomycin here and go home with rx for antibiotics and follow up with oncology outpatient.    [EH]    Clinical Course User Index [EH] Lorin Glass, PA-C   [place] resents today for evaluation of recorded fever at home.  She is tachycardic, so initially  code sepsis was considered, however as chart review shows that she normally is tachycardic so cancelled.  She was initially given fluids and one dose of zosyn as she is at high risk of aspiration pneumonia.  CXR obtained without acute abnormalities.  Labs obtained and reviewed, white blood cells are mildly bumped at 11.4, PICC acid not elevated, she is afebrile here in the emergency room.  Influenza testing was negative, blood and urine cultures were obtained.  UA without obvious infection.  She has a wound on her chin that has been draining foul smelling material, suspect MRSA abscess/tumor necrosis.  I had a extensive conversation with the patient and her husband, regarding the potential options and treatment for this, including obtaining imaging, surgical consult for drainage, admission for IV antibiotics or trial of home antibiotics.  We discussed the potential risks, benefits and implications of the options and after discussion she made the informed decision to have a dose of vancomycin in the ED, and go home with doxycycline for MRSA coverage.  Vancomycin was given along with doxy rx.  She was given strict return precautions and instructions to follow up with oncology and stated her understanding.     At this time there does not appear to be any evidence of an acute emergency medical condition and the patient appears stable for discharge with appropriate outpatient follow up.Diagnosis was discussed with patient who verbalizes understanding and is agreeable to discharge. Pt case discussed with and seen by  Dr. Johnney Killian who agrees with my plan.    Final Clinical Impressions(s) / ED Diagnoses   Final diagnoses:  Fever, unspecified fever cause  Abscess    ED Discharge Orders        Ordered    doxycycline (VIBRAMYCIN) 100 MG capsule  2 times daily     02/09/17 2234       Lorin Glass, [place] 02/10/17 1504    Charlesetta Shanks, MD 02/10/17 [phone]

## 2017-02-09 NOTE — Progress Notes (Signed)
A consult was received from an ED physician for vancomycin per pharmacy dosing.  The patient's profile has been reviewed for ht/wt/allergies/indication/available labs.   A one time order has been placed for Vancomycin 1500 mg.  Further antibiotics/pharmacy consults should be ordered by admitting physician if indicated.                       Thank you, Dorrene German 02/09/2017  9:22 PM

## 2017-02-09 NOTE — Discharge Instructions (Signed)
I have given you a prescription for doxycycline to help treat the possible infection near your tumor.  We discussed the potential options for this, including obtaining imaging to get a better idea of the extent, admission for IV antibiotics, and other treatment options and he decided to have a dose of IV antibiotics here and go home with antibiotics.  It is important that you follow-up with your oncologist, please call them Monday.  If you develop worsening fevers, chills, nausea/vomiting, or feel worse and wish to have additional evaluation or wish to be admitted for IV antibiotics then please return to the emergency room for treatment.     You may have diarrhea from the antibiotics.  It is very important that you continue to take the antibiotics even if you get diarrhea unless a medical professional tells you that you may stop taking them.  If you stop too early the bacteria you are being treated for will become stronger and you may need different, more powerful antibiotics that have more side effects and worsening diarrhea.  Please stay well hydrated and consider probiotics as they may decrease the severity of your diarrhea.

## 2017-02-09 NOTE — ED Triage Notes (Signed)
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 Called cancer on call nurse and was advised to come for evaluation.

## 2017-02-11 ENCOUNTER — Telehealth: Payer: Self-pay | Admitting: *Deleted

## 2017-02-11 LAB — URINE CULTURE: CULTURE: NO GROWTH

## 2017-02-11 NOTE — Telephone Encounter (Signed)
Husband called. States they do want to continue IVF everyday. Palliative Care is coming on Tuesday for first visit.

## 2017-02-11 NOTE — Telephone Encounter (Signed)
Lesleigh Noe, RN with [facility] called with an update on Mrs Dike. Family has decided to go with Palliative Care. [place] socila worker will be going out to home to discuss. Dr Alvy Bimler wants to know if they want to continue daily IVF. If not, do they want to go to [facility]. RN will follow up and let us know.    Went to ED this weekend with fever 101.8- started antibiotics. Pt is incontinent and needs 24 hour care. Husband stopped fentanyl and is using dilaudid as needed.

## 2017-02-13 LAB — AEROBIC CULTURE W GRAM STAIN (SUPERFICIAL SPECIMEN)

## 2017-02-13 LAB — AEROBIC CULTURE  (SUPERFICIAL SPECIMEN)

## 2017-02-14 ENCOUNTER — Telehealth: Payer: Self-pay | Admitting: Emergency Medicine

## 2017-02-14 NOTE — Telephone Encounter (Signed)
Post ED Visit - Positive Culture Follow-up  Culture report reviewed by antimicrobial stewardship pharmacist:  [x]  Elenor Quinones, Pharm.D. []  Heide Guile, Pharm.D., BCPS AQ-ID []  Parks Neptune, Pharm.D., BCPS []  Alycia Rossetti, Pharm.D., BCPS []  [place], [place].D., BCPS, AAHIVP []  Legrand Como, Pharm.D., BCPS, AAHIVP []  Salome Arnt, PharmD, BCPS []  Jalene Mullet, PharmD []  Vincenza Hews, PharmD, BCPS  Positive wound culture Treated with doxcycline, organism sensitive to the same and no further patient follow-up is required at this time.  Hazle Nordmann 02/14/2017, 1:05 PM

## 2017-02-15 LAB — CULTURE, BLOOD (ROUTINE X 2)
Culture: NO GROWTH
Culture: NO GROWTH
Special Requests: ADEQUATE

## 2017-02-18 ENCOUNTER — Telehealth: Payer: Self-pay | Admitting: *Deleted

## 2017-02-18 NOTE — Telephone Encounter (Addendum)
Received vm call from husband, Marya Amsler asking for return call. Reached pt's husband this pm & he states they are discussing Hospice & [facility] SW coming out tomorrow.  He wants to know if they need to keep appt tomorrow with Dr Alvy Bimler.  Informed that it would be OK to cancel & if they change their mind, we can r/s.  He reports that the Fentanyl 25 micrograms was too much for the pt.  They tried it for 3 days & she was a zombie.  He states that they are using the dilaudid prn.  She sometimes goes as much as every 12 hours without it but may take every 6 hours.  They are giving it to her when she states that her head hurts.  He is asking if we can try the 12.5 mg fentanyl patch instead for her pain. Message to Dr Alvy Bimler. Discussed with Dr Alvy Bimler & she states that they were still giving IVF to pt & not sure they are willing to stop everything to become a Hospice pt.  If they definitely want hospice & are willing to stop IVF, she will right for script & cancel appt but if not Dr Alvy Bimler will need to see every 30 days for scripts.  Called & spoke to husband again & He will talk with SW tomorrow @ 12 noon & keep appt for now with Dr Alvy Bimler to discuss decisions.

## 2017-02-19 ENCOUNTER — Telehealth: Payer: Self-pay | Admitting: Hematology and Oncology

## 2017-02-19 ENCOUNTER — Inpatient Hospital Stay (HOSPITAL_BASED_OUTPATIENT_CLINIC_OR_DEPARTMENT_OTHER): Payer: 59 | Admitting: Hematology and Oncology

## 2017-02-19 ENCOUNTER — Encounter: Payer: Self-pay | Admitting: Hematology and Oncology

## 2017-02-19 DIAGNOSIS — Z7189 Other specified counseling: Secondary | ICD-10-CM

## 2017-02-19 DIAGNOSIS — C023 Malignant neoplasm of anterior two-thirds of tongue, part unspecified: Secondary | ICD-10-CM | POA: Diagnosis not present

## 2017-02-19 DIAGNOSIS — Z9189 Other specified personal risk factors, not elsewhere classified: Secondary | ICD-10-CM | POA: Diagnosis not present

## 2017-02-19 DIAGNOSIS — G893 Neoplasm related pain (acute) (chronic): Secondary | ICD-10-CM | POA: Diagnosis not present

## 2017-02-19 MED ORDER — FENTANYL 12 MCG/HR TD PT72: 12.0000 ug | MEDICATED_PATCH | TRANSDERMAL | 0 refills | Status: DC

## 2017-02-19 MED ORDER — HYDROMORPHONE HCL 1 MG/ML PO LIQD: 6.0000 mg | ORAL | 0 refills | Status: AC | PRN

## 2017-02-19 MED FILL — HYDROMORPHONE 5 MG/5 ML SOL: 1 | 10 days supply | Qty: 473 | Fill #0

## 2017-02-19 MED FILL — fentaNYL 12 MCG/HR PT72: 12 | 15 days supply | Qty: 5 | Fill #0

## 2017-02-19 NOTE — Assessment & Plan Note (Signed)
She is at very high risk of recurrent aspiration pneumonia She has been treated back to back with 2 courses of antibiotics recently On exam today, she appears to be able to clear her secretions better Unfortunately, I have no good option. She has significant disease progression that causes oropharyngeal narrowing and difficulties with clearing secretions She will probably need chronic antibiotic therapy in the future We will reassess closely

## 2017-02-19 NOTE — Assessment & Plan Note (Signed)
The patient is noted to have excessive sedation with 25 mcg of fentanyl patch I recommend trial of 12 mcg patch along with Dilaudid as needed I refilled her prescriptions today

## 2017-02-19 NOTE — Progress Notes (Signed)
[place] OFFICE PROGRESS NOTE  Patient Care Team: Bernerd Limbo, MD as PCP - General (Family Medicine) Jerrell Belfast, MD as Consulting Physician (Otolaryngology) Eppie Gibson, MD as Attending Physician (Radiation Oncology) [name], MD as Consulting Physician (Hematology and Oncology) Leota Sauers, RN as Oncology Nurse Navigator (Oncology)  ASSESSMENT & PLAN:  Cancer of anterior two-thirds of tongue [facility]) I do not appreciate further growth of the tumor on exam today Continue supportive care  At risk for aspiration pneumonia She is at very high risk of recurrent aspiration pneumonia She has been treated back to back with 2 courses of antibiotics recently On exam today, she appears to be able to clear her secretions better Unfortunately, I have no good option. She has significant disease progression that causes oropharyngeal narrowing and difficulties with clearing secretions She will probably need chronic antibiotic therapy in the future We will reassess closely  Cancer associated pain The patient is noted to have excessive sedation with 25 mcg of fentanyl patch I recommend trial of 12 mcg patch along with Dilaudid as needed I refilled her prescriptions today  Goals of care, counseling/discussion We have extensive goals of care discussion today The patient is frail She is coping well so far without IV fluids for 3 days She had severe side effects from prior chemo and we are in agreement to not proceed with further palliative chemotherapy She had recurrent courses of aspiration pneumonia and is at high risk of recurrent aspiration Overall, her prognosis is very poor due to refractory state of her disease despite treatment We have extensive discussion regarding the spectrum of care, from aggressive supportive care to palliative care/hospice After extensive discussion, the patient and her husband appears to be more comfortable to remain under the care of  advanced home care I will reinitiate advanced home care visit every week for patient assessment and to administer IV fluids as needed I will bring her back in 2 weeks for further supportive care and medication adjustment   No orders of the defined types were placed in this encounter.   INTERVAL HISTORY: Please see below for problem oriented charting. She returns with her husband for further follow-up She has discontinued IV fluids since 02/16/2017 She has met with social worker and palliative care service recently Her husband noted that the patient had excessive sedation with 25 mcg patch He has been giving her only Dilaudid recently due to concern for excessive sedation She continues to have regular serosanguineous wound discharge from under her chin She appears to be clearing his secretion better No recent fever or chills Cough has been stable There were no reported recent nausea, vomiting or constipation She has lost some weight  SUMMARY OF ONCOLOGIC HISTORY:   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[place])   02/08/2016 Pathology Results    Diagnosis 1. 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 Tongue, biopsy, Anterior deep margin left SQUAMOUS CELL CARCINOMA 3. Tongue, biopsy, Posterior deep margin left NEGATIVE FOR CARCINOMA 4. Tongue, biopsy, Posterior margin left SQUAMOUS CELL CARCINOMA 5. Tongue, excisional biopsy, New anterior deep margin NEGATIVE FOR CARCINOMA 6. Tongue, excisional biopsy, New posterior deep margin SMALL FOCUS OF SQUAMOUS CELL CARCINOMA (0.1 CM, ONLY PRESENT AT THE DEEPER PERMINENT SECTION) 7. Tongue, resection for tumor, Left lateral INFILTRATIVE  KERATINIZED SQUAMOUS CELL CARCINOMA (3.2 CM) THE TUMOR INVADES UNDERNEATH  MUSCLE OF THE TONGUE (2.0 CM, PT2) LYMPHOVASCULAR AND PERINEURAL INVASION IDENTIFIED SQUAMOUS CELL CARCINOMA PRESENTED AT (FINAL MARGINS REFER TO PART 2-6) LATERAL INKED MARGIN (1.0 CM) MEDIAL INKED MARGIN (0.6 CM)      02/08/2016 Surgery    SURGICAL PROCEDURES: 1. Left hemiglossectomy with primary closure. 2. Left selective neck dissection (zones I-III).      03/02/2016 Imaging    CT chest with contrast: No evidence of metastatic disease in the chest. 2. Aortic atherosclerosis.       03/02/2016 Imaging    CT neck with contrast showed : Newly enlarged and round 10 mm right level 1b lymph node (series 5, image 45). The level 1A nodes remain normal. This right 1 B node is nonspecific but suspicious for progressive nodal metastatic disease. It might be amenable to Ultrasound-guided FNA. 2. Interval left hemiglossectomy and selective left neck dissection with confluent indeterminate soft tissue from the anterior left submandibular space through the left carotid space and obscuring the left level 1B, level 2, and level IIIa nodal stations. This study will serve as a new postoperative baseline of this area. Attention directed on close interval follow-up. 3. Mild soft tissue thickening also along the left lateral tongue resection margin. Attention directed on followup. 4. Chest CT today reported separately      03/14/2016 Procedure    She has normal baseline hearing test      03/15/2016 Procedure    1. Successful placement of a right internal jugular approach power injectable Port-A-Cath. The Port a catheter is ready for immediate use. 2. Successful fluoroscopic insertion of a 20-French pull-through gastrostomy tube. The gastrostomy may be used immediately for medication administration and may be utilized in 24 hrs for the initiation of feeds.      03/15/2016 - 04/30/2016 Radiation Therapy    Tongue and Bilateral Neck treated to 68 Gy in 33 fractions      03/16/2016 - 04/27/2016 Chemotherapy     She received weekly cisplatin. Cycle 2-6 reduced dose at 20 mg/m2 due to significant side-effects and poor tolerance       04/04/2016 Adverse Reaction    Chemotherapy is placed on hold due to acute renal failure      04/13/2016 Adverse Reaction    Chemo was placed on hold due to shingles outbreak and paronychia      05/29/2016 - 06/04/2016 Hospital Admission    She was admitted to the hospital for management of nausea, vomiting and inability to tolerate tube feeding      06/06/2016 - 06/13/2016 Hospital Admission    She was admitted to the hospital for management of nausea, vomiting and inability to tolerate tube feeding      08/09/2016 Imaging    1. New bilateral upper lobe ground-glass nodules. Favor infectious or inflammatory process. Recommend close attention on routine follow-up. 2. Angular nodule RIGHT upper lobe is also favored infectious or inflammatory. Recommend attention on follow-up as above. 3. No mediastinal or supraclavicular lymphadenopathy. 4. Small RIGHT thyroid nodule does not meet biopsy criteria. Please see separate CT neck dictation.      08/09/2016 Imaging    1. Progression of disease with new/markedly worsened mass of the left base of tongue measuring 2.9 x 2.2 x 2.3 centimeters. Persistent soft tissue thickening of the postoperative left neck extending from the angle of the mandible to the level of the hyoid bone. 2. Confluent, centrally necrotic mass in the right submental  region, at the site of previously seen enlarged level 1B lymph node. This mass extends into the floor of the mouth and displaces or invades the right sublingual gland and the right mylohyoid and hyoglossus muscles. 3. Centrally necrotic right level IIa lymph node is new from the prior study and consistent with metastatic spread. No other lower cervical lymphadenopathy. 4. Multiple ground-glass opacities in the lung apices. Please see dedicated report for concomitant chest CT for more complete  characterization. 5. Thickening of the epiglottis and aryepiglottic folds is likely secondary to prior radiation treatment. No acute airway compromise.      08/22/2016 PET scan    1. The recently described progressive cervical disease is markedly hypermetabolic. This includes local recurrence in the left base of tongue and a confluent right submental nodal mass. In addition, there are small hypermetabolic lymph nodes on the right in station IIb and the anterior to the lower trachea.  2. No typical metastatic disease within the chest, abdomen or pelvis. 3. The recently described ground-glass opacities in both upper lobes have enlarged, become more dense and are associated with moderate hypermetabolic activity. These are new from February and probably inflammatory/infectious. CT follow-up after appropriate therapy recommended.      08/27/2016 - 11/20/2016 Chemotherapy    She received palliative chemo with carboplatin/5FU and cetuximab      09/11/2016 Adverse Reaction    Dose of cetuximab is reduced due to skin toxicity      10/08/2016 PET scan    1. Decrease in metabolic activity of LEFT base of tongue lesion. 2. Decrease in size and metabolic activity of right submental adenopathy/malignancy 3. Reduction in metabolic activity of RIGHT level 2 lymph node. 4. No evidence of metastatic progression chest, abdomen, pelvis. 5. Persistent bilateral upper lobe foci of consolidation. Favored inflammatory or infectious etiology and consider adverse drug reaction.      01/28/2017 PET scan    Progression of tumor involving the left oropharynx and right floor of the mouth.  Two cervical nodes anterior to the right sternocleidomastoid muscle, new.  Multifocal patchy/nodular opacities in the bilateral upper lobes, indeterminate. However, there has been progression in the posterior right upper lobe when compared to the prior, raising the possibility of metastatic disease.  Hypermetabolic node in the  right paratracheal region, nodal metastasis not excluded.       REVIEW OF SYSTEMS:   Constitutional: Denies fevers, chills  Eyes: Denies blurriness of vision Ears, nose, mouth, throat, and face: Denies mucositis or sore throat Cardiovascular: Denies palpitation, chest discomfort or lower extremity swelling Gastrointestinal:  Denies nausea, heartburn or change in bowel habits Skin: Denies abnormal skin rashes Lymphatics: Denies new lymphadenopathy or easy bruising Neurological:Denies numbness, tingling or new weaknesses Behavioral/Psych: Mood is stable, no new changes  All other systems were reviewed with the patient and are negative.  I have reviewed the past medical history, past surgical history, social history and family history with the patient and they are unchanged from previous note.  ALLERGIES:  has No Known Allergies.  MEDICATIONS:  Current Outpatient Medications  Medication Sig Dispense Refill  . Amino Acids-Protein Hydrolys (FEEDING SUPPLEMENT, PRO-STAT SUGAR FREE 64,) LIQD Place 30 mLs into feeding tube daily. 900 mL 0  . chlorhexidine (PERIDEX) 0.12 % solution Rinse with 15 mls three times daily for 30 seconds. Use after breakfast, dinner, and at bedtime. Spit out excess. Do not swallow. 480 mL prn  . chlorpheniramine-HYDROcodone (TUSSIONEX) 10-8 MG/5ML SUER Place 5 mLs into feeding tube every 12 (  twelve) hours as needed for cough. 473 mL 0  . clindamycin (CLINDAGEL) 1 % gel Apply topically 2 (two) times daily. (Patient not taking: Reported on 02/09/2017) 30 g 0  . fentaNYL (DURAGESIC - DOSED MCG/HR) 12 MCG/HR Place 1 patch (12.5 mcg total) onto the skin every 3 (three) days. 5 patch 0  . HYDROcodone-homatropine (HYCODAN) 5-1.5 MG/5ML syrup Place 5 mLs into feeding tube every 6 (six) hours as needed for cough. 240 mL 0  . HYDROmorphone HCl (DILAUDID) 1 MG/ML LIQD Place 6 mLs (6 mg total) into feeding tube every 3 (three) hours as needed for moderate pain or severe pain. 473  mL 0  . levothyroxine (SYNTHROID, LEVOTHROID) 75 MCG tablet Take 1 tablet (75 mcg total) by mouth daily before breakfast. 30 tablet 9  . lidocaine-prilocaine (EMLA) cream APPLY TO AFFECTED AREA ONCE AS DIRECTED 30 g 3  . LORazepam (ATIVAN) 1 MG tablet TAKE 1 TABLET BY MOUTH EVERY 8 HOURS 60 tablet 0  . mirtazapine (REMERON SOL-TAB) 15 MG disintegrating tablet Take 1 tablet (15 mg total) by mouth at bedtime. (Patient not taking: Reported on 02/09/2017) 30 tablet 9  . Nutritional Supplements (FEEDING SUPPLEMENT, VITAL 1.5 CAL,) LIQD D/C continuous feeding pump. Give Vital 1.5 or equivalent, 7 bottles daily, bolus feeding via PEG. Flush tube with 60 mL free water before and after bolus feeding QID. In addition, flush tube with 250 mL free water QID. Send formula and syringes. Pick up pump. 7 Can 4  . Nutritional Supplements (PROMOD) LIQD Give 43m of promod daily via feeding tube (Mix 386mof promod and 3084mf water).  Flush feeding tube with 40m13m water before giving promod and after. 946 mL 12  . OLANZapine (ZYPREXA) 5 MG tablet Place 1 tablet (5 mg total) into feeding tube at bedtime. 30 tablet 9  . ondansetron (ZOFRAN ODT) 8 MG disintegrating tablet Take 1 tablet (8 mg total) by mouth every 8 (eight) hours as needed for nausea or vomiting. (Patient not taking: Reported on 08/10/2016) 30 tablet 5  . ondansetron (ZOFRAN) 8 MG tablet TAKE 1 TABLET BY MOUTH 2 TIMES DAILY AS NEEDED FOR REFRACTORY NAUSEA / VOMITING. START ON DAY 3 AFTER CHEMO. (Patient not taking: Reported on 02/09/2017) 90 tablet 3  . ondansetron (ZOFRAN) 8 MG tablet Take 1 tablet (8 mg total) by mouth every 8 (eight) hours as needed for nausea or vomiting. (Patient not taking: Reported on 02/09/2017) 20 tablet 2  . polyethylene glycol (MIRALAX / GLYCOLAX) packet Place 17 g into feeding tube daily. 14 each 0  . prochlorperazine (COMPAZINE) 10 MG tablet TAKE 1 TABLET BY MOUTH EVERY 6 HOURS AS NEEDED FOR NAUSEA AND VOMITING 30 tablet 1  . senna  (SENOKOT) 8.6 MG tablet Take 1 tablet by mouth 2 (two) times daily.     No current facility-administered medications for this visit.    Facility-Administered Medications Ordered in Other Visits  Medication Dose Route Frequency Provider Last Rate Last Dose  . heparin lock flush 100 unit/mL  500 Units Intracatheter Once PRN GorsAlvy Bimler, MD      . sodium chloride flush (NS) 0.9 % injection 10 mL  10 mL Intracatheter PRN GorsAlvy Bimler, MD        PHYSICAL EXAMINATION: ECOG PERFORMANCE STATUS: 3 - Symptomatic, >50% confined to bed  Vitals:   02/19/17 1407  BP: 118/74  Pulse: (!) 111  Resp: 18  Temp: 98.6 F (37 C)  SpO2: 99%   Filed Weights   02/19/17  1407  Weight: 159 lb 8 oz (72.3 kg)    GENERAL:alert, no distress and comfortable SKIN: skin color, texture, turgor are normal, no rashes or significant lesions EYES: normal, Conjunctiva are pink and non-injected, sclera clear OROPHARYNX: She had signs of tongue resection.  She cannot close her mouth well.  She has a bandage under her chin NECK: Noted tissue hardening from prior radiation LYMPH:  no palpable lymphadenopathy in the cervical, axillary or inguinal LUNGS: She has mild increased breathing effort with diffuse expiratory wheezes HEART: regular rate & rhythm and no murmurs and no lower extremity edema ABDOMEN:abdomen soft, non-tender and normal bowel sounds.  Feeding tube site looks okay Musculoskeletal:no cyanosis of digits and no clubbing  NEURO: alert & oriented x 3 with fluent speech, no focal motor/sensory deficits  LABORATORY DATA:  I have reviewed the data as listed    Component Value Date/Time   NA 132 (L) 02/09/2017 1636   NA 141 01/02/2017 1214   K 3.9 02/09/2017 1636   K 3.7 01/02/2017 1214   CL 94 (L) 02/09/2017 1636   CO2 30 02/09/2017 1636   CO2 27 01/02/2017 1214   GLUCOSE 134 (H) 02/09/2017 1636   GLUCOSE 106 01/02/2017 1214   BUN 15 02/09/2017 1636   BUN 17.4 01/02/2017 1214   CREATININE 0.84  02/09/2017 1636   CREATININE 1.1 01/02/2017 1214   CALCIUM 9.3 02/09/2017 1636   CALCIUM 9.6 01/02/2017 1214   PROT 7.3 02/09/2017 1636   PROT 8.4 (H) 01/02/2017 1214   ALBUMIN 3.0 (L) 02/09/2017 1636   ALBUMIN 3.9 01/02/2017 1214   AST 32 02/09/2017 1636   AST 35 (H) 01/02/2017 1214   ALT 25 02/09/2017 1636   ALT 21 01/02/2017 1214   ALKPHOS 79 02/09/2017 1636   ALKPHOS 91 01/02/2017 1214   BILITOT 0.5 02/09/2017 1636   BILITOT 0.33 01/02/2017 1214   GFRNONAA >60 02/09/2017 1636   GFRAA >60 02/09/2017 1636    No results found for: SPEP, UPEP  Lab Results  Component Value Date   WBC 11.4 (H) 02/09/2017   NEUTROABS 10.2 (H) 02/09/2017   HGB 10.1 (L) 02/09/2017   HCT 27.9 (L) 02/09/2017   MCV 82.8 02/09/2017   PLT 269 02/09/2017      Chemistry      Component Value Date/Time   NA 132 (L) 02/09/2017 1636   NA 141 01/02/2017 1214   K 3.9 02/09/2017 1636   K 3.7 01/02/2017 1214   CL 94 (L) 02/09/2017 1636   CO2 30 02/09/2017 1636   CO2 27 01/02/2017 1214   BUN 15 02/09/2017 1636   BUN 17.4 01/02/2017 1214   CREATININE 0.84 02/09/2017 1636   CREATININE 1.1 01/02/2017 1214      Component Value Date/Time   CALCIUM 9.3 02/09/2017 1636   CALCIUM 9.6 01/02/2017 1214   ALKPHOS 79 02/09/2017 1636   ALKPHOS 91 01/02/2017 1214   AST 32 02/09/2017 1636   AST 35 (H) 01/02/2017 1214   ALT 25 02/09/2017 1636   ALT 21 01/02/2017 1214   BILITOT 0.5 02/09/2017 1636   BILITOT 0.33 01/02/2017 1214       RADIOGRAPHIC STUDIES: I have personally reviewed the radiological images as listed and agreed with the findings in the report. Dg Chest 2 View  Result Date: 02/09/2017 CLINICAL DATA:  Fever and malaise.  Head neck carcinoma EXAM: CHEST  2 VIEW COMPARISON:  Chest radiograph January 03, 2017 and PET/CT January 28, 2017 FINDINGS: Port-A-Cath tip is at  the cavoatrial junction. No pneumothorax. The patient's mandible obscures portions of the apices. There is patchy opacity in the  right apex, stable compared to recent CT. No new parenchymal lung opacity evident. Heart size and pulmonary vascularity are normal. No adenopathy. No bone lesions. IMPRESSION: Persistent opacity right upper lobe. Question pneumonia versus neoplasm. Both entities may exist concurrently. The appearance is stable compared to recent PET-CT examination. Note that the patient's mandible obscures a portion of the left apex. Lesion seen on recent PET-CT in the left apex may well continue to be present. No new opacity seen elsewhere. Stable cardiac silhouette. Port-A-Cath tip at cavoatrial junction without pneumothorax. Electronically Signed   By: Lowella Grip III M.D.   On: 02/09/2017 16:04   Nm Pet Image Restag (ps) Skull Base To Thigh  Result Date: 01/28/2017 CLINICAL DATA:  Subsequent treatment strategy for left tongue cancer status post hemiglossectomy. EXAM: NUCLEAR MEDICINE PET SKULL BASE TO THIGH TECHNIQUE: 11.16 mCi F-18 FDG was injected intravenously. Full-ring PET imaging was performed from the skull base to thigh after the radiotracer. CT data was obtained and used for attenuation correction and anatomic localization. FASTING BLOOD GLUCOSE:  Value: 102 mg/dl COMPARISON:  PET-CT dated 10/08/2016 FINDINGS: NECK: 3.6 x 2.4 cm hypermetabolic mass along the left oropharynx, along the posterior aspect of the left surgical margin, max SUV 10.9, likely reflecting local recurrence. Previously, this measured 2.9 x 1.8 cm with max SUV 7.4. 4.4 x 6.2 cm hypermetabolic mass along the right floor of the mouth (series 3/image 51), with suspected involvement of the right mandible, max SUV 13.0. Previously, this measured 2.8 x 3.8 cm with max SUV 6.7. Two lymph nodes anterior to the right sternocleidomastoid muscle measuring 8-10 mm (series 3/images 54 and 57), max SUV 7.6, new. CHEST: Multifocal patchy/nodular opacities in the bilateral upper lobes. 4.0 cm patchy opacity in the anterior right lung apex (series 3/image  6.3) with max SUV 3.6, previously a more confluent 2.7 cm opacity with max SUV 4.1. Patchy left upper lobe opacities including a 1.8 cm subsolid anterior left upper lobe opacity (series 3/image 65) with max SUV 3.2, previously a more solid nodular opacity measuring 1.8 cm with max SUV 2.7. Patchy/nodular posterior right upper lobe opacity measuring 2.0 cm (series 3/image 69), new/increased, max SUV 4.3. Hypermetabolism in the right paratracheal region, max SUV 6.7, new. Mild cardiomegaly. Trace pericardial effusion. No evidence of thoracic aortic aneurysm. Mild atherosclerotic calcifications of the aortic arch. Right chest port terminates at the cavoatrial junction. ABDOMEN/PELVIS: No abnormal hypermetabolism in the liver, spleen, pancreas, or bilateral adrenal glands. No hypermetabolic lymphadenopathy in the abdomen/pelvis. Status post cholecystectomy. Gastrostomy in satisfactory position. Sigmoid diverticulosis, without evidence of diverticulitis. SKELETON: No focal hypermetabolic activity to suggest skeletal metastasis. IMPRESSION: Progression of tumor involving the left oropharynx and right floor of the mouth. Two cervical nodes anterior to the right sternocleidomastoid muscle, new. Multifocal patchy/nodular opacities in the bilateral upper lobes, indeterminate. However, there has been progression in the posterior right upper lobe when compared to the prior, raising the possibility of metastatic disease. Hypermetabolic node in the right paratracheal region, nodal metastasis not excluded. Electronically Signed   By: Julian Hy M.D.   On: 01/28/2017 13:12    All questions were answered. The patient knows to call the clinic with any problems, questions or concerns. No barriers to learning was detected.  I spent 40 minutes counseling the patient face to face. The total time spent in the appointment was 55 minutes and more  than 50% was on counseling and review of test results  [name], MD 02/19/2017  4:23 PM

## 2017-02-19 NOTE — Telephone Encounter (Signed)
Gave avs and calendar for march °

## 2017-02-19 NOTE — Assessment & Plan Note (Signed)
I do not appreciate further growth of the tumor on exam today Continue supportive care

## 2017-02-19 NOTE — Assessment & Plan Note (Signed)
We have extensive goals of care discussion today The patient is frail She is coping well so far without IV fluids for 3 days She had severe side effects from prior chemo and we are in agreement to not proceed with further palliative chemotherapy She had recurrent courses of aspiration pneumonia and is at high risk of recurrent aspiration Overall, her prognosis is very poor due to refractory state of her disease despite treatment We have extensive discussion regarding the spectrum of care, from aggressive supportive care to palliative care/hospice After extensive discussion, the patient and her husband appears to be more comfortable to remain under the care of advanced home care I will reinitiate advanced home care visit every week for patient assessment and to administer IV fluids as needed I will bring her back in 2 weeks for further supportive care and medication adjustment

## 2017-02-23 ENCOUNTER — Other Ambulatory Visit: Payer: Self-pay | Admitting: Hematology and Oncology

## 2017-02-25 NOTE — Telephone Encounter (Signed)
Not sure if she needs refill or not

## 2017-02-26 MED FILL — OLANZapine 5 MG TABS: 5 | 30 days supply | Qty: 30 | Fill #2

## 2017-03-04 ENCOUNTER — Encounter: Payer: Self-pay | Admitting: Hematology and Oncology

## 2017-03-04 ENCOUNTER — Inpatient Hospital Stay: Payer: 59

## 2017-03-04 ENCOUNTER — Telehealth: Payer: Self-pay | Admitting: Hematology and Oncology

## 2017-03-04 ENCOUNTER — Inpatient Hospital Stay: Payer: 59 | Attending: Hematology and Oncology | Admitting: Hematology and Oncology

## 2017-03-04 VITALS — BP 124/58 | HR 107 | Temp 99.0°F | Resp 18 | Ht 68.0 in | Wt 173.4 lb

## 2017-03-04 DIAGNOSIS — F331 Major depressive disorder, recurrent, moderate: Secondary | ICD-10-CM

## 2017-03-04 DIAGNOSIS — G893 Neoplasm related pain (acute) (chronic): Secondary | ICD-10-CM

## 2017-03-04 DIAGNOSIS — G4700 Insomnia, unspecified: Secondary | ICD-10-CM

## 2017-03-04 DIAGNOSIS — E860 Dehydration: Secondary | ICD-10-CM

## 2017-03-04 DIAGNOSIS — R300 Dysuria: Secondary | ICD-10-CM | POA: Diagnosis not present

## 2017-03-04 DIAGNOSIS — F409 Phobic anxiety disorder, unspecified: Secondary | ICD-10-CM | POA: Insufficient documentation

## 2017-03-04 DIAGNOSIS — C023 Malignant neoplasm of anterior two-thirds of tongue, part unspecified: Secondary | ICD-10-CM

## 2017-03-04 DIAGNOSIS — F5105 Insomnia due to other mental disorder: Secondary | ICD-10-CM

## 2017-03-04 LAB — URINALYSIS, COMPLETE (UACMP) WITH MICROSCOPIC
BILIRUBIN URINE: NEGATIVE
Glucose, UA: NEGATIVE mg/dL
Hgb urine dipstick: NEGATIVE
Ketones, ur: NEGATIVE mg/dL
Leukocytes, UA: NEGATIVE
Nitrite: NEGATIVE
PH: 8 (ref 5.0–8.0)
Protein, ur: NEGATIVE mg/dL
SPECIFIC GRAVITY, URINE: 1.004 — AB (ref 1.005–1.030)

## 2017-03-04 MED ORDER — ZOLPIDEM TARTRATE 10 MG PO TABS: 10.0000 mg | ORAL_TABLET | Freq: Every evening | ORAL | 5 refills | Status: AC | PRN

## 2017-03-04 MED ORDER — OLANZAPINE 10 MG PO TABS
10.0000 mg | ORAL_TABLET | Freq: Every day | ORAL | 9 refills | Status: AC
Start: 2017-03-04 — End: ?

## 2017-03-04 MED ORDER — METHADONE HCL 10 MG/5ML PO SOLN: 10.0000 mg | Freq: Three times a day (TID) | ORAL | 0 refills | Status: AC

## 2017-03-04 MED FILL — METHADONE 10 MG/5 ML SOLUTI: 10 | 30 days supply | Qty: 450 | Fill #0

## 2017-03-04 MED FILL — ZOLPIDEM TARTRATE 10 MG TAB: 10 | 30 days supply | Qty: 30 | Fill #0

## 2017-03-04 NOTE — Telephone Encounter (Signed)
Gave avs and calendar for april °

## 2017-03-04 NOTE — Assessment & Plan Note (Signed)
There is minimal growth of the tumor on exam today Continue supportive care

## 2017-03-04 NOTE — Progress Notes (Signed)
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[place]) There is minimal growth of the tumor on exam today Continue supportive care  Cancer associated pain She has poorly controlled pain She did not tolerate fentanyl patch I recommend trial of liquid methadone to be used in conjunction with liquid Dilaudid as needed I will reassess pain control in her next visit I warned her against the risk of nausea, constipation and sedation  Dehydration The patient is prone to get dehydrated She is being assessed on a weekly basis through advanced home care agency and has been receiving IV fluids daily as needed  Insomnia due to anxiety and fear I have refilled her prescription Ambien She is also allowed to try mildly increased dose of olanzapine as needed  Dysuria She had recent symptoms of dysuria I will check urinalysis and urine culture   Orders Placed This Encounter  Procedures  . Urine Culture    Standing Status:   Future    Standing Expiration Date:   04/08/2018  . Urinalysis, Microscopic - [facility]    Standing Status:   Future    Standing Expiration Date:   04/08/2018    INTERVAL HISTORY: Please see below for problem oriented charting. She returns with family members for further follow-up She continues to have occasional oozing from the mass under the chin occasional bleeding She did not tolerate increased dose fentanyl Her husband has to give her increase frequency of Dilaudid but that is not controlling her pain well She denies recent nausea or constipation She is receiving IV fluids daily along with  nutritional supplement through her feeding tube, up to 6 cans/day She is maintaining her weight She denies recent choking, worsening cough or shortness of breath or fever She complained of recent dysuria and is wondering whether she might have urinary tract infection She has problems sleeping at night.  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 Tongue, biopsy, Anterior deep margin left SQUAMOUS CELL CARCINOMA 3. Tongue, biopsy, Posterior deep margin left NEGATIVE FOR CARCINOMA 4. Tongue, biopsy, Posterior margin left SQUAMOUS CELL CARCINOMA 5. Tongue, excisional biopsy, New anterior deep margin NEGATIVE FOR CARCINOMA 6. Tongue, excisional biopsy, New posterior deep margin SMALL FOCUS OF SQUAMOUS CELL CARCINOMA (0.1 CM, ONLY PRESENT AT THE DEEPER PERMINENT SECTION) 7. Tongue, resection for tumor, Left lateral INFILTRATIVE KERATINIZED SQUAMOUS CELL CARCINOMA (3.2 CM) THE TUMOR INVADES UNDERNEATH MUSCLE OF THE TONGUE (2.0 CM, PT2) LYMPHOVASCULAR AND PERINEURAL INVASION IDENTIFIED SQUAMOUS CELL CARCINOMA PRESENTED AT (FINAL MARGINS REFER TO PART 2-6) LATERAL INKED MARGIN (1.0 CM) MEDIAL INKED MARGIN (0.6 CM)      02/08/2016 Surgery    SURGICAL PROCEDURES: 1. Left hemiglossectomy with primary closure. 2. Left selective neck dissection (zones I-III).      03/02/2016 Imaging    CT chest with contrast: No evidence of metastatic disease in  the chest. 2. Aortic atherosclerosis.       03/02/2016 Imaging    CT neck with contrast showed : Newly enlarged and round 10 mm right level 1b lymph node (series 5, image 45). The level 1A nodes  remain normal. This right 1 B node is nonspecific but suspicious for progressive nodal metastatic disease. It might be amenable to Ultrasound-guided FNA. 2. Interval left hemiglossectomy and selective left neck dissection with confluent indeterminate soft tissue from the anterior left submandibular space through the left carotid space and obscuring the left level 1B, level 2, and level IIIa nodal stations. This study will serve as a new postoperative baseline of this area. Attention directed on close interval follow-up. 3. Mild soft tissue thickening also along the left lateral tongue resection margin. Attention directed on followup. 4. Chest CT today reported separately      03/14/2016 Procedure    She has normal baseline hearing test      03/15/2016 Procedure    1. Successful placement of a right internal jugular approach power injectable Port-A-Cath. The Port a catheter is ready for immediate use. 2. Successful fluoroscopic insertion of a 20-French pull-through gastrostomy tube. The gastrostomy may be used immediately for medication administration and may be utilized in 24 hrs for the initiation of feeds.      03/15/2016 - 04/30/2016 Radiation Therapy    Tongue and Bilateral Neck treated to 68 Gy in 33 fractions      03/16/2016 - 04/27/2016 Chemotherapy    She received weekly cisplatin. Cycle 2-6 reduced dose at 20 mg/m2 due to significant side-effects and poor tolerance       04/04/2016 Adverse Reaction    Chemotherapy is placed on hold due to acute renal failure      04/13/2016 Adverse Reaction    Chemo was placed on hold due to shingles outbreak and paronychia      05/29/2016 - 06/04/2016 Hospital Admission    She was admitted to the hospital for management of nausea, vomiting and inability to tolerate tube feeding      06/06/2016 - 06/13/2016 Hospital Admission    She was admitted to the hospital for management of nausea, vomiting and inability to tolerate tube feeding      08/09/2016  Imaging    1. New bilateral upper lobe ground-glass nodules. Favor infectious or inflammatory process. Recommend close attention on routine follow-up. 2. Angular nodule RIGHT upper lobe is also favored infectious or inflammatory. Recommend attention on follow-up as above. 3. No mediastinal or supraclavicular lymphadenopathy. 4. Small RIGHT thyroid nodule does not meet biopsy criteria. Please see separate CT neck dictation.      08/09/2016 Imaging    1. Progression of disease with new/markedly worsened mass of the left base of tongue measuring 2.9 x 2.2 x 2.3 centimeters. Persistent soft tissue thickening of the postoperative left neck extending from the angle of the mandible to the level of the hyoid bone. 2. Confluent, centrally necrotic mass in the right submental region, at the site of previously seen enlarged level 1B lymph node. This mass extends into the floor of the mouth and displaces or invades the right sublingual gland and the right mylohyoid and hyoglossus muscles. 3. Centrally necrotic right level IIa lymph node is new from the prior study and consistent with metastatic spread. No other lower cervical lymphadenopathy. 4. Multiple ground-glass opacities in the lung apices. Please see dedicated report for concomitant chest CT for more complete characterization. 5. Thickening of the epiglottis and aryepiglottic folds  is likely secondary to prior radiation treatment. No acute airway compromise.      08/22/2016 PET scan    1. The recently described progressive cervical disease is markedly hypermetabolic. This includes local recurrence in the left base of tongue and a confluent right submental nodal mass. In addition, there are small hypermetabolic lymph nodes on the right in station IIb and the anterior to the lower trachea.  2. No typical metastatic disease within the chest, abdomen or pelvis. 3. The recently described ground-glass opacities in both upper lobes have enlarged, become more  dense and are associated with moderate hypermetabolic activity. These are new from February and probably inflammatory/infectious. CT follow-up after appropriate therapy recommended.      08/27/2016 - 11/20/2016 Chemotherapy    She received palliative chemo with carboplatin/5FU and cetuximab      09/11/2016 Adverse Reaction    Dose of cetuximab is reduced due to skin toxicity      10/08/2016 PET scan    1. Decrease in metabolic activity of LEFT base of tongue lesion. 2. Decrease in size and metabolic activity of right submental adenopathy/malignancy 3. Reduction in metabolic activity of RIGHT level 2 lymph node. 4. No evidence of metastatic progression chest, abdomen, pelvis. 5. Persistent bilateral upper lobe foci of consolidation. Favored inflammatory or infectious etiology and consider adverse drug reaction.      01/28/2017 PET scan    Progression of tumor involving the left oropharynx and right floor of the mouth.  Two cervical nodes anterior to the right sternocleidomastoid muscle, new.  Multifocal patchy/nodular opacities in the bilateral upper lobes, indeterminate. However, there has been progression in the posterior right upper lobe when compared to the prior, raising the possibility of metastatic disease.  Hypermetabolic node in the right paratracheal region, nodal metastasis not excluded.       REVIEW OF SYSTEMS:   Constitutional: Denies fevers, chills or abnormal weight loss Eyes: Denies blurriness of vision Ears, nose, mouth, throat, and face: Denies mucositis or sore throat Respiratory: Denies cough, dyspnea or wheezes Cardiovascular: Denies palpitation, chest discomfort or lower extremity swelling Gastrointestinal:  Denies nausea, heartburn or change in bowel habits Skin: Denies abnormal skin rashes Lymphatics: Denies new lymphadenopathy or easy bruising Neurological:Denies numbness, tingling or new weaknesses Behavioral/Psych: Mood is stable, no new changes  All  other systems were reviewed with the patient and are negative.  I have reviewed the past medical history, past surgical history, social history and family history with the patient and they are unchanged from previous note.  ALLERGIES:  has No Known Allergies.  MEDICATIONS:  Current Outpatient Medications  Medication Sig Dispense Refill  . Amino Acids-Protein Hydrolys (FEEDING SUPPLEMENT, PRO-STAT SUGAR FREE 64,) LIQD Place 30 mLs into feeding tube daily. 900 mL 0  . chlorhexidine (PERIDEX) 0.12 % solution Rinse with 15 mls three times daily for 30 seconds. Use after breakfast, dinner, and at bedtime. Spit out excess. Do not swallow. 480 mL prn  . chlorpheniramine-HYDROcodone (TUSSIONEX) 10-8 MG/5ML SUER Place 5 mLs into feeding tube every 12 (twelve) hours as needed for cough. 473 mL 0  . clindamycin (CLINDAGEL) 1 % gel Apply topically 2 (two) times daily. (Patient not taking: Reported on 02/09/2017) 30 g 0  . HYDROcodone-homatropine (HYCODAN) 5-1.5 MG/5ML syrup Place 5 mLs into feeding tube every 6 (six) hours as needed for cough. 240 mL 0  . HYDROmorphone HCl (DILAUDID) 1 MG/ML LIQD Place 6 mLs (6 mg total) into feeding tube every 3 (three) hours as needed for  moderate pain or severe pain. 473 mL 0  . levothyroxine (SYNTHROID, LEVOTHROID) 75 MCG tablet Take 1 tablet (75 mcg total) by mouth daily before breakfast. 30 tablet 9  . lidocaine-prilocaine (EMLA) cream APPLY TO AFFECTED AREA ONCE AS DIRECTED 30 g 3  . LORazepam (ATIVAN) 1 MG tablet TAKE 1 TABLET BY MOUTH EVERY 8 HOURS AS NEEDED 60 tablet 0  . methadone (DOLOPHINE) 10 MG/5ML solution Take 5 mLs (10 mg total) by mouth 3 (three) times daily. 500 mL 0  . mirtazapine (REMERON SOL-TAB) 15 MG disintegrating tablet Take 1 tablet (15 mg total) by mouth at bedtime. (Patient not taking: Reported on 02/09/2017) 30 tablet 9  . Nutritional Supplements (FEEDING SUPPLEMENT, VITAL 1.5 CAL,) LIQD D/C continuous feeding pump. Give Vital 1.5 or equivalent, 7  bottles daily, bolus feeding via PEG. Flush tube with 60 mL free water before and after bolus feeding QID. In addition, flush tube with 250 mL free water QID. Send formula and syringes. Pick up pump. 7 Can 4  . Nutritional Supplements (PROMOD) LIQD Give 58ml of promod daily via feeding tube (Mix 25ml of promod and 8ml of water).  Flush feeding tube with 49ml of water before giving promod and after. 946 mL 12  . OLANZapine (ZYPREXA) 10 MG tablet Place 1 tablet (10 mg total) into feeding tube at bedtime. 30 tablet 9  . ondansetron (ZOFRAN ODT) 8 MG disintegrating tablet Take 1 tablet (8 mg total) by mouth every 8 (eight) hours as needed for nausea or vomiting. (Patient not taking: Reported on 08/10/2016) 30 tablet 5  . ondansetron (ZOFRAN) 8 MG tablet TAKE 1 TABLET BY MOUTH 2 TIMES DAILY AS NEEDED FOR REFRACTORY NAUSEA / VOMITING. START ON DAY 3 AFTER CHEMO. (Patient not taking: Reported on 02/09/2017) 90 tablet 3  . ondansetron (ZOFRAN) 8 MG tablet Take 1 tablet (8 mg total) by mouth every 8 (eight) hours as needed for nausea or vomiting. (Patient not taking: Reported on 02/09/2017) 20 tablet 2  . polyethylene glycol (MIRALAX / GLYCOLAX) packet Place 17 g into feeding tube daily. 14 each 0  . prochlorperazine (COMPAZINE) 10 MG tablet TAKE 1 TABLET BY MOUTH EVERY 6 HOURS AS NEEDED FOR NAUSEA AND VOMITING 30 tablet 1  . senna (SENOKOT) 8.6 MG tablet Take 1 tablet by mouth 2 (two) times daily.    Marland Kitchen zolpidem (AMBIEN) 10 MG tablet Take 1 tablet (10 mg total) by mouth at bedtime as needed for sleep. 30 tablet 5   No current facility-administered medications for this visit.    Facility-Administered Medications Ordered in Other Visits  Medication Dose Route Frequency Provider Last Rate Last Dose  . heparin lock flush 100 unit/mL  500 Units Intracatheter Once PRN Alvy Bimler, Seena Face, MD      . sodium chloride flush (NS) 0.9 % injection 10 mL  10 mL Intracatheter PRN Alvy Bimler, Sonji Starkes, MD        PHYSICAL EXAMINATION: ECOG  PERFORMANCE STATUS: 2 - Symptomatic, <50% confined to bed  Vitals:   03/04/17 1434  BP: (!) 124/58  Pulse: (!) 107  Resp: 18  Temp: 99 F (37.2 C)  SpO2: 99%   Filed Weights   03/04/17 1434  Weight: 173 lb 6.4 oz (78.7 kg)    GENERAL:alert, no distress and comfortable SKIN: skin color, texture, turgor are normal, no rashes or significant lesions EYES: normal, Conjunctiva are pink and non-injected, sclera clear OROPHARYNX: Noted persistent mass under the chin NECK: supple, thyroid normal size, non-tender, without nodularity LYMPH:  no palpable lymphadenopathy in the cervical, axillary or inguinal LUNGS: Scattered rhonchi with increased breathing effort HEART: regular rate & rhythm and no murmurs and no lower extremity edema ABDOMEN:abdomen soft, non-tender and normal bowel sounds Musculoskeletal:no cyanosis of digits and no clubbing  NEURO: alert & oriented x 3 with dysarthria, no focal motor/sensory deficits  LABORATORY DATA:  I have reviewed the data as listed    Component Value Date/Time   NA 132 (L) 02/09/2017 1636   NA 141 01/02/2017 1214   K 3.9 02/09/2017 1636   K 3.7 01/02/2017 1214   CL 94 (L) 02/09/2017 1636   CO2 30 02/09/2017 1636   CO2 27 01/02/2017 1214   GLUCOSE 134 (H) 02/09/2017 1636   GLUCOSE 106 01/02/2017 1214   BUN 15 02/09/2017 1636   BUN 17.4 01/02/2017 1214   CREATININE 0.84 02/09/2017 1636   CREATININE 1.1 01/02/2017 1214   CALCIUM 9.3 02/09/2017 1636   CALCIUM 9.6 01/02/2017 1214   PROT 7.3 02/09/2017 1636   PROT 8.4 (H) 01/02/2017 1214   ALBUMIN 3.0 (L) 02/09/2017 1636   ALBUMIN 3.9 01/02/2017 1214   AST 32 02/09/2017 1636   AST 35 (H) 01/02/2017 1214   ALT 25 02/09/2017 1636   ALT 21 01/02/2017 1214   ALKPHOS 79 02/09/2017 1636   ALKPHOS 91 01/02/2017 1214   BILITOT 0.5 02/09/2017 1636   BILITOT 0.33 01/02/2017 1214   GFRNONAA >60 02/09/2017 1636   GFRAA >60 02/09/2017 1636    No results found for: SPEP, UPEP  Lab Results   Component Value Date   WBC 11.4 (H) 02/09/2017   NEUTROABS 10.2 (H) 02/09/2017   HGB 10.1 (L) 02/09/2017   HCT 27.9 (L) 02/09/2017   MCV 82.8 02/09/2017   PLT 269 02/09/2017      Chemistry      Component Value Date/Time   NA 132 (L) 02/09/2017 1636   NA 141 01/02/2017 1214   K 3.9 02/09/2017 1636   K 3.7 01/02/2017 1214   CL 94 (L) 02/09/2017 1636   CO2 30 02/09/2017 1636   CO2 27 01/02/2017 1214   BUN 15 02/09/2017 1636   BUN 17.4 01/02/2017 1214   CREATININE 0.84 02/09/2017 1636   CREATININE 1.1 01/02/2017 1214      Component Value Date/Time   CALCIUM 9.3 02/09/2017 1636   CALCIUM 9.6 01/02/2017 1214   ALKPHOS 79 02/09/2017 1636   ALKPHOS 91 01/02/2017 1214   AST 32 02/09/2017 1636   AST 35 (H) 01/02/2017 1214   ALT 25 02/09/2017 1636   ALT 21 01/02/2017 1214   BILITOT 0.5 02/09/2017 1636   BILITOT 0.33 01/02/2017 1214       RADIOGRAPHIC STUDIES: I have personally reviewed the radiological images as listed and agreed with the findings in the report. Dg Chest 2 View  Result Date: 02/09/2017 CLINICAL DATA:  Fever and malaise.  Head neck carcinoma EXAM: CHEST  2 VIEW COMPARISON:  Chest radiograph January 03, 2017 and PET/CT January 28, 2017 FINDINGS: Port-A-Cath tip is at the cavoatrial junction. No pneumothorax. The patient's mandible obscures portions of the apices. There is patchy opacity in the right apex, stable compared to recent CT. No new parenchymal lung opacity evident. Heart size and pulmonary vascularity are normal. No adenopathy. No bone lesions. IMPRESSION: Persistent opacity right upper lobe. Question pneumonia versus neoplasm. Both entities may exist concurrently. The appearance is stable compared to recent PET-CT examination. Note that the patient's mandible obscures a portion of the left apex.  Lesion seen on recent PET-CT in the left apex may well continue to be present. No new opacity seen elsewhere. Stable cardiac silhouette. Port-A-Cath tip at cavoatrial  junction without pneumothorax. Electronically Signed   By: Lowella Grip III M.D.   On: 02/09/2017 16:04    All questions were answered. The patient knows to call the clinic with any problems, questions or concerns. No barriers to learning was detected.  I spent 25 minutes counseling the patient face to face. The total time spent in the appointment was 30 minutes and more than 50% was on counseling and review of test results  [name], MD 03/04/2017 2:51 PM

## 2017-03-04 NOTE — Assessment & Plan Note (Signed)
I have refilled her prescription Ambien She is also allowed to try mildly increased dose of olanzapine as needed

## 2017-03-04 NOTE — Assessment & Plan Note (Signed)
The patient is prone to get dehydrated She is being assessed on a weekly basis through advanced home care agency and has been receiving IV fluids daily as needed

## 2017-03-04 NOTE — Assessment & Plan Note (Signed)
She has poorly controlled pain She did not tolerate fentanyl patch I recommend trial of liquid methadone to be used in conjunction with liquid Dilaudid as needed I will reassess pain control in her next visit I warned her against the risk of nausea, constipation and sedation

## 2017-03-04 NOTE — Assessment & Plan Note (Signed)
She had recent symptoms of dysuria I will check urinalysis and urine culture

## 2017-03-05 ENCOUNTER — Ambulatory Visit: Payer: Self-pay | Admitting: Hematology and Oncology

## 2017-03-05 ENCOUNTER — Telehealth: Payer: Self-pay | Admitting: *Deleted

## 2017-03-05 LAB — URINE CULTURE

## 2017-03-05 NOTE — Telephone Encounter (Signed)
-----   Message from [name], MD sent at 03/05/2017 12:51 PM EST ----- Regarding: urine culture is neg PLs let her husband aware ----- Message ----- From: Interface, Lab In [place] Sent: 03/05/2017  12:27 PM To: [name], MD

## 2017-03-05 NOTE — Telephone Encounter (Signed)
Notified of message below

## 2017-03-12 ENCOUNTER — Other Ambulatory Visit: Payer: Self-pay

## 2017-03-12 ENCOUNTER — Inpatient Hospital Stay (HOSPITAL_COMMUNITY)
Admission: EM | Admit: 2017-03-12 | Discharge: 2017-04-01 | DRG: 178 | Disposition: E | Payer: 59 | Attending: Family Medicine | Admitting: Family Medicine

## 2017-03-12 ENCOUNTER — Encounter (HOSPITAL_COMMUNITY): Payer: Self-pay

## 2017-03-12 ENCOUNTER — Telehealth: Payer: Self-pay | Admitting: *Deleted

## 2017-03-12 ENCOUNTER — Emergency Department (HOSPITAL_COMMUNITY): Payer: 59

## 2017-03-12 DIAGNOSIS — F329 Major depressive disorder, single episode, unspecified: Secondary | ICD-10-CM | POA: Diagnosis present

## 2017-03-12 DIAGNOSIS — C023 Malignant neoplasm of anterior two-thirds of tongue, part unspecified: Secondary | ICD-10-CM

## 2017-03-12 DIAGNOSIS — G4733 Obstructive sleep apnea (adult) (pediatric): Secondary | ICD-10-CM | POA: Diagnosis present

## 2017-03-12 DIAGNOSIS — Z86010 Personal history of colonic polyps: Secondary | ICD-10-CM

## 2017-03-12 DIAGNOSIS — Z923 Personal history of irradiation: Secondary | ICD-10-CM

## 2017-03-12 DIAGNOSIS — F419 Anxiety disorder, unspecified: Secondary | ICD-10-CM | POA: Diagnosis present

## 2017-03-12 DIAGNOSIS — Z9049 Acquired absence of other specified parts of digestive tract: Secondary | ICD-10-CM | POA: Diagnosis not present

## 2017-03-12 DIAGNOSIS — Z8249 Family history of ischemic heart disease and other diseases of the circulatory system: Secondary | ICD-10-CM

## 2017-03-12 DIAGNOSIS — Z79890 Hormone replacement therapy (postmenopausal): Secondary | ICD-10-CM | POA: Diagnosis not present

## 2017-03-12 DIAGNOSIS — E440 Moderate protein-calorie malnutrition: Secondary | ICD-10-CM

## 2017-03-12 DIAGNOSIS — E785 Hyperlipidemia, unspecified: Secondary | ICD-10-CM | POA: Diagnosis present

## 2017-03-12 DIAGNOSIS — E43 Unspecified severe protein-calorie malnutrition: Secondary | ICD-10-CM | POA: Diagnosis not present

## 2017-03-12 DIAGNOSIS — Z96651 Presence of right artificial knee joint: Secondary | ICD-10-CM | POA: Diagnosis present

## 2017-03-12 DIAGNOSIS — C029 Malignant neoplasm of tongue, unspecified: Secondary | ICD-10-CM | POA: Diagnosis not present

## 2017-03-12 DIAGNOSIS — R4182 Altered mental status, unspecified: Secondary | ICD-10-CM

## 2017-03-12 DIAGNOSIS — Z9221 Personal history of antineoplastic chemotherapy: Secondary | ICD-10-CM

## 2017-03-12 DIAGNOSIS — Y95 Nosocomial condition: Secondary | ICD-10-CM | POA: Diagnosis present

## 2017-03-12 DIAGNOSIS — I10 Essential (primary) hypertension: Secondary | ICD-10-CM | POA: Diagnosis present

## 2017-03-12 DIAGNOSIS — J690 Pneumonitis due to inhalation of food and vomit: Secondary | ICD-10-CM | POA: Diagnosis present

## 2017-03-12 DIAGNOSIS — J189 Pneumonia, unspecified organism: Secondary | ICD-10-CM | POA: Diagnosis present

## 2017-03-12 DIAGNOSIS — Z6826 Body mass index (BMI) 26.0-26.9, adult: Secondary | ICD-10-CM

## 2017-03-12 DIAGNOSIS — E039 Hypothyroidism, unspecified: Secondary | ICD-10-CM | POA: Diagnosis present

## 2017-03-12 DIAGNOSIS — D630 Anemia in neoplastic disease: Secondary | ICD-10-CM | POA: Diagnosis not present

## 2017-03-12 DIAGNOSIS — Z515 Encounter for palliative care: Secondary | ICD-10-CM | POA: Diagnosis not present

## 2017-03-12 DIAGNOSIS — E871 Hypo-osmolality and hyponatremia: Secondary | ICD-10-CM | POA: Diagnosis present

## 2017-03-12 DIAGNOSIS — I129 Hypertensive chronic kidney disease with stage 1 through stage 4 chronic kidney disease, or unspecified chronic kidney disease: Secondary | ICD-10-CM | POA: Diagnosis present

## 2017-03-12 DIAGNOSIS — Z66 Do not resuscitate: Secondary | ICD-10-CM | POA: Diagnosis present

## 2017-03-12 DIAGNOSIS — G4700 Insomnia, unspecified: Secondary | ICD-10-CM | POA: Diagnosis present

## 2017-03-12 DIAGNOSIS — R918 Other nonspecific abnormal finding of lung field: Secondary | ICD-10-CM | POA: Diagnosis present

## 2017-03-12 DIAGNOSIS — G893 Neoplasm related pain (acute) (chronic): Secondary | ICD-10-CM

## 2017-03-12 DIAGNOSIS — D638 Anemia in other chronic diseases classified elsewhere: Secondary | ICD-10-CM | POA: Diagnosis present

## 2017-03-12 DIAGNOSIS — E878 Other disorders of electrolyte and fluid balance, not elsewhere classified: Secondary | ICD-10-CM | POA: Diagnosis not present

## 2017-03-12 DIAGNOSIS — Z531 Procedure and treatment not carried out because of patient's decision for reasons of belief and group pressure: Secondary | ICD-10-CM | POA: Diagnosis present

## 2017-03-12 DIAGNOSIS — Z931 Gastrostomy status: Secondary | ICD-10-CM

## 2017-03-12 DIAGNOSIS — N183 Chronic kidney disease, stage 3 (moderate): Secondary | ICD-10-CM | POA: Diagnosis present

## 2017-03-12 DIAGNOSIS — Z79899 Other long term (current) drug therapy: Secondary | ICD-10-CM

## 2017-03-12 LAB — COMPREHENSIVE METABOLIC PANEL
ALT: 22 U/L (ref 14–54)
AST: 26 U/L (ref 15–41)
Albumin: 2.8 g/dL — ABNORMAL LOW (ref 3.5–5.0)
Alkaline Phosphatase: 83 U/L (ref 38–126)
Anion gap: 7 (ref 5–15)
BILIRUBIN TOTAL: 0.5 mg/dL (ref 0.3–1.2)
BUN: 22 mg/dL — AB (ref 6–20)
CALCIUM: 9.7 mg/dL (ref 8.9–10.3)
CO2: 34 mmol/L — ABNORMAL HIGH (ref 22–32)
CREATININE: 0.74 mg/dL (ref 0.44–1.00)
Chloride: 85 mmol/L — ABNORMAL LOW (ref 101–111)
GFR calc Af Amer: >60 mL/min (ref >60)
Glucose, Bld: 126 mg/dL — ABNORMAL HIGH (ref 65–99)
Potassium: 4.2 mmol/L (ref 3.5–5.1)
Sodium: 126 mmol/L — ABNORMAL LOW (ref 135–145)
TOTAL PROTEIN: 7.4 g/dL (ref 6.5–8.1)

## 2017-03-12 LAB — CBC WITH DIFFERENTIAL/PLATELET
BASOS ABS: 0 10*3/uL (ref 0.0–0.1)
Basophils Relative: 0 %
EOS ABS: 0.2 10*3/uL (ref 0.0–0.7)
EOS PCT: 2 %
HCT: 24.3 % — ABNORMAL LOW (ref 36.0–46.0)
Hemoglobin: 8.6 g/dL — ABNORMAL LOW (ref 12.0–15.0)
Lymphocytes Relative: 3 %
Lymphs Abs: 0.3 10*3/uL — ABNORMAL LOW (ref 0.7–4.0)
MCH: 28.2 pg (ref 26.0–34.0)
MCHC: 35.4 g/dL (ref 30.0–36.0)
MCV: 79.7 fL (ref 78.0–100.0)
Monocytes Absolute: 0.9 10*3/uL (ref 0.1–1.0)
Monocytes Relative: 9 %
Neutro Abs: 9.2 10*3/uL — ABNORMAL HIGH (ref 1.7–7.7)
Neutrophils Relative %: 86 %
PLATELETS: 326 10*3/uL (ref 150–400)
RBC: 3.05 MIL/uL — AB (ref 3.87–5.11)
RDW: 18.7 % — ABNORMAL HIGH (ref 11.5–15.5)
WBC: 10.6 10*3/uL — AB (ref 4.0–10.5)

## 2017-03-12 LAB — BRAIN NATRIURETIC PEPTIDE: B Natriuretic Peptide: 53.3 pg/mL (ref 0.0–100.0)

## 2017-03-12 LAB — TROPONIN I

## 2017-03-12 MED ORDER — LEVOTHYROXINE SODIUM 50 MCG PO TABS
75.0000 ug | ORAL_TABLET | Freq: Every day | ORAL | Status: DC
  Filled 2017-03-12: qty 1

## 2017-03-12 MED ORDER — HYDROMORPHONE HCL 1 MG/ML IJ SOLN: 1.0000 mg | Freq: Once | INTRAMUSCULAR | Status: DC

## 2017-03-12 MED ORDER — ONDANSETRON HCL 4 MG PO TABS: 4.0000 mg | ORAL_TABLET | Freq: Four times a day (QID) | ORAL | Status: DC | PRN

## 2017-03-12 MED ORDER — MORPHINE SULFATE (PF) 10 MG/ML IV SOLN
10.0000 mg | INTRAVENOUS | Status: DC | PRN
  Administered 2017-03-12 (×4): 10 mg via INTRAVENOUS
  Filled 2017-03-12 (×4): qty 1

## 2017-03-12 MED ORDER — SODIUM CHLORIDE 0.9 % IV SOLN
1.0000 g | Freq: Three times a day (TID) | INTRAVENOUS | Status: DC
  Administered 2017-03-12 – 2017-03-13 (×2): 1 g via INTRAVENOUS
  Filled 2017-03-12 (×4): qty 1

## 2017-03-12 MED ORDER — ENOXAPARIN SODIUM 40 MG/0.4ML ~~LOC~~ SOLN
40.0000 mg | SUBCUTANEOUS | Status: DC
  Administered 2017-03-12: 40 mg via SUBCUTANEOUS
  Filled 2017-03-12: qty 0.4

## 2017-03-12 MED ORDER — HYDROMORPHONE HCL 1 MG/ML PO LIQD: 6.0000 mg | ORAL | Status: DC | PRN

## 2017-03-12 MED ORDER — ALBUTEROL SULFATE (2.5 MG/3ML) 0.083% IN NEBU: 2.5000 mg | INHALATION_SOLUTION | RESPIRATORY_TRACT | Status: DC | PRN

## 2017-03-12 MED ORDER — ACETAMINOPHEN 650 MG RE SUPP: 650.0000 mg | Freq: Four times a day (QID) | RECTAL | Status: DC | PRN

## 2017-03-12 MED ORDER — DEXTROSE-NACL 5-0.45 % IV SOLN
INTRAVENOUS | Status: DC
  Administered 2017-03-12: 21:00:00 via INTRAVENOUS

## 2017-03-12 MED ORDER — OLANZAPINE 5 MG PO TABS
10.0000 mg | ORAL_TABLET | Freq: Every day | ORAL | Status: DC
  Administered 2017-03-12: 10 mg
  Filled 2017-03-12: qty 2

## 2017-03-12 MED ORDER — CHLORHEXIDINE GLUCONATE 0.12 % MT SOLN
15.0000 mL | Freq: Two times a day (BID) | OROMUCOSAL | Status: DC
  Administered 2017-03-12 – 2017-03-13 (×2): 15 mL via OROMUCOSAL
  Filled 2017-03-12 (×2): qty 15

## 2017-03-12 MED ORDER — VANCOMYCIN HCL IN DEXTROSE 1-5 GM/200ML-% IV SOLN: 1000.0000 mg | Freq: Once | INTRAVENOUS | Status: DC

## 2017-03-12 MED ORDER — ACETAMINOPHEN 325 MG PO TABS: 650.0000 mg | ORAL_TABLET | Freq: Four times a day (QID) | ORAL | Status: DC | PRN

## 2017-03-12 MED ORDER — LORAZEPAM 1 MG PO TABS: 1.0000 mg | ORAL_TABLET | Freq: Three times a day (TID) | ORAL | Status: DC | PRN

## 2017-03-12 MED ORDER — MIRTAZAPINE 15 MG PO TBDP
15.0000 mg | ORAL_TABLET | Freq: Every day | ORAL | Status: DC
  Administered 2017-03-12: 15 mg via ORAL
  Filled 2017-03-12: qty 1

## 2017-03-12 MED ORDER — ALBUTEROL SULFATE (2.5 MG/3ML) 0.083% IN NEBU
2.5000 mg | INHALATION_SOLUTION | Freq: Four times a day (QID) | RESPIRATORY_TRACT | Status: DC
  Administered 2017-03-12 – 2017-03-13 (×3): 2.5 mg via RESPIRATORY_TRACT
  Filled 2017-03-12 (×3): qty 3

## 2017-03-12 MED ORDER — VANCOMYCIN HCL 10 G IV SOLR
1500.0000 mg | INTRAVENOUS | Status: AC
  Administered 2017-03-12: 1500 mg via INTRAVENOUS
  Filled 2017-03-12: qty 1500

## 2017-03-12 MED ORDER — ZOLPIDEM TARTRATE 5 MG PO TABS: 5.0000 mg | ORAL_TABLET | Freq: Every evening | ORAL | Status: DC | PRN

## 2017-03-12 MED ORDER — METHADONE HCL 10 MG/ML PO CONC
10.0000 mg | Freq: Three times a day (TID) | ORAL | Status: DC
  Administered 2017-03-12 – 2017-03-13 (×2): 10 mg via ORAL
  Filled 2017-03-12 (×2): qty 1

## 2017-03-12 MED ORDER — HYDROCODONE-ACETAMINOPHEN 5-325 MG PO TABS: 1.0000 | ORAL_TABLET | ORAL | Status: DC | PRN

## 2017-03-12 MED ORDER — PRO-STAT SUGAR FREE PO LIQD
30.0000 mL | Freq: Every day | ORAL | Status: DC
  Administered 2017-03-13: 30 mL
  Filled 2017-03-12: qty 30

## 2017-03-12 MED ORDER — METHADONE HCL 10 MG/5ML PO SOLN
10.0000 mg | Freq: Three times a day (TID) | ORAL | Status: DC
  Filled 2017-03-12 (×25): qty 5

## 2017-03-12 MED ORDER — VANCOMYCIN HCL IN DEXTROSE 750-5 MG/150ML-% IV SOLN
750.0000 mg | Freq: Two times a day (BID) | INTRAVENOUS | Status: DC
  Administered 2017-03-13: 750 mg via INTRAVENOUS
  Filled 2017-03-12 (×2): qty 150

## 2017-03-12 MED ORDER — SODIUM CHLORIDE 0.9 % IV SOLN
2.0000 g | Freq: Once | INTRAVENOUS | Status: AC
  Administered 2017-03-12: 2 g via INTRAVENOUS
  Filled 2017-03-12: qty 2

## 2017-03-12 MED ORDER — ONDANSETRON HCL 4 MG/2ML IJ SOLN: 4.0000 mg | Freq: Four times a day (QID) | INTRAMUSCULAR | Status: DC | PRN

## 2017-03-12 NOTE — ED Notes (Signed)
Patient put on bedpan but didn't urinate at this time.

## 2017-03-12 NOTE — Care Management Note (Signed)
Case Management Note  Patient Details  Name: Tiffany Yu MRN: [id number] Date of Birth: Mar 05, 1955  CM contacted by Santiago Glad with [facility] who advised pt was active with [facility] RN SW.  Expected Discharge Date:   Unknown               Expected Discharge Plan:  [place]  Post Acute Care Choice:  Home Health Choice offered to:  Patient, Spouse  HH Arranged:  RN, Social Work [employer] Agency:  [place]  Status of Service:  In process, will continue to follow  Rae Mar, RN 03/12/2017, 3:26 PM

## 2017-03-12 NOTE — H&P (Addendum)
[facility]                                                                                    Patient Demographics  Tiffany Yu, is a 62 y.o. female  CSN: [id number]  MRN: [id number]  DOB - 27-Jun-1955  Admit Date - 03/20/2017  Outpatient Primary MD for the patient is Bernerd Limbo, MD   With History of -  Past Medical History:  Diagnosis Date  . Anxiety   . Arthritis   . Depression    takes ZYban daily  . History of colon polyps    benign  . History of radiation therapy 03/15/16- 04/30/16   Tongue and bilateral neck 68 Gy in 33 fractions  . Hyperlipidemia    takes Atorvastatin daily  . Hypertension    takes HCTZ daily  . Hypomagnesemia 05/04/2016  . Hypothyroidism    takes Synthroid daily  . Insomnia    takes Ambien nightly and Xanax as needed  . Refusal of blood transfusions as patient is Jehov[employer]'s Witness   . squamous cell of the tongue dx'd 12/2015      Past Surgical History:  Procedure Laterality Date  . CHOLECYSTECTOMY    . COLONOSCOPY    . DILATION AND CURETTAGE OF UTERUS    . EXCISION OF TONGUE LESION Left 02/08/2016   Procedure: WIDE LOCAL EXCISION OF LEFT LATERAL TONGUE;  Surgeon: Jerrell Belfast, MD;  Location: [place];  Service: ENT;  Laterality: Left;  . FEMUR IM NAIL Right 02/28/2012   Procedure: OPEN REDUCTION INTERNAL FIXATION (ORIF) RIGHT KNEE;  Surgeon: Alta Corning, MD;  Location: [place];  Service: Orthopedics;  Laterality: Right;  . IR GENERIC HISTORICAL  03/15/2016   IR FLUORO GUIDE PORT INSERTION RIGHT 03/15/2016 Sandi Mariscal, MD [facility]  . IR GENERIC HISTORICAL  03/15/2016   IR US GUIDE VASC ACCESS RIGHT 03/15/2016 Sandi Mariscal, MD [facility]  . IR GENERIC HISTORICAL  03/15/2016   IR GASTROSTOMY TUBE MOD SED 03/15/2016 [facility]  . KNEE ARTHROPLASTY  12/14/2011   Procedure: COMPUTER ASSISTED TOTAL KNEE ARTHROPLASTY;  Surgeon: Alta Corning, MD;  Location: [place];  Service: Orthopedics;  Laterality: Right;  . KNEE ARTHROSCOPY      Right  . PLANTAR FASCIA SURGERY     right foot  . RADICAL NECK DISSECTION Left 02/08/2016   Procedure: LEFT SELECTIVE RADICAL NECK DISSECTION;  Surgeon: Jerrell Belfast, MD;  Location: [facility];  Service: ENT;  Laterality: Left;  . TENDON RELEASE     right wrist  . TOTAL KNEE ARTHROPLASTY  12/14/2011   right knee    in for   Chief Complaint  Patient presents with  . Shortness of Breath     HPI  Tiffany Yu  is a 62 y.o. female, with past medical history significant for metastatic tongue cancer , who is no longer receiving treatment presented today with worsening dyspnea and pain.  She could not give any significant history when I saw her due to pain medications.  The patient was recently treated for pneumonia.  Patient is dependent on tube feeding for nutrition/medication and cannot actively swallow.    Review  of Systems    Could not obtain   Social History Social History   Tobacco Use  . Smoking status: Never Smoker  . Smokeless tobacco: Never Used  Substance Use Topics  . Alcohol use: Yes    Comment: very rarely     Family History Family History  Problem Relation Age of Onset  . Anemia Mother   . Hypertension Mother   . Heart attack Father      Prior to Admission medications   Medication Sig Start Date End Date Taking? Authorizing Provider  Amino Acids-Protein Hydrolys (FEEDING SUPPLEMENT, PRO-STAT SUGAR FREE 64,) LIQD Place 30 mLs into feeding tube daily. 06/13/16  Yes Gorsuch, Ni, MD  chlorhexidine (PERIDEX) 0.12 % solution Rinse with 15 mls three times daily for 30 seconds. Use after breakfast, dinner, and at bedtime. Spit out excess. Do not swallow. 02/28/16  Yes Lenn Cal, DDS  HYDROmorphone HCl (DILAUDID) 1 MG/ML LIQD Place 6 mLs (6 mg total) into feeding tube every 3 (three) hours as needed for moderate pain or severe pain. 02/19/17  Yes [name], MD  levothyroxine (SYNTHROID, LEVOTHROID) 75 MCG tablet Take 1 tablet (75 mcg total) by mouth daily  before breakfast. 09/18/16  Yes Gorsuch, Ni, MD  LORazepam (ATIVAN) 1 MG tablet TAKE 1 TABLET BY MOUTH EVERY 8 HOURS AS NEEDED 02/25/17  Yes Alvy Bimler, Ni, MD  methadone (DOLOPHINE) 10 MG/5ML solution Take 5 mLs (10 mg total) by mouth 3 (three) times daily. 03/04/17  Yes Gorsuch, Ni, MD  mirtazapine (REMERON SOL-TAB) 15 MG disintegrating tablet Take 1 tablet (15 mg total) by mouth at bedtime. 06/13/16  Yes [name], MD  Nutritional Supplements (FEEDING SUPPLEMENT, VITAL 1.5 CAL,) LIQD D/C continuous feeding pump. Give Vital 1.5 or equivalent, 7 bottles daily, bolus feeding via PEG. Flush tube with 60 mL free water before and after bolus feeding QID. In addition, flush tube with 250 mL free water QID. Send formula and syringes. Pick up pump. 08/16/16  Yes Eppie Gibson, MD  Nutritional Supplements (PROMOD) LIQD Give 75ml of promod daily via feeding tube (Mix 5ml of promod and 61ml of water).  Flush feeding tube with 85ml of water before giving promod and after. 10/30/16  Yes Gorsuch, Ni, MD  OLANZapine (ZYPREXA) 10 MG tablet Place 1 tablet (10 mg total) into feeding tube at bedtime. 03/04/17  Yes Gorsuch, Ni, MD  zolpidem (AMBIEN) 10 MG tablet Take 1 tablet (10 mg total) by mouth at bedtime as needed for sleep. 03/04/17 04/03/17 Yes Gorsuch, Ni, MD  chlorpheniramine-HYDROcodone (TUSSIONEX) 10-8 MG/5ML SUER Place 5 mLs into feeding tube every 12 (twelve) hours as needed for cough. Patient not taking: Reported on 03/29/2017 01/17/17   [name], MD  clindamycin (CLINDAGEL) 1 % gel Apply topically 2 (two) times daily. Patient not taking: Reported on 03/08/2017 09/11/16   [name], MD  HYDROcodone-homatropine [facility]) 5-1.5 MG/5ML syrup Place 5 mLs into feeding tube every 6 (six) hours as needed for cough. Patient not taking: Reported on 03/03/2017 01/28/17   [name], MD  lidocaine-prilocaine (EMLA) cream APPLY TO AFFECTED AREA ONCE AS DIRECTED Patient not taking: Reported on 03/31/2017 11/13/16   [name], MD   ondansetron (ZOFRAN ODT) 8 MG disintegrating tablet Take 1 tablet (8 mg total) by mouth every 8 (eight) hours as needed for nausea or vomiting. Patient not taking: Reported on 03/21/2017 04/16/16   Eppie Gibson, MD  ondansetron (ZOFRAN) 8 MG tablet TAKE 1 TABLET BY MOUTH 2 TIMES DAILY AS NEEDED FOR  REFRACTORY NAUSEA / VOMITING. START ON DAY 3 AFTER CHEMO. Patient not taking: Reported on 03/03/2017 11/06/16   [name], MD  ondansetron (ZOFRAN) 8 MG tablet Take 1 tablet (8 mg total) by mouth every 8 (eight) hours as needed for nausea or vomiting. Patient not taking: Reported on 03/07/2017 01/03/17   Sandi Mealy E., PA-C  polyethylene glycol (MIRALAX / GLYCOLAX) packet Place 17 g into feeding tube daily. Patient not taking: Reported on 03/09/2017 06/13/16   [name], MD  prochlorperazine (COMPAZINE) 10 MG tablet TAKE 1 TABLET BY MOUTH EVERY 6 HOURS AS NEEDED FOR NAUSEA AND VOMITING Patient not taking: Reported on 03/20/2017 01/02/17   [name], MD    No Known Allergies  Physical Exam  Vitals  Blood pressure 111/64, pulse 96, temperature 97.9 F (36.6 C), resp. rate 20, height 5\' 8"  (1.727 m), weight 78.5 kg (173 lb), SpO2 100 %.   1. General chronically ill  2.  Lethargic.  3.  Not obtained neuro exam due to altered mental status.  4.  Facial edema noted, right jaw lesion, saliva drooping from both sides of her mouth.  5. Supple Neck, No JVD, No cervical lymphadenopathy appriciated, No Carotid Bruits.  6. Symmetrical mild scattered rhonchi, right chest catheter noted.  7.  Heart normal S1-S2  8. Positive Bowel Sounds, Abdomen Soft, Non tender, No organomegaly appriciated,  9.  No Cyanosis, decreased skin Turgor, No Skin Rash or Bruise.  10. Good muscle tone,  joints appear normal , no effusions, Normal ROM.    Data Review  CBC Recent Labs  Lab 03/18/2017 1124  WBC 10.6*  HGB 8.6*  HCT 24.3*  PLT 326  MCV 79.7  MCH 28.2  MCHC 35.4  RDW 18.7*  LYMPHSABS 0.3*   MONOABS 0.9  EOSABS 0.2  BASOSABS 0.0   ------------------------------------------------------------------------------------------------------------------  Chemistries  Recent Labs  Lab 03/18/2017 1124  NA 126*  K 4.2  CL 85*  CO2 34*  GLUCOSE 126*  BUN 22*  CREATININE 0.74  CALCIUM 9.7  AST 26  ALT 22  ALKPHOS 83  BILITOT 0.5   ------------------------------------------------------------------------------------------------------------------ estimated creatinine clearance is 79.2 mL/min (by C-G formula based on SCr of 0.74 mg/dL). ------------------------------------------------------------------------------------------------------------------ No results for input(s): TSH, T4TOTAL, T3FREE, THYROIDAB in the last 72 hours.  Invalid input(s): FREET3   Coagulation profile No results for input(s): INR, PROTIME in the last 168 hours. ------------------------------------------------------------------------------------------------------------------- No results for input(s): DDIMER in the last 72 hours. -------------------------------------------------------------------------------------------------------------------  Cardiac Enzymes Recent Labs  Lab 03/26/2017 1124  TROPONINI <0.03   ------------------------------------------------------------------------------------------------------------------ Invalid input(s): POCBNP   ---------------------------------------------------------------------------------------------------------------  Urinalysis    Component Value Date/Time   COLORURINE YELLOW 03/04/2017 1430   APPEARANCEUR CLEAR 03/04/2017 1430   LABSPEC 1.004 (L) 03/04/2017 1430   PHURINE 8.0 03/04/2017 1430   GLUCOSEU NEGATIVE 03/04/2017 1430   HGBUR NEGATIVE 03/04/2017 1430   BILIRUBINUR NEGATIVE 03/04/2017 1430   KETONESUR NEGATIVE 03/04/2017 1430   PROTEINUR NEGATIVE 03/04/2017 1430   NITRITE NEGATIVE 03/04/2017 1430   LEUKOCYTESUR NEGATIVE 03/04/2017 1430     ----------------------------------------------------------------------------------------------------------------     Imaging results:   Dg Chest 2 View  Result Date: 03/19/2017 CLINICAL DATA:  Shortness of breath for the past 2-3 days. History of head and neck cancer. EXAM: CHEST - 2 VIEW COMPARISON:  Chest x-ray dated February 09, 2017. FINDINGS: Unchanged right chest wall port catheter with the tip at the cavoatrial junction. The heart size and mediastinal contours are within normal limits. Patchy nodular opacities in the right  upper lobe and right lower lobe appear worsened when compared to prior study. Patchy nodular opacities in the left upper lobe are unchanged. No pleural effusion or pneumothorax. No acute osseous abnormality. IMPRESSION: 1. Worsening patchy nodular opacities in the right upper and lower lobes, which could reflect metastatic disease or multifocal pneumonia. Electronically Signed   By: Titus Dubin M.D.   On: 03/25/2017 12:20      Assessment & Plan  1.  H/CAP -multifocal Start IV vancomycin and cefepime    2.  Advanced tongue cancer/metastatic     Not on treatment anymore/grave prognosis  3.  Moderate malnutrition, continue feeding per tube  Goals of care should be re-discussed with the patient.   DVT Prophylaxis Lovenox  AM Labs Ordered, also please review Full Orders    Code Status DNR  Disposition Plan: Undetermined  Time spent in minutes : 38 minutes  Condition grave   @SIGNATURE @

## 2017-03-12 NOTE — Consult Note (Signed)
Tiffany Yu  Patient Care Team: Bernerd Limbo, MD as PCP - General (Family Medicine) Jerrell Belfast, MD as Consulting Physician (Otolaryngology) Eppie Gibson, MD as Attending Physician (Radiation Oncology) [name], MD as Consulting Physician (Hematology and Oncology) Leota Sauers, RN as Oncology Nurse Navigator (Oncology)  ASSESSMENT & PLAN:   Recurrent aspiration pneumonia Her clinical picture is compatible with recurrent aspiration pneumonia This is not unexpected given progression of her oropharyngeal disease I agree with broad-spectrum IV antibiotics Continue aggressive supportive care  Altered mental status likely due to recurrent aspiration pneumonia This is multifactorial, likely related to infection or sedative side effects from her pain medications Continue supportive care  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  She is not a candidate for further palliative chemotherapy.  Continue supportive care.  We will continue dressing changes and bandages for her necrotic wound discharge under her chin  Cancer associated pain The patient appears somewhat sedated For now, I recommend intermittent IV pain medicine only  Anemia chronic illness She does not need blood transfusion and would not agree for blood transfusion due to her Jehovah's Witness believes  Hyponatremia and significant electrolyte imbalance Likely secondary to pneumonia. Replace PRN  Protein calorie malnutrition She is on tube feedings at home  Goals of care discussion I discussed the situation with her husband over the phone. He confirmed that the patient and family has agreed on DO NOT RESUSCITATE We also discussed potential progression of her infection.  He agree that the patient will not be transferred to the ICU for aggressive care if she does not improve on IV antibiotics. Previously, she was referred for  home based palliative care/hospice but has not signed up  on hospice care at home. We will continue further discussion about goals of care depending on how she does over the next few days with broad-spectrum IV antibiotics  Discharge planning She is not ready to be discharged If she does not improve within the next 48 hours, I think it is reasonable to consider transition of care to comfort care mode.  I will continue to work with the family. I will return to check on her tomorrow  03/29/2017 [name], MD   CHIEF COMPLAINTS/PURPOSE OF CONSULTATION:  Requested by family members to evaluate this patient who is well known to me  HISTORY OF PRESENTING ILLNESS:  Tiffany Yu 62 y.o. female is well-known to me. She was seen on the regular basis for active supportive care for recurrent tongue cancer with significant metastatic disease to her chest. I was informed by advanced home care nursing staff that the patient has been directed to the emergency department due to altered mental status, worsening shortness of breath and confusion. At the time of evaluation, the patient is somewhat sleepy and not able to give much history. Her sister, Tiffany Yu, is not able to provide much history. I spoke with her husband, Tiffany Yu over the phone. He confirmed that the patient has progressive decline in performance status. Over the last few days, he has noted more active bloody discharge coming from the lower portion of her jaw. She also started to have worsening shortness of breath and more wheezing. Her mental status has declined and she looks progressively weaker. There were no recent reported recent severe pain, nausea, vomiting, or recent fever or chills. I have reviewed her chart and materials related to her cancer extensively and collaborated history with the patient. Summary of  oncologic history is as follows:   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[place])   02/08/2016 Pathology Results    Diagnosis 1.  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 Tongue, biopsy, Anterior deep margin left SQUAMOUS CELL CARCINOMA 3. Tongue, biopsy, Posterior deep margin left NEGATIVE FOR CARCINOMA 4. Tongue, biopsy, Posterior margin left SQUAMOUS CELL CARCINOMA 5. Tongue, excisional biopsy, New anterior deep margin NEGATIVE FOR CARCINOMA 6. Tongue, excisional biopsy, New posterior deep margin SMALL FOCUS OF SQUAMOUS CELL CARCINOMA (0.1 CM, ONLY PRESENT AT THE DEEPER PERMINENT SECTION) 7. Tongue, resection for tumor, Left lateral INFILTRATIVE KERATINIZED SQUAMOUS CELL CARCINOMA (3.2 CM) THE TUMOR INVADES UNDERNEATH MUSCLE OF THE TONGUE (2.0 CM, PT2) LYMPHOVASCULAR AND PERINEURAL INVASION IDENTIFIED SQUAMOUS CELL CARCINOMA PRESENTED AT (FINAL MARGINS REFER TO PART 2-6) LATERAL INKED MARGIN (1.0 CM) MEDIAL INKED MARGIN (0.6 CM)      02/08/2016 Surgery    SURGICAL PROCEDURES: 1. Left hemiglossectomy with primary closure. 2. Left selective neck dissection (zones I-III).      03/02/2016 Imaging    CT chest with contrast: No evidence of metastatic disease in the chest. 2. Aortic atherosclerosis.       03/02/2016 Imaging    CT neck with contrast showed : Newly enlarged and round 10 mm right level 1b lymph node (series 5, image 45). The level 1A nodes remain normal. This right 1 B node is nonspecific but suspicious for progressive nodal metastatic disease. It might be amenable to Ultrasound-guided FNA. 2. Interval left hemiglossectomy and selective left neck dissection with confluent indeterminate soft tissue from the anterior left submandibular space through the left carotid space and obscuring the left level 1B, level 2, and level IIIa nodal stations. This study will serve as a new  postoperative baseline of this area. Attention directed on close interval follow-up. 3. Mild soft tissue thickening also along the left lateral tongue resection margin. Attention directed on followup. 4. Chest CT today reported separately      03/14/2016 Procedure    She has normal baseline hearing test      03/15/2016 Procedure    1. Successful placement of a right internal jugular approach power injectable Port-A-Cath. The Port a catheter is ready for immediate use. 2. Successful fluoroscopic insertion of a 20-French pull-through gastrostomy tube. The gastrostomy may be used immediately for medication administration and may be utilized in 24 hrs for the initiation of feeds.      03/15/2016 - 04/30/2016 Radiation Therapy    Tongue and Bilateral Neck treated to 68 Gy in 33 fractions      03/16/2016 - 04/27/2016 Chemotherapy    She received weekly cisplatin. Cycle 2-6 reduced dose at 20 mg/m2 due to significant side-effects and poor tolerance       04/04/2016 Adverse Reaction    Chemotherapy is placed on hold due to acute renal failure      04/13/2016 Adverse Reaction    Chemo was placed on hold due to shingles outbreak and paronychia      05/29/2016 - 06/04/2016 Hospital Admission    She was admitted to the hospital for management of nausea, vomiting and inability to tolerate tube feeding      06/06/2016 - 06/13/2016 Hospital Admission    She was admitted to the hospital for management of nausea, vomiting  and inability to tolerate tube feeding      08/09/2016 Imaging    1. New bilateral upper lobe ground-glass nodules. Favor infectious or inflammatory process. Recommend close attention on routine follow-up. 2. Angular nodule RIGHT upper lobe is also favored infectious or inflammatory. Recommend attention on follow-up as above. 3. No mediastinal or supraclavicular lymphadenopathy. 4. Small RIGHT thyroid nodule does not meet biopsy criteria. Please see separate CT neck dictation.       08/09/2016 Imaging    1. Progression of disease with new/markedly worsened mass of the left base of tongue measuring 2.9 x 2.2 x 2.3 centimeters. Persistent soft tissue thickening of the postoperative left neck extending from the angle of the mandible to the level of the hyoid bone. 2. Confluent, centrally necrotic mass in the right submental region, at the site of previously seen enlarged level 1B lymph node. This mass extends into the floor of the mouth and displaces or invades the right sublingual gland and the right mylohyoid and hyoglossus muscles. 3. Centrally necrotic right level IIa lymph node is new from the prior study and consistent with metastatic spread. No other lower cervical lymphadenopathy. 4. Multiple ground-glass opacities in the lung apices. Please see dedicated report for concomitant chest CT for more complete characterization. 5. Thickening of the epiglottis and aryepiglottic folds is likely secondary to prior radiation treatment. No acute airway compromise.      08/22/2016 PET scan    1. The recently described progressive cervical disease is markedly hypermetabolic. This includes local recurrence in the left base of tongue and a confluent right submental nodal mass. In addition, there are small hypermetabolic lymph nodes on the right in station IIb and the anterior to the lower trachea.  2. No typical metastatic disease within the chest, abdomen or pelvis. 3. The recently described ground-glass opacities in both upper lobes have enlarged, become more dense and are associated with moderate hypermetabolic activity. These are new from February and probably inflammatory/infectious. CT follow-up after appropriate therapy recommended.      08/27/2016 - 11/20/2016 Chemotherapy    She received palliative chemo with carboplatin/5FU and cetuximab      09/11/2016 Adverse Reaction    Dose of cetuximab is reduced due to skin toxicity      10/08/2016 PET scan    1. Decrease in metabolic  activity of LEFT base of tongue lesion. 2. Decrease in size and metabolic activity of right submental adenopathy/malignancy 3. Reduction in metabolic activity of RIGHT level 2 lymph node. 4. No evidence of metastatic progression chest, abdomen, pelvis. 5. Persistent bilateral upper lobe foci of consolidation. Favored inflammatory or infectious etiology and consider adverse drug reaction.      01/28/2017 PET scan    Progression of tumor involving the left oropharynx and right floor of the mouth.  Two cervical nodes anterior to the right sternocleidomastoid muscle, new.  Multifocal patchy/nodular opacities in the bilateral upper lobes, indeterminate. However, there has been progression in the posterior right upper lobe when compared to the prior, raising the possibility of metastatic disease.  Hypermetabolic node in the right paratracheal region, nodal metastasis not excluded.       MEDICAL HISTORY:  Past Medical History:  Diagnosis Date  . Anxiety   . Arthritis   . Depression    takes ZYban daily  . History of colon polyps    benign  . History of radiation therapy 03/15/16- 04/30/16   Tongue and bilateral neck 68 Gy in 33 fractions  . Hyperlipidemia  takes Atorvastatin daily  . Hypertension    takes HCTZ daily  . Hypomagnesemia 05/04/2016  . Hypothyroidism    takes Synthroid daily  . Insomnia    takes Ambien nightly and Xanax as needed  . Refusal of blood transfusions as patient is Jehovah's Witness   . squamous cell of the tongue dx'd 12/2015    SURGICAL HISTORY: Past Surgical History:  Procedure Laterality Date  . CHOLECYSTECTOMY    . COLONOSCOPY    . DILATION AND CURETTAGE OF UTERUS    . EXCISION OF TONGUE LESION Left 02/08/2016   Procedure: WIDE LOCAL EXCISION OF LEFT LATERAL TONGUE;  Surgeon: Jerrell Belfast, MD;  Location: [facility];  Service: ENT;  Laterality: Left;  . FEMUR IM NAIL Right 02/28/2012   Procedure: OPEN REDUCTION INTERNAL FIXATION (ORIF) RIGHT KNEE;   Surgeon: Alta Corning, MD;  Location: [facility];  Service: Orthopedics;  Laterality: Right;  . IR GENERIC HISTORICAL  03/15/2016   IR FLUORO GUIDE PORT INSERTION RIGHT 03/15/2016 Sandi Mariscal, MD [facility]  . IR GENERIC HISTORICAL  03/15/2016   IR US GUIDE VASC ACCESS RIGHT 03/15/2016 Sandi Mariscal, MD [facility]  . IR GENERIC HISTORICAL  03/15/2016   IR GASTROSTOMY TUBE MOD SED 03/15/2016 [facility]  . KNEE ARTHROPLASTY  12/14/2011   Procedure: COMPUTER ASSISTED TOTAL KNEE ARTHROPLASTY;  Surgeon: Alta Corning, MD;  Location: [facility];  Service: Orthopedics;  Laterality: Right;  . KNEE ARTHROSCOPY     Right  . PLANTAR FASCIA SURGERY     right foot  . RADICAL NECK DISSECTION Left 02/08/2016   Procedure: LEFT SELECTIVE RADICAL NECK DISSECTION;  Surgeon: Jerrell Belfast, MD;  Location: [facility];  Service: ENT;  Laterality: Left;  . TENDON RELEASE     right wrist  . TOTAL KNEE ARTHROPLASTY  12/14/2011   right knee    SOCIAL HISTORY: Social History   Socioeconomic History  . Marital status: Married    Spouse name: Tiffany Yu  . Number of children: 2  . Years of education: College  . Highest education level: Not on file  Social Needs  . Financial resource strain: Not on file  . Food insecurity - worry: Not on file  . Food insecurity - inability: Not on file  . Transportation needs - medical: Not on file  . Transportation needs - non-medical: Not on file  Occupational History  . Occupation: Publishing copy     Employer: [employer]  Tobacco Use  . Smoking status: Never Smoker  . Smokeless tobacco: Never Used  Substance and Sexual Activity  . Alcohol use: Yes    Comment: very rarely  . Drug use: No  . Sexual activity: Not on file  Other Topics Concern  . Not on file  Social History Narrative   Patient is married Tiffany Yu) and lives at home with her husband.   Patient has a college education.   Patient drinks two sodas per day.   Patient has two children.   Patient is right-handed.           FAMILY HISTORY: Family History  Problem Relation Age of Onset  . Anemia Mother   . Hypertension Mother   . Heart attack Father     ALLERGIES:  has No Known Allergies.  MEDICATIONS:  Current Facility-Administered Medications  Medication Dose Route Frequency Provider Last Rate Last Dose  . Morphine Sulfate (PF) SOLN 10 mg  10 mg Intravenous Q2H PRN Carmin Muskrat, MD   10 mg  at 03/09/2017 1612  . [START ON 03/29/2017] vancomycin (VANCOCIN) IVPB 750 mg/150 ml premix  750 mg Intravenous Q12H Poindexter, Leann T, RPH       Facility-Administered Medications Ordered in Other Encounters  Medication Dose Route Frequency Provider Last Rate Last Dose  . heparin lock flush 100 unit/mL  500 Units Intracatheter Once PRN Alvy Bimler, Tanelle Lanzo, MD      . sodium chloride flush (NS) 0.9 % injection 10 mL  10 mL Intracatheter PRN Alvy Bimler, Margarit Minshall, MD        REVIEW OF SYSTEMS:   Unable to obtained except as outlined above PHYSICAL EXAMINATION: ECOG PERFORMANCE STATUS: 4 - Bedbound  Vitals:   03/22/2017 1500 03/10/2017 1530  BP: (!) 117/58 118/64  Pulse: 97 (!) 105  Resp: (!) 22 18  Temp:    SpO2: 100% 99%   Filed Weights   03/01/2017 1104  Weight: 173 lb (78.5 kg)    GENERAL: She is not alert.  Appears to be in mild respiratory distress from significant wheezing SKIN: The area under the chin is bandaged EYES: Her eyes are intermittently open OROPHARYNX: The swelling in her oropharynx is worse compared to her previous exam recently.  The tongue appears protuberant and she appears to have difficulties closing her mouth NECK: The neck mass/mass under the chin is worse LUNGS: Significant stridor on exam.  Increased work of breathing.  Diffuse wheezes  hEART: Tachycardia, regular rhythm and no lower extremity edema ABDOMEN:abdomen soft, non-tender and normal bowel sounds Musculoskeletal:no cyanosis of digits and no clubbing  PSYCH: Not alert and unable to  assess orientation NEURO: Unable to  assess  LABORATORY DATA:  I have reviewed the data as listed Lab Results  Component Value Date   WBC 10.6 (H) 03/11/2017   HGB 8.6 (L) 03/16/2017   HCT 24.3 (L) 03/02/2017   MCV 79.7 03/06/2017   PLT 326 03/26/2017   Recent Labs    01/17/17 1230 02/09/17 1636 03/06/2017 1124  NA 139 132* 126*  K 4.3 3.9 4.2  CL 99 94* 85*  CO2 32* 30 34*  GLUCOSE 100 134* 126*  BUN 20 15 22*  CREATININE 1.11* 0.84 0.74  CALCIUM 9.7 9.3 9.7  GFRNONAA 52* >60 >60  GFRAA 60* >60 >60  PROT 7.6 7.3 7.4  ALBUMIN 3.2* 3.0* 2.8*  AST 21 32 26  ALT 11 25 22   ALKPHOS 81 79 83  BILITOT 0.5 0.5 0.5    RADIOGRAPHIC STUDIES: I have personally reviewed the radiological images as listed and agreed with the findings in the report. Dg Chest 2 View  Result Date: 03/08/2017 CLINICAL DATA:  Shortness of breath for the past 2-3 days. History of head and neck cancer. EXAM: CHEST - 2 VIEW COMPARISON:  Chest x-ray dated February 09, 2017. FINDINGS: Unchanged right chest wall port catheter with the tip at the cavoatrial junction. The heart size and mediastinal contours are within normal limits. Patchy nodular opacities in the right upper lobe and right lower lobe appear worsened when compared to prior study. Patchy nodular opacities in the left upper lobe are unchanged. No pleural effusion or pneumothorax. No acute osseous abnormality. IMPRESSION: 1. Worsening patchy nodular opacities in the right upper and lower lobes, which could reflect metastatic disease or multifocal pneumonia. Electronically Signed   By: Titus Dubin M.D.   On: 03/29/2017 12:20   All questions were answered. The patient knows to call the clinic with any problems, questions or concerns.  [name], MD 03/12/2017 5:34 PM

## 2017-03-12 NOTE — Progress Notes (Signed)
A consult was received from an ED physician for vancomycin per pharmacy dosing.  The patient's profile has been reviewed for ht/wt/allergies/indication/available labs.   A one time order has been placed for vanc 1g.  Further antibiotics/pharmacy consults should be ordered by admitting physician if indicated.                       Thank you, Kara Mead 03/12/2017  2:02 PM

## 2017-03-12 NOTE — ED Triage Notes (Signed)
Patient c/o SOB x 2-3 days and pain associated with taking a deep breath. Patient has a history of throat and mouth cancer.

## 2017-03-12 NOTE — ED Notes (Signed)
ED TO INPATIENT HANDOFF REPORT  Name/Age/Gender Tiffany Yu 62 y.o. female  Code Status Code Status History    Date Active Date Inactive Code Status Order ID Comments User Context   06/06/2016 15:58 06/13/2016 14:46 Full Code [id number]  [name], MD Inpatient   05/29/2016 13:19 06/04/2016 16:41 Full Code [id number]  [name], MD Inpatient   02/08/2016 18:38 02/11/2016 14:59 Full Code [id number]  Jerrell Belfast, MD Inpatient   12/14/2011 14:56 12/16/2011 17:24 Full Code [id number]  Cristi Loron, RN Inpatient    Advance Directive Documentation     Most Recent Value  Type of Advance Directive  Healthcare Power of Attorney  Pre-existing out of facility DNR order (yellow form or pink MOST form)  No data  "MOST" Form in Place?  No data      Home/SNF/Other Home  Chief Complaint cancer pt / SOB   Level of Care/Admitting Diagnosis ED Disposition    ED Disposition Condition Comment   Admit  Hospital Area: [facility] [[id number]]  Level of Care: Med-Surg [16]  Diagnosis: HCAP (healthcare-associated pneumonia) [037096]  Admitting Physician: Merton Border Marshal.Browner  Attending Physician: Laren Everts, [place]  Estimated length of stay: past midnight tomorrow  Certification:: I certify this patient will need inpatient services for at least 2 midnights  PT Class (Do Not Modify): Inpatient [101]  PT Acc Code (Do Not Modify): Private [1]       Medical History Past Medical History:  Diagnosis Date  . Anxiety   . Arthritis   . Depression    takes ZYban daily  . History of colon polyps    benign  . History of radiation therapy 03/15/16- 04/30/16   Tongue and bilateral neck 68 Gy in 33 fractions  . Hyperlipidemia    takes Atorvastatin daily  . Hypertension    takes HCTZ daily  . Hypomagnesemia 05/04/2016  . Hypothyroidism    takes Synthroid daily  . Insomnia    takes Ambien nightly and Xanax as needed  . Refusal of blood transfusions as patient is Jehovah's Witness    . squamous cell of the tongue dx'd 12/2015    Allergies No Known Allergies  IV Location/Drains/Wounds Patient Lines/Drains/Airways Status   Active Line/Drains/Airways    Name:   Placement date:   Placement time:   Site:   Days:   Implanted Port 03/15/16 Right Chest   03/15/16    1519    Chest   362   Gastrostomy/Enterostomy Gastrostomy   03/08/16    -    -   369   Incision 02/28/12 Knee Right   02/28/12    2146     1839   Incision (Closed) 02/08/16 Neck Left   02/08/16    1524     398          Labs/Imaging Results for orders placed or performed during the hospital encounter of 03/08/2017 (from the past 48 hour(s))  Comprehensive metabolic panel     Status: Abnormal   Collection Time: 03/01/2017 11:24 AM  Result Value Ref Range   Sodium 126 (L) 135 - 145 mmol/L   Potassium 4.2 3.5 - 5.1 mmol/L   Chloride 85 (L) 101 - 111 mmol/L   CO2 34 (H) 22 - 32 mmol/L   Glucose, Bld 126 (H) 65 - 99 mg/dL   BUN 22 (H) 6 - 20 mg/dL   Creatinine, Ser 0.74 0.44 - 1.00 mg/dL   Calcium 9.7 8.9 - 10.3 mg/dL  Total Protein 7.4 6.5 - 8.1 g/dL   Albumin 2.8 (L) 3.5 - 5.0 g/dL   AST 26 15 - 41 U/L   ALT 22 14 - 54 U/L   Alkaline Phosphatase 83 38 - 126 U/L   Total Bilirubin 0.5 0.3 - 1.2 mg/dL   GFR calc non Af Amer >60 >60 mL/min   GFR calc Af Amer >60 >60 mL/min    Comment: (NOTE) The eGFR has been calculated using the CKD EPI equation. This calculation has not been validated in all clinical situations. eGFR's persistently <60 mL/min signify possible Chronic Kidney Disease.    Anion gap 7 5 - 15    Comment: Performed at [facility], [place] [address], [place], [place] [address]  CBC WITH DIFFERENTIAL     Status: Abnormal   Collection Time: 03/27/2017 11:24 AM  Result Value Ref Range   WBC 10.6 (H) 4.0 - 10.5 K/uL   RBC 3.05 (L) 3.87 - 5.11 MIL/uL   Hemoglobin 8.6 (L) 12.0 - 15.0 g/dL   HCT 24.3 (L) 36.0 - 46.0 %   MCV 79.7 78.0 - 100.0 fL   MCH 28.2 26.0 - 34.0 pg    MCHC 35.4 30.0 - 36.0 g/dL   RDW 18.7 (H) 11.5 - 15.5 %   Platelets 326 150 - 400 K/uL   Neutrophils Relative % 86 %   Neutro Abs 9.2 (H) 1.7 - 7.7 K/uL   Lymphocytes Relative 3 %   Lymphs Abs 0.3 (L) 0.7 - 4.0 K/uL   Monocytes Relative 9 %   Monocytes Absolute 0.9 0.1 - 1.0 K/uL   Eosinophils Relative 2 %   Eosinophils Absolute 0.2 0.0 - 0.7 K/uL   Basophils Relative 0 %   Basophils Absolute 0.0 0.0 - 0.1 K/uL    Comment: Performed at [facility], [place] [address], [place], [place] [address]  Troponin I (MHP)     Status: None   Collection Time: 03/10/2017 11:24 AM  Result Value Ref Range   Troponin I <0.03 <0.03 ng/mL    Comment: Performed at [facility], [place] [address] [place], [place] [address]  Brain natriuretic peptide     Status: None   Collection Time: 03/16/2017 11:24 AM  Result Value Ref Range   B Natriuretic Peptide 53.3 0.0 - 100.0 pg/mL    Comment: Performed at [facility], [place] [address] [place], [place] [address]   Dg Chest 2 View  Result Date: 03/16/2017 CLINICAL DATA:  Shortness of breath for the past 2-3 days. History of head and neck cancer. EXAM: CHEST - 2 VIEW COMPARISON:  Chest x-ray dated February 09, 2017. FINDINGS: Unchanged right chest wall port catheter with the tip at the cavoatrial junction. The heart size and mediastinal contours are within normal limits. Patchy nodular opacities in the right upper lobe and right lower lobe appear worsened when compared to prior study. Patchy nodular opacities in the left upper lobe are unchanged. No pleural effusion or pneumothorax. No acute osseous abnormality. IMPRESSION: 1. Worsening patchy nodular opacities in the right upper and lower lobes, which could reflect metastatic disease or multifocal pneumonia. Electronically Signed   By: Titus Dubin M.D.   On: 03/14/2017 12:20    Pending Labs Unresulted Labs (From admission, onward)   Start     Ordered   Signed  and Held  Creatinine, serum  (enoxaparin (LOVENOX)    CrCl >/= 30 ml/min)  Weekly,   R  Comments:  while on enoxaparin therapy    Signed and Held   Signed and Held  Gram stain  Once,   R     Signed and Held   Signed and Held  Strep pneumoniae urinary antigen  Once,   R     Signed and Held      Vitals/Pain Today's Vitals   03/17/2017 1330 03/07/2017 1430 03/01/2017 1500 03/23/2017 1530  BP: 111/64 123/63 (!) 117/58 118/64  Pulse:  97 97 (!) 105  Resp: 20 16 (!) 22 18  Temp:      SpO2:  99% 100% 99%  Weight:      Height:      PainSc:        Isolation Precautions No active isolations  Medications Medications  Morphine Sulfate (PF) SOLN 10 mg (10 mg Intravenous Given 03/31/2017 1355)  vancomycin (VANCOCIN) 1,500 mg in sodium chloride 0.9 % 500 mL IVPB (1,500 mg Intravenous New Bag/Given 03/17/2017 1500)  vancomycin (VANCOCIN) IVPB 750 mg/150 ml premix (not administered)  ceFEPIme (MAXIPIME) 2 g in sodium chloride 0.9 % 100 mL IVPB (0 g Intravenous Stopped 03/20/2017 1443)    Mobility walks with person assist

## 2017-03-12 NOTE — Progress Notes (Signed)
Pharmacy Antibiotic Note  Tiffany Yu is a 62 y.o. female admitted on 03/02/2017 with pneumonia.  PMH significant for metastatic tongue cancer; recent pneumonia.  Cefepime 2gm IV x 1 dose has been given in ED.  Vancomycin 1gm IV ordered in ED; has not yet been given, so will d/c and order 20mg /kg loading dose.  Pharmacy has been consulted for Vancomycin dosing.  Plan: -Vancomycin 2gm IV x 1 loading dose followed by 750mg  IV q12h (Goal AUC 400-500) - Cefepime per MD - Follow renal function - Check vancomycin levels when appropriate  Height: 5\' 8"  (172.7 cm) Weight: 173 lb (78.5 kg) IBW/kg (Calculated) : 63.9  Temp (24hrs), Avg:97.9 F (36.6 C), Min:97.9 F (36.6 C), Max:97.9 F (36.6 C)  Recent Labs  Lab 03/05/2017 1124  WBC 10.6*  CREATININE 0.74    Estimated Creatinine Clearance: 79.2 mL/min (by C-G formula based on SCr of 0.74 mg/dL).    No Known Allergies  Antimicrobials this admission: 3/12 Cefepime >>   3/12 Vanc >>    Dose adjustments this admission:   Microbiology results:  Thank you for allowing pharmacy to be a part of this patient's care.  Everette Rank, PharmD 03/11/2017 2:39 PM

## 2017-03-12 NOTE — ED Notes (Signed)
Pt voided on bedpan. Pericare provided.

## 2017-03-12 NOTE — ED Notes (Signed)
Per Dr Vanita Panda patient can have feeding supplement.

## 2017-03-12 NOTE — Telephone Encounter (Signed)
VM message left @9 :46 am regarding pt. Family did not identify herself but she stated pt was feeling SOB and was having pain with her breathing. TCT family member@ 10:10-she states Zamya is SOB and having painful respirations.  She states she is taking Ms. Chamber to the ED. I advised her that was the appropriate thing to do. I also let family member know that I would inform Dr. Alvy Bimler of the situation. TCT Tammie, RN with Dr. Alvy Bimler and informed her of the above.

## 2017-03-12 NOTE — Progress Notes (Signed)
RT placed pt. on HFNC/[place] due to pt. needing > flow to keep oxygen saturations >92%.

## 2017-03-12 NOTE — ED Provider Notes (Signed)
[facility] DEPT Provider Note   CSN: [id number] Arrival date & time: 03/27/2017  1037     History   Chief Complaint Chief Complaint  Patient presents with  . Shortness of Breath    HPI Tiffany Yu is a 62 y.o. female.  HPI Presents with her sister who assists with the HPI. Patient is advanced head and neck cancer, is no longer receiving treatment guided therapy, and now presents with dyspnea, pain. Patient has had pain focally about the neck, lower face, and chest. Pain is sore, severe, present in spite of using multiple home narcotic medications. There is worsening dyspnea, onset unclear, but worse over the past days. Patient has recent episode of pneumonia, she notes. No reported new fever, vomiting. Patient is dependent on a feeding tube for nutrition, medication.  Past Medical History:  Diagnosis Date  . Anxiety   . Arthritis   . Depression    takes ZYban daily  . History of colon polyps    benign  . History of radiation therapy 03/15/16- 04/30/16   Tongue and bilateral neck 68 Gy in 33 fractions  . Hyperlipidemia    takes Atorvastatin daily  . Hypertension    takes HCTZ daily  . Hypomagnesemia 05/04/2016  . Hypothyroidism    takes Synthroid daily  . Insomnia    takes Ambien nightly and Xanax as needed  . Refusal of blood transfusions as patient is Jehovah's Witness   . squamous cell of the tongue dx'd 12/2015    Patient Active Problem List   Diagnosis Date Noted  . Dysuria 03/04/2017  . At risk for aspiration pneumonia 02/06/2017  . Mass of chin 01/29/2017  . Pneumonia 01/18/2017  . Dehydration 10/23/2016  . Deficiency anemia 10/16/2016  . Insomnia disorder 10/08/2016  . Acneiform drug eruption 09/11/2016  . CKD (chronic kidney disease), stage III ([place]) 09/11/2016  . Physical debility 08/24/2016  . Dysphagia 08/03/2016  . Depression due to physical illness 06/12/2016  . Acquired lymphedema 05/11/2016  . Hypomagnesemia  05/04/2016  . Paronychia of right thumb 04/13/2016  . Pancytopenia, acquired ([place]) 04/12/2016  . Weight loss 03/08/2016  . Dysarthria 03/08/2016  . Goals of care, counseling/discussion 03/08/2016  . Cancer associated pain 03/07/2016  . 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[place]) 02/08/2016  . Tongue cancer ([place]) 02/08/2016  . 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 OSA on CPAP 01/14/2014  . Snoring 05/06/2013  . Chronic allergic rhinitis 05/06/2013  . Morbid obesity ([place]) 05/06/2013  . Fracture of femur, distal, right, closed ([place]) 02/29/2012  . Osteoarthritis of right knee 12/14/2011    Past Surgical History:  Procedure Laterality Date  . CHOLECYSTECTOMY    . COLONOSCOPY    . DILATION AND CURETTAGE OF UTERUS    . EXCISION OF TONGUE LESION Left 02/08/2016   Procedure: WIDE LOCAL EXCISION OF LEFT LATERAL TONGUE;  Surgeon: Jerrell Belfast, MD;  Location: [facility];  Service: ENT;  Laterality: Left;  . FEMUR IM NAIL Right 02/28/2012   Procedure: OPEN REDUCTION INTERNAL FIXATION (ORIF) RIGHT KNEE;  Surgeon: Alta Corning, MD;  Location: [facility];  Service: Orthopedics;  Laterality: Right;  . IR GENERIC HISTORICAL  03/15/2016   IR FLUORO GUIDE PORT INSERTION RIGHT 03/15/2016 Sandi Mariscal, MD [facility]INTERV RAD  . IR GENERIC HISTORICAL  03/15/2016   IR US GUIDE VASC ACCESS RIGHT 03/15/2016 Sandi Mariscal, MD [facility]  . IR GENERIC HISTORICAL  03/15/2016   IR GASTROSTOMY TUBE MOD SED  03/15/2016 [facility]  . KNEE ARTHROPLASTY  12/14/2011   Procedure: COMPUTER ASSISTED TOTAL KNEE ARTHROPLASTY;  Surgeon: Alta Corning, MD;  Location: [facility];  Service: Orthopedics;  Laterality: Right;  . KNEE ARTHROSCOPY     Right  . PLANTAR FASCIA SURGERY     right foot  . RADICAL NECK DISSECTION Left 02/08/2016   Procedure: LEFT SELECTIVE RADICAL NECK DISSECTION;  Surgeon: Jerrell Belfast, MD;  Location: [facility];  Service: ENT;  Laterality: Left;  . TENDON RELEASE     right wrist  . TOTAL KNEE ARTHROPLASTY   12/14/2011   right knee    OB History    No data available       Home Medications    Prior to Admission medications   Medication Sig Start Date End Date Taking? Authorizing Provider  Amino Acids-Protein Hydrolys (FEEDING SUPPLEMENT, PRO-STAT SUGAR FREE 64,) LIQD Place 30 mLs into feeding tube daily. 06/13/16  Yes Gorsuch, Ni, MD  chlorhexidine (PERIDEX) 0.12 % solution Rinse with 15 mls three times daily for 30 seconds. Use after breakfast, dinner, and at bedtime. Spit out excess. Do not swallow. 02/28/16  Yes Lenn Cal, DDS  HYDROmorphone HCl (DILAUDID) 1 MG/ML LIQD Place 6 mLs (6 mg total) into feeding tube every 3 (three) hours as needed for moderate pain or severe pain. 02/19/17  Yes [name], MD  levothyroxine (SYNTHROID, LEVOTHROID) 75 MCG tablet Take 1 tablet (75 mcg total) by mouth daily before breakfast. 09/18/16  Yes Gorsuch, Ni, MD  LORazepam (ATIVAN) 1 MG tablet TAKE 1 TABLET BY MOUTH EVERY 8 HOURS AS NEEDED 02/25/17  Yes Alvy Bimler, Ni, MD  methadone (DOLOPHINE) 10 MG/5ML solution Take 5 mLs (10 mg total) by mouth 3 (three) times daily. 03/04/17  Yes Gorsuch, Ni, MD  mirtazapine (REMERON SOL-TAB) 15 MG disintegrating tablet Take 1 tablet (15 mg total) by mouth at bedtime. 06/13/16  Yes [name], MD  Nutritional Supplements (FEEDING SUPPLEMENT, VITAL 1.5 CAL,) LIQD D/C continuous feeding pump. Give Vital 1.5 or equivalent, 7 bottles daily, bolus feeding via PEG. Flush tube with 60 mL free water before and after bolus feeding QID. In addition, flush tube with 250 mL free water QID. Send formula and syringes. Pick up pump. 08/16/16  Yes Eppie Gibson, MD  Nutritional Supplements (PROMOD) LIQD Give 57ml of promod daily via feeding tube (Mix 55ml of promod and 79ml of water).  Flush feeding tube with 68ml of water before giving promod and after. 10/30/16  Yes Gorsuch, Ni, MD  OLANZapine (ZYPREXA) 10 MG tablet Place 1 tablet (10 mg total) into feeding tube at bedtime. 03/04/17  Yes  Gorsuch, Ni, MD  zolpidem (AMBIEN) 10 MG tablet Take 1 tablet (10 mg total) by mouth at bedtime as needed for sleep. 03/04/17 04/03/17 Yes Gorsuch, Ni, MD  chlorpheniramine-HYDROcodone (TUSSIONEX) 10-8 MG/5ML SUER Place 5 mLs into feeding tube every 12 (twelve) hours as needed for cough. Patient not taking: Reported on 03/01/2017 01/17/17   [name], MD  clindamycin (CLINDAGEL) 1 % gel Apply topically 2 (two) times daily. Patient not taking: Reported on 03/08/2017 09/11/16   [name], MD  HYDROcodone-homatropine [facility]) 5-1.5 MG/5ML syrup Place 5 mLs into feeding tube every 6 (six) hours as needed for cough. Patient not taking: Reported on 03/11/2017 01/28/17   [name], MD  lidocaine-prilocaine (EMLA) cream APPLY TO AFFECTED AREA ONCE AS DIRECTED Patient not taking: Reported on 03/09/2017 11/13/16   [name], MD  ondansetron (ZOFRAN ODT) 8 MG disintegrating tablet  Take 1 tablet (8 mg total) by mouth every 8 (eight) hours as needed for nausea or vomiting. Patient not taking: Reported on 03/17/2017 04/16/16   Eppie Gibson, MD  ondansetron (ZOFRAN) 8 MG tablet TAKE 1 TABLET BY MOUTH 2 TIMES DAILY AS NEEDED FOR REFRACTORY NAUSEA / VOMITING. START ON DAY 3 AFTER CHEMO. Patient not taking: Reported on 03/20/2017 11/06/16   [name], MD  ondansetron (ZOFRAN) 8 MG tablet Take 1 tablet (8 mg total) by mouth every 8 (eight) hours as needed for nausea or vomiting. Patient not taking: Reported on 03/03/2017 01/03/17   Sandi Mealy E., PA-C  polyethylene glycol (MIRALAX / GLYCOLAX) packet Place 17 g into feeding tube daily. Patient not taking: Reported on 03/29/2017 06/13/16   [name], MD  prochlorperazine (COMPAZINE) 10 MG tablet TAKE 1 TABLET BY MOUTH EVERY 6 HOURS AS NEEDED FOR NAUSEA AND VOMITING Patient not taking: Reported on 03/29/2017 01/02/17   [name], MD    Family History Family History  Problem Relation Age of Onset  . Anemia Mother   . Hypertension Mother   . Heart attack Father      Social History Social History   Tobacco Use  . Smoking status: Never Smoker  . Smokeless tobacco: Never Used  Substance Use Topics  . Alcohol use: Yes    Comment: very rarely  . Drug use: No     Allergies   Patient has no known allergies.   Review of Systems Review of Systems  Constitutional:       Per HPI, otherwise negative  HENT:       Per HPI, otherwise negative  Respiratory:       Per HPI, otherwise negative  Cardiovascular:       Per HPI, otherwise negative  Gastrointestinal: Positive for nausea. Negative for vomiting.  Endocrine:       Negative aside from HPI  Genitourinary:       Neg aside from HPI   Musculoskeletal:       Per HPI, otherwise negative  Skin: Positive for color change and wound.  Allergic/Immunologic: Positive for immunocompromised state.  Neurological: Positive for weakness. Negative for syncope.     Physical Exam Updated Vital Signs BP 111/64   Pulse 96   Temp 97.9 F (36.6 C)   Resp 20   Ht 5\' 8"  (1.727 m)   Wt 78.5 kg (173 lb)   SpO2 100%   BMI 26.30 kg/m   Physical Exam  Constitutional: She is oriented to person, place, and time. No distress.  Uncomfortable appearing female sitting upright, with a large growth on her neck and lower face  HENT:  Head: Normocephalic and atraumatic.    Eyes: Conjunctivae and EOM are normal.  Cardiovascular: Regular rhythm. Tachycardia present.  Pulmonary/Chest: No stridor. Tachypnea noted. She has decreased breath sounds.  Abdominal: She exhibits no distension.    Musculoskeletal: She exhibits edema.  Neurological: She is alert and oriented to person, place, and time. No cranial nerve deficit.  Skin: Skin is warm and dry.  Psychiatric: She has a normal mood and affect.  Nursing note and vitals reviewed.    ED Treatments / Results  Labs (all labs ordered are listed, but only abnormal results are displayed) Labs Reviewed  COMPREHENSIVE METABOLIC PANEL - Abnormal; Notable for  the following components:      Result Value   Sodium 126 (*)    Chloride 85 (*)    CO2 34 (*)    Glucose, Bld  126 (*)    BUN 22 (*)    Albumin 2.8 (*)    All other components within normal limits  CBC WITH DIFFERENTIAL/PLATELET - Abnormal; Notable for the following components:   WBC 10.6 (*)    RBC 3.05 (*)    Hemoglobin 8.6 (*)    HCT 24.3 (*)    RDW 18.7 (*)    Neutro Abs 9.2 (*)    Lymphs Abs 0.3 (*)    All other components within normal limits  TROPONIN I  BRAIN NATRIURETIC PEPTIDE    EKG  EKG Interpretation  Date/Time:  Tuesday March 12 2017 10:52:12 EDT Ventricular Rate:  102 PR Interval:    QRS Duration: 87 QT Interval:  328 QTC Calculation: 428 R Axis:   0 Text Interpretation:  Sinus tachycardia Anterior infarct, old Baseline wander in lead(s) V6 ST-t wave abnormality Abnormal ekg Confirmed by Carmin Muskrat [phone]) on 03/19/2017 11:30:21 AM       Radiology Dg Chest 2 View  Result Date: 03/08/2017 CLINICAL DATA:  Shortness of breath for the past 2-3 days. History of head and neck cancer. EXAM: CHEST - 2 VIEW COMPARISON:  Chest x-ray dated February 09, 2017. FINDINGS: Unchanged right chest wall port catheter with the tip at the cavoatrial junction. The heart size and mediastinal contours are within normal limits. Patchy nodular opacities in the right upper lobe and right lower lobe appear worsened when compared to prior study. Patchy nodular opacities in the left upper lobe are unchanged. No pleural effusion or pneumothorax. No acute osseous abnormality. IMPRESSION: 1. Worsening patchy nodular opacities in the right upper and lower lobes, which could reflect metastatic disease or multifocal pneumonia. Electronically Signed   By: Titus Dubin M.D.   On: 03/24/2017 12:20    Procedures Procedures (including critical care time)  Medications Ordered in ED Medications  Morphine Sulfate (PF) SOLN 10 mg (10 mg Intravenous Given 03/02/2017 1355)  ceFEPIme (MAXIPIME) 1 g  in sodium chloride 0.9 % 100 mL IVPB (not administered)   For the initial evaluation I reviewed the patient's chart including Dr. mentation from with results from CT scan, late last year, as below  IMPRESSION: 1. Progression of disease with new/markedly worsened mass of the left base of tongue measuring 2.9 x 2.2 x 2.3 centimeters. Persistent soft tissue thickening of the postoperative left neck extending from the angle of the mandible to the level of the hyoid bone. 2. Confluent, centrally necrotic mass in the right submental region, at the site of previously seen enlarged level 1B lymph node. This mass extends into the floor of the mouth and displaces or invades the right sublingual gland and the right mylohyoid and hyoglossus muscles. 3. Centrally necrotic right level IIa lymph node is new from the prior study and consistent with metastatic spread. No other lower cervical lymphadenopathy. 4. Multiple ground-glass opacities in the lung apices. Please see dedicated report for concomitant chest CT for more complete characterization. 5. Thickening of the epiglottis and aryepiglottic folds is likely secondary to prior radiation treatment. No acute airway compromise.     Electronically Signed   By: Ulyses Jarred M.D.   On: 08/09/2016 16:43  Initial Impression / Assessment and Plan / ED Course  I have reviewed the triage vital signs and the nursing notes.  Pertinent labs & imaging results that were available during my care of the patient were reviewed by me and considered in my medical decision making (see chart for details).     1:56  PM On repeat exam patient is more calm, has mild pain. I discussed all findings including x-ray evidence of multifocal pneumonia with the patient and her sister. Given the patient's recent chemotherapy, though she is not planning to have additional sessions, and her malignancy, as well as persistent pain, she will require admission for further  evaluation and management. I again confirmed goals of care, the patient affirms she would does not want intubation given her progressive malignancy.  Patient will receive vancomycin, cefepime, continued fluids, analgesia.  Final Clinical Impressions(s) / ED Diagnoses  Healthcare acquired pneumonia   Carmin Muskrat, MD 03/06/2017 1357

## 2017-03-12 NOTE — ED Notes (Signed)
Bed: WA17 Expected date:  Expected time:  Means of arrival:  Comments: Hold for RES 

## 2017-03-12 NOTE — ED Notes (Signed)
Hospitalist at bedside 

## 2017-03-13 LAB — CBC WITH DIFFERENTIAL/PLATELET
BASOS ABS: 0 10*3/uL (ref 0.0–0.1)
Basophils Relative: 0 %
Eosinophils Absolute: 0 10*3/uL (ref 0.0–0.7)
Eosinophils Relative: 0 %
HEMATOCRIT: 27.1 % — AB (ref 36.0–46.0)
HEMOGLOBIN: 8.9 g/dL — AB (ref 12.0–15.0)
LYMPHS PCT: 6 %
Lymphs Abs: 1 10*3/uL (ref 0.7–4.0)
MCH: 27.5 pg (ref 26.0–34.0)
MCHC: 32.8 g/dL (ref 30.0–36.0)
MCV: 83.6 fL (ref 78.0–100.0)
MONOS PCT: 6 %
Monocytes Absolute: 1 10*3/uL (ref 0.1–1.0)
NEUTROS ABS: 14.3 10*3/uL — AB (ref 1.7–7.7)
NEUTROS PCT: 88 %
Platelets: 409 10*3/uL — ABNORMAL HIGH (ref 150–400)
RBC: 3.24 MIL/uL — AB (ref 3.87–5.11)
RDW: 18.4 % — ABNORMAL HIGH (ref 11.5–15.5)
WBC: 16.3 10*3/uL — AB (ref 4.0–10.5)

## 2017-03-13 MED ORDER — POLYVINYL ALCOHOL 1.4 % OP SOLN: 1.0000 [drp] | Freq: Four times a day (QID) | OPHTHALMIC | Status: DC | PRN

## 2017-03-13 MED ORDER — HALOPERIDOL LACTATE 5 MG/ML IJ SOLN: 0.5000 mg | INTRAMUSCULAR | Status: DC | PRN

## 2017-03-13 MED ORDER — HALOPERIDOL LACTATE 2 MG/ML PO CONC
0.5000 mg | ORAL | Status: DC | PRN
  Filled 2017-03-13: qty 0.3

## 2017-03-13 MED ORDER — GLYCOPYRROLATE 0.2 MG/ML IJ SOLN
0.1000 mg | Freq: Once | INTRAMUSCULAR | Status: AC
Start: 2017-03-13 — End: 2017-03-13
  Administered 2017-03-13: 0.1 mg via INTRAVENOUS
  Filled 2017-03-13: qty 1

## 2017-03-13 MED ORDER — LORAZEPAM 2 MG/ML IJ SOLN: 0.5000 mg | Freq: Four times a day (QID) | INTRAMUSCULAR | Status: DC

## 2017-03-13 MED ORDER — SODIUM CHLORIDE 0.9 % IV SOLN
0.5000 mg/h | INTRAVENOUS | Status: DC
  Filled 2017-03-13: qty 5

## 2017-03-13 MED ORDER — ONDANSETRON 4 MG PO TBDP: 4.0000 mg | ORAL_TABLET | Freq: Four times a day (QID) | ORAL | Status: DC | PRN

## 2017-03-13 MED ORDER — HALOPERIDOL 0.5 MG PO TABS: 0.5000 mg | ORAL_TABLET | ORAL | Status: DC | PRN

## 2017-03-13 MED ORDER — GLYCOPYRROLATE 0.2 MG/ML IJ SOLN: 0.1000 mg | Freq: Four times a day (QID) | INTRAMUSCULAR | Status: DC

## 2017-03-13 MED ORDER — ONDANSETRON HCL 4 MG/2ML IJ SOLN: 4.0000 mg | Freq: Four times a day (QID) | INTRAMUSCULAR | Status: DC | PRN

## 2017-03-14 ENCOUNTER — Other Ambulatory Visit: Payer: Self-pay | Admitting: Nurse Practitioner

## 2017-04-01 ENCOUNTER — Ambulatory Visit: Payer: Self-pay | Admitting: Hematology and Oncology

## 2017-04-01 NOTE — Progress Notes (Signed)
Called to room to verify patient's expiration.  Patient assessed to have no heart beat or breath sounds.  Second verification completed by Lottie Dawson, RN

## 2017-04-01 NOTE — Progress Notes (Signed)
[facility]  Patient Status: Active (receiving services up to time of hospitalization)  [facility] is providing the following services: RN and MSW  If patient discharges after hours, please call [phone].   Tiffany Yu 2017-03-25, 9:17 AM

## 2017-04-01 NOTE — Discharge Summary (Signed)
Death Summary  Tiffany Yu AJL:[id number] DOB: 1955-08-21 DOA: 03/25/17  PCP: Bernerd Limbo, MD PCP/Office notified:   Admit date: 25-Mar-2017 Date of Death: 03/26/2017  Final Diagnoses:  Active Problems:   HCAP (healthcare-associated pneumonia)       History of present illness:  Patient was admitted on 03-25-2017 with worsening/declining status, started on antibiotics for presumed HCAP. After oncology consult with Dr [name]...  Pt was made comfort Measures ONLY.  Hospital Course:  1)Metastatic Tongue Cancer-please see consult note from Dr. [name], patient expired at 12:55 PM on Mar 26, 2017   Case d/w DR [name] on March 26, 2017  Time: 1255pm on Mar 26, 2017  Signed:  Ulanda Tackett  [facility] 03-26-2017, 1:17 PM

## 2017-04-01 NOTE — Progress Notes (Signed)
Tiffany Yu   DOB:[birth year]   DU#:[id number]    Assessment & Plan:   Recurrent aspiration pneumonia Her clinical picture is compatible with recurrent aspiration pneumonia This is not unexpected given progression of her oropharyngeal disease I agree with broad-spectrum IV antibiotics until family members are ready to transition her to comfort mode  Altered mental status likely due to recurrent aspiration pneumonia This is multifactorial, likely related to infection or sedative side effects from her pain medications Continue supportive care  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  She is not a candidate for further palliative chemotherapy.  Continue supportive care.  We will continue dressing changes and bandages for her necrotic wound discharge under her chin  Cancer associated pain The patient appears somewhat sedated For now, I recommend intermittent IV pain medicine only  Anemia chronic illness She does not need blood transfusion and would not agree for blood transfusion due to her Jehov[employer]'s Witness believes  Hyponatremia and significant electrolyte imbalance Likely secondary to pneumonia. Replace PRN  Protein calorie malnutrition She is on tube feedings at home  Goals of care discussion I have extensive discussion with her husband again this morning. The patient has clearly deteriorated further since my last assessment yesterday evening I highly recommend transitioning of care to comfort mode I would prescribe Robinul to help reduce oral secretion production Her husband will discuss this with the family members and will make final discussion later today I plan to return to check on her again around noon for final plan of care CODE STATUS is DNR with no escalation of care beyond what is being provided at this point  Discharge planning Unlikely to go home.  Likely residential hospice facility once family  members are ready to make decision to transition her care  [name], MD Apr 07, 2017  8:28 AM   Subjective:  The patient has deteriorated significantly.  She is not alert.  She struggled to breathe with excessive wheezing, stridor and saliva production  Objective:  Vitals:   04/07/17 0230 04-07-2017 0412  BP:  107/61  Pulse:  93  Resp:  20  Temp:  (!) 97.5 F (36.4 C)  SpO2: 96% 92%     Intake/Output Summary (Last 24 hours) at 2017-04-07 2376 Last data filed at 03/05/2017 1443 Gross per 24 hour  Intake 100 ml  Output -  Net 100 ml    GENERAL: Not alert, stridor noted, struggling to breathe.  Diffuse wheezes are noted   Labs:  Lab Results  Component Value Date   WBC 16.3 (H) 04-07-2017   HGB 8.9 (L) Apr 07, 2017   HCT 27.1 (L) 2017/04/07   MCV 83.6 07-Apr-2017   PLT 409 (H) Apr 07, 2017   NEUTROABS 14.3 (H) 07-Apr-2017    Lab Results  Component Value Date   NA 126 (L) 03/02/2017   K 4.2 03/26/2017   CL 85 (L) 03/11/2017   CO2 34 (H) 03/06/2017    Studies:  Dg Chest 2 View  Result Date: 03/29/2017 CLINICAL DATA:  Shortness of breath for the past 2-3 days. History of head and neck cancer. EXAM: CHEST - 2 VIEW COMPARISON:  Chest x-ray dated February 09, 2017. FINDINGS: Unchanged right chest wall port catheter with the tip at the cavoatrial junction. The heart size and mediastinal contours are within normal limits. Patchy nodular opacities in the right upper lobe and right lower lobe appear worsened when compared to prior study. Patchy nodular opacities in the left upper  lobe are unchanged. No pleural effusion or pneumothorax. No acute osseous abnormality. IMPRESSION: 1. Worsening patchy nodular opacities in the right upper and lower lobes, which could reflect metastatic disease or multifocal pneumonia. Electronically Signed   By: Titus Dubin M.D.   On: 03/23/2017 12:20

## 2017-04-01 NOTE — Progress Notes (Signed)
Dr. Denton Brick notified of patient's death.  He is on way to department to speak with family.

## 2017-04-01 NOTE — Progress Notes (Signed)
I have another meeting with the husband and family to discuss goals of care. Family is in agreement to transition her to comfort measures. I would discontinue IV antibiotics, IV fluids, vital signs monitoring and nonessential medications I recommend placement of Foley catheter and continue on oxygen therapy Due to history of severe chronic pain from cancer, I will start her on low-dose intravenous Dilaudid in the continuous infusion for pain management.   I will also schedule intermittent dosing of lorazepam for anxiety and agitation I would also schedule intermittent dosing of Robinul to help with reduce oral secretions I will discontinue  tube feedings. I will consult social worker for residential hospice placement I discussed prognosis with family.  I estimated prognosis to be less than 2 weeks, likely days given recent recurrent aspiration pneumonia. I have addressed all the questions and concerns.  I will continue to follow

## 2017-04-01 DEATH — deceased

## 2018-02-26 IMAGING — DX DG CHEST 2V
2 series · 2 of 2 positions shown · non-contrast
Comparison: CT 03/02/2016 .

CLINICAL DATA: Head and neck cancer.

EXAM:
CHEST  2 VIEW

[chest pa]
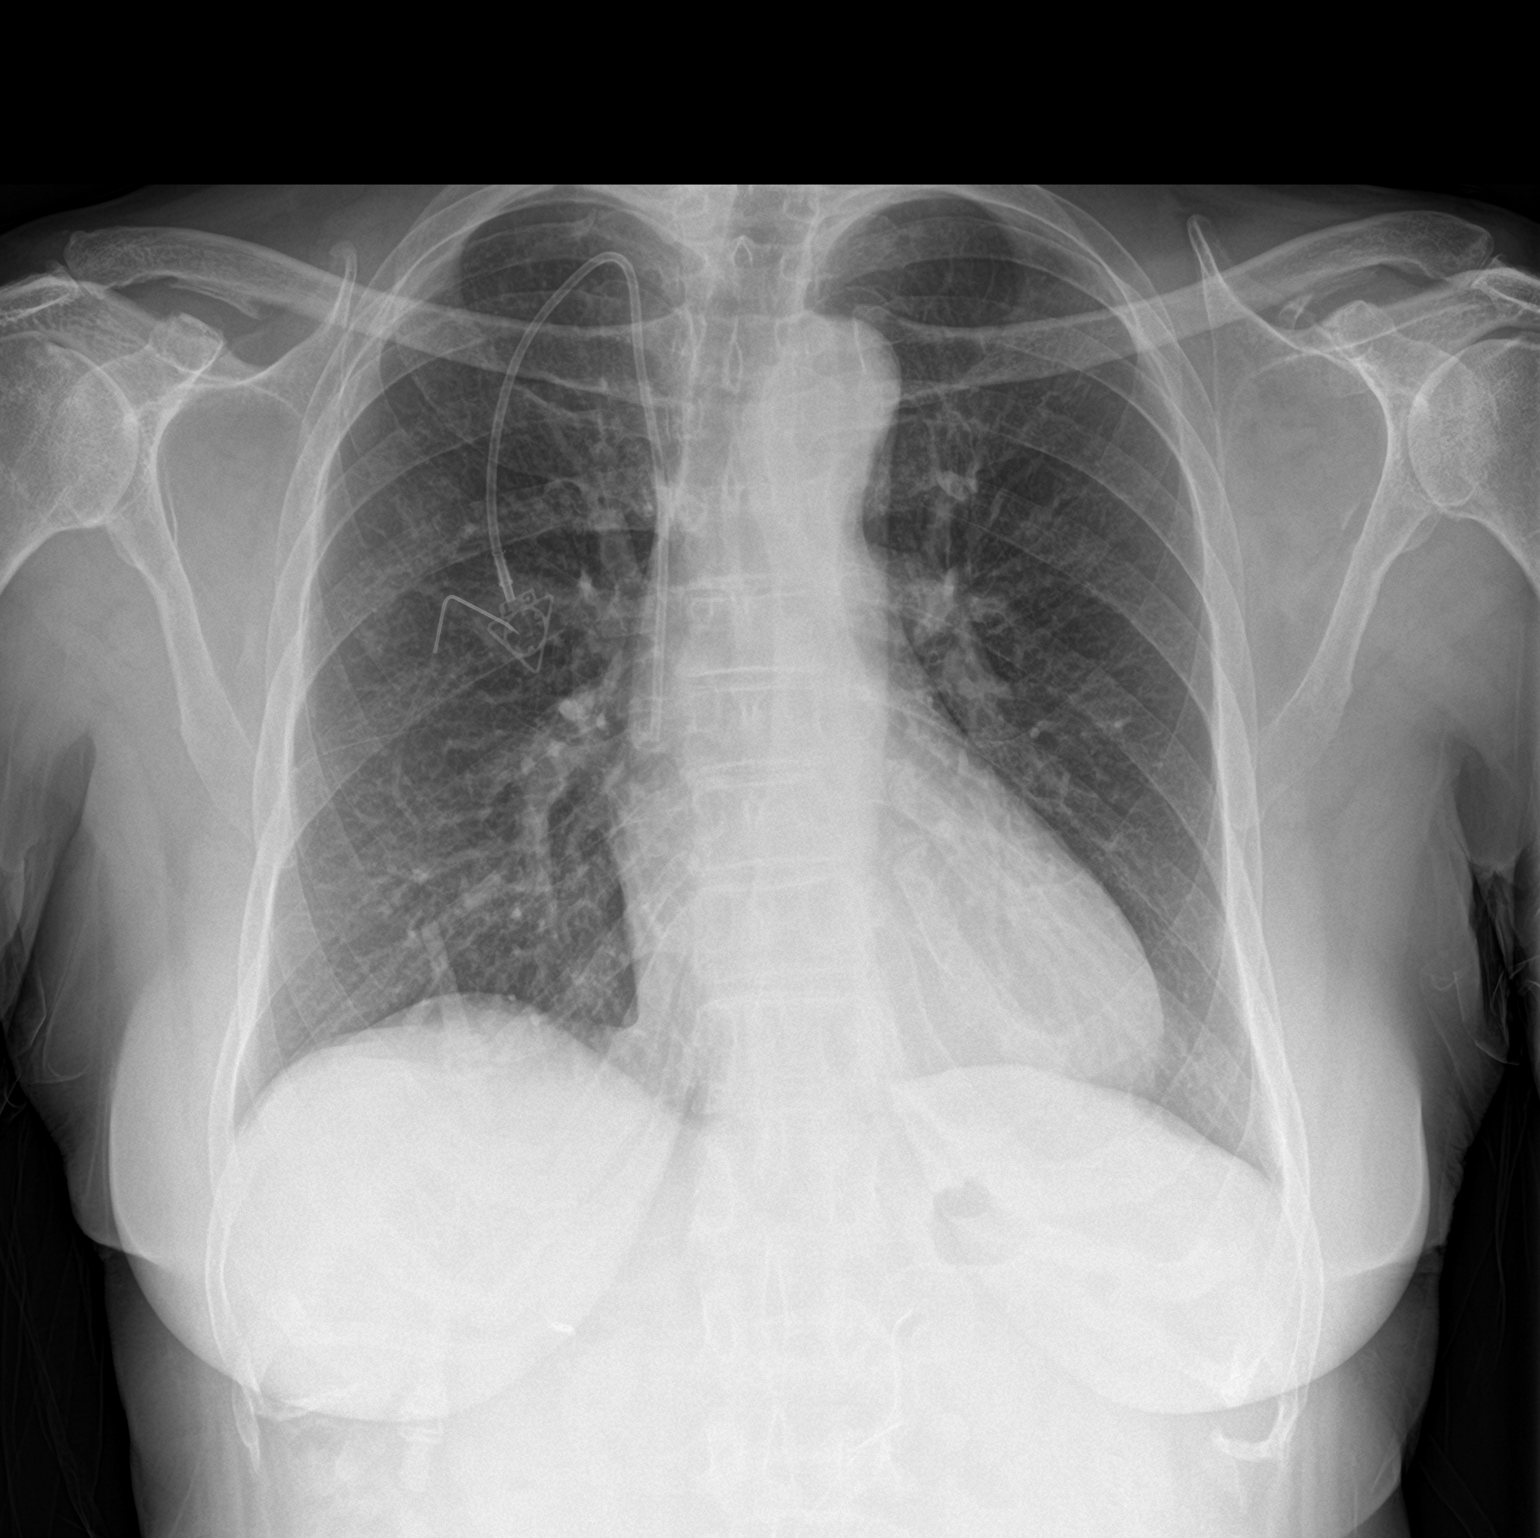

[chest lat]
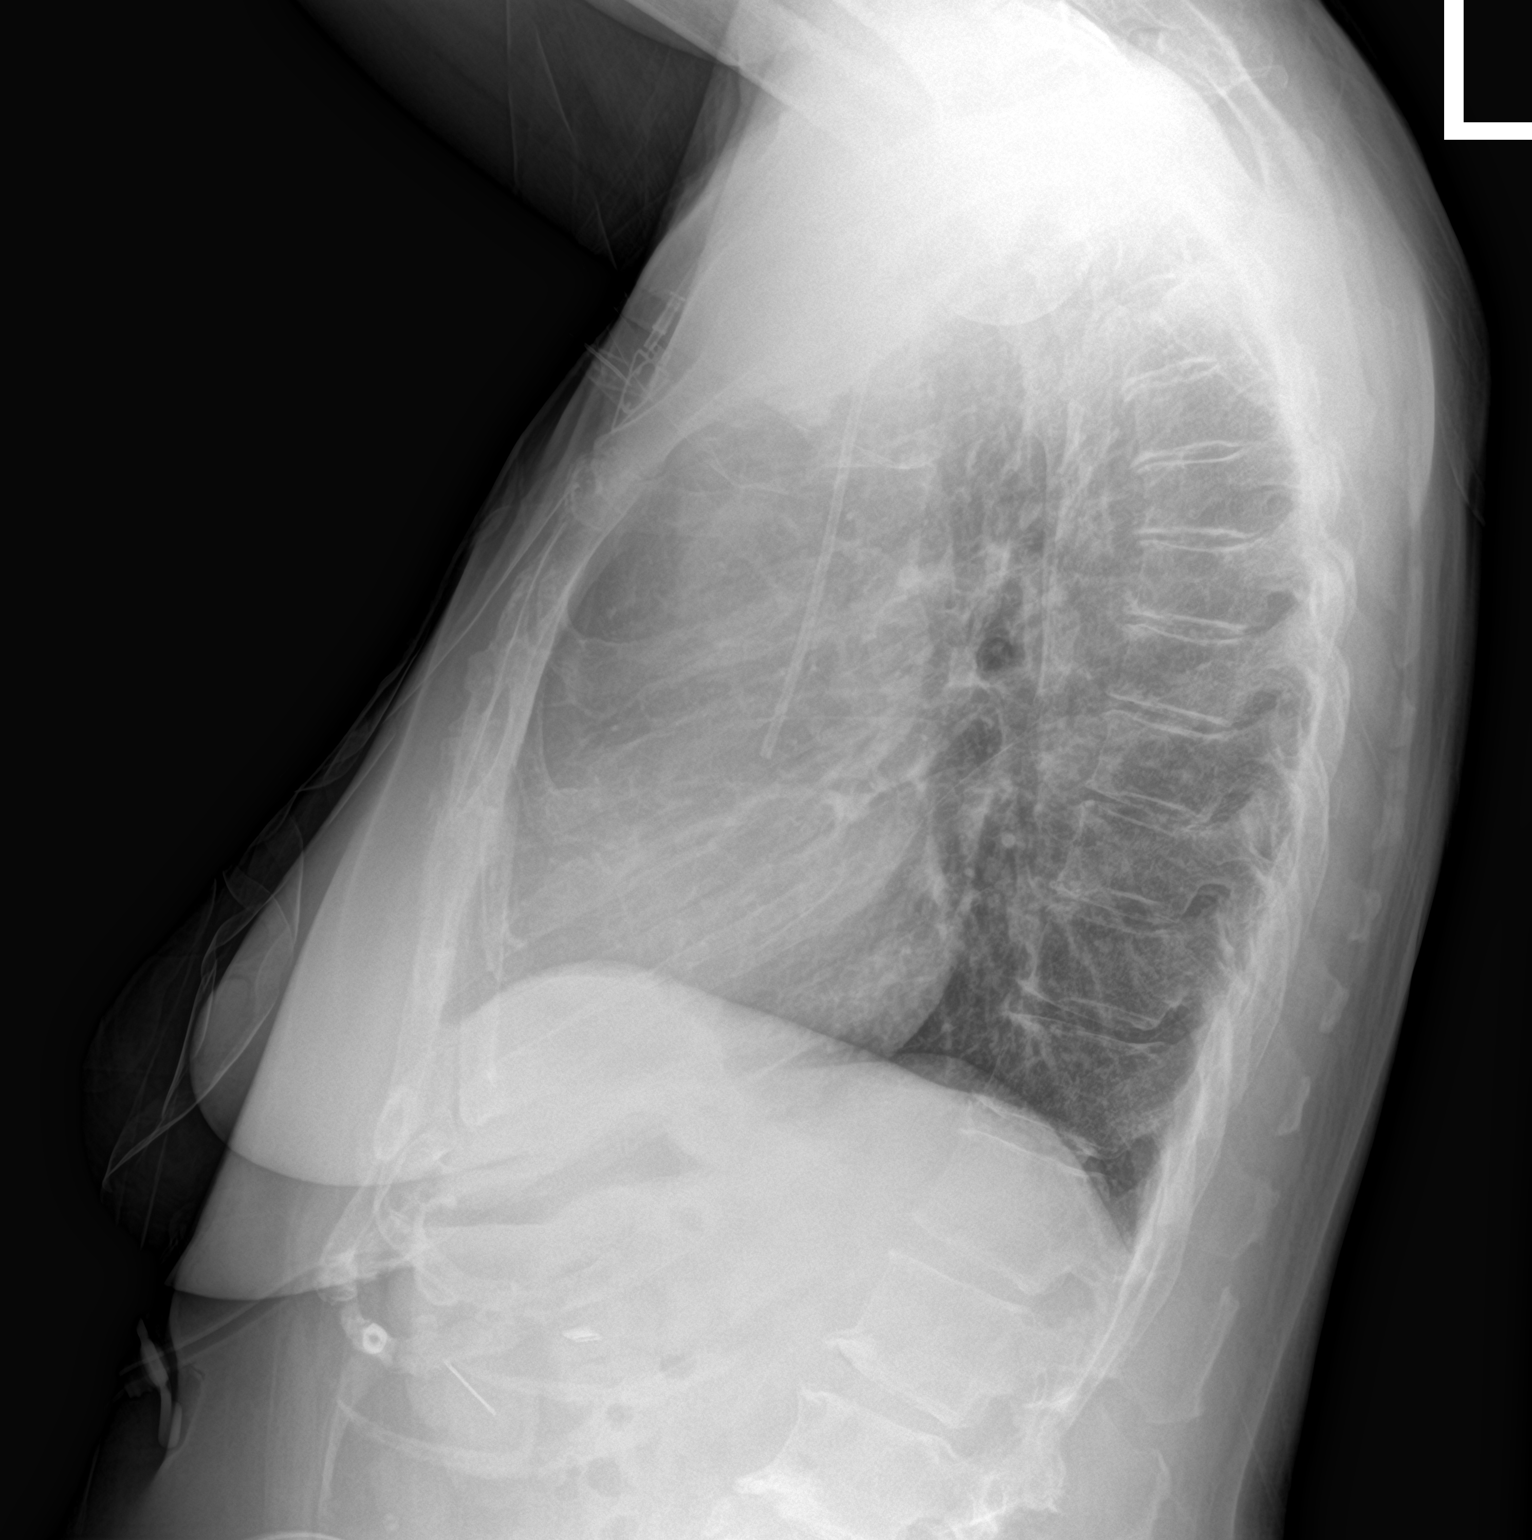

[2 of 2 positions shown; findings below may reference images not displayed]

FINDINGS: PowerPort catheter with lead tip projected over superior vena cava.
Cardiomegaly with normal pulmonary vascularity. No focal infiltrate.
No pleural effusion or pneumothorax. Thoracic spine scoliosis.
IMPRESSION: 1. PowerPort catheter noted with lead tip projected over superior
vena cava.

2. No acute cardiopulmonary disease.

## 2018-03-12 IMAGING — DX DG CHEST 2V
2 series · 2 of 2 positions shown · non-contrast
Comparison: Chest x-ray of May 29, 2016

CLINICAL DATA: Onset of cough yesterday. No history of
cardiopulmonary abnormality. Nonsmoker. History of tongue malignancy

EXAM:
CHEST  2 VIEW

[chest pa]
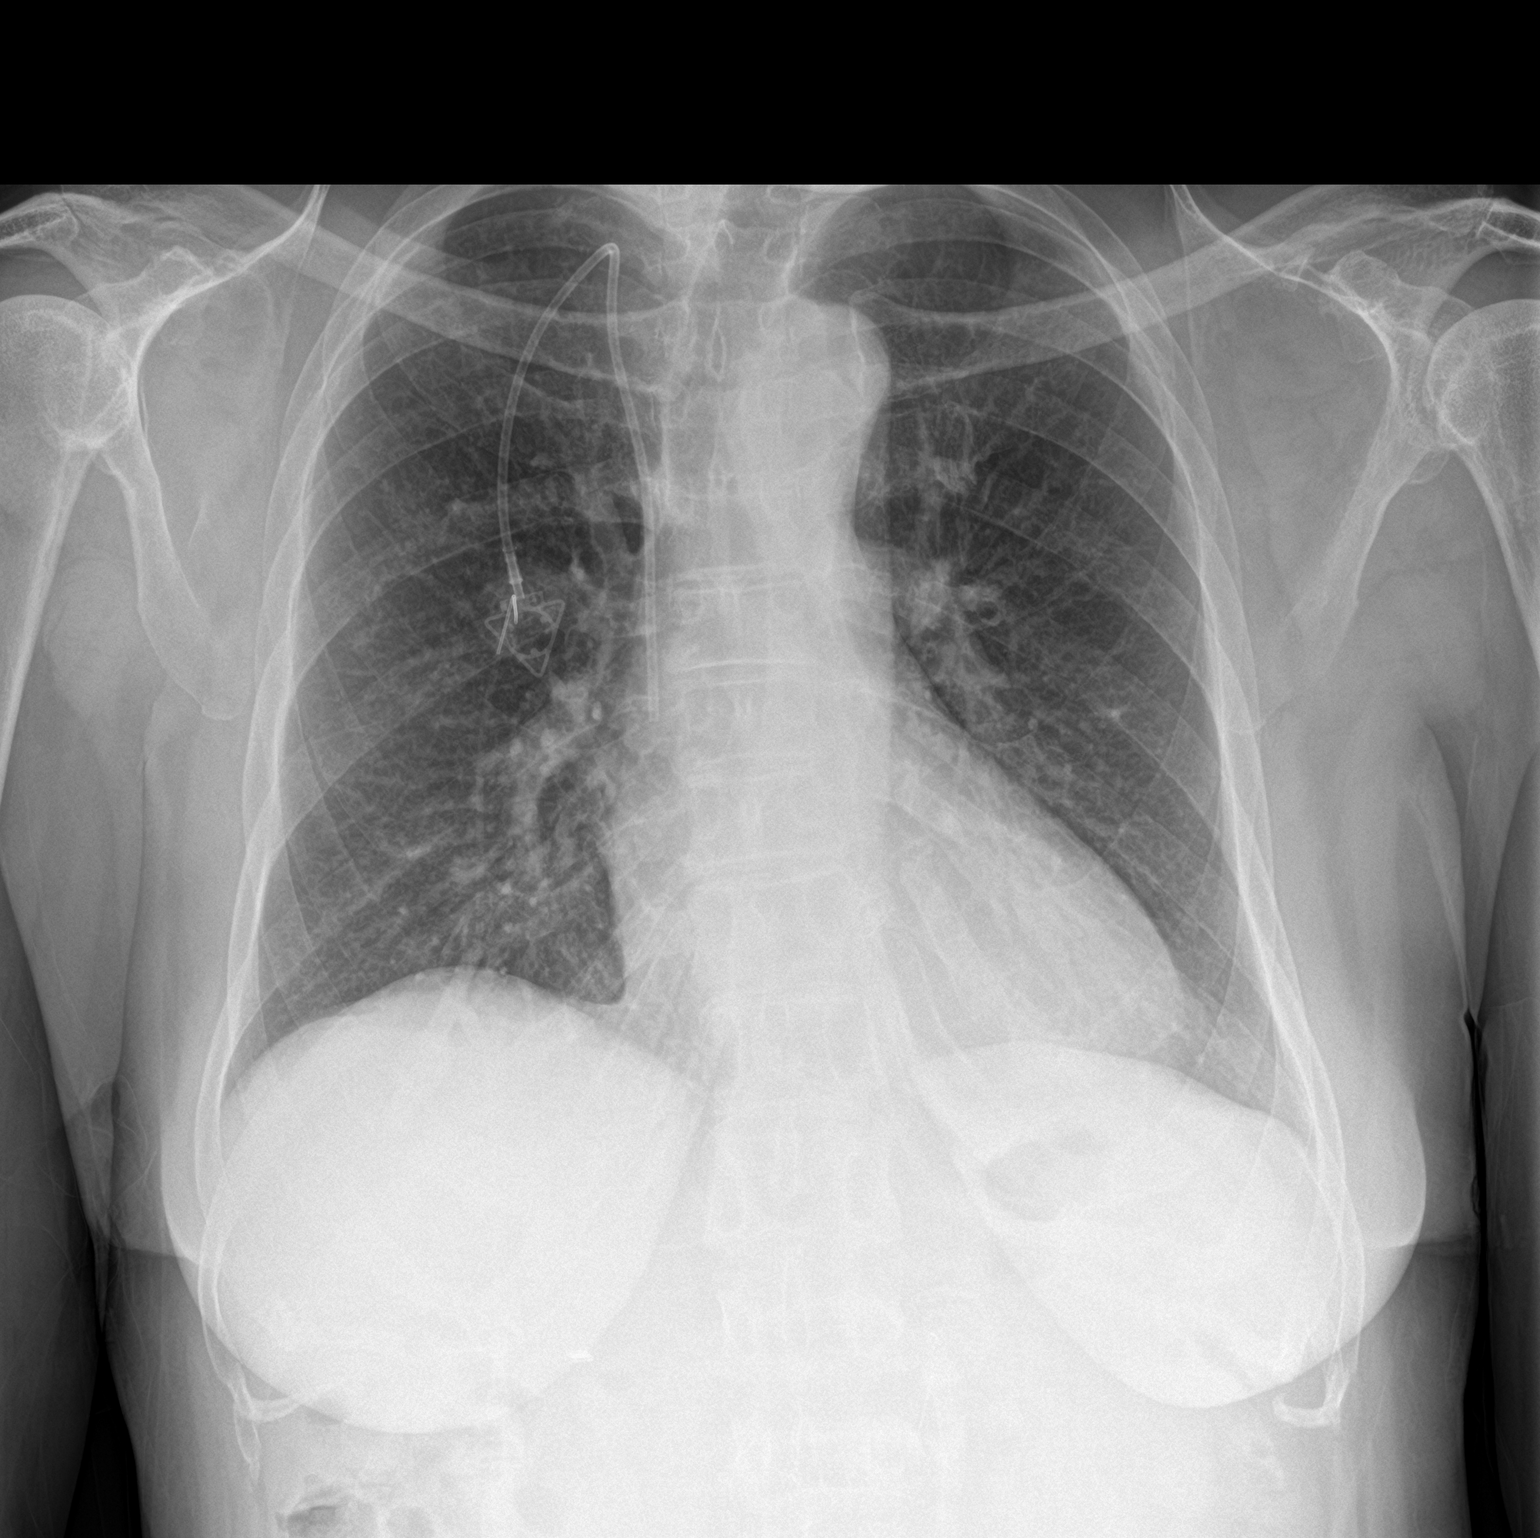

[chest lat]
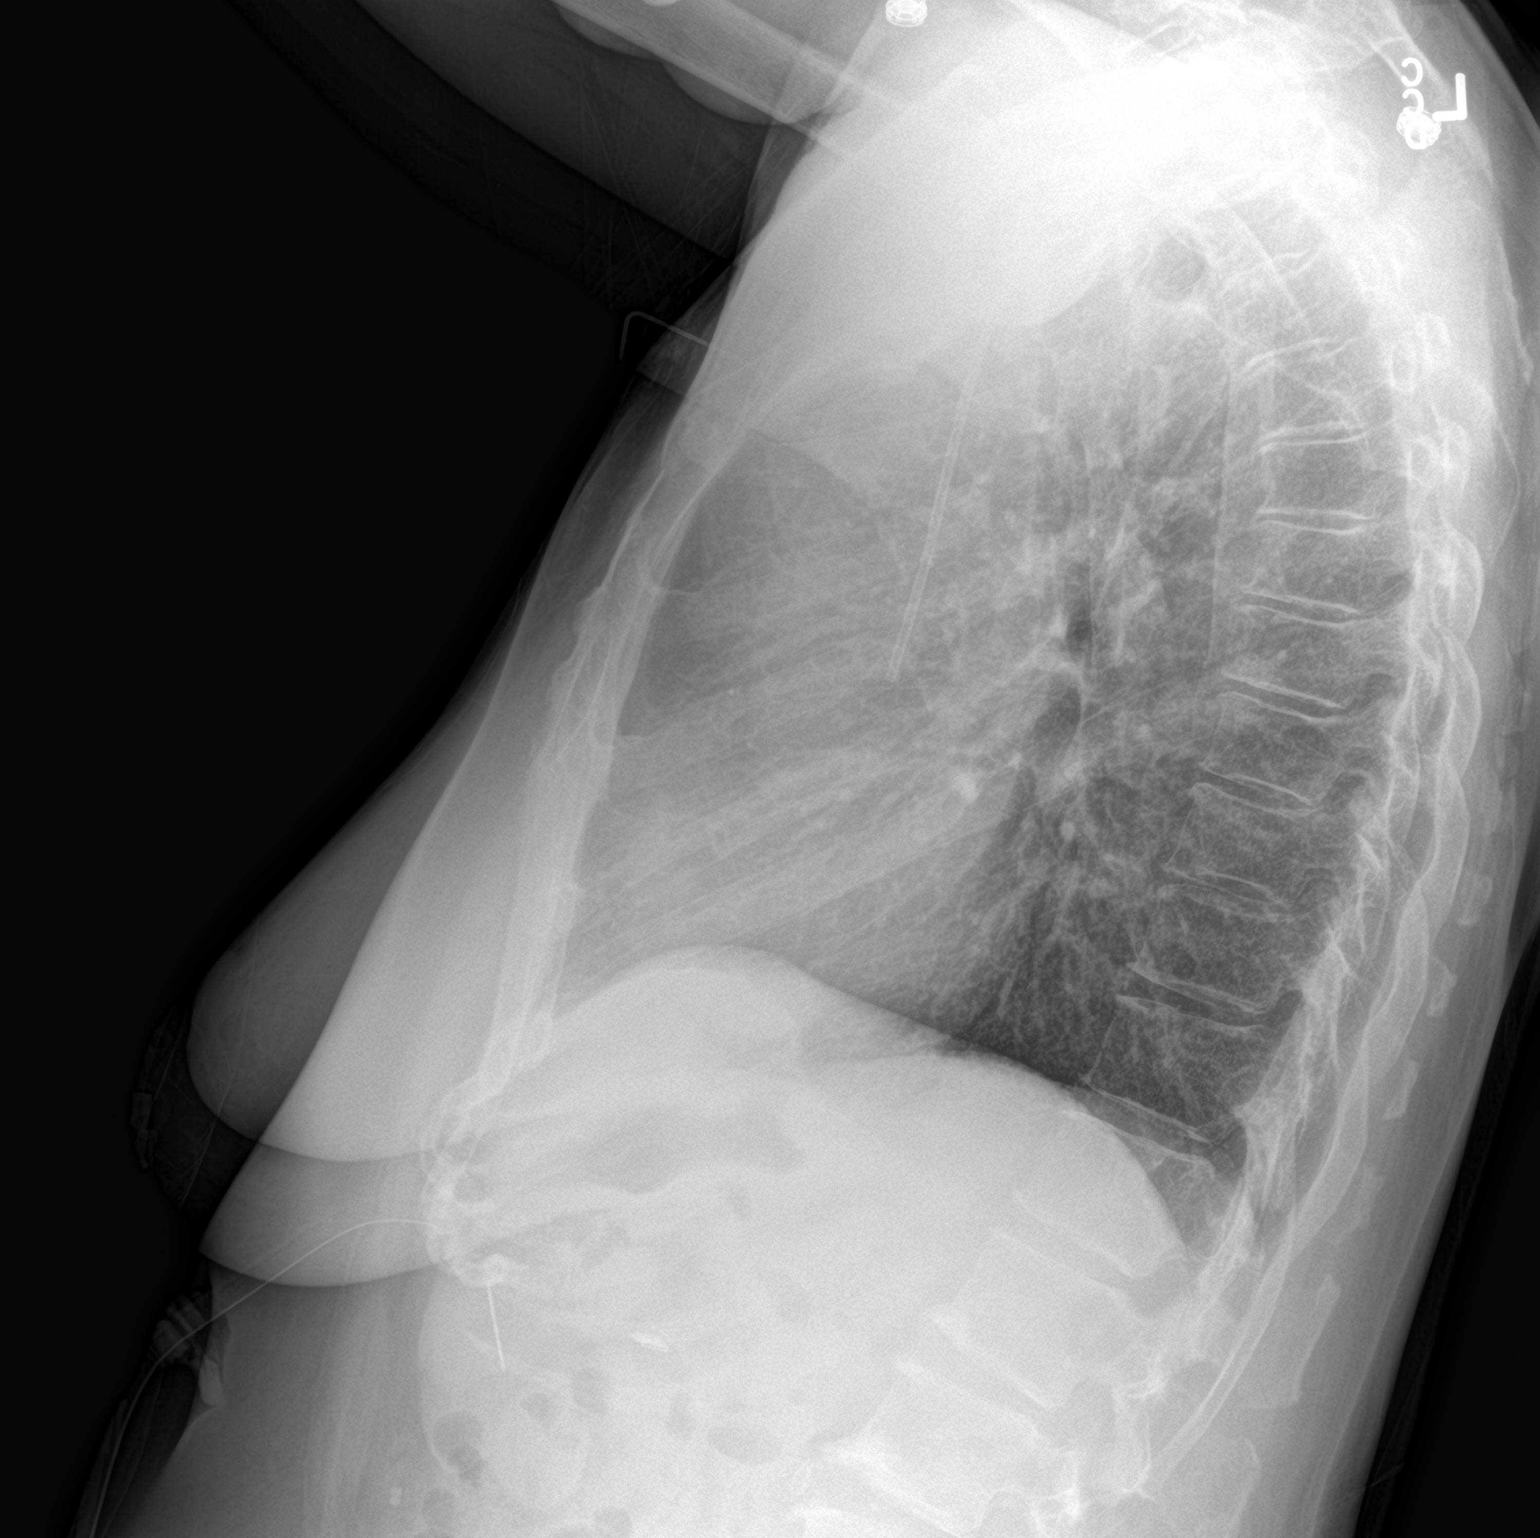

[2 of 2 positions shown; findings below may reference images not displayed]

FINDINGS: Lungs are well-expanded and clear. The heart and pulmonary
vascularity are normal. The mediastinum is normal in width. There is
no pleural effusion. There is calcification in the wall of the
aortic arch. The power port catheter tip projects over the
midportion of the SVC. There is stable dextrocurvature centered in
the upper thoracic spine.
IMPRESSION: There is no pneumonia, CHF, nor other acute cardiopulmonary
abnormality.

Thoracic aortic atherosclerosis.
